# Patient Record
Sex: Male | Born: 1940
Health system: Southern US, Community
[De-identification: ages and names within clinical notes are randomized; demographics above are authoritative.]

## PROBLEM LIST (undated history)

## (undated) DIAGNOSIS — R112 Nausea with vomiting, unspecified: Secondary | ICD-10-CM

## (undated) DIAGNOSIS — J40 Bronchitis, not specified as acute or chronic: Secondary | ICD-10-CM

## (undated) DIAGNOSIS — Z950 Presence of cardiac pacemaker: Secondary | ICD-10-CM

## (undated) DIAGNOSIS — Z9889 Other specified postprocedural states: Secondary | ICD-10-CM

## (undated) DIAGNOSIS — I495 Sick sinus syndrome: Secondary | ICD-10-CM

## (undated) DIAGNOSIS — I2510 Atherosclerotic heart disease of native coronary artery without angina pectoris: Secondary | ICD-10-CM

## (undated) DIAGNOSIS — I480 Paroxysmal atrial fibrillation: Secondary | ICD-10-CM

## (undated) DIAGNOSIS — Z87440 Personal history of urinary (tract) infections: Secondary | ICD-10-CM

## (undated) DIAGNOSIS — K279 Peptic ulcer, site unspecified, unspecified as acute or chronic, without hemorrhage or perforation: Secondary | ICD-10-CM

## (undated) DIAGNOSIS — I2119 ST elevation (STEMI) myocardial infarction involving other coronary artery of inferior wall: Secondary | ICD-10-CM

## (undated) DIAGNOSIS — I429 Cardiomyopathy, unspecified: Secondary | ICD-10-CM

## (undated) DIAGNOSIS — U071 COVID-19: Secondary | ICD-10-CM

## (undated) DIAGNOSIS — I209 Angina pectoris, unspecified: Secondary | ICD-10-CM

## (undated) DIAGNOSIS — J1282 Pneumonia due to coronavirus disease 2019: Secondary | ICD-10-CM

## (undated) DIAGNOSIS — K3532 Acute appendicitis with perforation and localized peritonitis, without abscess: Secondary | ICD-10-CM

## (undated) DIAGNOSIS — I499 Cardiac arrhythmia, unspecified: Secondary | ICD-10-CM

## (undated) DIAGNOSIS — N451 Epididymitis: Secondary | ICD-10-CM

## (undated) HISTORY — DX: COVID-19: U07.1

## (undated) HISTORY — DX: Bronchitis, not specified as acute or chronic: J40

## (undated) HISTORY — DX: Cardiomyopathy, unspecified: I42.9

## (undated) HISTORY — DX: Peptic ulcer, site unspecified, unspecified as acute or chronic, without hemorrhage or perforation: K27.9

## (undated) HISTORY — PX: APPENDECTOMY: SHX54

## (undated) HISTORY — DX: Personal history of urinary (tract) infections: Z87.440

## (undated) HISTORY — DX: Sick sinus syndrome: I49.5

## (undated) HISTORY — DX: Atherosclerotic heart disease of native coronary artery without angina pectoris: I25.10

## (undated) HISTORY — DX: Pneumonia due to coronavirus disease 2019: J12.82

## (undated) HISTORY — PX: INSERT / REPLACE / REMOVE PACEMAKER: SUR710

## (undated) HISTORY — DX: ST elevation (STEMI) myocardial infarction involving other coronary artery of inferior wall: I21.19

## (undated) HISTORY — DX: Paroxysmal atrial fibrillation: I48.0

## (undated) HISTORY — DX: Epididymitis: N45.1

## (undated) HISTORY — DX: Acute appendicitis with perforation, localized peritonitis, and gangrene, without abscess: K35.32

---
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
## 2008-01-04 ENCOUNTER — Ambulatory Visit: Payer: Self-pay | Admitting: Cardiology

## 2008-01-05 ENCOUNTER — Ambulatory Visit: Payer: Self-pay | Admitting: Cardiology

## 2008-01-18 ENCOUNTER — Ambulatory Visit: Payer: Self-pay | Admitting: Cardiology

## 2008-02-20 ENCOUNTER — Ambulatory Visit: Payer: Self-pay | Admitting: Cardiology

## 2008-03-20 ENCOUNTER — Ambulatory Visit: Payer: Self-pay | Admitting: Cardiology

## 2008-03-21 ENCOUNTER — Ambulatory Visit: Payer: Self-pay | Admitting: Internal Medicine

## 2008-03-29 HISTORY — PX: PACEMAKER PLACEMENT: SHX43

## 2008-04-03 ENCOUNTER — Ambulatory Visit (HOSPITAL_COMMUNITY): Admission: RE | Admit: 2008-04-03 | Discharge: 2008-04-03 | Payer: Self-pay | Admitting: Internal Medicine

## 2008-04-03 ENCOUNTER — Ambulatory Visit: Payer: Self-pay | Admitting: Internal Medicine

## 2008-04-15 ENCOUNTER — Ambulatory Visit: Payer: Self-pay

## 2008-06-13 ENCOUNTER — Ambulatory Visit: Payer: Self-pay | Admitting: Cardiology

## 2008-08-22 ENCOUNTER — Ambulatory Visit: Payer: Self-pay | Admitting: Internal Medicine

## 2008-08-30 ENCOUNTER — Ambulatory Visit: Payer: Self-pay | Admitting: Internal Medicine

## 2008-08-30 LAB — CONVERTED CEMR LAB
ALT: 26 units/L (ref 0–53)
AST: 23 units/L (ref 0–37)
Alkaline Phosphatase: 59 units/L (ref 39–117)
Bilirubin, Direct: 0.2 mg/dL (ref 0.0–0.3)
HDL: 30.9 mg/dL — ABNORMAL LOW (ref >39.0)
Total Bilirubin: 1 mg/dL (ref 0.3–1.2)

## 2009-01-28 ENCOUNTER — Ambulatory Visit: Payer: Self-pay | Admitting: Internal Medicine

## 2009-03-10 ENCOUNTER — Encounter: Payer: Self-pay | Admitting: Internal Medicine

## 2009-03-10 ENCOUNTER — Ambulatory Visit: Payer: Self-pay | Admitting: Internal Medicine

## 2009-03-10 DIAGNOSIS — I480 Paroxysmal atrial fibrillation: Secondary | ICD-10-CM | POA: Insufficient documentation

## 2009-03-10 DIAGNOSIS — G4730 Sleep apnea, unspecified: Secondary | ICD-10-CM

## 2009-03-10 DIAGNOSIS — G4710 Hypersomnia, unspecified: Secondary | ICD-10-CM | POA: Insufficient documentation

## 2009-03-11 LAB — CONVERTED CEMR LAB
BUN: 17 mg/dL (ref 6–23)
Basophils Absolute: 0 10*3/uL (ref 0.0–0.1)
CO2: 29 meq/L (ref 19–32)
Calcium: 9.4 mg/dL (ref 8.4–10.5)
Eosinophils Absolute: 0.4 10*3/uL (ref 0.0–0.7)
Glucose, Bld: 90 mg/dL (ref 70–99)
Hemoglobin: 16.3 g/dL (ref 13.0–17.0)
Lymphocytes Relative: 20 % (ref 12.0–46.0)
Lymphs Abs: 1.6 10*3/uL (ref 0.7–4.0)
MCHC: 34.3 g/dL (ref 30.0–36.0)
Neutro Abs: 5.5 10*3/uL (ref 1.4–7.7)
Platelets: 179 10*3/uL (ref 150.0–400.0)
RDW: 13 % (ref 11.5–14.6)
Sodium: 142 meq/L (ref 135–145)
TSH: 1.22 microintl units/mL (ref 0.35–5.50)

## 2009-03-12 ENCOUNTER — Ambulatory Visit: Payer: Self-pay | Admitting: Cardiology

## 2009-03-21 ENCOUNTER — Encounter (INDEPENDENT_AMBULATORY_CARE_PROVIDER_SITE_OTHER): Payer: Self-pay | Admitting: Radiology

## 2009-04-07 ENCOUNTER — Telehealth: Payer: Self-pay | Admitting: Internal Medicine

## 2009-04-29 ENCOUNTER — Encounter (INDEPENDENT_AMBULATORY_CARE_PROVIDER_SITE_OTHER): Payer: Self-pay | Admitting: *Deleted

## 2009-04-29 ENCOUNTER — Inpatient Hospital Stay (HOSPITAL_COMMUNITY): Admission: EM | Admit: 2009-04-29 | Discharge: 2009-05-07 | Payer: Self-pay | Admitting: Internal Medicine

## 2009-04-29 ENCOUNTER — Ambulatory Visit: Payer: Self-pay | Admitting: *Deleted

## 2009-04-30 ENCOUNTER — Encounter: Payer: Self-pay | Admitting: Internal Medicine

## 2009-05-04 ENCOUNTER — Encounter: Payer: Self-pay | Admitting: Internal Medicine

## 2009-05-06 DIAGNOSIS — Z91038 Other insect allergy status: Secondary | ICD-10-CM | POA: Insufficient documentation

## 2009-05-19 ENCOUNTER — Encounter: Payer: Self-pay | Admitting: Internal Medicine

## 2009-05-20 ENCOUNTER — Ambulatory Visit: Payer: Self-pay | Admitting: Internal Medicine

## 2009-05-20 DIAGNOSIS — R002 Palpitations: Secondary | ICD-10-CM | POA: Insufficient documentation

## 2009-05-23 ENCOUNTER — Encounter: Payer: Self-pay | Admitting: Internal Medicine

## 2009-07-05 ENCOUNTER — Encounter: Payer: Self-pay | Admitting: Internal Medicine

## 2009-07-07 ENCOUNTER — Ambulatory Visit: Payer: Self-pay | Admitting: Internal Medicine

## 2009-07-07 ENCOUNTER — Encounter: Payer: Self-pay | Admitting: Internal Medicine

## 2009-07-09 LAB — CONVERTED CEMR LAB
Albumin: 4 g/dL (ref 3.5–5.2)
Bilirubin, Direct: 0.2 mg/dL (ref 0.0–0.3)
Total Protein: 7.3 g/dL (ref 6.0–8.3)

## 2009-10-14 ENCOUNTER — Ambulatory Visit: Payer: Self-pay | Admitting: Internal Medicine

## 2009-10-14 DIAGNOSIS — I495 Sick sinus syndrome: Secondary | ICD-10-CM | POA: Insufficient documentation

## 2009-10-15 LAB — CONVERTED CEMR LAB
TSH: 1.19 microintl units/mL (ref 0.35–5.50)
Total Bilirubin: 1.2 mg/dL (ref 0.3–1.2)

## 2010-04-02 ENCOUNTER — Ambulatory Visit: Payer: Self-pay

## 2010-04-02 ENCOUNTER — Encounter: Payer: Self-pay | Admitting: Internal Medicine

## 2010-06-05 ENCOUNTER — Telehealth: Payer: Self-pay | Admitting: Internal Medicine

## 2010-07-30 ENCOUNTER — Encounter: Payer: Self-pay | Admitting: Cardiology

## 2010-08-07 ENCOUNTER — Telehealth: Payer: Self-pay | Admitting: Internal Medicine

## 2010-08-19 ENCOUNTER — Ambulatory Visit (HOSPITAL_COMMUNITY): Admission: RE | Admit: 2010-08-19 | Discharge: 2010-08-19 | Payer: Self-pay | Admitting: Family Medicine

## 2010-10-16 ENCOUNTER — Encounter: Payer: Self-pay | Admitting: Internal Medicine

## 2010-10-16 ENCOUNTER — Ambulatory Visit: Payer: Self-pay | Admitting: Internal Medicine

## 2010-12-29 NOTE — Cardiovascular Report (Signed)
Summary: [facility]/ [facility] SUMMARY  [facility]/ [facility] SUMMARY   Imported By: Dorise Hiss 10/21/2010 12:22:29  _____________________________________________________________________  External Attachment:    Type:   Image     Comment:   External Document

## 2010-12-29 NOTE — Procedures (Signed)
Summary: 6 MO F/U   Current Medications (verified): 1)  Aspirin 81 Mg Tbec (Aspirin) .... Take 2 Tablets By Mouth Daily 2)  Fish Oil Concentrate 1000 Mg Caps (Omega-3 Fatty Acids) .Marland Kitchen.. 1 Once Daily 3)  Metoprolol Tartrate 25 Mg Tabs (Metoprolol Tartrate) .... Take One Tablet By Mouth Twice A Day 4)  Amiodarone Hcl 200 Mg Tabs (Amiodarone Hcl) .... Take Once Daily 5)  Cetirizine Hcl 10 Mg Tabs (Cetirizine Hcl) .... One By Mouth Daily  Allergies (verified): 1)  ! Codeine 2)  * Bee Sting   PPM Specifications Following MD:  [name], MD     PPM Vendor:  St Jude     PPM Model Number:  [id number]     PPM Serial Number:  [id number] PPM DOI:  04/03/2008     PPM Implanting MD:  [name], MD  Lead 1    Location: RA     DOI: 10/19/1999     Model #: [id number]     Serial #: [id number]     Status: active Lead 2    Location: RV     DOI: 10/19/1999     Model #: [id number]     Serial #: [id number]     Status: active  Magnet Response Rate:  BOL 98.6 ERI  86.3  Indications:  TACHY-BRADY SYNDROME   PPM Follow Up Remote Check?  No Battery Voltage:  2.79 V     Battery Est. Longevity:  4.25 years     Pacer Dependent:  No       PPM Device Measurements Atrium  Amplitude: 4.7 mV, Impedance: 416 ohms, Threshold: 0.75 V at 0.4 msec Right Ventricle  Amplitude: 10 mV, Impedance: 379 ohms, Threshold: 1.0 V at 0.4 msec  Episodes MS Episodes:  226     Percent Mode Switch:  4.8%     Coumadin:  No Atrial Pacing:  99%     Ventricular Pacing:  <1%  Parameters Mode:  DDDR     Lower Rate Limit:  75     Upper Rate Limit:  110 Paced AV Delay:  250     Sensed AV Delay:  250 Next Cardiology Appt Due:  09/29/2010 Tech Comments:  There were 226 mode switch for a total of 4.8%, the longest 6:41 hours, he is not on coumadin.  I turned is EGM storage on for mode switch so we can look at these episodes in more detail at his next visit.  He will also start to follow in the [place] office instead of here in [place].   Dr. Johney Frame will see him  back in 6 months. Altha Harm, LPN  Apr 03, 1307 10:21 AM

## 2010-12-29 NOTE — Progress Notes (Signed)
Summary: pt wants chest x-ray  Phone Note Call from Patient Call back at [phone]   Caller: Patient Reason for Call: Talk to Nurse, Talk to Doctor Summary of Call: pt has a Rx from his pcp office for a x-ray due to chest pain and wants to come here to have it done. Initial call taken by: Omer Jack,  August 07, 2010 9:31 AM  Follow-up for Phone Call        Told pt he could not have the XRAY here since the order was from his primary MD so to schedule his XRAY with [facility] in [place]. Said he was feeling dizzy and so I advised him to take his BP/HR and call us back if the results are abnomal. Made his f/u appt. with Dr. Johney Frame in [place] for Nov.18 @ 3:30pm.

## 2010-12-29 NOTE — Progress Notes (Signed)
Summary: med question/appt  Phone Note Call from Patient Call back at [phone]   Caller: Patient Reason for Call: Talk to Nurse Summary of Call: request to speak to nurse about refill on Metoprolol.... request to be seen, Dr Graciela Husbands does not have appt till 8/23, pt wants to be seen before then, his meds will run out  Initial call taken by: Migdalia Dk,  June 05, 2010 11:01 AM  Follow-up for Phone Call        has been feeling bad since 1st of June, has had some shooting pain down the left side around his breast, last a couple of sec, no pattern to when it comes, more fatigued than normal, pt states he has fired Dr Andee Lineman and only sees Dr Graciela Husbands now, Dr Graciela Husbands is ok to refill metoprolol, new rx sent in pt is agreeable to f/u w/pcp for other symptoms  Follow-up by: Meredith Staggers, RN,  June 05, 2010 11:37 AM    Prescriptions: METOPROLOL TARTRATE 25 MG TABS (METOPROLOL TARTRATE) Take one tablet by mouth twice a day  #60 x 12   Entered by:   Meredith Staggers, RN   Authorized by:   Nathen May, MD, [facility]   Signed by:   Meredith Staggers, RN on 06/05/2010   Method used:   Electronically to        [facility]* (retail)       [address]       [place], [place]  [address]       Ph: [phone]       Fax: [phone]   RxID:   [id number]

## 2010-12-29 NOTE — Assessment & Plan Note (Signed)
Summary: f68m/kl   Visit Type:  Follow-up Referring Provider:  DeGent Primary Provider:  Farrell Ours   History of Present Illness: The patient presents today for routine electrophysiology followup. He reports doing very well since last being seen in our clinic.   He is unware of episodes of afib.  The patient denies symptoms of palpitations, chest pain, shortness of breath, orthopnea, PND, lower extremity edema, dizziness, presyncope, syncope, or neurologic sequela. The patient is tolerating medications without difficulties and is otherwise without complaint today.   Preventive Screening-Counseling & Management  Alcohol-Tobacco     Smoking Status: quit     Year Quit: 11/30/1977  Current Medications (verified): 1)  Aspirin 81 Mg Tbec (Aspirin) .... Take 2 Tablets By Mouth Daily 2)  Fish Oil Concentrate 1000 Mg Caps (Omega-3 Fatty Acids) .Marland Kitchen.. 1 Once Daily 3)  Metoprolol Tartrate 25 Mg Tabs (Metoprolol Tartrate) .... Take One Tablet By Mouth Twice A Day 4)  Amiodarone Hcl 200 Mg Tabs (Amiodarone Hcl) .... Take Once Daily 5)  Multivitamins  Tabs (Multiple Vitamin) .... Take 1 Tablet By Mouth Once A Day  Allergies (verified): 1)  ! Codeine 2)  * Bee Sting  Comments:  Nurse/Medical Assistant: The patient's medication bottles and allergies were reviewed with the patient and were updated in the Medication and Allergy Lists.  Past History:  Past Medical History: Reviewed history from 05/06/2009 and no changes required.  PAST MEDICAL HISTORY:   1. 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 Jude, [place]. 5826  2. 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   3. Asthma.   4. Bronchitis history.   5. Peptic ulcer disease.   6. History of recurrent UTIs.   7. History of ruptured appendix.   8. History of acute epididymitis.   Past Surgical History: Reviewed history from 05/06/2009 and no changes required. Pacemaker implantation  Social  History: Reviewed history from 05/06/2009 and no changes required. Divorced  Tobacco Use - Former. --1979 Alcohol Use - no  Vital Signs:  Patient profile:   70 year old male Height:      71 inches Weight:      210 pounds BMI:     29.39 Pulse rate:   75 / minute BP sitting:   117 / 81  (left arm) Cuff size:   large  Vitals Entered By: Carlye Grippe (October 16, 2010 3:27 PM)  Nutrition Counseling: Patient's BMI is greater than 25 and therefore counseled on weight management options.  Physical Exam  General:  Well developed, well nourished, in no acute distress. Head:  normocephalic and atraumatic Eyes:  PERRLA/EOM intact; conjunctiva and lids normal. Mouth:  Teeth, gums and palate normal. Oral mucosa normal. Neck:  supple Lungs:  Clear bilaterally to auscultation and percussion. Heart:  RRR, no m/r/g Abdomen:  Bowel sounds positive; abdomen soft and non-tender without masses, organomegaly, or hernias noted. No hepatosplenomegaly. Msk:  Back normal, normal gait. Muscle strength and tone normal. Pulses:  pulses normal in all 4 extremities Extremities:  No clubbing or cyanosis. Neurologic:  Alert and oriented x 3.   PPM Specifications Following MD:  [name], MD     PPM Vendor:  St Jude     PPM Model Number:  [id number]     PPM Serial Number:  [id number] PPM DOI:  04/03/2008     PPM Implanting MD:  [name], MD  Lead 1    Location: RA  DOI: 10/19/1999     Model #: 1388TC     Serial #: [id number]     Status: active Lead 2    Location: RV     DOI: 10/19/1999     Model #: [id number]     Serial #: [id number]     Status: active  Magnet Response Rate:  BOL 98.6 ERI  86.3  Indications:  TACHY-BRADY SYNDROME   PPM Follow Up Battery Voltage:  2.79 V     Battery Est. Longevity:  3-4.25 YRS     Pacer Dependent:  No       PPM Device Measurements Atrium  Amplitude: 5.0 mV, Impedance: 381 ohms, Threshold: 0.75 V at 0.4 msec Right Ventricle  Amplitude: 10.3 mV, Impedance: 379 ohms,  Threshold: 1.125 V at 0.4 msec  Episodes MS Episodes:  676     Percent Mode Switch:  8.4%     Coumadin:  No Ventricular High Rate:  4     Atrial Pacing:  99%     Ventricular Pacing:  <1%  Parameters Mode:  DDDR     Lower Rate Limit:  75     Upper Rate Limit:  110 Paced AV Delay:  250     Sensed AV Delay:  250 Next Cardiology Appt Due:  03/30/2011 Tech Comments:  676 AMS EPISODES--LONGEST WAS 7 HRS 22 MINUTES.  NORMAL DEVICE FUNCTION. NO CHANGES MADE. ROV IN 6 MTHS W/DEVICE CLINIC. Vella Kohler  October 16, 2010 3:42 PM MD Comments:  agree  Impression & Recommendations:  Problem # 1:  ATRIAL FIBRILLATION (ICD-427.31) well controlled with amiodarone He is instructed to have TFTs and LFTs every 6 months by Dr Margo Common (he declines labs today stating he has had recent labs by Dr Margo Common). CXR 9/11 was stable  His CHADS2 score is 0.  He will continue ASA for stroke prevention.  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

## 2011-01-01 NOTE — Cardiovascular Report (Signed)
Summary: Office Visit   Office Visit   Imported By: Roderic Ovens 04/14/2010 12:01:49  _____________________________________________________________________  External Attachment:    Type:   Image     Comment:   External Document

## 2011-03-08 ENCOUNTER — Other Ambulatory Visit: Payer: Self-pay | Admitting: Internal Medicine

## 2011-03-08 LAB — URINALYSIS, ROUTINE W REFLEX MICROSCOPIC
Bilirubin Urine: NEGATIVE
Ketones, ur: 15 mg/dL — AB
Protein, ur: NEGATIVE mg/dL
Urobilinogen, UA: 1 mg/dL (ref 0.0–1.0)

## 2011-03-08 LAB — CBC
HCT: 38.7 % — ABNORMAL LOW (ref 39.0–52.0)
HCT: 40.2 % (ref 39.0–52.0)
HCT: 40.8 % (ref 39.0–52.0)
Hemoglobin: 13.3 g/dL (ref 13.0–17.0)
Hemoglobin: 13.4 g/dL (ref 13.0–17.0)
Hemoglobin: 13.9 g/dL (ref 13.0–17.0)
Hemoglobin: 14 g/dL (ref 13.0–17.0)
Hemoglobin: 14.1 g/dL (ref 13.0–17.0)
MCHC: 34.2 g/dL (ref 30.0–36.0)
MCHC: 34.3 g/dL (ref 30.0–36.0)
MCHC: 34.4 g/dL (ref 30.0–36.0)
MCHC: 34.6 g/dL (ref 30.0–36.0)
MCV: 87.8 fL (ref 78.0–100.0)
MCV: 88.4 fL (ref 78.0–100.0)
MCV: 88.9 fL (ref 78.0–100.0)
MCV: 90.5 fL (ref 78.0–100.0)
Platelets: 133 10*3/uL — ABNORMAL LOW (ref 150–400)
Platelets: 210 10*3/uL (ref 150–400)
Platelets: 266 10*3/uL (ref 150–400)
RBC: 4.36 MIL/uL (ref 4.22–5.81)
RBC: 4.38 MIL/uL (ref 4.22–5.81)
RBC: 4.4 MIL/uL (ref 4.22–5.81)
RBC: 4.45 MIL/uL (ref 4.22–5.81)
RBC: 4.59 MIL/uL (ref 4.22–5.81)
RBC: 4.61 MIL/uL (ref 4.22–5.81)
RBC: 4.64 MIL/uL (ref 4.22–5.81)
RDW: 14.1 % (ref 11.5–15.5)
RDW: 14.1 % (ref 11.5–15.5)
WBC: 10.6 10*3/uL — ABNORMAL HIGH (ref 4.0–10.5)
WBC: 16.4 10*3/uL — ABNORMAL HIGH (ref 4.0–10.5)
WBC: 5.6 10*3/uL (ref 4.0–10.5)
WBC: 6.4 10*3/uL (ref 4.0–10.5)
WBC: 7.2 10*3/uL (ref 4.0–10.5)

## 2011-03-08 LAB — URINE CULTURE
Colony Count: NO GROWTH
Culture: NO GROWTH

## 2011-03-08 LAB — LIPID PANEL: Triglycerides: 56 mg/dL (ref <150)

## 2011-03-08 LAB — CARDIAC PANEL(CRET KIN+CKTOT+MB+TROPI)
Total CK: 267 U/L — ABNORMAL HIGH (ref 7–232)
Troponin I: 0.05 ng/mL (ref 0.00–0.06)

## 2011-03-08 LAB — HEPARIN LEVEL (UNFRACTIONATED)
Heparin Unfractionated: 0.17 IU/mL — ABNORMAL LOW (ref 0.30–0.70)
Heparin Unfractionated: 0.23 IU/mL — ABNORMAL LOW (ref 0.30–0.70)
Heparin Unfractionated: 0.46 IU/mL (ref 0.30–0.70)
Heparin Unfractionated: 0.56 IU/mL (ref 0.30–0.70)
Heparin Unfractionated: 0.59 IU/mL (ref 0.30–0.70)
Heparin Unfractionated: 0.64 IU/mL (ref 0.30–0.70)
Heparin Unfractionated: 0.82 IU/mL — ABNORMAL HIGH (ref 0.30–0.70)

## 2011-03-08 LAB — PSA: PSA: 43.2 ng/mL — ABNORMAL HIGH (ref 0.10–4.00)

## 2011-03-08 LAB — BASIC METABOLIC PANEL
BUN: 9 mg/dL (ref 6–23)
Chloride: 109 mEq/L (ref 96–112)
Creatinine, Ser: 0.97 mg/dL (ref 0.4–1.5)
Creatinine, Ser: 1.29 mg/dL (ref 0.4–1.5)
GFR calc Af Amer: >60 mL/min (ref >60)
GFR calc Af Amer: >60 mL/min (ref >60)
GFR calc non Af Amer: >60 mL/min (ref >60)
Potassium: 3.8 mEq/L (ref 3.5–5.1)
Potassium: 3.8 mEq/L (ref 3.5–5.1)

## 2011-03-08 LAB — URINE MICROSCOPIC-ADD ON

## 2011-03-08 LAB — GLUCOSE, CAPILLARY: Glucose-Capillary: 113 mg/dL — ABNORMAL HIGH (ref 70–99)

## 2011-03-08 LAB — TSH: TSH: 2.118 u[IU]/mL (ref 0.350–4.500)

## 2011-04-09 ENCOUNTER — Ambulatory Visit (INDEPENDENT_AMBULATORY_CARE_PROVIDER_SITE_OTHER): Payer: Medicare PPO | Admitting: *Deleted

## 2011-04-09 DIAGNOSIS — I495 Sick sinus syndrome: Secondary | ICD-10-CM

## 2011-04-09 DIAGNOSIS — I4891 Unspecified atrial fibrillation: Secondary | ICD-10-CM

## 2011-04-09 DIAGNOSIS — R002 Palpitations: Secondary | ICD-10-CM

## 2011-04-13 NOTE — Assessment & Plan Note (Signed)
[facility]                          [facility] NOTE   [name], [name]                      MRN:          [id number]  DATE:01/04/2008                            DOB:          [birth year]    REFERRING PHYSICIAN:  NONE   Patient does not have a primary care physician in [place] yet.   REASON FOR OFFICE VISIT:  Establish with new cardiologist.   HISTORY OF PRESENT ILLNESS:  Patient is a 70 year old male with no prior  history of coronary artery disease, but with known sick sinus syndrome,  status post tachy/brady syndrome.  The patient is status post pacemaker  implantation with a St. Jude device.  This is an AV sequential  pacemaker.  The pacemaker was implanted in approximately the year 2000.  Unfortunately, the patient still complains of frequent episodes of  atrial fibrillation.  He states that he has been under quite a bit of  stress and this has triggered irregular heartbeats.  He has associated  shortness of breath and fatigue.  He has previous episodes of substernal  chest pain, which only lasts seconds.  He states that his episodes of  atrial fibrillation are frequent and are manifested by irregular  heartbeats and a panic sensation, sometimes lasting several hours.  The  patient was also told, in November of 2008, that he is heading towards  end of battery life of his pacemaker at approximately four months.   ALLERGIES:  No known drug allergies.   MEDICATIONS:  1. [place]. [place] aspirin.  2. Omega fish oil.  3. Warfarin.  4. Cartia-XT 80 mg p.o. daily.   FAMILY HISTORY:  Mother died at age 85, suicide by drowning.  Father  died in his seventies, secondary to cancer.   SOCIAL HISTORY:  The patient is divorced.  [place]  He does not smoke or drink.  He states that  he quit smoking on November 30, 1977, at 2 o'clock on Tuesday.   PAST MEDICAL HISTORY:  See above and below.  Also  ruptured appendix,  acute epididymitis.   REVIEW OF SYSTEMS:  As per HPI, no nausea or vomiting, no fever or  chills, no melena, hematochezia, no dysuria or frequency.  Positive for  palpitations, but no syncope, no neurological symptoms.   PHYSICAL EXAMINATION:  VITAL SIGNS:  Blood pressure is 126/86, heart  rate 79 beats per minute.  NECK EXAM:  Normal carotid upstroke, no carotid bruits.  LUNGS:  Clear breath sounds bilaterally.  HEART:  Regular rate and rhythm.  Normal S1, S2, no murmurs or gallops.  ABDOMEN:  Soft, nontender, no rebound or guarding, good bowel sounds.  EXTREMITY EXAM:  No cyanosis, clubbing or edema.  NEUROLOGIC:  Patient is alert and oriented and grossly nonfocal.   EKG:  Without magnet.  Atrial pacing with intrinsic ventricular rhythm  in a left bundle branch pattern.  AV sequential pacing at a magnet rate  of 91 beats per minute with the magnet.   PROBLEM LIST:  1. Sick sinus syndrome.  a.     Status post dual-chamber pacemaker implantation with St.       Jude device.      b.     Recurrent tachy palpitations, likely secondary to atrial       fibrillation.  2. History of substernal chest pain, status post prior cardiac      catheterization, negative for significant CAD.      a.     Cardiolite study, one year ago, negative for ischemia.   PLAN:  1. The patient is stable froma  cardiovascular perspective.  He has      chest pain which is very atypical and unlikely to represent      ischemia.  Given the fact that he had a stress test done a year      ago, I do not think any further ischemia workup is needed.  2. The patient will need a formal pacemaker interrogation, which will      be done early next week, and he will have a St. Jude interrogative      device.  3. Due to patient's recurrent palpitations, I have started him on      atenolol 50 mg p.o. daily, in addition to his Cardizem.  If he has      evidence of mode switches and evidence of frequent  atrial      fibrillation, consideration can be given to anti-arrhythmic agent.     Learta Codding, MD,FACC  Electronically Signed    GED/MedQ  DD: 01/04/2008  DT: 01/05/2008  Job #: [id number]

## 2011-04-13 NOTE — Letter (Signed)
March 21, 2008    Learta Codding, MD,FACC  [address]  [place], [place] [address]   RE:  DAILY, CRATE  MRN:  [id number]  /  DOB:  1941-08-30   Dear Michelle Piper:   Thank you very much for asking [place] to see [name] in consultation.  We finally got to see each other after 24 hours of my being delayed.   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  He  had a pacemaker implanted about 10 years ago by Dr. Verita Schneiders down at  [facility] and was living in [place] a couple years ago where the diagnosis  of atrial fibrillation was made.  At that time Coumadin was initiated  and [place] was initiated.  He had been on a beta-blocker up until that  time, which was ineffective in some way, either a rate or rhythm.  I do  not know.  With the initiation of the calcium blocker he began to have  florid, vivid dreams which were quite discomfiting.  Because of ongoing  problems with symptoms when you saw him, he resumed his atenolol and  this has been associated with a marked improvement in his overall sense  of well-being, including improved sleep, decreased dream disturbances,  as well as less atrial fibrillation.   His thromboembolic risk factors are broadly negative, with a CHADS score  of 0.   His last Myoview scan was a number of years ago.  He does not have any  recollection as to what is his ejection fraction.   He does not have orthopnea, peripheral edema or nocturnal dyspnea.   His review of systems is broadly negative.  Review of systems is notable  for:  1. Asthma and bronchitis.  2. Peptic ulcer disease.  3. An achy kidney which is ameliorated by significant fluid      hydration and is otherwise broadly negative across multiple organ      systems.   SOCIAL HISTORY:  Is as noted previously.  He does not use cigarettes,  alcohol or recreational drugs.   His medications include aspirin 81, Cartia 180, atenolol 50, and  warfarin.   ALLERGIES:  HE IS ALLERGIC TO CODEINE.   On examination he is an older middle-aged Caucasian male appearing his  stated age.  He is in no acute distress.  His blood pressure is 131/87 with a pulse of 69.  His weight was 200.  HEENT EXAM:  Demonstrated no icterus or xanthoma.  His neck veins were flat.  His carotids were brisk and full bilaterally  without bruits.  BACK:  Without kyphosis or scoliosis.  His lungs were clear.  Heart sounds were regular without murmurs or gallops.  ABDOMEN:  Soft with active bowel sounds without midline pulsation or  hepatomegaly.  Femoral pulses were 2+, distal pulses were intact.  There was no  clubbing, cyanosis or edema.  NEUROLOGICAL EXAM:  Grossly normal.  His skin was warm and dry.   Interrogation of his St. Jude Identity pulse generator demonstrates that  he is at [employer].  We know that he was not at [facility] in on February 20, 2008; so  at some time in the interval.   IMPRESSION:  1. Tachybrady syndrome with:      a.  Atrial fibrillation.      b.     Previously implanted St. Jude pulse generator now at       elective replacement.  2. Thromboembolic risk factors:  CHADS score of 0.  3. Sleep disturbances are related to [place].  4. Ongoing problems with atrial fibrillation.  5. ?Ejection fraction.   Michelle Piper, Mr. Tome has a previously implanted pacemaker now at end of  life.  It is time for generator replacement.  I have reviewed with the  patient the potential benefits as well as potential risks, including  primarily related to infection.  He understands these risks and is  willing to proceed.  I should note that he would anticipate driving  himself to the hospital and so it would be important that he not receive  sedation as that would preclude him from driving home.   As it relates to his medications and his atrial fibrillation,  your  addition of atenolol has been a huge success for him.  Given the ongoing  problems, however, I have suggested that we increase it further from 50  to 100 mg a day and we will see how he does.  It may be appropriate to  discontinue his Cartia given its effect on his sleep in the past.  We  discussed the potential role of antiarrhythmic drug therapy including  the risks for pro-arrhythmia.  We will defer at this point initiation of  such medications.   The last issue was whether he should be on Coumadin with his CHADS score  of 0.  I have reviewed with him current guidelines.  In the absence of  having data specifically to the contrary, it has been my recommendation  to him that he discontinue his Coumadin and that he increase his aspirin  to 162-325 mg a day.  He is in agreement with this.   We will plan to schedule with him tomorrow a time in the near future for  device generator replacement.   Thank you for this consultation.    Sincerely,      Duke Salvia, MD, [facility]  Electronically Signed    SCK/MedQ  DD: 03/21/2008  DT: 03/21/2008  Job #: [id number]

## 2011-04-13 NOTE — Discharge Summary (Signed)
NAMEWILBERTO, [name] NO.:  [id number]   MEDICAL RECORD NO.:  [id number]          PATIENT TYPE:  INP   LOCATION:  4739                         FACILITY:  [facility]   PHYSICIAN:  Duke Salvia, MD, FACCDATE OF BIRTH:  06-30-1941   DATE OF ADMISSION:  04/29/2009  DATE OF DISCHARGE:  05/07/2009                               DISCHARGE SUMMARY   This patient has allergies to CODEINE and BEE STINGS.   TIME FOR THIS DICTATION EXAMINATION AND EXPLANATION OF DISCHARGE TO THE  PATIENT:  Greater than 50 minutes.   FINAL DIAGNOSES:  His admission from April 29, 2009 to May 07, 2009.  The  first concerned is the cardiovascular system:  1. The patient presents in atrial fibrillation, rapid ventricular rate      with symptoms of dyspnea, fatigue, and palpitation.      a.     Flecainide started 300 mg on the morning of April 30, 2009.      b.     Direct current cardioversion at 4:30 p.m., April 30, 2009.      c.     Reverted to atrial fibrillation in the overnight from April 30, 2009 to May 01, 2009.      d.     Direct current cardioversion on May 02, 2009 with early       return to atrial fibrillation.      e.     Started IV amiodarone on May 03, 2009.      f.     Started oral amiodarone 4 mg every 8 hours on May 04, 2009       and by discharge, the patient had 3.6 g of load.  He did have a       pharmacologic conversion to sinus rhythm in the overnight on May 03, 2009 to May 04, 2009 and has maintained sinus rhythm.  The       patient will discharge on amiodarone.   The second parallel track is urologic track.  He had urinary tract  infection on admission, although culture on April 29, 2009 proved to be  negative.  He did have dysuria secondary to prostatitis.  His PSA on  May 02, 2009 was 43.20.  He did have antibiotic started on April 29, 2009  and these will continue for the full course until May 13, 2009.  He was  seen by Urology, started on Flomax, Avodart, Mobic for  inflammation and  he will follow up with Urology as an outpatient.  Overall, the aim of  this admission was to get the patient in the sinus rhythm and this has  been achieved and also to ease the dysuria that the patient is having  from an acute prostatitis.   SECONDARY DIAGNOSES:  1. 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  2. Asthma.  3. History of bronchitis.  4. Peptic ulcer disease.  5. History of recurrent urinary tract infection.  6.  Ruptured appendix in the past.  7. History of pain and epididymitis.   PROCEDURES THIS ADMISSION:  As dictated above.  The patient had direct  current cardioversion on the afternoon of April 30, 2009 and then repeat  direct current cardioversion on May 02, 2009.  Both of these were  ineffective.  The patient then converted spontaneously to sinus rhythm  after being started on amiodarone therapy.   BRIEF HISTORY:  [name] is a 70 year old male.  He has a history of  tachybrady syndrome.  He has a pacemaker.  He presents with atrial  fibrillation, rapid ventricular response.   The patient has been feeling weak and fatigued with palpitations.  He  has had this feeling several times in the past and he started having  symptoms of frequency, urgency, and dysuria.  He came to the Emergency  Department.  He was found to be in atrial fibrillation and rapid  ventricular rate.  He denies chest pain, presyncope, edema, orthopnea,  or PND.  The patient was transferred to [facility] for further  evaluation and care.  Once again, his cardiac track has been dictated  above.  He had cardioversions with flecainide on April 30, 2009.  This did  not work.  He had a repeat cardioversion after 72 hours of flecainide on  May 02, 2009.  This also did not keep him in sinus rhythm.  He then  underwent initiation of amiodarone therapy and converted spontaneously  to sinus rhythm and has maintained sinus  rhythm.  The patient is  discharging after 72 hours of monitoring on amiodarone and goes home  with about 3.6 g loaded.  He also has been seen this admission by  [facility], has been placed on Flomax, Avodart, Mobic, and  ciprofloxacin with some improvement in his dysuria.   His discharge medications are as follows:  1. Amiodarone 200 mg, a new medication, two tablets in the morning and      two tablets in the evening from May 07, 2009 until May 21, 2009,      then starting amiodarone 200 mg two tablets daily, Thursday, May 22, 2009.  2. Avodart 0.5 mg daily.  This is new medication.  3. Tamsulosin or Flomax 0.4 mg daily at bedtime.  4. Ciprofloxacin 500 mg one tablet three times daily for 6 days more.      He was asked to take it 2 hours before meals.  5. Mobic 15 mg daily for 30-day course.  6. Metoprolol tartrate 25 mg twice daily.  This is also new, it takes      a place of atenolol.  7. Enteric-coated aspirin 81 mg daily.  8. Fish oil capsule 1000 mg twice daily.  9. Zantac 150 mg daily.   He is to stop diltiazem, stop atenolol and stop doxycycline.  He has an  appointment with Dr. Graciela Husbands, Thursday, May 08, 2009.  This has been  cancelled.   FOLLOWUP:  1. [facility] at [facility], Dr. Patsi Sears,      Wednesday, May 28, 2009 at 11:15.  2. Pulmonary function study, Tuesday, June 03, 2009 at 11 o'clock,      [facility].  This is across the street from [employer].  3. Follow up with [facility], [address] to see      Dr. Graciela Husbands, Thursday, June 12, 2009 at 9:45.  Blood work will be  taken.  4. [facility], July 30, 2009, Wednesday.  He already has this      appointment scheduled.   LABORATORY STUDIES THIS ADMISSION:  TSH is 2.118, total cholesterol 150,  triglycerides 56, LDL cholesterol is 114, HDL cholesterol 25.  His  urinalysis culture shows no growth.  PSA May 02, 2009, 43.2.  This  admission complete  blood count; white cells 4.8, hemoglobin 14,  hematocrit 40.4, platelets of 163.  Sodium 139, potassium 3.8, chloride  109, carbonate 23, glucose 94, BUN is 19, creatinine 1.29.      [name], [place]      Duke Salvia, MD, [facility]  Electronically Signed    GM/MEDQ  D:  05/07/2009  T:  05/08/2009  Job:  [id number]   cc:   Lynelle Smoke I. Patsi Sears, M.D.

## 2011-04-13 NOTE — Consult Note (Signed)
NAMEKINNIE, KAUPP               ACCOUNT NO.:  [id number]   MEDICAL RECORD NO.:  [id number]          PATIENT TYPE:  INP   LOCATION:  2101                         FACILITY:  [facility]   PHYSICIAN:  Sigmund I. Patsi Sears, M.D.DATE OF BIRTH:  1941-09-10   DATE OF CONSULTATION:  04/30/2009  DATE OF DISCHARGE:                                 CONSULTATION   CHIEF COMPLAINT:  Urosepsis.   HISTORY OF PRESENT ILLNESS:  This is a 70 year old white male that  presented to the emergency room with chief complaint of palpitations.  He does have a history of tachy-brady syndrome status post pacemaker  that presents with atrial fibrillation with rapid ventricular response.  The patient states that he began feeling weak and fatigued on Saturday  with palpitations and that these would resolve at night.  He has had  these in the past and thinks he usually reverts back to sinus rhythm.  He began having symptoms of an UTI approximately 3 days ago with  urgency, frequency, and dysuria which was causing worsening of his  palpitations and worsening of his fatigue.  He does deny hematuria with  x3 days.   PAST MEDICAL HISTORY:  1. Pacemaker placed in 2009.  2. 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  3. Asthma.  4. Bronchitis.  5. Peptic ulcer disease.  6. History of recurrent UTIs.  7. History of ruptured appendix.  8. History of acute epididymitis.   ALLERGIES:  CODEINE and BEE STINGS.   MEDICATIONS AT ADMISSION:  1. Aspirin 81 mg daily.  2. Diltiazem ER 180 mg daily.  3. Atenolol 50 mg half a tablet p.o. daily.  4. Doxycycline 100 mg p.o. b.i.d.  5. Fish oil 1000 mg p.o. b.i.d.   SOCIAL HISTORY:  He is divorced.  He lives alone.  He denies alcohol and  he has a history of remote tobacco use, last smoked in 1979.   FAMILY HISTORY:  Mother died of suicide and father died in his 30s of  cancer.   REVIEW OF SYSTEMS:  Significant for low-grade fever,  dysuria, urgency,  frequency, nocturia, generalized fatigue, and poor appetite.  All other  systems were reviewed with the patient and were otherwise negative.   PHYSICAL EXAMINATION:  VITAL SIGNS:  He was admitted with a max  temperature of 99.6, pulse rate at this time is 118, and blood pressure  is 123/81.  GENERAL:  He is alert, oriented, thin white male in no acute distress.  HEENT:  Normocephalic and atraumatic.  Pupils equal and reactive to  light.  NECK:  Supple without lymphadenopathy or JVD.  HEART:  Irregularly irregular without murmurs, rubs, or gallops.  He is  in a tachy rhythm at this time.  Pulses are bilateral and equal.  LUNGS:  Clear to auscultation.  SKIN:  Normal to inspection.  ABDOMEN:  Soft and nontender with positive bowel sounds.  No CVA  tenderness.  EXTREMITIES:  No cyanosis, clubbing, or edema.  MUSCULOSKELETAL:  No deformities.  NEUROLOGIC:  He is alert and oriented x3, pleasant, and  cooperative.   LABORATORY DATA:  Urine culture this time shows no growth.  Admission  CBC shows WBC was 16.4, today WBC is down to 10.6, hemoglobin is 13.4,  hematocrit 39.3, and platelets 149.  BMET on admission was sodium 139,  potassium 3.8, chloride 109, CO2 is 23, BUN is 19, creatinine is 1.29,  and glucose 94.   ASSESSMENT AND PLAN:  1. Prostatitis.  We will continue Levaquin 500 mg one p.o. daily x10      days.  2. Benign prostatic hypertrophy with obstruction.  We will get a renal      ultrasound to evaluate for possible bilateral hydronephrosis.      Tamsulosin 0.4 mg one p.o. day and Avodart 0.5 mg one p.o. per day.   FOLLOWUP:  At discharge, the patient will need to followup with Urology.  The patient states he would like to follow up with an urologist closer  to home in either [place] or [place].  If he changes his mind, we will  be glad to follow this patient in our office, may see him myself or Dr.  Patsi Sears for followup.   Thank you for this consult.   Please contact us in the future if our  assistance is required further.      Jetta Lout, NP      Sigmund I. Patsi Sears, M.D.  Electronically Signed    DW/MEDQ  D:  04/30/2009  T:  05/01/2009  Job:  [id number]

## 2011-04-13 NOTE — H&P (Signed)
NAMETOVIA, KISNER NO.:  [id number]   MEDICAL RECORD NO.:  [id number]          PATIENT TYPE:  INP   LOCATION:  2101                         FACILITY:  [facility]   PHYSICIAN:  Unice Cobble, MD     DATE OF BIRTH:  1941/07/07   DATE OF ADMISSION:  04/29/2009  DATE OF DISCHARGE:                              HISTORY & PHYSICAL   CARDIOLOGIST:  Duke Salvia, MD, [facility]   CHIEF COMPLAINTS:  Palpitations.   HISTORY OF PRESENT ILLNESS:  This is a 70 year old white male with a  history of tachy-brady syndrome status post pacemaker who presents with  atrial fibrillation with rapid ventricular response.  The patient had  started feeling weak and fatigued on Saturday with palpitations, but  these would go away at night.  He tells me that he has had this several  times in the past, and he can tell at night that he thinks he is in  sinus rhythm.  He also then started having symptoms of UTI with  frequency, urgency and dysuria with worsening palpitations and worsening  fatigue.  This drove him to the emergency department.  He denies chest  pain, presyncope, edema, orthopnea, PND.  He was found to be in atrial  fibrillation with RVR at [facility] and transferred here for further  cardiac care.  During transport, the patient received two boluses of  amiodarone secondary to what was perceived as ventricular tachycardia.  In retrospect, this was likely paced rhythm.   PAST MEDICAL HISTORY:  1. 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 Jude, [place].  2. 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  3. Asthma.  4. Bronchitis history.  5. Peptic ulcer disease.  6. History of recurrent UTIs.  7. History of ruptured appendix.  8. History of acute epididymitis.   ALLERGIES:  CODEINE AND BEES.   MEDICATIONS:  1. Aspirin 81 mg daily.  2. Diltiazem ER 180 mg daily.  3. Atenolol 50 mg 1/2 tablet daily.  4. Doxycycline 100 mg b.i.d. (new  prescription).  5. Fish oil 1000 mg p.o. b.i.d.   SOCIAL HISTORY:  He is divorced.  He quit smoking in 1979.  No alcohol.   FAMILY HISTORY:  Mother died at the age of 13 by drowning herself by  suicide.  Father died in his 36s of cancer.   REVIEW OF SYSTEMS:  The patient has had fevers, chills and sweats.  His  appetite has been poor.  He also has had  bug bites from bed bugs on his  legs, back, chest, and scrotum.  Otherwise, complete review of systems  was done and found to be negative except as stated in the HPI and above.   PHYSICAL EXAMINATION:  VITAL SIGNS:  Temperature is 99.6 with a pulse of  118, respiratory rate is 13, blood pressure 123/81, O2 sats 100% on  Ventimask.  GENERAL:  He is in no acute distress.  Thin.  HEENT:  Shows PERRLA, EOMI, MMM, oropharynx without erythema or  exudates.  NECK:  Supple without lymphadenopathy, thyromegaly,  bruits or jugular  venous distention.  HEART:  Irregularly irregular without murmurs, rubs or gallops.  Pulses  2+ and equal bilaterally without bruits.  LUNGS:  Clear to auscultation bilaterally.  SKIN:  Multiple bug bites.  ABDOMEN:  Soft and nontender with normal bowel sounds.  No rebound or  guarding.  EXTREMITIES:  Show no cyanosis, clubbing or edema.  MUSCULOSKELETAL:  Shows no joint deformity or effusions.  No spine or  CVA tenderness.  NEUROLOGICAL:  He is alert and oriented x3 with cranial nerves II-XII  grossly intact.  Strength is 5/5 in all extremities and axial groups  with normal sensation throughout.   STUDIES:  Rate is 141 with atrial fibrillation with PVCs and PACs which  appear to be paced PVCs and PACs.  His labs show a white count of 19.3  with a hemoglobin of 15.6.  His creatinine is 1.4 with a potassium of  4.2.  D-dimers 2.37.  BNP is 488.  CK-MB is 1.2.  He has moderate blood,  leukocyte esterase and positive nitrates with a white blood cells too  numerous to count in his urine.   ASSESSMENT/PLAN:  This is  a 70 year old white male with a history of  tachy-brady syndrome who presents with atrial fibrillation and rapid  ventricular response in the setting of a urinary tract infection.  1. Urinary tract infection.  Check urine cultures.  This has already      been done at [facility]; I would like to repeat them here.  Levaquin      has been given at 9:00 p.m. at [facility], and I will continue this      trend as it is a 5-day course for urinary tract clearance.  2. Atrial fibrillation with rapid ventricular response.  This is      driven by his urinary tract infection.  His D-dimer is high, but      this is unlikely to be a pulmonary embolism as he is asymptomatic      from this standpoint and is more likely to be in acute phase      reactant response to his UTI.  TSH will be checked.  His rate will      attempt to be controlled with diltiazem drip, and I will      anticoagulate him for possible thrombus in the left atrial      appendage.  He may need long-term anticoagulation after this.  He      also may perhaps benefit from long term UTI prophylaxis with      antibiotics.  3. Increased brain natriuretic peptide.  This is likely strain in the      setting of atrial fibrillation with rapid ventricular rate,      although I cannot rule out new-onset heart failure.  An      echocardiogram will be checked to evaluate his left ventricular      function.  4. Pacemaker.  He does have appropriate pacer spikes when his atrial      fibrillation does not conduct through his AV node.  I believe it is      likely operating normally.      Unice Cobble, MD  Electronically Signed     ACJ/MEDQ  D:  04/29/2009  T:  04/29/2009  Job:  [id number]   cc:   Duke Salvia, MD, [facility]

## 2011-04-13 NOTE — Op Note (Signed)
NAMEKHALI, ALBANESE NO.:  [id number]   MEDICAL RECORD NO.:  [id number]          PATIENT TYPE:  INP   LOCATION:  2101                         FACILITY[facility]  [facility]   PHYSICIAN:  Duke Salvia, MD, FACCDATE OF BIRTH:  September 17, 1941   DATE OF PROCEDURE:  04/30/2009  DATE OF DISCHARGE:                               OPERATIVE REPORT   PREOPERATIVE DIAGNOSIS:  Atrial fibrillation/flutter.   POSTOPERATIVE DIAGNOSIS:  Sinus rhythm.   PROCEDURES:  Direct current cardioversion.   Following obtaining informed consent, the patient was given Versed and  fentanyl under my direction.  With deep sedation, a 200-joule shock was  delivered synchronously in AP direction terminating atrial flutter and  restoring sinus rhythm.  During  the first 2-3 minutes, frequent PACs  were seen and then the rhythm settled down to an atrial-paced rhythm.  The patient tolerated the procedure without apparent complication.      Duke Salvia, MD, [facility]  Electronically Signed     SCK/MEDQ  D:  04/30/2009  T:  05/01/2009  Job:  [id number]

## 2011-04-13 NOTE — Discharge Summary (Signed)
[name], [name] NO.:  [id number]   MEDICAL RECORD NO.:  [id number]           PATIENT TYPE:   LOCATION:                                 FACILITY:   PHYSICIAN:  Cameron Mirza, PA   DATE OF BIRTH:  12/06/1940   DATE OF ADMISSION:  04/03/2008  DATE OF DISCHARGE:  04/03/2008                               DISCHARGE SUMMARY   This patient has allergy to CODEINE.   FINAL DIAGNOSES:  1. Pacemaker implanted in the past for tachybrady syndrome.      a.     Paroxysmal atrial fibrillation      b.     Pacer of about 10 years ago, now at [facility].  2. Generator change, Apr 03, 2008, with implant of a St. Jude Zephyr XL      DR dual-chamber pacemaker, Dr. [name].  3. Atenolol increased from 50 mg daily to 100 mg daily.  4. The patient has sleep disturbance with Cartia.   Question:  Possible discontinuance of the calcium channel blocker?   PROCEDURE:  Apr 03, 2008, explant of existing St. Jude identity dual-  chamber device with implant of a St. Jude Zyphyr XL DR dual-chamber  pacemaker, Dr. [name].   BRIEF HISTORY:  [name] is a 70 year old male.  He had a pacemaker  implanted about 10 years ago for tachybrady syndrome in the setting of  atrial fibrillation.  He had Cartia and Coumadin started at that time.  The patient did have florid vivid dreams on calcium channel blocker.  He  resumes his atenolol and this has been associated with improvement in  his symptoms.  His overall sense of well being plus improved sleep and  decreased dream disturbances.  In addition, he had less atrial  fibrillation.  The patient's last stress test was a number of years ago.   Past medical history also includes asthma, bronchitis, and peptic ulcer  disease.  His device is at elective replacement indicator.  The benefits  and risks have been described to the patient.  He wishes to proceed.   HOSPITAL COURSE:  The patient presents electively on Apr 03, 2008.  He  underwent  generator change with replacement without any significant  problem, discharged on the same day.  He was asked to remove his bandage  on the morning of Apr 04, 2008, and leave the incision open to the air.  He is to keep the incision dry for the next 7 days and to sponge bathe  until Wednesday, Apr 10, 2008.  He goes home with a prescription for  atenolol 50 mg twice daily.  He is still on Cartia XT 180 mg daily.  He  is also on enteric-coated aspirin 162 mg daily.  He follows up incision  care at the [facility], [facility] [address]  in [place], Monday, Apr 15, 2008, at 10 o'clock.  I am trying to  contact Dr. Margarita Mail office to see if there is any followup there.  Once  again there was some question that the patient's Cartia could be  discontinued at sometime in the future leaving him on a beta-blocker;  however, he does have a history of asthma and bronchitis.   LABORATORY STUDIES PERTINENT TO THIS ADMISSION:  The pro time is 15.9,  INR 1.2, white cells 6.7, hemoglobin 16.2, hematocrit 47.3, and  platelets 206.  Sodium is 141, potassium 4.5, chloride 107, carbonate  27, the BUN is 14, and the creatinine is 1.      Cameron Mirza, PA     GM/MEDQ  D:  04/03/2008  T:  04/04/2008  Job:  [id number]   cc:   Learta Codding, MD,FACC  Duke Salvia, MD, [facility]

## 2011-04-13 NOTE — Op Note (Signed)
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   PROCEDURE:  Explantation of a previously implanted device, implantation  of a new device, fluoroscopic imaging of the pocket and pocket revision.   Following obtaining informed consent, the patient was brought to the  electrophysiology laboratory and placed on the fluoroscopic table in  supine position.  After routine prep and drape of the left upper chest,  lidocaine was infiltrated in the prepectoral subclavicular region along  the line of the previous incision and carried down to the layer of the  device pocket.  The pocket was quite deep.  Further the device had  migrated at least 2-3 cm caudal to the incision line and it had to be  dissected out.  As we dissected towards the pacemaker, we became aware  that the leads were anterior to the device itself, and after we removed  the pulse generator, followed the course of these leads which was  through the anterior aspect of the pocket traversed posterior to the  pocket behind the can.  With some effort, the leads were freed up.  The  previously  implanted ventricular lead marked with a yellow band, serial  #[id number] demonstrated an R-wave of 17.7, impedance of 442, threshold 1.3  V at 0.5 msec, current threshold 3.0 mA.   The previously implanted RA lead serial #[id number] had a P-wave of 9 with  impedance of 423, a threshold 0.7 V at 0.5 msec, current threshold 1.6  mA.  With these acceptable parameter recorded, we then had to dissect  the pocket superiorly.  Again, we found the leads in the course of the  dissection.  The pocket was then copiously irrigated with antibiotic-  containing saline solution, and the leads were attached to a St. Jude  Zephyr pulse generator model [id number], serial [id number].  Ventricular pacing  and AV pacing were identified.  To offset the caudal migration, a 2-0  silk suture was placed at the cephalad aspect of the new pocket and  secured the device and pulled it cephalad.  Surgicel was used at the  superior and medial aspects of the pocket just because of the modestly  extensive dissection, and hemostasis having been assured prior to its  use and the pocket having been irrigated with antibiotic-containing  saline  solution.  The wound  was then closed in 3 layers in normal fashion.  The  wound was washed, dried, and benzoin and Steri-Strip dressing was  applied.  Needle counts, sponge counts, and instrument counts were  correct at the end of the procedure.  The patient tolerated the  procedure without apparent complication.      Duke Salvia, MD, [facility]  Electronically Signed     SCK/MEDQ  D:  04/03/2008  T:  04/04/2008  Job:  [id number]   cc:   [facility]

## 2011-04-13 NOTE — Assessment & Plan Note (Signed)
[facility]                         ELECTROPHYSIOLOGY OFFICE NOTE   [name], [name]                      MRN:          [id number]  DATE:08/22/2008                            DOB:          07-08-1941    [name] was seen in followup for a pacemaker generator replacement  that was initially undertaken at [facility] that we  replaced in May.  His complaints were of exercise intolerance.  He  sleeps poorly.  He is not very active, but not withstanding.  His heart  rate excursion demonstrated that 96% plus of his beats were in the 70-80  heart rate, which is his lower rate limit.   His medications include dilt 180, atenolol 25 b.i.d., and aspirin.   On examination, his blood pressure was 105/74, his pulse was 69, his  weight was 206.  His lungs were clear.  His heart sounds were regular.  The extremities were without edema.   Interrogation of his St. Jude Zephyr pulse generator demonstrated stable  lead parameters.  The heart rate excursion was as noted.  I decreased  the threshold from auto +5 to auto -5.  I increased the AV delay from  200-250 to allow for intrinsic conduction because that was lost at  minimally increased heart rates, and I added a sleep mode to a resting  rate of 60.   We will plan to see him again in 6 months' time.  He is to call and let  us know how he is doing in about 2 or 3 weeks' time.     Duke Salvia, MD, [facility]  Electronically Signed    SCK/MedQ  DD: 08/22/2008  DT: 08/23/2008  Job #: [id number]

## 2011-06-28 ENCOUNTER — Other Ambulatory Visit: Payer: Self-pay | Admitting: Internal Medicine

## 2011-06-30 ENCOUNTER — Telehealth: Payer: Self-pay | Admitting: *Deleted

## 2011-06-30 MED ORDER — METOPROLOL TARTRATE 25 MG PO TABS: 25.0000 mg | ORAL_TABLET | Freq: Two times a day (BID) | ORAL | Status: DC

## 2011-06-30 NOTE — Telephone Encounter (Signed)
Pt needs [facility]rol[facility] 25mg  bid

## 2011-08-26 ENCOUNTER — Other Ambulatory Visit: Payer: Self-pay | Admitting: Cardiology

## 2011-09-27 ENCOUNTER — Other Ambulatory Visit: Payer: Self-pay | Admitting: Cardiology

## 2011-09-29 ENCOUNTER — Other Ambulatory Visit: Payer: Self-pay | Admitting: *Deleted

## 2011-09-29 MED ORDER — METOPROLOL TARTRATE 25 MG PO TABS: 25.0000 mg | ORAL_TABLET | Freq: Two times a day (BID) | ORAL | Status: DC

## 2011-10-26 ENCOUNTER — Other Ambulatory Visit: Payer: Self-pay | Admitting: Cardiology

## 2011-11-05 ENCOUNTER — Other Ambulatory Visit: Payer: Self-pay | Admitting: Internal Medicine

## 2011-11-05 NOTE — Telephone Encounter (Signed)
Pt needs ov with Dr. Johney Frame

## 2011-11-10 ENCOUNTER — Encounter: Payer: Self-pay | Admitting: *Deleted

## 2011-11-12 ENCOUNTER — Encounter: Payer: Self-pay | Admitting: Cardiology

## 2011-11-12 ENCOUNTER — Ambulatory Visit (INDEPENDENT_AMBULATORY_CARE_PROVIDER_SITE_OTHER): Payer: Medicare PPO | Admitting: Cardiology

## 2011-11-12 ENCOUNTER — Ambulatory Visit (INDEPENDENT_AMBULATORY_CARE_PROVIDER_SITE_OTHER): Payer: Medicare PPO | Admitting: Internal Medicine

## 2011-11-12 ENCOUNTER — Encounter: Payer: Self-pay | Admitting: Internal Medicine

## 2011-11-12 ENCOUNTER — Encounter: Payer: Medicare PPO | Admitting: Internal Medicine

## 2011-11-12 VITALS — BP 119/82 | HR 86 | Ht 71.0 in | Wt 216.0 lb

## 2011-11-12 DIAGNOSIS — R0789 Other chest pain: Secondary | ICD-10-CM

## 2011-11-12 DIAGNOSIS — Z9229 Personal history of other drug therapy: Secondary | ICD-10-CM

## 2011-11-12 DIAGNOSIS — R002 Palpitations: Secondary | ICD-10-CM

## 2011-11-12 DIAGNOSIS — I495 Sick sinus syndrome: Secondary | ICD-10-CM

## 2011-11-12 DIAGNOSIS — Z09 Encounter for follow-up examination after completed treatment for conditions other than malignant neoplasm: Secondary | ICD-10-CM

## 2011-11-12 DIAGNOSIS — I4891 Unspecified atrial fibrillation: Secondary | ICD-10-CM

## 2011-11-12 LAB — PACEMAKER DEVICE OBSERVATION
AL AMPLITUDE: 3.7 mv
AL IMPEDENCE PM: 402 Ohm
BAMS-0003: 75 {beats}/min
BATTERY VOLTAGE: 2.78 V
RV LEAD AMPLITUDE: 10.6 mv

## 2011-11-12 MED ORDER — AMIODARONE HCL 200 MG PO TABS: 200.0000 mg | ORAL_TABLET | Freq: Every day | ORAL | Status: DC

## 2011-11-12 MED ORDER — METOPROLOL TARTRATE 25 MG PO TABS: 25.0000 mg | ORAL_TABLET | Freq: Two times a day (BID) | ORAL | Status: DC

## 2011-11-12 NOTE — Progress Notes (Signed)
PCP:  TAPPER,Cameron B, MD  The patient presents today for routine electrophysiology followup.  Since last being seen in our clinic, the patient reports doing reasonably well.  He continues to have occasional episodes of afib.  She describes tachy palpitations with decreased exercise tolerance during afib.  This occurs 1-2 times per month.  Today, he denies symptoms of chest pain, shortness of breath, orthopnea, PND, lower extremity edema, dizziness, presyncope, syncope, or neurologic sequela.  The patient feels that he is tolerating medications without difficulties and is otherwise without complaint today.   Past Medical History  Diagnosis Date  . Asthma   . Tachy-brady syndrome     s/p PPM (SJM) implanted by Dr Graciela Husbands 10/19/99, most recent gen change 04/03/08  . Paroxysmal atrial fibrillation   . Bronchitis     h/o   . Peptic ulcer disease   . History of recurrent UTIs   . Ruptured appendicitis     h/o  . Acute epididymitis     h/o   Past Surgical History  Procedure Date  . Pacemaker placement 03/2008    [employer] Jude, [place]. 1610    Current Outpatient Prescriptions  Medication Sig Dispense Refill  . amiodarone (PACERONE) 200 MG tablet Take 1 tablet (200 mg total) by mouth daily.  30 tablet  11  . aspirin 81 MG tablet Take 81 mg by mouth 2 (two) times daily.        . metoprolol tartrate (LOPRESSOR) 25 MG tablet Take 1 tablet (25 mg total) by mouth 2 (two) times daily.  60 tablet  11  . Multiple Vitamin (MULTIVITAMIN) tablet Take 1 tablet by mouth daily.        Marland Kitchen DISCONTD: amiodarone (PACERONE) 200 MG tablet TAKE 1 TABLET ONCE DAILY.  30 tablet  2    Allergies  Allergen Reactions  . Bee     BEE STINGS  . Codeine     REACTION: muscle cramps    History   Social History  . Marital Status: Divorced    Spouse Name: [name]    Number of Children: [name]  . Years of Education: [name]   Occupational History  . Not on file.   Social History Main Topics  . Smoking status: Former Smoker --  1.5 packs/day for 20 years    Types: Cigarettes    Quit date: 11/30/1977  . Smokeless tobacco: Never Used  . Alcohol Use: No  . Drug Use: No  . Sexually Active: Not on file   Other Topics Concern  . Not on file   Social History Narrative  . No narrative on file    No family history on file.  ROS-  All systems are reviewed and are negative except as outlined in the HPI above   Physical Exam: Filed Vitals:   11/12/11 1602  BP: 123/85  Pulse: 75  Height: 5\' 11"  (1.803 m)  Weight: 216 lb (97.977 kg)    GEN- The patient is well appearing, alert and oriented x 3 today.   Head- normocephalic, atraumatic Eyes-  Sclera clear, conjunctiva pink Ears- hearing intact Oropharynx- clear Neck- supple, no JVP Lymph- no cervical lymphadenopathy Lungs- Clear to ausculation bilaterally, normal work of breathing Chest- pacemaker pocket is well healed Heart- Regular rate and rhythm, no murmurs, rubs or gallops, PMI not laterally displaced GI- soft, NT, ND, + BS Extremities- no clubbing, cyanosis, or edema MS- no significant deformity or atrophy Skin- no rash or lesion Psych- euthymic mood, full affect Neuro- strength and  sensation are intact  Pacemaker interrogation- reviewed in detail today,  See PACEART report  Assessment and Plan:

## 2011-11-12 NOTE — Assessment & Plan Note (Addendum)
He continues to have paroxysms of afib despite medical therapy with amiodarone. Therapeutic strategies for afib including medicine and ablation were discussed in detail with the patient today. Risk, benefits, and alternatives to EP study and radiofrequency ablation for afib were also discussed in detail today.  At this time he is clear in his decision to decline ablation. I think that he would be a good candidate for ablation and could hopefully come off of amiodarone long term. His CHADS2 score is 0.  We will continue ASA for now.  I have instructed him to have LFTs/TFTs checked twice per year by Dr Margo Common.

## 2011-11-12 NOTE — Patient Instructions (Signed)
Your physician recommends that you go to the [facility] for lab work for LFT's & TSH.   If the results of your test are normal or stable, you will receive a letter.  If they are abnormal, the nurse will contact you by phone. Your physician wants you to follow up in:  1 year.  You will receive a reminder letter in the mail one-two months in advance.  If you don't receive a letter, please call our office to schedule the follow up appointment

## 2011-11-12 NOTE — Assessment & Plan Note (Signed)
Normal pacemaker function See Pace Art report No changes today  

## 2011-11-13 DIAGNOSIS — R0789 Other chest pain: Secondary | ICD-10-CM | POA: Insufficient documentation

## 2011-11-13 NOTE — Assessment & Plan Note (Signed)
Patient reports no palpitations.

## 2011-11-13 NOTE — Assessment & Plan Note (Signed)
Status post pacemaker implantation. The patient will have a pacemaker interrogation later today by Dr. Johney Frame. No apparent malfunction.

## 2011-11-13 NOTE — Progress Notes (Signed)
Cameron Bottoms, MD, [facility] ABIM Board Certified in Adult Cardiovascular Medicine,Internal Medicine and Critical Care Medicine    CC: Followup patient with atrial fibrillation not seen since 2009 for general cardiology  HPI:  The patient is a 70 year old male with no prior history of coronary disease, status post pacemaker implantation for tachybradycardia syndrome with paroxysmal atrial fibrillation and sick sinus syndrome. He is currently in normal sinus rhythm with a chronic left bundle branch block. He reports no history of substernal chest pain. No angina. He does have occasional sharp shooting pain lasting for seconds on the left side of his chest. There is no exertional component. Sometimes he has feelings of being off balance. He does not smoke anymore. In 2010 he was started on amiodarone after 2 cardioversions to maintain normal sinus rhythm at [facility]. Since that time the patient does report some photosensitivity, he has significant facial redness in the office today. In the past he has been on Coumadin but given his low risk factor profile is currently only on aspirin. He has an appointment scheduled later today with Dr. Johney Frame in the pacemaker clinic for further evaluation. I reviewed with the patient his laboratory work. His LDL cholesterol is 145 and HDL is 41. Again of note is that the patient does not have a history of coronary artery disease. Is on a minimal amount of medications. However it has been no recent evaluation of his TSH. Liver function tests have been within normal limits. Otherwise from a cardiovascular perspective his been quite stable with no orthopnea PND palpitations or syncope     PMH: reviewed and listed in Problem List in Electronic Records (and see below) Past Medical History  Diagnosis Date  . Asthma   . Tachy-brady syndrome     s/p PPM (SJM) implanted by Dr Graciela Husbands 10/19/99, most recent gen change 04/03/08  . Paroxysmal atrial fibrillation   .  Bronchitis     h/o   . Peptic ulcer disease   . History of recurrent UTIs   . Ruptured appendicitis     h/o  . Acute epididymitis     h/o   Past Surgical History  Procedure Date  . Pacemaker placement 03/2008    [employer] Jude, [place]. 7846     Allergies/SH/FHX : available in Electronic Records for review and reviewed.  Medications: Current Outpatient Prescriptions  Medication Sig Dispense Refill  . amiodarone (PACERONE) 200 MG tablet Take 1 tablet (200 mg total) by mouth daily.  30 tablet  11  . aspirin 81 MG tablet Take 81 mg by mouth 2 (two) times daily.        . metoprolol tartrate (LOPRESSOR) 25 MG tablet Take 1 tablet (25 mg total) by mouth 2 (two) times daily.  60 tablet  11  . Multiple Vitamin (MULTIVITAMIN) tablet Take 1 tablet by mouth daily.          ROS: No nausea or vomiting. No fever or chills.No melena or hematochezia.No bleeding.No claudication.  Physical Exam: BP 119/82  Pulse 86  Ht 5\' 11"  (1.803 m)  Wt 216 lb (97.977 kg)  BMI 30.13 kg/m2 General: Well-nourished white male in no apparent distress. Facial redness is noted. Neck: Normal carotid upstroke and no carotid bruit. No thyromegaly nonnodular thyroid. JVD is 5 cm Lungs: Clear breath sounds bilaterally without any wheezing.  Cardiac:Regular rate and rhythm with normal S1 and paradoxically split second heart sound without pathological murmurs Vascular: No edema. Normal dorsalis pedis and posterior tibial pulses  Skin: Warm and dry but as noted above marked facial redness.  12lead ECG: Normal sinus rhythm with left bundle branch block Limited bedside ECHO:[name]   Assessment and Plan

## 2011-11-13 NOTE — Assessment & Plan Note (Signed)
Patient is maintaining normal sinus rhythm on amiodarone. However he has significant photosensitivity likely related to drug therapy. I have warned him about excessive exposure to the sun and apply sunscreen, status on as much as possible as well as wearing a large hat. The patient is also balding and I feel he is at significant increased risk for skin cancers and asked him monitor this closely with his physician and possibly with routine followup to dermatology. We will also obtain a TSH and I told the patient this will need to be monitored twice a year.

## 2011-11-13 NOTE — Assessment & Plan Note (Signed)
No further ischemia workup is needed. I did counsel the patient about primary prevention at this point I do not think he needs to be on a statin necessarily but he does need to increase his activity, monitor his diet and follow therapeutic lifestyle changes.

## 2011-11-19 ENCOUNTER — Encounter: Payer: Self-pay | Admitting: Internal Medicine

## 2012-04-11 ENCOUNTER — Telehealth: Payer: Self-pay | Admitting: *Deleted

## 2012-04-11 NOTE — Telephone Encounter (Signed)
Message left on voicemail - feels like back in atrial fib.  Returned call - states he feels like he is back in atrial fib.  Fatigue, no energy, headaches - just like he has felt in the past.  Has OV on 5/24, but does not feel that he can wait that long.    Nurse visit scheduled for tomorrow morning at 8:30 for EKG & vitals.  Advised to bring all medications.  He verbalized understanding.

## 2012-04-12 ENCOUNTER — Ambulatory Visit (INDEPENDENT_AMBULATORY_CARE_PROVIDER_SITE_OTHER): Payer: Medicare PPO | Admitting: *Deleted

## 2012-04-12 ENCOUNTER — Encounter: Payer: Self-pay | Admitting: *Deleted

## 2012-04-12 VITALS — BP 105/72 | HR 76 | Ht 71.0 in | Wt 215.0 lb

## 2012-04-12 DIAGNOSIS — I4891 Unspecified atrial fibrillation: Secondary | ICD-10-CM

## 2012-04-12 NOTE — Progress Notes (Signed)
Make sure patient increase his fluid intake and drinks like G2. Decrease amiodarone from 200 mg daily to 100 mg daily. Decrease metoprolol to 12.5 mg by mouth twice a day Have patient come back on Monday to recheck vitals and reassess his clinical response to change in medications. EKG was reviewed there was normal atrial pacing with intrinsic ventricular conduction with left bundle branch block. EKG with magnet demonstrated normal AV sequential pacing at a magnet rate of 100 beats per minute period no abnormalities were seen on the electrocardiogram. I reviewed the orthostatic blood pressures and there was a significant drop in blood pressure between standing and lying down. Alvin Critchley Gent 4:07 PM 04/12/2012

## 2012-04-12 NOTE — Progress Notes (Signed)
Notified patient of below.  Was driving currently, so he will call in the morning to discuss below.

## 2012-04-12 NOTE — Progress Notes (Signed)
Patient presents to office for ekg and vitals per telephone message from yesterday. Patient has taken all medications as scheduled without side effects. Patient c/o dizziness on last Thursday and Saturday. Patient also c/o fatigue and some sob. Patient denies having chest pain.

## 2012-04-13 MED ORDER — AMIODARONE HCL 200 MG PO TABS: 100.0000 mg | ORAL_TABLET | Freq: Every day | ORAL | Status: DC

## 2012-04-13 MED ORDER — METOPROLOL TARTRATE 25 MG PO TABS: 12.5000 mg | ORAL_TABLET | Freq: Two times a day (BID) | ORAL | Status: DC

## 2012-04-13 NOTE — Progress Notes (Signed)
Patient notified and verbalized understanding.   He will have friend of family check blood pressure on Monday & call office with update.  Can not afford nurse visit co-pay on Monday & then OV co-pay on Friday again.  Already has OV scheduled for 5/24 with GD.  Also, cautioned patient in regards to driving if he is still experiencing dizziness.

## 2012-04-21 ENCOUNTER — Ambulatory Visit (INDEPENDENT_AMBULATORY_CARE_PROVIDER_SITE_OTHER): Payer: Medicare PPO | Admitting: Cardiology

## 2012-04-21 ENCOUNTER — Encounter: Payer: Self-pay | Admitting: Cardiology

## 2012-04-21 ENCOUNTER — Other Ambulatory Visit: Payer: Self-pay | Admitting: Family Medicine

## 2012-04-21 VITALS — BP 109/74 | HR 75 | Ht 71.0 in | Wt 218.8 lb

## 2012-04-21 DIAGNOSIS — G903 Multi-system degeneration of the autonomic nervous system: Secondary | ICD-10-CM

## 2012-04-21 DIAGNOSIS — G238 Other specified degenerative diseases of basal ganglia: Secondary | ICD-10-CM

## 2012-04-21 DIAGNOSIS — I447 Left bundle-branch block, unspecified: Secondary | ICD-10-CM

## 2012-04-21 DIAGNOSIS — L568 Other specified acute skin changes due to ultraviolet radiation: Secondary | ICD-10-CM

## 2012-04-21 DIAGNOSIS — Z950 Presence of cardiac pacemaker: Secondary | ICD-10-CM

## 2012-04-21 DIAGNOSIS — T50904A Poisoning by unspecified drugs, medicaments and biological substances, undetermined, initial encounter: Secondary | ICD-10-CM

## 2012-04-21 DIAGNOSIS — I951 Orthostatic hypotension: Secondary | ICD-10-CM

## 2012-04-21 DIAGNOSIS — I480 Paroxysmal atrial fibrillation: Secondary | ICD-10-CM

## 2012-04-21 DIAGNOSIS — R0602 Shortness of breath: Secondary | ICD-10-CM

## 2012-04-21 DIAGNOSIS — I4891 Unspecified atrial fibrillation: Secondary | ICD-10-CM

## 2012-04-21 DIAGNOSIS — T50905A Adverse effect of unspecified drugs, medicaments and biological substances, initial encounter: Secondary | ICD-10-CM

## 2012-04-21 DIAGNOSIS — R0789 Other chest pain: Secondary | ICD-10-CM

## 2012-04-21 MED ORDER — MIDODRINE HCL 5 MG PO TABS: 5.0000 mg | ORAL_TABLET | Freq: Two times a day (BID) | ORAL | Status: DC

## 2012-04-21 NOTE — Patient Instructions (Addendum)
Your physician recommends that you schedule a follow-up appointment in: 6 months. You will receive a reminder letter in the mail in about 4 months reminding you to call and schedule your appointment. If you don't receive this letter, please contact our office.   Your physician has recommended you make the following change in your medication:  STOP AMIODARONE START MIDODRINE 5 MG TWICE DAILY/ WAIT AT LEAST A WEEK OR SO BEFORE STARTING THIS MEDICATION  Your physician recommends that you return for lab work in: TODAY AT [facility]

## 2012-05-02 ENCOUNTER — Encounter: Payer: Self-pay | Admitting: *Deleted

## 2012-05-11 ENCOUNTER — Telehealth: Payer: Self-pay | Admitting: Cardiology

## 2012-05-11 NOTE — Telephone Encounter (Signed)
Patient walked into clinic asking to be seen.  His complaints are weakness;dizziness and headaches that he believes the medicine Midorine is causing.   He asked that you call him today.

## 2012-05-12 NOTE — Telephone Encounter (Signed)
Spoke with patient and said he got dizzy and lightheaded on yesterday. Patient states that today he feels better and symptoms are resolved. Patient would like an appointment to come in to see a provider in the next couple of weeks.

## 2012-05-20 DIAGNOSIS — L568 Other specified acute skin changes due to ultraviolet radiation: Secondary | ICD-10-CM | POA: Insufficient documentation

## 2012-05-20 DIAGNOSIS — Z950 Presence of cardiac pacemaker: Secondary | ICD-10-CM | POA: Insufficient documentation

## 2012-05-20 DIAGNOSIS — G903 Multi-system degeneration of the autonomic nervous system: Secondary | ICD-10-CM | POA: Insufficient documentation

## 2012-05-20 DIAGNOSIS — I951 Orthostatic hypotension: Secondary | ICD-10-CM | POA: Insufficient documentation

## 2012-05-20 DIAGNOSIS — I447 Left bundle-branch block, unspecified: Secondary | ICD-10-CM | POA: Insufficient documentation

## 2012-05-20 DIAGNOSIS — T50905A Adverse effect of unspecified drugs, medicaments and biological substances, initial encounter: Secondary | ICD-10-CM | POA: Insufficient documentation

## 2012-05-20 NOTE — Assessment & Plan Note (Signed)
We'll discontinue amiodarone.  As outlined in my previous note the patient has significant photosensitivity and concern about future skin cancers.  RCFA should be considered in the future if recurrent atrial fibrillation.  The patient has an AV sequential pacemaker.  It also be considered for AV nodal ablation.

## 2012-05-20 NOTE — Assessment & Plan Note (Signed)
The patient has significant drop in blood pressure.  We will add midodrine to hismedical regimen at 5 mg by mouth twice a day.  Patient is to monitor his orthostatic blood pressures.  We will also check a TSH and BNP level.

## 2012-05-20 NOTE — Assessment & Plan Note (Signed)
Patient remained in AV sequential paced rhythm.  Details as outlined above.  Amiodarone will be discontinued.  Monitor for future episodes of atrial fibrillation.

## 2012-05-20 NOTE — Assessment & Plan Note (Signed)
No recurrent chest pain.  No prior history of coronary artery disease.

## 2012-05-20 NOTE — Assessment & Plan Note (Signed)
Chronic, stable 

## 2012-05-20 NOTE — Progress Notes (Signed)
Cameron Bottoms, MD, [facility] ABIM Board Certified in Adult Cardiovascular Medicine,Internal Medicine and Critical Care Medicine    CC: orthostatic hypotension  HPI:  The patient reports several episodes of decreased blood pressure.  Several days ago while at [facility] while standing a significant drop in blood pressure associated with dizziness and near syncope.  The episodes appear to occur on prolonged standing.  Patient had a recent pacemaker interrogation.  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  He denies any chest pain or shortness of breath orthopnea or PND.  He has no palpitations or true syncope.  No evidence of recurrent atrial fibrillation. Patient continues to have significant photosensitivity secondary to amiodarone.   PMH: reviewed and listed in Problem List in Electronic Records (and see below) Past Medical History  Diagnosis Date  . Asthma   . Tachy-brady syndrome     s/p PPM (SJM) implanted by Dr Graciela Husbands 10/19/99, most recent gen change 04/03/08  . Paroxysmal atrial fibrillation   . Bronchitis     h/o   . Peptic ulcer disease   . History of recurrent UTIs   . Ruptured appendicitis     h/o  . Acute epididymitis     h/o   Past Surgical History  Procedure Date  . Pacemaker placement 03/2008    [employer]. Jude, [place]. 1610    Allergies/SH/FHX : available in Electronic Records for review  Allergies  Allergen Reactions  . Codeine     REACTION: muscle cramps  . Nutritional Supplements     BEE STINGS   History   Social History  . Marital Status: Divorced    Spouse Name: [name]    Number of Children: [name]  . Years of Education: [name]   Occupational History  . Not on file.   Social History Main Topics  . Smoking status: Former Smoker -- 1.5 packs/day for 20 years    Types: Cigarettes    Quit date: 11/30/1977  . Smokeless tobacco: Never Used  . Alcohol Use: No  . Drug Use: No  . Sexually Active: Not on file   Other Topics Concern  .  Not on file   Social History Narrative  . No narrative on file   No family history on file.  Medications: Current Outpatient Prescriptions  Medication Sig Dispense Refill  . aspirin 81 MG tablet Take 81 mg by mouth 2 (two) times daily.        . metoprolol tartrate (LOPRESSOR) 25 MG tablet Take 0.5 tablets (12.5 mg total) by mouth 2 (two) times daily.      . midodrine (PROAMATINE) 5 MG tablet Take 1 tablet (5 mg total) by mouth 2 (two) times daily.  60 tablet  6  . Multiple Vitamin (MULTIVITAMIN) tablet Take 1 tablet by mouth daily.          ROS: No nausea or vomiting. No fever or chills.No melena or hematochezia.No bleeding.No claudication  Physical Exam: BP 109/74  Pulse 75  Ht 5\' 11"  (1.803 m)  Wt 218 lb 12.8 oz (99.247 kg)  BMI 30.52 kg/m2 General:well-nourished white male in no distress.  Facial and scalp redness is noted. Neck:normal carotid upstrokes and no carotid bruits.  No thyromegaly no nodular thyroid.  JVP is 5-6 cm Lungs:clear breath sounds bilaterally without any wheezing Cardiac:regular rate and rhythm with normal S1 and S2.second heart sound is paradoxically split.  No pathological murmurs Vascular:no edema.  Normal distal pulses Skin:warm and dry Physcologic:normal affect  12lead RUE:AVWUJW pacing  with left bundle branch block Limited bedside ECHO:[name] No images are attached to the encounter.   Assessment and Plan  Dysautonomia orthostatic hypotension syndrome The patient has significant drop in blood pressure.  We will add midodrine to hismedical regimen at 5 mg by mouth twice a day.  Patient is to monitor his orthostatic blood pressures.  We will also check a TSH and BNP level.  Drug-induced photosensitivity We'll discontinue amiodarone.  As outlined in my previous note the patient has significant photosensitivity and concern about future skin cancers.  RCFA should be considered in the future if recurrent atrial fibrillation.  The patient has an AV  sequential pacemaker.  It also be considered for AV nodal ablation.  Chest pain, non-cardiac No recurrent chest pain.  No prior history of coronary artery disease.  Left bundle branch block Chronic, stable  Paroxysmal atrial fibrillation Patient remained in AV sequential paced rhythm.  Details as outlined above.  Amiodarone will be discontinued.  Monitor for future episodes of atrial fibrillation.   Patient Active Problem List  Diagnosis  . Paroxysmal atrial fibrillation  . SINOATRIAL NODE DYSFUNCTION  . HYPERSOMNIA WITH SLEEP APNEA UNSPECIFIED  . PALPITATIONS, RECURRENT  . BEE STING ALLERGY  . Chest pain, non-cardiac  . Pacemaker  . Dysautonomia orthostatic hypotension syndrome  . Drug-induced photosensitivity  . Left bundle branch block

## 2012-06-16 ENCOUNTER — Telehealth: Payer: Self-pay | Admitting: Internal Medicine

## 2012-06-16 NOTE — Telephone Encounter (Signed)
06-16-12 lmm @ 1051am for pt to call [facility] to set up pacer ck/mt

## 2012-06-20 ENCOUNTER — Telehealth: Payer: Self-pay | Admitting: *Deleted

## 2012-06-20 NOTE — Telephone Encounter (Signed)
Message left on voice mail - problem with blood pressure, not getting any better.  Returned call - left message.

## 2012-06-21 NOTE — Telephone Encounter (Signed)
Patient notified of below.  Scheduled nurse visit for tomorrow morning at 8:45.

## 2012-06-21 NOTE — Telephone Encounter (Signed)
Patient called with concerns over his blood pressure.  Lightheadness & dizziness occasionally.  States has just not felt well since being started on Midodrine.  Stays tired all the time.   Advised GD out of office currently, will send to PA for review.

## 2012-06-21 NOTE — Telephone Encounter (Signed)
Pt started on Midodrine, by GD, back in May, for Rx of hypotension. Recommend RN visit here for vitals, including orthostatic BPs. Further recs to follow.

## 2012-06-21 NOTE — Telephone Encounter (Signed)
Left message to return call 

## 2012-06-22 ENCOUNTER — Ambulatory Visit (INDEPENDENT_AMBULATORY_CARE_PROVIDER_SITE_OTHER): Payer: Medicare PPO | Admitting: *Deleted

## 2012-06-22 VITALS — BP 110/79 | HR 74

## 2012-06-22 DIAGNOSIS — I959 Hypotension, unspecified: Secondary | ICD-10-CM

## 2012-06-22 NOTE — Progress Notes (Signed)
Orthostatic BPs reviewed, and are abnormal with sig drop in SBP for supine to standing. Therefore, recommend continuing Midodrine, and repeating ortho BPs at next OV.

## 2012-06-22 NOTE — Progress Notes (Signed)
Patient here for nurse visit today - orthostatics done.  Still c/o not feeling right since starting Midodrine.  Patient did have significant drop in BP from lying to sitting with visible lightheadedness.

## 2012-06-23 MED ORDER — MIDODRINE HCL 10 MG PO TABS: 10.0000 mg | ORAL_TABLET | Freq: Two times a day (BID) | ORAL | Status: DC

## 2012-06-23 NOTE — Progress Notes (Signed)
Patient notified of below & rx sent to [facility].  OV scheduled for 8/29 with Dr. Andee Lineman.

## 2012-06-23 NOTE — Progress Notes (Signed)
Noted below.  After further review yesterday, patient is not due for recall till December.    Discussed with Dr. Kirke Corin - MD recommend increasing Midodrine to 10mg  twice a day & follow up in 1 month.  Attempted to notify patient - left message.

## 2012-07-18 ENCOUNTER — Telehealth: Payer: Self-pay | Admitting: *Deleted

## 2012-07-18 NOTE — Telephone Encounter (Signed)
Patient requesting to stop Midodrine & go back on the Amiodarone.  States been having headaches like a nagging toothache every since being on this medication.  Doesn't think it is helping much anyway.  Patient already has OV scheduled for 8/29.

## 2012-07-22 NOTE — Telephone Encounter (Signed)
Patient can stop Midodrine but will need to discuss further therapy in office during appt.

## 2012-07-25 NOTE — Telephone Encounter (Signed)
Patient notified of below.   

## 2012-07-27 ENCOUNTER — Other Ambulatory Visit: Payer: Self-pay

## 2012-07-27 ENCOUNTER — Encounter: Payer: Self-pay | Admitting: Cardiology

## 2012-07-27 ENCOUNTER — Other Ambulatory Visit (INDEPENDENT_AMBULATORY_CARE_PROVIDER_SITE_OTHER): Payer: Medicare PPO

## 2012-07-27 ENCOUNTER — Ambulatory Visit (INDEPENDENT_AMBULATORY_CARE_PROVIDER_SITE_OTHER): Payer: Medicare PPO | Admitting: Cardiology

## 2012-07-27 VITALS — BP 97/61 | HR 69 | Ht 71.0 in | Wt 211.0 lb

## 2012-07-27 DIAGNOSIS — R0789 Other chest pain: Secondary | ICD-10-CM

## 2012-07-27 DIAGNOSIS — I480 Paroxysmal atrial fibrillation: Secondary | ICD-10-CM

## 2012-07-27 DIAGNOSIS — R002 Palpitations: Secondary | ICD-10-CM

## 2012-07-27 DIAGNOSIS — I4891 Unspecified atrial fibrillation: Secondary | ICD-10-CM

## 2012-07-27 DIAGNOSIS — I447 Left bundle-branch block, unspecified: Secondary | ICD-10-CM

## 2012-07-27 DIAGNOSIS — G903 Multi-system degeneration of the autonomic nervous system: Secondary | ICD-10-CM

## 2012-07-27 DIAGNOSIS — R0602 Shortness of breath: Secondary | ICD-10-CM

## 2012-07-27 DIAGNOSIS — G238 Other specified degenerative diseases of basal ganglia: Secondary | ICD-10-CM

## 2012-07-27 DIAGNOSIS — I951 Orthostatic hypotension: Secondary | ICD-10-CM

## 2012-07-27 DIAGNOSIS — Z950 Presence of cardiac pacemaker: Secondary | ICD-10-CM

## 2012-07-27 NOTE — Patient Instructions (Addendum)
Continue all current medications. May take another 25mg  Metoprolol at noon Echo this afternoon Nurse visit in the morning for EKG & vitals Nothing to eat or drink after 12 midnight tonight (may need cardioversion)

## 2012-07-27 NOTE — Assessment & Plan Note (Signed)
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 He has a left bundle branch morphology with this. He has symptoms of shortness of breath and some mild dizziness associated with it. He also has mild chest pain which he describes as sharp but does not appear to worsen on exertion. Left bundle branch block is old. Patient was currently not on Coumadin anymore because his chads score is quite low and is 0-1. Is not clear when his rhythm abnormality started and therefore I cannot do an urgent cardioversion. I suspect this is been going on several weeks because he didn't been feeling poorly with increased shortness of breath particularly when he does any exertion and when he goes to [employer]. I gave the patient in the office a dose of 400 mg of amiodarone [he has normal LV function we presume] and a dose of metoprolol 25 mg. We monitored him for 20 minutes and his heart rate was down to 119 beats per minute with stable vital signs. The plan is for the patient to come back this afternoon and had an echocardiogram done to make sure he does not have any LV dysfunction. If so I will admit the patient and we will start him on anticoagulation and schedule him for inpatient cardioversion. His ejection fraction is otherwise normal we'll continue with by mouth amiodarone as an outpatient and start the patient on warfarin and bring him back for an elective cardioversion. Of note is that the patient has a permanent pacemaker placed and therefore we can be rather aggressive with controlling his heart rate as an outpatient

## 2012-07-27 NOTE — Assessment & Plan Note (Signed)
Followed by the EP service.

## 2012-07-27 NOTE — Assessment & Plan Note (Signed)
-   Chronic unchanged. 

## 2012-07-27 NOTE — Progress Notes (Signed)
Peyton Bottoms, MD, [facility] ABIM Board Certified in Adult Cardiovascular Medicine,Internal Medicine and Critical Care Medicine    CC:    Shortness of breath on exertion and rapid heart rate                                                                              HPI:        The patient presented to the shortness of breath on exertion particularly when he goes to [employer]. Also bending over and rapidly coming up causes him to be dizzy. He has been given by one of my partners Midrin in the past but this has not particularly helped and the patient felt very poorly on this. He does report some atypical chest pain over the last 2-3 days he describes as a sharp pain mainly at rest and on exertion. The patient does have a prior history of tachycardia palpitations and has been on amiodarone and has been cardioverted at least twice in the past. Amiodarone was discontinued because of photosensitivity. His chads 2 score is only 1 at most in the past we discontinued warfarin. The patient is not sure how long his been in this rhythm abnormality because he is relatively asymptomatic short of the feelings of shortness of breath and at times feeling hot upon exertion. Clinically it appears that it has at least been going on for several weeks.  PMH: reviewed and listed in Problem List in Electronic Records (and see below) Past Medical History  Diagnosis Date  . Asthma   . Tachy-brady syndrome     s/p PPM (SJM) implanted by Dr Graciela Husbands 10/19/99, most recent gen change 04/03/08  . Paroxysmal atrial fibrillation   . Bronchitis     h/o   . Peptic ulcer disease   . History of recurrent UTIs   . Ruptured appendicitis     h/o  . Acute epididymitis     h/o   Past Surgical History  Procedure Date  . Pacemaker placement 03/2008    [employer], [place]. 1610    Allergies/SH/FHX : available in Electronic Records for review  Allergies  Allergen Reactions  . Codeine     REACTION: muscle cramps  . Nutritional  Supplements     BEE STINGS   History   Social History  . Marital Status: Divorced    Spouse Name: [name]    Number of Children: [name]  . Years of Education: [name]   Occupational History  . Not on file.   Social History Main Topics  . Smoking status: Former Smoker -- 1.5 packs/day for 20 years    Types: Cigarettes    Quit date: 11/30/1977  . Smokeless tobacco: Never Used  . Alcohol Use: No  . Drug Use: No  . Sexually Active: Not on file   Other Topics Concern  . Not on file   Social History Narrative  . No narrative on file   No family history on file.  Medications: Current Outpatient Prescriptions  Medication Sig Dispense Refill  . aspirin 81 MG tablet Take 81 mg by mouth 2 (two) times daily.        Marland Kitchen ibuprofen (ADVIL,MOTRIN) 200 MG tablet Take 200 mg  by mouth as needed.      . metoprolol tartrate (LOPRESSOR) 25 MG tablet Take 0.5 tablets (12.5 mg total) by mouth 2 (two) times daily.      . Multiple Vitamin (MULTIVITAMIN) tablet Take 1 tablet by mouth daily.          ROS: No nausea or vomiting. No fever or chills.No melena or hematochezia.No bleeding.No claudication  Physical Exam: BP 97/61  Pulse 69  Ht 5\' 11"  (1.803 m)  Wt 211 lb (95.709 kg)  BMI 29.43 kg/m2  SpO2 96% General: Well-nourished white male in no distress. Neck: Normal carotid upstroke no carotid bruit. No thyromegaly nonnodular thyroid. JVP 6-7 cm Lungs: Clear breath sounds bilaterally without wheezing Cardiac: Irregular rate and rhythm with normal S1-S2 no pathological murmurs with a heart rate of 132 beats per minute  Vascular: No edema. Normal distal pulses Skin: Warm and dry Physcologic: Normal affect  12lead ECG: Atrial flutter/atrial fibrillation heart rate 132 beats per minute Limited bedside ECHO:[name] No images are attached to the encounter.   I reviewed and summarized the old records. I reviewed ECG and prior blood work.  Assessment and Plan  Paroxysmal atrial fibrillation Atrial  flutter which is atypical. He has a left bundle branch morphology with this. He has symptoms of shortness of breath and some mild dizziness associated with it. He also has mild chest pain which he describes as sharp but does not appear to worsen on exertion. Left bundle branch block is old. Patient was currently not on Coumadin anymore because his chads score is quite low and is 0-1. Is not clear when his rhythm abnormality started and therefore I cannot do an urgent cardioversion. I suspect this is been going on several weeks because he didn't been feeling poorly with increased shortness of breath particularly when he does any exertion and when he goes to [employer]. I gave the patient in the office a dose of 400 mg of amiodarone [he has normal LV function we presume] and a dose of metoprolol 25 mg. We monitored him for 20 minutes and his heart rate was down to 119 beats per minute with stable vital signs. The plan is for the patient to come back this afternoon and had an echocardiogram done to make sure he does not have any LV dysfunction. If so I will admit the patient and we will start him on anticoagulation and schedule him for inpatient cardioversion. His ejection fraction is otherwise normal we'll continue with by mouth amiodarone as an outpatient and start the patient on warfarin and bring him back for an elective cardioversion. Of note is that the patient has a permanent pacemaker placed and therefore we can be rather aggressive with controlling his heart rate as an outpatient  PALPITATIONS, RECURRENT Likely secondary to recurrent atrial arrhythmias as outlined  Left bundle branch block Chronic unchanged.  Dysautonomia orthostatic hypotension syndrome Patient tried Midrin but he felt very poor with this and this was discontinued. He maintains a blood pressure of around 100 mm systolic but does not report any presyncope or syncope.  Chest pain, non-cardiac Patient had several catheterizations in  the past with his last catheterization performed 5 years ago with no evidence of significant coronary artery disease. We'll decide after management of his atrial arrhythmia if he needs further ischemia testing. He does not seem to report any angina currently  Pacemaker Followed by the EP service.    Patient Active Problem List  Diagnosis  . Paroxysmal atrial fibrillation  .  SINOATRIAL NODE DYSFUNCTION  . HYPERSOMNIA WITH SLEEP APNEA UNSPECIFIED  . PALPITATIONS, RECURRENT  . BEE STING ALLERGY  . Chest pain, non-cardiac  . Pacemaker  . Dysautonomia orthostatic hypotension syndrome  . Drug-induced photosensitivity  . Left bundle branch block

## 2012-07-27 NOTE — Assessment & Plan Note (Signed)
Patient tried Midrin but he felt very poor with this and this was discontinued. He maintains a blood pressure of around 100 mm systolic but does not report any presyncope or syncope.

## 2012-07-27 NOTE — Assessment & Plan Note (Signed)
Likely secondary to recurrent atrial arrhythmias as outlined

## 2012-07-27 NOTE — Assessment & Plan Note (Signed)
Patient had several catheterizations in the past with his last catheterization performed 5 years ago with no evidence of significant coronary artery disease. We'll decide after management of his atrial arrhythmia if he needs further ischemia testing. He does not seem to report any angina currently

## 2012-07-28 ENCOUNTER — Ambulatory Visit (INDEPENDENT_AMBULATORY_CARE_PROVIDER_SITE_OTHER): Payer: Medicare PPO | Admitting: *Deleted

## 2012-07-28 ENCOUNTER — Other Ambulatory Visit: Payer: Self-pay | Admitting: *Deleted

## 2012-07-28 VITALS — BP 114/76 | HR 66

## 2012-07-28 DIAGNOSIS — I4891 Unspecified atrial fibrillation: Secondary | ICD-10-CM

## 2012-07-28 MED ORDER — WARFARIN SODIUM 2.5 MG PO TABS: 2.5000 mg | ORAL_TABLET | Freq: Every day | ORAL | Status: DC

## 2012-07-28 MED ORDER — METOPROLOL SUCCINATE ER 25 MG PO TB24: 25.0000 mg | ORAL_TABLET | Freq: Two times a day (BID) | ORAL | Status: DC

## 2012-07-28 NOTE — Patient Instructions (Signed)
   Amiodarone 200mg  twice a day   Coumadin 2.5mg  every evening  Metoprolol Succ. (Toprol XL) 25mg  twice a day    Establish in our coumadin clinic with Misty Stanley - first visit on Tuesday   Pending - TEE/DCCV in approximately 2 weeks

## 2012-08-01 ENCOUNTER — Encounter: Payer: Self-pay | Admitting: *Deleted

## 2012-08-01 ENCOUNTER — Ambulatory Visit (INDEPENDENT_AMBULATORY_CARE_PROVIDER_SITE_OTHER): Payer: Medicare PPO | Admitting: *Deleted

## 2012-08-01 DIAGNOSIS — I480 Paroxysmal atrial fibrillation: Secondary | ICD-10-CM

## 2012-08-01 DIAGNOSIS — I4891 Unspecified atrial fibrillation: Secondary | ICD-10-CM

## 2012-08-01 DIAGNOSIS — Z7901 Long term (current) use of anticoagulants: Secondary | ICD-10-CM

## 2012-08-04 ENCOUNTER — Ambulatory Visit (INDEPENDENT_AMBULATORY_CARE_PROVIDER_SITE_OTHER): Payer: Medicare PPO | Admitting: *Deleted

## 2012-08-04 ENCOUNTER — Telehealth: Payer: Self-pay | Admitting: *Deleted

## 2012-08-04 DIAGNOSIS — I4891 Unspecified atrial fibrillation: Secondary | ICD-10-CM

## 2012-08-04 DIAGNOSIS — Z7901 Long term (current) use of anticoagulants: Secondary | ICD-10-CM

## 2012-08-04 DIAGNOSIS — I480 Paroxysmal atrial fibrillation: Secondary | ICD-10-CM

## 2012-08-04 MED ORDER — AMIODARONE HCL 200 MG PO TABS: 200.0000 mg | ORAL_TABLET | Freq: Two times a day (BID) | ORAL | Status: DC

## 2012-08-04 NOTE — Telephone Encounter (Signed)
Spoke with pharmacy and clarified amidorone.

## 2012-08-07 ENCOUNTER — Telehealth: Payer: Self-pay | Admitting: *Deleted

## 2012-08-07 NOTE — Telephone Encounter (Signed)
error 

## 2012-08-08 ENCOUNTER — Ambulatory Visit (INDEPENDENT_AMBULATORY_CARE_PROVIDER_SITE_OTHER): Payer: Medicare PPO | Admitting: *Deleted

## 2012-08-08 DIAGNOSIS — I480 Paroxysmal atrial fibrillation: Secondary | ICD-10-CM

## 2012-08-08 DIAGNOSIS — I4891 Unspecified atrial fibrillation: Secondary | ICD-10-CM

## 2012-08-08 DIAGNOSIS — Z7901 Long term (current) use of anticoagulants: Secondary | ICD-10-CM

## 2012-08-10 ENCOUNTER — Telehealth: Payer: Self-pay

## 2012-08-10 ENCOUNTER — Encounter: Payer: Self-pay | Admitting: *Deleted

## 2012-08-10 ENCOUNTER — Ambulatory Visit (INDEPENDENT_AMBULATORY_CARE_PROVIDER_SITE_OTHER): Payer: Medicare PPO | Admitting: Physician Assistant

## 2012-08-10 ENCOUNTER — Encounter: Payer: Self-pay | Admitting: Physician Assistant

## 2012-08-10 VITALS — BP 127/80 | HR 95 | Ht 70.0 in | Wt 211.0 lb

## 2012-08-10 DIAGNOSIS — Z950 Presence of cardiac pacemaker: Secondary | ICD-10-CM

## 2012-08-10 DIAGNOSIS — I447 Left bundle-branch block, unspecified: Secondary | ICD-10-CM

## 2012-08-10 DIAGNOSIS — I429 Cardiomyopathy, unspecified: Secondary | ICD-10-CM

## 2012-08-10 DIAGNOSIS — I428 Other cardiomyopathies: Secondary | ICD-10-CM

## 2012-08-10 DIAGNOSIS — Z01810 Encounter for preprocedural cardiovascular examination: Secondary | ICD-10-CM

## 2012-08-10 DIAGNOSIS — I420 Dilated cardiomyopathy: Secondary | ICD-10-CM | POA: Insufficient documentation

## 2012-08-10 DIAGNOSIS — I4891 Unspecified atrial fibrillation: Secondary | ICD-10-CM

## 2012-08-10 DIAGNOSIS — I480 Paroxysmal atrial fibrillation: Secondary | ICD-10-CM

## 2012-08-10 NOTE — Assessment & Plan Note (Signed)
Following review with Dr. Antoine Poche, recommendation is as follows: Patient will be scheduled for an elective cardiac catheterization in the JV lab early next week, to rule out significant occult CAD. Given recent therapeutic INR, Coumadin is to be placed on hold, and we will repeat labs next Monday. If his INR is less than 1.5, then we will plan to proceed with catheterization the following day. We also will discontinue amiodarone, so as to avoid the possibility of spontaneous cardioversion to NSR, while off anticoagulation. Following the procedure, patient is to resume Coumadin and followup here in our [facility] Coumadin clinic later next week. We'll also arrange for him to return to Dr. Antoine Poche next week, as well, to discuss subsequent therapeutic options for his symptomatic atrial fibrillation. Of note, patient is to keep his scheduled device clinic followup here in our clinic tomorrow, with Dr. Johney Frame.

## 2012-08-10 NOTE — Assessment & Plan Note (Signed)
Patient to follow back here in our device clinic tomorrow, with Dr. Johney Frame, as previously scheduled.

## 2012-08-10 NOTE — Progress Notes (Addendum)
Primary Cardiologist: Guy DeGent, MD   HPI: Schedule followup for review of most recent echocardiogram result, per Dr. DeGent's recommendation.    - 2-D echo, 07/27/12, reviewed by Dr. DeGent: EF 30-35%, multiple WMAs, grade 2 diastolic dysfunction, mild AR  Patient continues to experience mild exertional dyspnea since most recent OV, August 29, with Dr. DeGent. He denies any interim exertional CP, and reportedly had 2 prior cardiac catheterizations, well over 10 years ago, in [place], [place] , which were negative for significant coronary disease. He generally feels "rotten", and has felt this way for the last few weeks. He denies any orthopnea, PND, LE edema, or tachycardia palpitations.  When last seen here in clinic, by Dr. DeGent, he was started on Coumadin and amiodarone, in anticipation of proceeding with DCCV. His INR was 3.1 yesterday. However, given the results of his subsequent echocardiogram, reviewed by Dr. DeGent, patient was scheduled today for review of these results, and for further recommendations.  Patient had prior echocardiograms in 2010, one here at [facility] in April indicating an EF of 45-50% with WMAs, and a repeat study at [facility] 2 months later suggesting an EF of 50-55%, with no WMAs.  EKG today indicates underlying atrial fibrillation at approximately 95 bpm with intermittent ventricular pacing  Allergies  Allergen Reactions  . Codeine     REACTION: muscle cramps  . Nutritional Supplements     BEE STINGS    Current Outpatient Prescriptions  Medication Sig Dispense Refill  . acetaminophen (TYLENOL) 325 MG tablet Take 650 mg by mouth as needed.      . aspirin 81 MG tablet Take 81 mg by mouth 2 (two) times daily.        . metoprolol succinate (TOPROL-XL) 25 MG 24 hr tablet Take 1 tablet (25 mg total) by mouth 2 (two) times daily.  60 tablet  6  . Multiple Vitamin (MULTIVITAMIN) tablet Take 1 tablet by mouth daily.          Past Medical  History  Diagnosis Date  . Asthma   . Tachy-brady syndrome     s/p PPM (SJM) implanted by Dr Klein 10/19/99, most recent gen change 04/03/08  . Paroxysmal atrial fibrillation   . Bronchitis     h/o   . Peptic ulcer disease   . History of recurrent UTIs   . Ruptured appendicitis     h/o  . Acute epididymitis     h/o    Past Surgical History  Procedure Date  . Pacemaker placement 03/2008    [employer], [place]. 5826    History   Social History  . Marital Status: Divorced    Spouse Name: [name]    Number of Children: [name]  . Years of Education: [name]   Occupational History  . Not on file.   Social History Main Topics  . Smoking status: Former Smoker -- 1.5 packs/day for 20 years    Types: Cigarettes    Quit date: 11/30/1977  . Smokeless tobacco: Never Used  . Alcohol Use: No  . Drug Use: No  . Sexually Active: Not on file   Other Topics Concern  . Not on file   Social History Narrative  . No narrative on file    No family history on file.  ROS: no nausea, vomiting; no fever, chills; no melena, hematochezia; no claudication  PHYSICAL EXAM: BP 127/80  Pulse 95  Ht 5' 10" (1.778 m)  Wt 211 lb (95.709 kg)  BMI 30.28   kg/m2  SpO2 96% GENERAL: 71 year-old male; NAD HEENT: NCAT, PERRLA, EOMI; sclera clear; no xanthelasma NECK: palpable bilateral carotid pulses, no bruits; no JVD; no TM LUNGS: CTA bilaterally CARDIAC: Irregularly irregular (S1, S2); no significant murmurs; no rubs or gallops ABDOMEN: soft, non-tender; intact BS EXTREMETIES: Trace bilateral peripheral edema SKIN: warm/dry; no obvious rash/lesions MUSCULOSKELETAL: no joint deformity NEURO: no focal deficit; NL affect   EKG: reviewed and available in Electronic Records   ASSESSMENT & PLAN:  Cardiomyopathy Following review with Dr. Hochrein, recommendation is as follows: Patient will be scheduled for an elective cardiac catheterization in the JV lab early next week, to rule out significant occult  CAD. Given recent therapeutic INR, Coumadin is to be placed on hold, and we will repeat labs next Monday. If his INR is less than 1.5, then we will plan to proceed with catheterization the following day. We also will discontinue amiodarone, so as to avoid the possibility of spontaneous cardioversion to NSR, while off anticoagulation. Following the procedure, patient is to resume Coumadin and followup here in our [facility]madin clinic later next week. We'll also arrange for him to return to Dr. Hochrein next week, as well, to discuss subsequent therapeutic options for his symptomatic atrial fibrillation. Of note, patient is to keep his scheduled device clinic followup here in our clinic tomorrow, with Dr. Allred.  Paroxysmal atrial fibrillation Plan as outlined above  Pacemaker-[employer] Patient to follow back here in our device clinic tomorrow, with Dr. Allred, as previously scheduled.  Left bundle branch block Chronic    [name], [name]  History and all data above reviewed.  Patient examined.  I agree with the findings as above.  Dyspnea as described.  Given the new cardiomyopathy and EKG changes cath is indicated.  Discussed with patient .  The patient understands that risks included but are not limited to stroke (1 in 1000), death (1 in 1000), kidney failure [usually temporary] (1 in 500), bleeding (1 in 200), allergic reaction [possibly serious] (1 in 200).  The patient understands and agrees to proceed. I think this should be done before cardioversion.  He can come off the warfarin for this and restart this or another agent after cath. The patient exam reveals COR:Irregular  ,  Lungs: Decreased breath sounds  ,  Abd: Positive bowel sounds, no rebound no guarding, Ext Moderate edema  .  All available labs, radiology testing, previous records reviewed. Agree with documented assessment and plan.   [name]  5:12 PM  08/10/2012 

## 2012-08-10 NOTE — Assessment & Plan Note (Signed)
Chronic. 

## 2012-08-10 NOTE — Patient Instructions (Addendum)
   Stop Amiodarone  Stop Coumadin - beginning this evening  Left JV heart cath - see info sheet given Continue all other current medications.  Follow up will be given at time of discharge

## 2012-08-10 NOTE — Assessment & Plan Note (Signed)
Plan as outlined above.

## 2012-08-10 NOTE — Telephone Encounter (Signed)
IN CLINICAL REVIEW WITH [facility]

## 2012-08-10 NOTE — Telephone Encounter (Signed)
Left JV heart cath - Tuesday, 9/17 - 7:30 - Sanjuana Kava

## 2012-08-11 ENCOUNTER — Ambulatory Visit (INDEPENDENT_AMBULATORY_CARE_PROVIDER_SITE_OTHER): Payer: Medicare PPO | Admitting: *Deleted

## 2012-08-11 DIAGNOSIS — I4891 Unspecified atrial fibrillation: Secondary | ICD-10-CM

## 2012-08-11 DIAGNOSIS — I495 Sick sinus syndrome: Secondary | ICD-10-CM

## 2012-08-11 DIAGNOSIS — I428 Other cardiomyopathies: Secondary | ICD-10-CM

## 2012-08-11 DIAGNOSIS — I429 Cardiomyopathy, unspecified: Secondary | ICD-10-CM

## 2012-08-11 DIAGNOSIS — I480 Paroxysmal atrial fibrillation: Secondary | ICD-10-CM

## 2012-08-11 LAB — PACEMAKER DEVICE OBSERVATION
AL AMPLITUDE: 5 mv
AL IMPEDENCE PM: 390 Ohm
BATTERY VOLTAGE: 2.79 V
RV LEAD IMPEDENCE PM: 397 Ohm
VENTRICULAR PACING PM: 1

## 2012-08-11 NOTE — Progress Notes (Signed)
Pacer check in clinic  

## 2012-08-15 ENCOUNTER — Encounter (HOSPITAL_BASED_OUTPATIENT_CLINIC_OR_DEPARTMENT_OTHER): Payer: Self-pay | Admitting: *Deleted

## 2012-08-15 ENCOUNTER — Inpatient Hospital Stay (HOSPITAL_BASED_OUTPATIENT_CLINIC_OR_DEPARTMENT_OTHER)
Admission: RE | Admit: 2012-08-15 | Discharge: 2012-08-15 | Disposition: A | Discharge status: Home / Self Care | Payer: Medicare PPO | Source: Ambulatory Visit | Attending: Cardiovascular Disease | Admitting: Cardiovascular Disease

## 2012-08-15 ENCOUNTER — Encounter (HOSPITAL_BASED_OUTPATIENT_CLINIC_OR_DEPARTMENT_OTHER)
Admission: RE | Disposition: A | Discharge status: Home / Self Care | Payer: Self-pay | Source: Ambulatory Visit | Attending: Cardiovascular Disease

## 2012-08-15 DIAGNOSIS — R0989 Other specified symptoms and signs involving the circulatory and respiratory systems: Secondary | ICD-10-CM | POA: Insufficient documentation

## 2012-08-15 DIAGNOSIS — R0602 Shortness of breath: Secondary | ICD-10-CM

## 2012-08-15 DIAGNOSIS — I4891 Unspecified atrial fibrillation: Secondary | ICD-10-CM | POA: Insufficient documentation

## 2012-08-15 DIAGNOSIS — R5381 Other malaise: Secondary | ICD-10-CM | POA: Insufficient documentation

## 2012-08-15 DIAGNOSIS — Z950 Presence of cardiac pacemaker: Secondary | ICD-10-CM | POA: Insufficient documentation

## 2012-08-15 DIAGNOSIS — I2510 Atherosclerotic heart disease of native coronary artery without angina pectoris: Secondary | ICD-10-CM | POA: Insufficient documentation

## 2012-08-15 DIAGNOSIS — I429 Cardiomyopathy, unspecified: Secondary | ICD-10-CM

## 2012-08-15 DIAGNOSIS — R0609 Other forms of dyspnea: Secondary | ICD-10-CM | POA: Insufficient documentation

## 2012-08-15 DIAGNOSIS — Z7901 Long term (current) use of anticoagulants: Secondary | ICD-10-CM | POA: Insufficient documentation

## 2012-08-15 DIAGNOSIS — I428 Other cardiomyopathies: Secondary | ICD-10-CM | POA: Insufficient documentation

## 2012-08-15 SURGERY — JV LEFT HEART CATHETERIZATION WITH CORONARY ANGIOGRAM
Anesthesia: LOCAL

## 2012-08-15 MED ORDER — ACETAMINOPHEN 325 MG PO TABS: 650.0000 mg | ORAL_TABLET | ORAL | Status: DC | PRN

## 2012-08-15 MED ORDER — ASPIRIN 81 MG PO CHEW
324.0000 mg | CHEWABLE_TABLET | ORAL | Status: AC
  Administered 2012-08-15: 324 mg via ORAL

## 2012-08-15 MED ORDER — SODIUM CHLORIDE 0.9 % IV SOLN: 250.0000 mL | INTRAVENOUS | Status: DC | PRN

## 2012-08-15 MED ORDER — SODIUM CHLORIDE 0.9 % IJ SOLN: 3.0000 mL | INTRAMUSCULAR | Status: DC | PRN

## 2012-08-15 MED ORDER — SODIUM CHLORIDE 0.9 % IV SOLN: INTRAVENOUS | Status: AC

## 2012-08-15 MED ORDER — SODIUM CHLORIDE 0.9 % IJ SOLN: 3.0000 mL | Freq: Two times a day (BID) | INTRAMUSCULAR | Status: DC

## 2012-08-15 MED ORDER — SODIUM CHLORIDE 0.9 % IV SOLN: INTRAVENOUS | Status: DC

## 2012-08-15 NOTE — CV Procedure (Signed)
   Cardiac Catheterization Operative Report  RAYCE BRAHMBHATT [id number] 9/17/20138:33 AM TAPPER,DAVID B, MD  Procedure Performed:  1. Left Heart Catheterization 2. Selective Coronary Angiography 3. Left ventricular pressures  Operator: Verne Carrow, MD  Indication: Pt with atrial fibrillation and new diagnosis of LV systolic dysfunction by echo with regional wall motion abnormalities. Pt has c/o weakness, fatigue and mild exertional dyspnea. Cardiac cath arranged by our [facility] office to exclude obstructive CAD.                                  Procedure Details: The risks, benefits, complications, treatment options, and expected outcomes were discussed with the patient. The patient and/or family concurred with the proposed plan, giving informed consent. The patient was brought to the outpatient cath lab after IV hydration was begun and oral premedication was given. The patient was further sedated with Versed and Fentanyl. The right groin was prepped and draped in the usual manner. Using the modified Seldinger access technique, a 4 French sheath was placed in the right femoral artery. Standard diagnostic catheters were used to perform selective coronary angiography. A pigtail catheter was used to measure LV pressures.   There were no immediate complications. The patient was taken to the recovery area in stable condition.   Hemodynamic Findings: Central aortic pressure: 121/67 Left ventricular pressure: 125/10/12  Angiographic Findings:  Left main: No obstructive disease.   Left Anterior Descending Artery: Large caliber vessel that courses to the apex. There are mild luminal irregularities in the mid segment. There are several very small diagonal branches.   Circumflex Artery: Moderate sized vessel that terminates into an obtuse marginal branch. There is a 20% stenosis in the mid segment of the marginal branch.   Right Coronary Artery: Large, dominant vessel with no obstructive  disease noted.   Left Ventricular Angiogram: Deferred.   Impression: 1. Mild non-obstructive coronary artery disease.  2. Non-ischemic cardiomyopathy  Recommendations: No further ischemic workup necessary. Will resume coumadin at previous dose. Continue medical management.        Complications:  None. The patient tolerated the procedure well.

## 2012-08-15 NOTE — OR Nursing (Signed)
Discharge instructions reviewed and signed, pt stated understanding, ambulated in hall without difficulty, site level 0, transported to friend's truck via wheelchair

## 2012-08-15 NOTE — OR Nursing (Signed)
Meal served 

## 2012-08-15 NOTE — OR Nursing (Signed)
Dr Clifton James at bedside to discuss results and treatment plan with pt and friend

## 2012-08-15 NOTE — Interval H&P Note (Signed)
History and Physical Interval Note:  08/15/2012 7:40 AM  [name]  has presented today for surgery, with the diagnosis of CAD  The various methods of treatment have been discussed with the patient and family. After consideration of risks, benefits and other options for treatment, the patient has consented to  Procedure(s) (LRB) with comments: JV LEFT HEART CATHETERIZATION WITH CORONARY ANGIOGRAM ([name]) as a surgical intervention .  The patient's history has been reviewed, patient examined, no change in status, stable for surgery.  I have reviewed the patient's chart and labs.  Questions were answered to the patient's satisfaction.     MCALHANY,CHRISTOPHER

## 2012-08-15 NOTE — H&P (View-Only) (Signed)
Primary Cardiologist: Lewayne Bunting, MD   HPI: Schedule followup for review of most recent echocardiogram result, per Dr. Margarita Mail recommendation.    - 2-D echo, 07/27/12, reviewed by Dr. Andee Lineman: EF 30-35%, multiple WMAs, grade 2 diastolic dysfunction, mild AR  Patient continues to experience mild exertional dyspnea since most recent OV, August 29, with Dr. Andee Lineman. He denies any interim exertional CP, and reportedly had 2 prior cardiac catheterizations, well over 10 years ago, in [place], [place] , which were negative for significant coronary disease. He generally feels "rotten", and has felt this way for the last few weeks. He denies any orthopnea, PND, LE edema, or tachycardia palpitations.  When last seen here in clinic, by Dr. Andee Lineman, he was started on Coumadin and amiodarone, in anticipation of proceeding with DCCV. His INR was 3.1 yesterday. However, given the results of his subsequent echocardiogram, reviewed by Dr. Andee Lineman, patient was scheduled today for review of these results, and for further recommendations.  Patient had prior echocardiograms in 2010, one here at [facility] in April indicating an EF of 45-50% with WMAs, and a repeat study at [facility] 2 months later suggesting an EF of 50-55%, with no WMAs.  EKG today indicates underlying atrial fibrillation at approximately 95 bpm with intermittent ventricular pacing  Allergies  Allergen Reactions  . Codeine     REACTION: muscle cramps  . Nutritional Supplements     BEE STINGS    Current Outpatient Prescriptions  Medication Sig Dispense Refill  . acetaminophen (TYLENOL) 325 MG tablet Take 650 mg by mouth as needed.      Marland Kitchen aspirin 81 MG tablet Take 81 mg by mouth 2 (two) times daily.        . metoprolol succinate (TOPROL-XL) 25 MG 24 hr tablet Take 1 tablet (25 mg total) by mouth 2 (two) times daily.  60 tablet  6  . Multiple Vitamin (MULTIVITAMIN) tablet Take 1 tablet by mouth daily.          Past Medical  History  Diagnosis Date  . Asthma   . Tachy-brady syndrome     s/p PPM (SJM) implanted by Dr Graciela Husbands 10/19/99, most recent gen change 04/03/08  . Paroxysmal atrial fibrillation   . Bronchitis     h/o   . Peptic ulcer disease   . History of recurrent UTIs   . Ruptured appendicitis     h/o  . Acute epididymitis     h/o    Past Surgical History  Procedure Date  . Pacemaker placement 03/2008    [employer] Jude, [place]. 5826    History   Social History  . Marital Status: Divorced    Spouse Name: [name]    Number of Children: [name]  . Years of Education: [name]   Occupational History  . Not on file.   Social History Main Topics  . Smoking status: Former Smoker -- 1.5 packs/day for 20 years    Types: Cigarettes    Quit date: 11/30/1977  . Smokeless tobacco: Never Used  . Alcohol Use: No  . Drug Use: No  . Sexually Active: Not on file   Other Topics Concern  . Not on file   Social History Narrative  . No narrative on file    No family history on file.  ROS: no nausea, vomiting; no fever, chills; no melena, hematochezia; no claudication  PHYSICAL EXAM: BP 127/80  Pulse 95  Ht 5\' 10"  (1.778 m)  Wt 211 lb (95.709 kg)  BMI 30.28  kg/m2  SpO2 96% GENERAL: 71 year-old male; NAD HEENT: NCAT, PERRLA, EOMI; sclera clear; no xanthelasma NECK: palpable bilateral carotid pulses, no bruits; no JVD; no TM LUNGS: CTA bilaterally CARDIAC: Irregularly irregular (S1, S2); no significant murmurs; no rubs or gallops ABDOMEN: soft, non-tender; intact BS EXTREMETIES: Trace bilateral peripheral edema SKIN: warm/dry; no obvious rash/lesions MUSCULOSKELETAL: no joint deformity NEURO: no focal deficit; NL affect   EKG: reviewed and available in Electronic Records   ASSESSMENT & PLAN:  Cardiomyopathy Following review with Dr. Antoine Poche, recommendation is as follows: Patient will be scheduled for an elective cardiac catheterization in the [facility] lab early next week, to rule out significant occult  CAD. Given recent therapeutic INR, Coumadin is to be placed on hold, and we will repeat labs next Monday. If his INR is less than 1.5, then we will plan to proceed with catheterization the following day. We also will discontinue amiodarone, so as to avoid the possibility of spontaneous cardioversion to NSR, while off anticoagulation. Following the procedure, patient is to resume Coumadin and followup here in our [facility] Coumadin clinic later next week. We'll also arrange for him to return to Dr. Antoine Poche next week, as well, to discuss subsequent therapeutic options for his symptomatic atrial fibrillation. Of note, patient is to keep his scheduled device clinic followup here in our clinic tomorrow, with Dr. Johney Frame.  Paroxysmal atrial fibrillation Plan as outlined above  Pacemaker-St.Jude Patient to follow back here in our device clinic tomorrow, with Dr. Johney Frame, as previously scheduled.  Left bundle branch block Chronic    Gene Kambria Grima, PAC  History and all data above reviewed.  Patient examined.  I agree with the findings as above.  Dyspnea as described.  Given the new cardiomyopathy and EKG changes cath is indicated.  Discussed with patient .  The patient understands that risks included but are not limited to stroke (1 in 1000), death (1 in 1000), kidney failure [usually temporary] (1 in 500), bleeding (1 in 200), allergic reaction [possibly serious] (1 in 200).  The patient understands and agrees to proceed. I think this should be done before cardioversion.  He can come off the warfarin for this and restart this or another agent after cath. The patient exam reveals NWG:[id number]  ,  Lungs: Decreased breath sounds  ,  Abd: Positive bowel sounds, no rebound no guarding, Ext Moderate edema  .  All available labs, radiology testing, previous records reviewed. Agree with documented assessment and plan.   Fayrene Fearing Hochrein  5:12 PM  08/10/2012

## 2012-08-15 NOTE — OR Nursing (Signed)
Tegaderm dressing applied, site level 0, bedrest begins at 0845 

## 2012-08-22 NOTE — Telephone Encounter (Signed)
COMPLETE SEE MEDIA MGR

## 2012-08-23 ENCOUNTER — Ambulatory Visit (INDEPENDENT_AMBULATORY_CARE_PROVIDER_SITE_OTHER): Payer: Medicare PPO | Admitting: Physician Assistant

## 2012-08-23 ENCOUNTER — Ambulatory Visit (INDEPENDENT_AMBULATORY_CARE_PROVIDER_SITE_OTHER): Payer: Medicare PPO | Admitting: *Deleted

## 2012-08-23 ENCOUNTER — Encounter: Payer: Self-pay | Admitting: Physician Assistant

## 2012-08-23 VITALS — BP 99/65 | HR 59 | Ht 70.0 in | Wt 211.4 lb

## 2012-08-23 DIAGNOSIS — Z7901 Long term (current) use of anticoagulants: Secondary | ICD-10-CM

## 2012-08-23 DIAGNOSIS — I4891 Unspecified atrial fibrillation: Secondary | ICD-10-CM

## 2012-08-23 DIAGNOSIS — I480 Paroxysmal atrial fibrillation: Secondary | ICD-10-CM

## 2012-08-23 DIAGNOSIS — Z950 Presence of cardiac pacemaker: Secondary | ICD-10-CM

## 2012-08-23 LAB — POCT INR: INR: 2

## 2012-08-23 MED ORDER — AMIODARONE HCL 200 MG PO TABS: 200.0000 mg | ORAL_TABLET | Freq: Every day | ORAL | Status: DC

## 2012-08-23 NOTE — Patient Instructions (Addendum)
   Resume Amiodarone 200mg  twice a day    Stop Aspirin Continue all other current medications.  INR today - 2.0 Will plan for Cardioversion in 4 weeks if INR greater than 2.0  Follow up in  1 month

## 2012-08-23 NOTE — Progress Notes (Signed)
Primary Cardiologist: Rollene Rotunda, MD    HPI: Patient returns in followup of recent elective cardiac catheterization, for evaluation of cardiomyopathy (EF 30-35%), with WMAs. Catheterization yielded mild, nonobstructive CAD. Patient was instructed to resume Coumadin.  Clinically, he reports no significant change from his baseline level of exercise tolerance. He has occasional, fleeting sharp chest pains. He still reports orthopnea and significant DOE, even walking on level ground. He denies any frank tachycardia palpitations.  He denies any complications of R groin incision site.  Allergies  Allergen Reactions  . Codeine     REACTION: muscle cramps  . Nutritional Supplements     BEE STINGS    Current Outpatient Prescriptions  Medication Sig Dispense Refill  . acetaminophen (TYLENOL) 325 MG tablet Take 650 mg by mouth as needed.      Marland Kitchen aspirin 81 MG tablet Take 81 mg by mouth 2 (two) times daily.        . metoprolol succinate (TOPROL-XL) 25 MG 24 hr tablet Take 1 tablet (25 mg total) by mouth 2 (two) times daily.  60 tablet  6  . Multiple Vitamin (MULTIVITAMIN) tablet Take 1 tablet by mouth daily.        Marland Kitchen warfarin (COUMADIN) 2.5 MG tablet Take 2.5 mg by mouth daily.      Marland Kitchen amiodarone (PACERONE) 200 MG tablet Take 1 tablet (200 mg total) by mouth daily.        Past Medical History  Diagnosis Date  . Asthma   . Tachy-brady syndrome     s/p PPM (SJM) implanted by Dr Graciela Husbands 10/19/99, most recent gen change 04/03/08  . Paroxysmal atrial fibrillation   . Bronchitis     h/o   . Peptic ulcer disease   . History of recurrent UTIs   . Ruptured appendicitis     h/o  . Acute epididymitis     h/o    Past Surgical History  Procedure Date  . Pacemaker placement 03/2008    St. Jude, [place]. 5826    History   Social History  . Marital Status: Divorced    Spouse Name: [name]    Number of Children: [name]  . Years of Education: [name]   Occupational History  . Not on file.   Social  History Main Topics  . Smoking status: Former Smoker -- 1.5 packs/day for 20 years    Types: Cigarettes    Quit date: 11/30/1977  . Smokeless tobacco: Never Used  . Alcohol Use: No  . Drug Use: No  . Sexually Active: Not on file   Other Topics Concern  . Not on file   Social History Narrative  . No narrative on file    No family history on file.  ROS: no nausea, vomiting; no fever, chills; no melena, hematochezia; no claudication  PHYSICAL EXAM: BP 99/65  Pulse 59  Ht 5\' 10"  (1.778 m)  Wt 211 lb 6.4 oz (95.89 kg)  BMI 30.33 kg/m2  SpO2 98% GENERAL: 71 year-old male; NAD  HEENT: NCAT, PERRLA, EOMI; sclera clear; no xanthelasma  NECK: palpable bilateral carotid pulses, no bruits; no JVD; no TM  LUNGS: CTA bilaterally  CARDIAC: Irregularly irregular (S1, S2); no significant murmurs; no rubs or gallops  ABDOMEN: soft, non-tender; intact BS  EXTREMETIES: Trace bilateral peripheral edema  SKIN: warm/dry; no obvious rash/lesions  MUSCULOSKELETAL: no joint deformity  NEURO: no focal deficit; NL affect    EKG:    ASSESSMENT & PLAN:  Cardiomyopathy Nonischemic, as assessed by recent cardiac  catheterization yielding mild nonobstructive disease. Patient does not present with evidence of volume overload or pulmonary edema. Therefore, do not recommend adding a diuretic to current medication regimen. Suspect now that this is most likely tachycardia mediated, secondary to uncontrolled atrial fibrillation. Therefore, plan is to restore NSR with DCCV in 4 weeks, followed by reassessment of LVF by echocardiography. If LVF still less than 35%, then we will refer him to our EP team for consideration of prophylactic ICD implantation.  Paroxysmal atrial fibrillation As outlined above, plan is to proceed with DCCV in 4 weeks, for restoration of NSR. We'll also resume amiodarone at 200 mg twice a day, to increase probability of chemical cardioversion. Of note, if unsuccessful may need to  consider RF ablation.  Encounter for long-term (current) use of anticoagulants Followed here in our clinic. Will check initial post catheterization followup INR level today, and establish weekly checks in preparation for DCCV in 4 weeks.  Pacemaker-St[employer]Jude Resume followup here in our [place] clinic, as scheduled    Gene Dariah Mcsorley, PAC

## 2012-08-23 NOTE — Assessment & Plan Note (Signed)
Followed here in our clinic. Will check initial post catheterization followup INR level today, and establish weekly checks in preparation for DCCV in 4 weeks.

## 2012-08-23 NOTE — Assessment & Plan Note (Addendum)
Resume followup here in our [place] clinic, as scheduled

## 2012-08-23 NOTE — Assessment & Plan Note (Signed)
Nonischemic, as assessed by recent cardiac catheterization yielding mild nonobstructive disease. Patient does not present with evidence of volume overload or pulmonary edema. Therefore, do not recommend adding a diuretic to current medication regimen. Suspect now that this is most likely tachycardia mediated, secondary to uncontrolled atrial fibrillation. Therefore, plan is to restore NSR with DCCV in 4 weeks, followed by reassessment of LVF by echocardiography. If LVF still less than 35%, then we will refer him to our EP team for consideration of prophylactic ICD implantation.

## 2012-08-23 NOTE — Assessment & Plan Note (Signed)
As outlined above, plan is to proceed with DCCV in 4 weeks, for restoration of NSR. We'll also resume amiodarone at 200 mg twice a day, to increase probability of chemical cardioversion. Of note, if unsuccessful may need to consider RF ablation.

## 2012-08-24 ENCOUNTER — Other Ambulatory Visit: Payer: Self-pay | Admitting: *Deleted

## 2012-08-24 MED ORDER — AMIODARONE HCL 200 MG PO TABS: 200.0000 mg | ORAL_TABLET | Freq: Two times a day (BID) | ORAL | Status: DC

## 2012-09-01 ENCOUNTER — Ambulatory Visit (INDEPENDENT_AMBULATORY_CARE_PROVIDER_SITE_OTHER): Payer: Medicare PPO | Admitting: *Deleted

## 2012-09-01 DIAGNOSIS — I4891 Unspecified atrial fibrillation: Secondary | ICD-10-CM

## 2012-09-01 DIAGNOSIS — I480 Paroxysmal atrial fibrillation: Secondary | ICD-10-CM

## 2012-09-01 DIAGNOSIS — Z7901 Long term (current) use of anticoagulants: Secondary | ICD-10-CM

## 2012-09-05 ENCOUNTER — Ambulatory Visit (INDEPENDENT_AMBULATORY_CARE_PROVIDER_SITE_OTHER): Payer: Medicare PPO | Admitting: *Deleted

## 2012-09-05 ENCOUNTER — Encounter: Payer: Self-pay | Admitting: *Deleted

## 2012-09-05 DIAGNOSIS — I4891 Unspecified atrial fibrillation: Secondary | ICD-10-CM

## 2012-09-05 DIAGNOSIS — Z7901 Long term (current) use of anticoagulants: Secondary | ICD-10-CM

## 2012-09-05 DIAGNOSIS — I480 Paroxysmal atrial fibrillation: Secondary | ICD-10-CM

## 2012-09-05 LAB — POCT INR: INR: 2.1

## 2012-09-05 NOTE — Progress Notes (Signed)
This encounter was created in error - please disregard.

## 2012-09-12 ENCOUNTER — Ambulatory Visit (INDEPENDENT_AMBULATORY_CARE_PROVIDER_SITE_OTHER): Payer: Medicare PPO | Admitting: *Deleted

## 2012-09-12 ENCOUNTER — Encounter: Payer: Self-pay | Admitting: Internal Medicine

## 2012-09-12 DIAGNOSIS — I4891 Unspecified atrial fibrillation: Secondary | ICD-10-CM

## 2012-09-12 DIAGNOSIS — I480 Paroxysmal atrial fibrillation: Secondary | ICD-10-CM

## 2012-09-12 DIAGNOSIS — Z7901 Long term (current) use of anticoagulants: Secondary | ICD-10-CM

## 2012-09-16 ENCOUNTER — Other Ambulatory Visit: Payer: Self-pay | Admitting: Cardiology

## 2012-09-18 ENCOUNTER — Telehealth: Payer: Self-pay | Admitting: Physician Assistant

## 2012-09-18 ENCOUNTER — Encounter: Payer: Self-pay | Admitting: *Deleted

## 2012-09-18 ENCOUNTER — Other Ambulatory Visit: Payer: Self-pay | Admitting: *Deleted

## 2012-09-18 ENCOUNTER — Telehealth: Payer: Self-pay

## 2012-09-18 DIAGNOSIS — Z79899 Other long term (current) drug therapy: Secondary | ICD-10-CM

## 2012-09-18 DIAGNOSIS — I4891 Unspecified atrial fibrillation: Secondary | ICD-10-CM

## 2012-09-18 NOTE — Telephone Encounter (Signed)
Pt has [employer].  No precert for the cardioversion.  Will they be doing a TEE w/cardioversion?  If so, TEE will need precert.  Please advise.

## 2012-09-18 NOTE — Telephone Encounter (Signed)
Mr. Hoggatt is at [facility] thinking he is suppose to have DCCV today.

## 2012-09-18 NOTE — Telephone Encounter (Signed)
DCCV scheduled for Thursday; October 24 at 11:00 with Dr. Kirke Corin at [facility].

## 2012-09-18 NOTE — Telephone Encounter (Signed)
DCCV scheduled for 10/24 at 11:00 with Dr. Kirke Corin - [facility].

## 2012-09-18 NOTE — Telephone Encounter (Signed)
Discussed below with patient.  Just thought he was supposed to go, could not remember.    Advised patient that I am currently working on this.  Trying to schedule for Thursday, 10/24 with Dr. Kirke Corin if can work out with MD & Maryruth Bun schedule.    Patient has coumadin check tomorrow with Misty Stanley.  Will discuss further at that time.

## 2012-09-19 ENCOUNTER — Ambulatory Visit (INDEPENDENT_AMBULATORY_CARE_PROVIDER_SITE_OTHER): Payer: Medicare PPO | Admitting: *Deleted

## 2012-09-19 DIAGNOSIS — I4891 Unspecified atrial fibrillation: Secondary | ICD-10-CM

## 2012-09-19 DIAGNOSIS — I480 Paroxysmal atrial fibrillation: Secondary | ICD-10-CM

## 2012-09-19 DIAGNOSIS — Z7901 Long term (current) use of anticoagulants: Secondary | ICD-10-CM

## 2012-09-19 LAB — POCT INR: INR: 3.3

## 2012-09-20 ENCOUNTER — Telehealth: Payer: Self-pay | Admitting: Physician Assistant

## 2012-09-20 NOTE — Telephone Encounter (Signed)
See below notes in reference to percert.

## 2012-09-20 NOTE — Telephone Encounter (Signed)
Message copied by Megan Salon on Wed Sep 20, 2012 10:37 AM ------      Message from: Lesle Chris      Created: Mon Sep 18, 2012  4:41 PM       Should just be plain DCCV, he has been therapeutic x 4 weeks & Misty Stanley is checking him again tomorrow morning.        Thanks.            ----- Message -----         From: Megan Salon         Sent: 09/18/2012   4:33 PM           To: Bradley Ferris, LPN            Pt has [employer].  No precert for the cardioversion.  Will they be doing a TEE w/cardioversion?  If so, TEE will need precert.  Please advise.      Inocencio Homes,  Do you know the answer to this question?      Thanks.

## 2012-09-21 DIAGNOSIS — I4891 Unspecified atrial fibrillation: Secondary | ICD-10-CM

## 2012-09-25 ENCOUNTER — Encounter: Payer: Self-pay | Admitting: Physician Assistant

## 2012-09-25 ENCOUNTER — Ambulatory Visit (INDEPENDENT_AMBULATORY_CARE_PROVIDER_SITE_OTHER): Payer: Medicare PPO | Admitting: Physician Assistant

## 2012-09-25 VITALS — BP 109/73 | HR 75 | Ht 70.0 in | Wt 212.0 lb

## 2012-09-25 DIAGNOSIS — I4891 Unspecified atrial fibrillation: Secondary | ICD-10-CM

## 2012-09-25 DIAGNOSIS — I480 Paroxysmal atrial fibrillation: Secondary | ICD-10-CM

## 2012-09-25 DIAGNOSIS — Z7901 Long term (current) use of anticoagulants: Secondary | ICD-10-CM

## 2012-09-25 DIAGNOSIS — Z9229 Personal history of other drug therapy: Secondary | ICD-10-CM

## 2012-09-25 DIAGNOSIS — I428 Other cardiomyopathies: Secondary | ICD-10-CM

## 2012-09-25 DIAGNOSIS — I429 Cardiomyopathy, unspecified: Secondary | ICD-10-CM

## 2012-09-25 NOTE — Progress Notes (Signed)
Primary Cardiologist: Rollene Rotunda, MD   HPI: Patient returns in followup of recent elective DCCV. Procedure performed by Dr. Kirke Corin on October 24th, who was unable to maintain restoration of NSR, despite 4 attempts with 200 J. He noted that the patient would be in NSR for at most 10 seconds, before reverting back to atrial fibrillation.  Patient presents today in NSR, as confirmed by EKG. Interestingly, he was unaware that he had reverted back to NSR. He does not note any significant symptomatic difference, although he states that people recently suggested that he "looked better". He has not, however, felt any tachycardia palpitations, which he states has been quite dramatic in the past.  12-lead EKG today indicates NSR 76 bpm with chronic LBBB  Allergies  Allergen Reactions  . Codeine     REACTION: muscle cramps  . Nutritional Supplements     BEE STINGS    Current Outpatient Prescriptions  Medication Sig Dispense Refill  . acetaminophen (TYLENOL) 325 MG tablet Take 650 mg by mouth as needed.      Marland Kitchen amiodarone (PACERONE) 200 MG tablet TAKE (1) TABLET TWICE DAILY.  60 tablet  3  . metoprolol succinate (TOPROL-XL) 25 MG 24 hr tablet Take 1 tablet (25 mg total) by mouth 2 (two) times daily.  60 tablet  6  . Multiple Vitamin (MULTIVITAMIN) tablet Take 1 tablet by mouth daily.        Marland Kitchen warfarin (COUMADIN) 2.5 MG tablet Take 2.5 mg by mouth daily.        Past Medical History  Diagnosis Date  . Asthma   . Tachy-brady syndrome     s/p PPM (SJM) implanted by Dr Graciela Husbands 10/19/99, most recent gen change 04/03/08  . Paroxysmal atrial fibrillation   . Bronchitis     h/o   . Peptic ulcer disease   . History of recurrent UTIs   . Ruptured appendicitis     h/o  . Acute epididymitis     h/o    Past Surgical History  Procedure Date  . Pacemaker placement 03/2008    St. Jude, [place]. 5826    History   Social History  . Marital Status: Divorced    Spouse Name: [name]    Number of  Children: [name]  . Years of Education: [name]   Occupational History  . Not on file.   Social History Main Topics  . Smoking status: Former Smoker -- 1.5 packs/day for 20 years    Types: Cigarettes    Quit date: 11/30/1977  . Smokeless tobacco: Never Used  . Alcohol Use: No  . Drug Use: No  . Sexually Active: Not on file   Other Topics Concern  . Not on file   Social History Narrative  . No narrative on file    No family history on file.  ROS: no nausea, vomiting; no fever, chills; no melena, hematochezia; no claudication  PHYSICAL EXAM: BP 109/73  Pulse 75  Ht 5\' 10"  (1.778 m)  Wt 212 lb (96.163 kg)  BMI 30.42 kg/m2  SpO2 96% GENERAL: 71 year-old male; NAD  HEENT: NCAT, PERRLA, EOMI; sclera clear; no xanthelasma  NECK: palpable bilateral carotid pulses, no bruits; no JVD; no TM  LUNGS: CTA bilaterally  CARDIAC: Irregularly irregular (S1, S2); no significant murmurs; no rubs or gallops  ABDOMEN: soft, non-tender; intact BS  EXTREMETIES: Trace bilateral peripheral edema  SKIN: warm/dry; no obvious rash/lesions  MUSCULOSKELETAL: no joint deformity  NEURO: no focal deficit; NL affect  EKG: reviewed and available in Electronic Records   ASSESSMENT & PLAN:  Paroxysmal atrial fibrillation Patient has spontaneously converted back to NSR, following recent failed DCCV attempt. He notes only marginal clinical improvement, and was unaware of that he had gone back in to NSR. It'll remain to be seen if he can maintain NSR. Therefore, I recommend that he continue on current amiodarone dose, as well as continue with Coumadin anticoagulation, pending further recommendations by Dr. Antoine Poche. Will arrange for early return followup with him, at which time he will need a repeat EKG to see if he is maintaining NSR. Of note, we'll order baseline PFTs.patient has had prior baseline blood work, including LFTs and TSH level.   Cardiomyopathy Patient remains euvolemic by history and exam.  We'll continue monitoring him off diuretic. Plan is still to reassess LVF, once NSR has been restored, to see if he has restoration of LV function, previously assessed at 30-35%. If this remains less than 35%, then he'll need to be referred to the EP team for consideration of prophylactic ICD implantation.  Long term (current) use of anticoagulants Will have patient followup with Vashti Hey of our Coumadin clinic at the end of this week, for continued monitoring and management of his INR levels. Will adjust downward slightly to 2.5 mg daily, except none on Tues/Thurs of this week, in light of recent INR data points of 2.9 and, most recently, 3.3.    [name], PAC

## 2012-09-25 NOTE — Patient Instructions (Addendum)
   PFT (pulmonary function test)  Take Coumadin 2.5mg  this evening, hold Tuesday evening, 2.5mg  Wednesday evening, hold Thursday evening, & check INR with Misty Stanley on Friday  Continue all other current medications.  Follow up in  1 month

## 2012-09-25 NOTE — Assessment & Plan Note (Signed)
Patient remains euvolemic by history and exam. We'll continue monitoring him off diuretic. Plan is still to reassess LVF, once NSR has been restored, to see if he has restoration of LV function, previously assessed at 30-35%. If this remains less than 35%, then he'll need to be referred to the EP team for consideration of prophylactic ICD implantation.

## 2012-09-25 NOTE — Assessment & Plan Note (Signed)
Patient has spontaneously converted back to NSR, following recent failed DCCV attempt. He notes only marginal clinical improvement, and was unaware of that he had gone back in to NSR. It'll remain to be seen if he can maintain NSR. Therefore, I recommend that he continue on current amiodarone dose, as well as continue with Coumadin anticoagulation, pending further recommendations by Dr. Antoine Poche. Will arrange for early return followup with him, at which time he will need a repeat EKG to see if he is maintaining NSR. Of note, we'll order baseline PFTs.patient has had prior baseline blood work, including LFTs and TSH level.

## 2012-09-25 NOTE — Assessment & Plan Note (Signed)
Will have patient followup with Vashti Hey of our Coumadin clinic at the end of this week h, for continued monitoring and management of his INR levels. Will adjust downward slightly to 2.5/2.0 mg alternating daily, in light of recent INR data points of 2.9 and, most recently, 3.3.

## 2012-09-29 ENCOUNTER — Ambulatory Visit (INDEPENDENT_AMBULATORY_CARE_PROVIDER_SITE_OTHER): Payer: Medicare PPO | Admitting: *Deleted

## 2012-09-29 DIAGNOSIS — I480 Paroxysmal atrial fibrillation: Secondary | ICD-10-CM

## 2012-09-29 DIAGNOSIS — I4891 Unspecified atrial fibrillation: Secondary | ICD-10-CM

## 2012-09-29 DIAGNOSIS — Z7901 Long term (current) use of anticoagulants: Secondary | ICD-10-CM

## 2012-09-29 LAB — POCT INR: INR: 1.7

## 2012-10-03 ENCOUNTER — Ambulatory Visit (INDEPENDENT_AMBULATORY_CARE_PROVIDER_SITE_OTHER): Payer: Medicare PPO | Admitting: *Deleted

## 2012-10-03 DIAGNOSIS — I480 Paroxysmal atrial fibrillation: Secondary | ICD-10-CM

## 2012-10-03 DIAGNOSIS — I4891 Unspecified atrial fibrillation: Secondary | ICD-10-CM

## 2012-10-03 DIAGNOSIS — Z7901 Long term (current) use of anticoagulants: Secondary | ICD-10-CM

## 2012-10-03 LAB — POCT INR: INR: 2.1

## 2012-10-05 ENCOUNTER — Ambulatory Visit (HOSPITAL_COMMUNITY)
Admission: RE | Admit: 2012-10-05 | Discharge: 2012-10-05 | Disposition: A | Discharge status: Home / Self Care | Payer: Medicare PPO | Source: Ambulatory Visit | Attending: Physician Assistant | Admitting: Physician Assistant

## 2012-10-05 DIAGNOSIS — Z7901 Long term (current) use of anticoagulants: Secondary | ICD-10-CM | POA: Insufficient documentation

## 2012-10-05 DIAGNOSIS — J45909 Unspecified asthma, uncomplicated: Secondary | ICD-10-CM | POA: Insufficient documentation

## 2012-10-05 DIAGNOSIS — Z87891 Personal history of nicotine dependence: Secondary | ICD-10-CM | POA: Insufficient documentation

## 2012-10-05 DIAGNOSIS — Z9229 Personal history of other drug therapy: Secondary | ICD-10-CM | POA: Insufficient documentation

## 2012-10-05 DIAGNOSIS — I4891 Unspecified atrial fibrillation: Secondary | ICD-10-CM | POA: Insufficient documentation

## 2012-10-05 DIAGNOSIS — Z950 Presence of cardiac pacemaker: Secondary | ICD-10-CM | POA: Insufficient documentation

## 2012-10-05 DIAGNOSIS — I480 Paroxysmal atrial fibrillation: Secondary | ICD-10-CM

## 2012-10-05 DIAGNOSIS — I428 Other cardiomyopathies: Secondary | ICD-10-CM | POA: Insufficient documentation

## 2012-10-05 NOTE — Procedures (Signed)
NAMEWHITE, ENGELBERG               ACCOUNT NO.:  [id number]  MEDICAL RECORD NO.:  [id number]  LOCATION:  RESP                          FACILITY:  [facility]  PHYSICIAN:  Yazlyn Wentzel L. Juanetta Gosling, M.D.DATE OF BIRTH:  Nov 28, 1941  DATE OF PROCEDURE: DATE OF DISCHARGE:                           PULMONARY FUNCTION TEST   Reason for pulmonary function testing is paroxysmal atrial fibrillation. 1. Spirometry is normal. 2. Lung volumes are normal. 3. DLCO shows minimal reduction of DLCO which does correct when     ventilation is taken into account. 4. Airway resistance is somewhat high which may indicate the presence     of airflow obstruction, even though it does not show on spirometry. 5. Noting the patient's history, this study is consistent with     reactive airway disease.     Herma Uballe L. Juanetta Gosling, M.D.     [facility]/MEDQ  D:  10/05/2012  T:  10/05/2012  Job:  [id number]  cc:   Rozell Searing, PA-C

## 2012-10-10 ENCOUNTER — Ambulatory Visit (INDEPENDENT_AMBULATORY_CARE_PROVIDER_SITE_OTHER): Payer: Medicare PPO | Admitting: *Deleted

## 2012-10-10 DIAGNOSIS — I480 Paroxysmal atrial fibrillation: Secondary | ICD-10-CM

## 2012-10-10 DIAGNOSIS — Z7901 Long term (current) use of anticoagulants: Secondary | ICD-10-CM

## 2012-10-10 DIAGNOSIS — I4891 Unspecified atrial fibrillation: Secondary | ICD-10-CM

## 2012-10-10 LAB — PULMONARY FUNCTION TEST

## 2012-10-10 LAB — POCT INR: INR: 2.5

## 2012-10-17 ENCOUNTER — Ambulatory Visit (INDEPENDENT_AMBULATORY_CARE_PROVIDER_SITE_OTHER): Payer: Medicare PPO | Admitting: *Deleted

## 2012-10-17 DIAGNOSIS — I480 Paroxysmal atrial fibrillation: Secondary | ICD-10-CM

## 2012-10-17 DIAGNOSIS — I4891 Unspecified atrial fibrillation: Secondary | ICD-10-CM

## 2012-10-17 DIAGNOSIS — Z7901 Long term (current) use of anticoagulants: Secondary | ICD-10-CM

## 2012-10-20 ENCOUNTER — Other Ambulatory Visit: Payer: Self-pay | Admitting: Cardiology

## 2012-10-20 MED ORDER — WARFARIN SODIUM 2.5 MG PO TABS: 2.5000 mg | ORAL_TABLET | Freq: Every day | ORAL | Status: DC

## 2012-11-14 ENCOUNTER — Encounter: Payer: Medicare PPO | Admitting: Internal Medicine

## 2012-11-14 ENCOUNTER — Ambulatory Visit (INDEPENDENT_AMBULATORY_CARE_PROVIDER_SITE_OTHER): Payer: Medicare PPO | Admitting: *Deleted

## 2012-11-14 DIAGNOSIS — I480 Paroxysmal atrial fibrillation: Secondary | ICD-10-CM

## 2012-11-14 DIAGNOSIS — I4891 Unspecified atrial fibrillation: Secondary | ICD-10-CM

## 2012-11-14 DIAGNOSIS — Z7901 Long term (current) use of anticoagulants: Secondary | ICD-10-CM

## 2012-12-08 ENCOUNTER — Ambulatory Visit (INDEPENDENT_AMBULATORY_CARE_PROVIDER_SITE_OTHER): Payer: Medicare PPO | Admitting: *Deleted

## 2012-12-08 DIAGNOSIS — I4891 Unspecified atrial fibrillation: Secondary | ICD-10-CM

## 2012-12-08 DIAGNOSIS — I480 Paroxysmal atrial fibrillation: Secondary | ICD-10-CM

## 2012-12-08 DIAGNOSIS — Z7901 Long term (current) use of anticoagulants: Secondary | ICD-10-CM

## 2012-12-08 LAB — POCT INR: INR: 2.2

## 2012-12-14 ENCOUNTER — Other Ambulatory Visit: Payer: Self-pay | Admitting: Physician Assistant

## 2012-12-28 ENCOUNTER — Encounter: Payer: Medicare PPO | Admitting: Internal Medicine

## 2013-01-05 ENCOUNTER — Ambulatory Visit (INDEPENDENT_AMBULATORY_CARE_PROVIDER_SITE_OTHER): Payer: Medicare PPO | Admitting: *Deleted

## 2013-01-05 DIAGNOSIS — I4891 Unspecified atrial fibrillation: Secondary | ICD-10-CM

## 2013-01-05 DIAGNOSIS — Z7901 Long term (current) use of anticoagulants: Secondary | ICD-10-CM

## 2013-01-05 DIAGNOSIS — I480 Paroxysmal atrial fibrillation: Secondary | ICD-10-CM

## 2013-01-05 LAB — POCT INR: INR: 2.1

## 2013-02-13 ENCOUNTER — Ambulatory Visit (INDEPENDENT_AMBULATORY_CARE_PROVIDER_SITE_OTHER): Payer: Medicare PPO | Admitting: *Deleted

## 2013-02-13 ENCOUNTER — Other Ambulatory Visit: Payer: Self-pay | Admitting: Physician Assistant

## 2013-02-13 DIAGNOSIS — Z7901 Long term (current) use of anticoagulants: Secondary | ICD-10-CM

## 2013-02-13 DIAGNOSIS — I480 Paroxysmal atrial fibrillation: Secondary | ICD-10-CM

## 2013-02-13 DIAGNOSIS — I4891 Unspecified atrial fibrillation: Secondary | ICD-10-CM

## 2013-02-13 LAB — POCT INR: INR: 2.3

## 2013-02-27 ENCOUNTER — Encounter: Payer: Self-pay | Admitting: Internal Medicine

## 2013-02-27 ENCOUNTER — Ambulatory Visit (INDEPENDENT_AMBULATORY_CARE_PROVIDER_SITE_OTHER): Payer: Medicare PPO | Admitting: Internal Medicine

## 2013-02-27 VITALS — BP 122/64 | HR 75 | Ht 70.0 in | Wt 214.0 lb

## 2013-02-27 DIAGNOSIS — I428 Other cardiomyopathies: Secondary | ICD-10-CM

## 2013-02-27 DIAGNOSIS — I480 Paroxysmal atrial fibrillation: Secondary | ICD-10-CM

## 2013-02-27 DIAGNOSIS — I429 Cardiomyopathy, unspecified: Secondary | ICD-10-CM

## 2013-02-27 DIAGNOSIS — I4891 Unspecified atrial fibrillation: Secondary | ICD-10-CM

## 2013-02-27 DIAGNOSIS — R059 Cough, unspecified: Secondary | ICD-10-CM | POA: Insufficient documentation

## 2013-02-27 DIAGNOSIS — I495 Sick sinus syndrome: Secondary | ICD-10-CM

## 2013-02-27 DIAGNOSIS — R05 Cough: Secondary | ICD-10-CM

## 2013-02-27 DIAGNOSIS — Z950 Presence of cardiac pacemaker: Secondary | ICD-10-CM

## 2013-02-27 LAB — PACEMAKER DEVICE OBSERVATION
AL AMPLITUDE: 4.8 mv
BAMS-0001: 150 {beats}/min
DEVICE MODEL PM: 2109692
RV LEAD AMPLITUDE: 8.4 mv
RV LEAD IMPEDENCE PM: 379 Ohm
RV LEAD THRESHOLD: 1 V
VENTRICULAR PACING PM: 1.2

## 2013-02-27 NOTE — Assessment & Plan Note (Signed)
The patient's device was interrogated.  The information was reviewed. No changes were made in the programming.    

## 2013-02-27 NOTE — Assessment & Plan Note (Signed)
Ejection fraction had been noted to be 35% last summer. Reevaluation is recommended but not yet consummated.this would be important as he would need appropriate therapy for left ventricular dysfunction and consideration for device revision

## 2013-02-27 NOTE — Assessment & Plan Note (Signed)
Is 98% atrially paced with reasonable heart rate excursion

## 2013-02-27 NOTE — Progress Notes (Signed)
superficial Patient Care Team: Oley Balm. Margo Common, MD as PCP - General (Unknown Physician Specialty)   HPI  [name] is a 72 y.o. male Seen in followup for pacemaker implanted for tachybradycardia syndrome. He also has paroxysmal atrial fibrillation  on amiodarone.  He was cardioverted in October. Notably his heart rate monitor from his device demonstrated not persistent rather paroxysms of atrial fibrillation and when he was seen in the office a couple of weeks later he was in sinus rhythm. His histograms continue to demonstrate paroxysms of atrial fibrillation.  he's been unaware of any symptomatic atrial fibrillation since his cardioversion. He does complain of a dry hacking cough. He has noted some shortness of breath and some fatigue. He denies peripheral edema.  Echocardiogram summer 13 demonstrated an ejection fraction of 30-35%. ECG at that time demonstrated left bundle branch block. Reassessment of his left ventricular function had been planned but not consummated  Past Medical History  Diagnosis Date  . Asthma   . Tachy-brady syndrome     s/p PPM (SJM) implanted by Dr Graciela Husbands 10/19/99, most recent gen change 04/03/08  . Paroxysmal atrial fibrillation   . Bronchitis     h/o   . Peptic ulcer disease   . History of recurrent UTIs   . Ruptured appendicitis     h/o  . Acute epididymitis     h/o    Past Surgical History  Procedure Laterality Date  . Pacemaker placement  03/2008    [employer] Jude, [place]. 1610    Current Outpatient Prescriptions  Medication Sig Dispense Refill  . acetaminophen (TYLENOL) 325 MG tablet Take 650 mg by mouth as needed.      Marland Kitchen amiodarone (PACERONE) 200 MG tablet TAKE (1) TABLET TWICE DAILY.  60 tablet  1  . metoprolol succinate (TOPROL-XL) 25 MG 24 hr tablet Take 1 tablet (25 mg total) by mouth 2 (two) times daily.  60 tablet  6  . warfarin (COUMADIN) 2.5 MG tablet TAKE (1) TABLET BY MOUTH ONCE DAILY.  30 tablet  3   No current facility-administered  medications for this visit.    Allergies  Allergen Reactions  . Codeine     REACTION: muscle cramps  . Corn-Containing Products   . Nutritional Supplements     BEE STINGS    Review of Systems negative except from HPI and PMH  Physical Exam BP 122/64  Pulse 75  Ht 5\' 10"  (1.778 m)  Wt 214 lb (97.07 kg)  BMI 30.71 kg/m2  SpO2 95% Well developed and well nourished in no acute distress HENT normal E scleral and icterus clear Neck Supple JVP flat; carotids brisk and full Clear to ausculation  Regular rate and rhythm, no murmurs gallops or rub Soft with active bowel sounds No clubbing cyanosis none Edema Alert and oriented, grossly normal motor and sensory function Skin Warm and Dry    Assessment and  Plan

## 2013-02-27 NOTE — Assessment & Plan Note (Signed)
He has paroxysmal atrial fibrillation. Histogram data from his device suggested this is an ongoing issue despite the lack of symptoms. This BX the question as to whether amiodarone remains required. One issue, the absence of symptoms, it is is there false positive identification of atrial fibrillation .REprogramming of his device to allow [place] to detect recurrent episodes of going forward.  It was my recommendation that he decrease his amiodarone from 400--200 mg a day. He mentioned that he was reluctant to do that his Dr GD and saved his life by uptitrating his amiodarone.   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

## 2013-02-27 NOTE — Patient Instructions (Signed)
Your physician wants you to follow-up in: 6 months with Kristin/Paula for a device check & 1 year with Dr. Klein. You will receive a reminder letter in the mail two months in advance. If you don't receive a letter, please call our office to schedule the follow-up appointment.  Your physician recommends that you continue on your current medications as directed. Please refer to the Current Medication list given to you today.  

## 2013-03-02 ENCOUNTER — Other Ambulatory Visit: Payer: Self-pay | Admitting: Cardiology

## 2013-03-27 ENCOUNTER — Telehealth: Payer: Self-pay | Admitting: Cardiology

## 2013-03-27 ENCOUNTER — Ambulatory Visit (INDEPENDENT_AMBULATORY_CARE_PROVIDER_SITE_OTHER): Payer: Medicare PPO | Admitting: *Deleted

## 2013-03-27 DIAGNOSIS — I480 Paroxysmal atrial fibrillation: Secondary | ICD-10-CM

## 2013-03-27 DIAGNOSIS — I4891 Unspecified atrial fibrillation: Secondary | ICD-10-CM

## 2013-03-27 DIAGNOSIS — Z7901 Long term (current) use of anticoagulants: Secondary | ICD-10-CM

## 2013-03-27 NOTE — Telephone Encounter (Signed)
This patient is asking to become your patient.  He was scheduled to see Dr Andee Lineman on May 23rd.  May I schedule this patient with you?

## 2013-03-27 NOTE — Telephone Encounter (Signed)
Yes= please get records

## 2013-03-29 ENCOUNTER — Encounter: Payer: Self-pay | Admitting: Cardiology

## 2013-03-29 ENCOUNTER — Ambulatory Visit (INDEPENDENT_AMBULATORY_CARE_PROVIDER_SITE_OTHER): Payer: Medicare PPO | Admitting: Cardiology

## 2013-03-29 VITALS — BP 109/76 | HR 75 | Ht 70.0 in | Wt 210.0 lb

## 2013-03-29 DIAGNOSIS — I428 Other cardiomyopathies: Secondary | ICD-10-CM

## 2013-03-29 DIAGNOSIS — I495 Sick sinus syndrome: Secondary | ICD-10-CM

## 2013-03-29 DIAGNOSIS — I480 Paroxysmal atrial fibrillation: Secondary | ICD-10-CM

## 2013-03-29 DIAGNOSIS — I429 Cardiomyopathy, unspecified: Secondary | ICD-10-CM

## 2013-03-29 DIAGNOSIS — Z950 Presence of cardiac pacemaker: Secondary | ICD-10-CM

## 2013-03-29 DIAGNOSIS — I4891 Unspecified atrial fibrillation: Secondary | ICD-10-CM

## 2013-03-29 DIAGNOSIS — I447 Left bundle-branch block, unspecified: Secondary | ICD-10-CM

## 2013-03-29 MED ORDER — AMIODARONE HCL 200 MG PO TABS: 200.0000 mg | ORAL_TABLET | Freq: Every day | ORAL | Status: DC

## 2013-03-29 NOTE — Progress Notes (Signed)
   Clinical Summary [name] is a 72 y.o.male most recently seen in the office by Dr. Graciela Husbands in early April. He is a former patient of Dr. Andee Lineman, this is my first meeting with him in the office. Records were reviewed.  He reports only occasional sense of weakness that tends to correlate with his atrial fibrillation, perhaps once every 3 or 4 months. He has known PAF based on device interrogation. Medications are reviewed below. I reviewed Dr. Odessa Fleming recent recommendations.  Echocardiogram from September 2013 revealed LVEF 30-35% with normal chamber size, grade 2 diastolic dysfunction, mild biatrial enlargement.  ECG today shows an atrial paced rhythm with left bundle branch block.  Allergies  Allergen Reactions  . Codeine     REACTION: muscle cramps  . Corn-Containing Products   . Nutritional Supplements     BEE STINGS    Current Outpatient Prescriptions  Medication Sig Dispense Refill  . acetaminophen (TYLENOL) 325 MG tablet Take 650 mg by mouth as needed.      Marland Kitchen amiodarone (PACERONE) 200 MG tablet Take 1 tablet (200 mg total) by mouth daily.  30 tablet  6  . metoprolol succinate (TOPROL-XL) 25 MG 24 hr tablet TAKE (1) TABLET TWICE DAILY.  60 tablet  6  . warfarin (COUMADIN) 2.5 MG tablet TAKE (1) TABLET BY MOUTH ONCE DAILY.  30 tablet  3   No current facility-administered medications for this visit.    Past Medical History  Diagnosis Date  . Asthma   . Tachy-brady syndrome     PPM (SJM) implanted by Dr Graciela Husbands 10/19/99, most recent gen change 04/03/08  . Paroxysmal atrial fibrillation   . Bronchitis     h/o   . Peptic ulcer disease   . History of recurrent UTIs   . History of ruptured appendix   . History of epididymitis   . 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 Pacemaker placement  03/2008    [employer] Jude, [place]. 47    Social History [name] reports that he quit smoking about 35 years ago. His  smoking use included Cigarettes. He has a 30 pack-year smoking history. He has never used smokeless tobacco. [name] reports that he does not drink alcohol.  Review of Systems No bleeding episodes with Coumadin. No syncope. Stable appetite. No orthopnea or PND. No edema. Otherwise negative.  Physical Examination Filed Vitals:   03/29/13 1108  BP: 109/76  Pulse: 75   Filed Weights   03/29/13 1108  Weight: 210 lb (95.255 kg)   Overweight male in no acute distress. HEENT: Conjunctiva and lids normal, oropharynx clear. Neck: Supple, no elevated JVP or carotid bruits, no thyromegaly. Lungs: Diminished but clear to auscultation, nonlabored breathing at rest. Cardiac: Regular rate and rhythm, no S3, soft apical systolic murmur. Abdomen: Soft, nontender, protuberant, bowel sounds present. Extremities: No pitting edema, distal pulses 2+. Skin: Warm and dry. Musculoskeletal: No kyphosis. Neuropsychiatric: Alert and oriented x3, affect grossly appropriate.   Problem List and Plan   Paroxysmal atrial fibrillation Atrial pacing today. I would agree with Dr. Graciela Husbands that amiodarone should be cut back to 200 mg daily. Patient has considered this since his visit with Dr. Graciela Husbands and is also in agreement. Otherwise continue cardiac regimen.  Pacemaker-St.Jude Maintain followup with Dr. Graciela Husbands.  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 Plan will be followup echocardiogram for reassessment of LVEF.    Jonelle Sidle, M.D., F.A.C.C.

## 2013-03-29 NOTE — Patient Instructions (Addendum)
Your physician recommends that you schedule a follow-up appointment in: 6 months. You will receive a reminder letter in the mail in about 4 months reminding you to call and schedule your appointment. If you don't receive this letter, please contact our office. Your physician has recommended you make the following change in your medication: Decrease your amiodarone 200 mg to daily. Your pharmacy has already been notified. All your other medications will remain the same. Your physician has requested that you have an echocardiogram. Echocardiography is a painless test that uses sound waves to create images of your heart. It provides your doctor with information about the size and shape of your heart and how well your heart's chambers and valves are working. This procedure takes approximately one hour. There are no restrictions for this procedure.

## 2013-03-29 NOTE — Assessment & Plan Note (Signed)
Maintain followup with Dr. Graciela Husbands.

## 2013-03-29 NOTE — Assessment & Plan Note (Signed)
Atrial pacing today. I would agree with Dr. Graciela Husbands that amiodarone should be cut back to 200 mg daily. Patient has considered this since his visit with Dr. Graciela Husbands and is also in agreement. Otherwise continue cardiac regimen.

## 2013-03-29 NOTE — Assessment & Plan Note (Signed)
Possibly tachycardia mediated. Plan will be followup echocardiogram for reassessment of LVEF.

## 2013-04-10 ENCOUNTER — Ambulatory Visit (INDEPENDENT_AMBULATORY_CARE_PROVIDER_SITE_OTHER): Payer: Medicare PPO | Admitting: *Deleted

## 2013-04-10 DIAGNOSIS — I4891 Unspecified atrial fibrillation: Secondary | ICD-10-CM

## 2013-04-10 DIAGNOSIS — I480 Paroxysmal atrial fibrillation: Secondary | ICD-10-CM

## 2013-04-10 DIAGNOSIS — Z7901 Long term (current) use of anticoagulants: Secondary | ICD-10-CM

## 2013-04-10 LAB — POCT INR: INR: 2.4

## 2013-04-11 ENCOUNTER — Other Ambulatory Visit (INDEPENDENT_AMBULATORY_CARE_PROVIDER_SITE_OTHER): Payer: Medicare PPO

## 2013-04-11 ENCOUNTER — Other Ambulatory Visit: Payer: Self-pay

## 2013-04-11 DIAGNOSIS — I447 Left bundle-branch block, unspecified: Secondary | ICD-10-CM

## 2013-04-11 DIAGNOSIS — I429 Cardiomyopathy, unspecified: Secondary | ICD-10-CM

## 2013-04-11 DIAGNOSIS — I495 Sick sinus syndrome: Secondary | ICD-10-CM

## 2013-04-11 DIAGNOSIS — I4891 Unspecified atrial fibrillation: Secondary | ICD-10-CM

## 2013-04-16 ENCOUNTER — Telehealth: Payer: Self-pay | Admitting: *Deleted

## 2013-04-16 NOTE — Telephone Encounter (Signed)
Message copied by Eustace Moore on Mon Apr 16, 2013 10:37 AM ------      Message from: MCDOWELL, Illene Bolus      Created: Wed Apr 11, 2013 10:12 PM       Noted. LVEF now 35-40% range, borderline for any consideration of device revision. Not on ACE-I or ARB, but SBP relatively low at baseline. Recently stable clinically. No change for now. ------

## 2013-04-17 ENCOUNTER — Encounter: Payer: Self-pay | Admitting: *Deleted

## 2013-04-18 ENCOUNTER — Telehealth: Payer: Self-pay | Admitting: *Deleted

## 2013-04-18 NOTE — Telephone Encounter (Signed)
Patient informed. 

## 2013-04-18 NOTE — Telephone Encounter (Signed)
Message copied by Eustace Moore on Wed Apr 18, 2013  4:42 PM ------      Message from: MCDOWELL, Illene Bolus      Created: Wed Apr 11, 2013 10:12 PM       Noted. LVEF now 35-40% range, borderline for any consideration of device revision. Not on ACE-I or ARB, but SBP relatively low at baseline. Recently stable clinically. No change for now. ------

## 2013-05-01 ENCOUNTER — Telehealth: Payer: Self-pay | Admitting: *Deleted

## 2013-05-01 NOTE — Telephone Encounter (Signed)
Patient called to see why message was left on his phone from nurse. Nurse explained to patient that those messages were left on his phone prior to our actual conversation.

## 2013-05-08 ENCOUNTER — Ambulatory Visit (INDEPENDENT_AMBULATORY_CARE_PROVIDER_SITE_OTHER): Payer: Medicare PPO | Admitting: *Deleted

## 2013-05-08 DIAGNOSIS — I4891 Unspecified atrial fibrillation: Secondary | ICD-10-CM

## 2013-05-08 DIAGNOSIS — Z7901 Long term (current) use of anticoagulants: Secondary | ICD-10-CM

## 2013-05-08 DIAGNOSIS — I480 Paroxysmal atrial fibrillation: Secondary | ICD-10-CM

## 2013-05-08 LAB — POCT INR: INR: 1.9

## 2013-05-14 ENCOUNTER — Other Ambulatory Visit: Payer: Self-pay | Admitting: Physician Assistant

## 2013-05-29 ENCOUNTER — Ambulatory Visit (INDEPENDENT_AMBULATORY_CARE_PROVIDER_SITE_OTHER): Payer: Medicare PPO | Admitting: *Deleted

## 2013-05-29 DIAGNOSIS — I480 Paroxysmal atrial fibrillation: Secondary | ICD-10-CM

## 2013-05-29 DIAGNOSIS — Z7901 Long term (current) use of anticoagulants: Secondary | ICD-10-CM

## 2013-05-29 DIAGNOSIS — I4891 Unspecified atrial fibrillation: Secondary | ICD-10-CM

## 2013-06-19 ENCOUNTER — Ambulatory Visit (INDEPENDENT_AMBULATORY_CARE_PROVIDER_SITE_OTHER): Payer: Medicare PPO | Admitting: *Deleted

## 2013-06-19 DIAGNOSIS — I480 Paroxysmal atrial fibrillation: Secondary | ICD-10-CM

## 2013-06-19 DIAGNOSIS — I4891 Unspecified atrial fibrillation: Secondary | ICD-10-CM

## 2013-06-19 DIAGNOSIS — Z7901 Long term (current) use of anticoagulants: Secondary | ICD-10-CM

## 2013-07-10 ENCOUNTER — Ambulatory Visit (INDEPENDENT_AMBULATORY_CARE_PROVIDER_SITE_OTHER): Payer: Medicare PPO | Admitting: *Deleted

## 2013-07-10 DIAGNOSIS — I480 Paroxysmal atrial fibrillation: Secondary | ICD-10-CM

## 2013-07-10 DIAGNOSIS — I4891 Unspecified atrial fibrillation: Secondary | ICD-10-CM

## 2013-07-10 DIAGNOSIS — Z7901 Long term (current) use of anticoagulants: Secondary | ICD-10-CM

## 2013-08-07 ENCOUNTER — Ambulatory Visit (INDEPENDENT_AMBULATORY_CARE_PROVIDER_SITE_OTHER): Payer: Medicare PPO | Admitting: *Deleted

## 2013-08-07 DIAGNOSIS — Z7901 Long term (current) use of anticoagulants: Secondary | ICD-10-CM

## 2013-08-07 DIAGNOSIS — I480 Paroxysmal atrial fibrillation: Secondary | ICD-10-CM

## 2013-08-07 DIAGNOSIS — I4891 Unspecified atrial fibrillation: Secondary | ICD-10-CM

## 2013-08-07 LAB — POCT INR: INR: 2.2

## 2013-08-27 ENCOUNTER — Other Ambulatory Visit: Payer: Self-pay | Admitting: *Deleted

## 2013-08-27 MED ORDER — WARFARIN SODIUM 2.5 MG PO TABS: 2.5000 mg | ORAL_TABLET | ORAL | Status: DC

## 2013-09-03 ENCOUNTER — Encounter: Payer: Self-pay | Admitting: Cardiology

## 2013-09-03 ENCOUNTER — Other Ambulatory Visit: Payer: Self-pay | Admitting: Internal Medicine

## 2013-09-03 ENCOUNTER — Ambulatory Visit (INDEPENDENT_AMBULATORY_CARE_PROVIDER_SITE_OTHER): Payer: Medicare PPO | Admitting: Cardiology

## 2013-09-03 VITALS — BP 118/75 | HR 75 | Ht 70.0 in | Wt 210.4 lb

## 2013-09-03 DIAGNOSIS — I428 Other cardiomyopathies: Secondary | ICD-10-CM

## 2013-09-03 DIAGNOSIS — I429 Cardiomyopathy, unspecified: Secondary | ICD-10-CM

## 2013-09-03 DIAGNOSIS — Z950 Presence of cardiac pacemaker: Secondary | ICD-10-CM

## 2013-09-03 DIAGNOSIS — I4891 Unspecified atrial fibrillation: Secondary | ICD-10-CM

## 2013-09-03 DIAGNOSIS — I480 Paroxysmal atrial fibrillation: Secondary | ICD-10-CM

## 2013-09-03 DIAGNOSIS — R0602 Shortness of breath: Secondary | ICD-10-CM

## 2013-09-03 DIAGNOSIS — R059 Cough, unspecified: Secondary | ICD-10-CM

## 2013-09-03 DIAGNOSIS — Z5181 Encounter for therapeutic drug level monitoring: Secondary | ICD-10-CM

## 2013-09-03 DIAGNOSIS — R05 Cough: Secondary | ICD-10-CM

## 2013-09-03 DIAGNOSIS — Z79899 Other long term (current) drug therapy: Secondary | ICD-10-CM

## 2013-09-03 NOTE — Assessment & Plan Note (Signed)
He continues on Coumadin and amiodarone.

## 2013-09-03 NOTE — Patient Instructions (Addendum)
Your physician recommends that you schedule a follow-up appointment in: 1 month. Your physician recommends that you continue on your current medications as directed. Please refer to the Current Medication list given to you today. A chest x-ray takes a picture of the organs and structures inside the chest, including the heart, lungs, and blood vessels. This test can show several things, including, whether the heart is enlarges; whether fluid is building up in the lungs; and whether pacemaker / defibrillator leads are still in place. Please have this done at [facility]. Your physician recommends that you have lab work TSH and liver function test. Please have this done at [facility]                      [address] (across from [employer]) M-F 7:00 am to 6:00 pm  Sat 8:00 am to 12:00 Your physician has recommended that you have a pulmonary function test. Pulmonary Function Tests are a group of tests that measure how well air moves in and out of your lungs. You will be contacted about this appointment. Please contact Dr. Jackolyn Confer office this week for an appointment.

## 2013-09-03 NOTE — Assessment & Plan Note (Signed)
Most recent LVEF improved the range of 35-40%.

## 2013-09-03 NOTE — Progress Notes (Signed)
Clinical Summary [name] is a 72 y.o.male last seen in May. He states over the last few weeks he has been experiencing cough and productive sputum, no fevers or chills, also rhinorrhea and sinus drainage. Weight is stable compared to the last visit, and he reports no leg edema, no definite orthopnea.  Echocardiogram from September 2013 revealed LVEF 30-35% with normal chamber size, grade 2 diastolic dysfunction, mild biatrial enlargement. He had a followup study in May of this year that demonstrated LVEF 35-40% with diffuse hypokinesis, grade 1 diastolic dysfunction, mild left atrial enlargement, PASP 31 mm mercury.  No recent chest x-ray, PFTs, or lab work to address amiodarone use.  He has a history of asthmatic bronchitis, does feel that he wheezes at times. Not on any specific medical therapy. He has not seen Dr. Margo Common recently.   Allergies  Allergen Reactions  . Bee Venom     BEE STINGS  . Codeine     REACTION: muscle cramps  . Corn-Containing Products     Corn bread     Current Outpatient Prescriptions  Medication Sig Dispense Refill  . acetaminophen (TYLENOL) 325 MG tablet Take 650 mg by mouth as needed.      Marland Kitchen amiodarone (PACERONE) 200 MG tablet Take 1 tablet (200 mg total) by mouth daily.  30 tablet  6  . metoprolol succinate (TOPROL-XL) 25 MG 24 hr tablet TAKE (1) TABLET TWICE DAILY.  60 tablet  6  . warfarin (COUMADIN) 2.5 MG tablet Take 1 tablet (2.5 mg total) by mouth as directed.  30 tablet  3   No current facility-administered medications for this visit.    Past Medical History  Diagnosis Date  . Asthma   . Tachy-brady syndrome     PPM (SJM) implanted by Dr Graciela Husbands 10/19/99, most recent gen change 04/03/08  . Paroxysmal atrial fibrillation   . Bronchitis   . Peptic ulcer disease   . History of recurrent UTIs   . History of ruptured appendix   . History of epididymitis   . 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 Pacemaker placement  03/2008    St. Jude, [place]. 9    Social History [name] reports that he quit smoking about 35 years ago. His smoking use included Cigarettes. He has a 30 pack-year smoking history. He has never used smokeless tobacco. [name] reports that he does not drink alcohol.  Review of Systems Chronic problems with his memory. Negative except as outlined.  Physical Examination Filed Vitals:   09/03/13 1135  BP: 118/75  Pulse: 75   Filed Weights   09/03/13 1135  Weight: 210 lb 6.4 oz (95.437 kg)    Overweight male in no acute distress.  HEENT: Conjunctiva and lids normal, oropharynx clear.  Neck: Supple, no elevated JVP or carotid bruits, no thyromegaly.  Lungs: Diminished but clear to auscultation, nonlabored breathing at rest.  Cardiac: Regular rate and rhythm, no S3, soft apical systolic murmur.  Abdomen: Soft, nontender, protuberant, bowel sounds present.  Extremities: No pitting edema, distal pulses 2+.  Skin: Warm and dry.  Musculoskeletal: No kyphosis.  Neuropsychiatric: Alert and oriented x3, affect grossly appropriate.   Problem List and Plan   Cough Patient reports worsening over the last few weeks with productive sputum and other upper respiratory symptoms. He does have a confounding history of asthmatic bronchitis, but in light of his use of amiodarone,  this is certainly to be considered as a potential contributor. Dose was already cut back at the last visit. We will obtain a chest x-ray, PFTs, TSH and LFTs. I also encouraged him to see Dr. Margo Common in the meanwhile to see if a course of antibiotics and steroids might be indicated. We will see him back for review.  Cardiomyopathy Most recent LVEF improved the range of 35-40%.  Paroxysmal atrial fibrillation He continues on Coumadin and amiodarone.  Pacemaker-St.Jude Tachycardia-bradycardia syndrome, followed by Dr. Graciela Husbands.    Jonelle Sidle, M.D.,  F.A.C.C.

## 2013-09-03 NOTE — Assessment & Plan Note (Signed)
Patient reports worsening over the last few weeks with productive sputum and other upper respiratory symptoms. He does have a confounding history of asthmatic bronchitis, but in light of his use of amiodarone, this is certainly to be considered as a potential contributor. Dose was already cut back at the last visit. We will obtain a chest x-ray, PFTs, TSH and LFTs. I also encouraged him to see Dr. Margo Common in the meanwhile to see if a course of antibiotics and steroids might be indicated. We will see him back for review.

## 2013-09-03 NOTE — Assessment & Plan Note (Signed)
Tachycardia-bradycardia syndrome, followed by Dr. Graciela Husbands.

## 2013-09-06 ENCOUNTER — Ambulatory Visit (HOSPITAL_COMMUNITY)
Admission: RE | Admit: 2013-09-06 | Discharge: 2013-09-06 | Disposition: A | Discharge status: Home / Self Care | Payer: Medicare PPO | Source: Ambulatory Visit | Attending: Cardiology | Admitting: Cardiology

## 2013-09-06 DIAGNOSIS — R059 Cough, unspecified: Secondary | ICD-10-CM | POA: Insufficient documentation

## 2013-09-06 DIAGNOSIS — Z87891 Personal history of nicotine dependence: Secondary | ICD-10-CM | POA: Insufficient documentation

## 2013-09-06 DIAGNOSIS — Z950 Presence of cardiac pacemaker: Secondary | ICD-10-CM | POA: Insufficient documentation

## 2013-09-06 DIAGNOSIS — R0602 Shortness of breath: Secondary | ICD-10-CM | POA: Insufficient documentation

## 2013-09-06 DIAGNOSIS — R05 Cough: Secondary | ICD-10-CM | POA: Insufficient documentation

## 2013-09-10 ENCOUNTER — Telehealth: Payer: Self-pay | Admitting: Cardiology

## 2013-09-10 NOTE — Telephone Encounter (Signed)
Call and left message informing pt of results.

## 2013-09-10 NOTE — Telephone Encounter (Signed)
Message copied by Burnice Logan on Mon Sep 10, 2013  4:31 PM ------      Message from: MCDOWELL, Illene Bolus      Created: Fri Sep 07, 2013  8:25 AM       No infiltrates or effusions. Changes consistent with COPD. He does have PFTs pending. Copy to primary care provider as well. ------

## 2013-09-11 ENCOUNTER — Ambulatory Visit (HOSPITAL_COMMUNITY)
Admission: RE | Admit: 2013-09-11 | Discharge: 2013-09-11 | Disposition: A | Discharge status: Home / Self Care | Payer: Medicare PPO | Source: Ambulatory Visit | Attending: Cardiology | Admitting: Cardiology

## 2013-09-11 DIAGNOSIS — R062 Wheezing: Secondary | ICD-10-CM | POA: Insufficient documentation

## 2013-09-11 DIAGNOSIS — R0602 Shortness of breath: Secondary | ICD-10-CM | POA: Insufficient documentation

## 2013-09-11 DIAGNOSIS — R0609 Other forms of dyspnea: Secondary | ICD-10-CM | POA: Insufficient documentation

## 2013-09-11 DIAGNOSIS — R059 Cough, unspecified: Secondary | ICD-10-CM | POA: Insufficient documentation

## 2013-09-11 DIAGNOSIS — R05 Cough: Secondary | ICD-10-CM | POA: Insufficient documentation

## 2013-09-11 DIAGNOSIS — R0989 Other specified symptoms and signs involving the circulatory and respiratory systems: Secondary | ICD-10-CM | POA: Insufficient documentation

## 2013-09-11 LAB — BLOOD GAS, ARTERIAL
Acid-base deficit: 1 mmol/L (ref 0.0–2.0)
Bicarbonate: 22.3 mEq/L (ref 20.0–24.0)
O2 Saturation: 95 %
TCO2: 18.8 mmol/L (ref 0–100)
pCO2 arterial: 31.6 mmHg — ABNORMAL LOW (ref 35.0–45.0)

## 2013-09-11 MED ORDER — ALBUTEROL SULFATE (5 MG/ML) 0.5% IN NEBU
2.5000 mg | INHALATION_SOLUTION | Freq: Once | RESPIRATORY_TRACT | Status: AC
  Administered 2013-09-11: 2.5 mg via RESPIRATORY_TRACT

## 2013-09-12 LAB — HEPATIC FUNCTION PANEL
AST: 59 U/L — ABNORMAL HIGH (ref 0–37)
Albumin: 3.8 g/dL (ref 3.5–5.2)
Alkaline Phosphatase: 57 U/L (ref 39–117)
Bilirubin, Direct: 0.1 mg/dL (ref 0.0–0.3)
Indirect Bilirubin: 0.6 mg/dL (ref 0.0–0.9)
Total Bilirubin: 0.7 mg/dL (ref 0.3–1.2)
Total Protein: 6.5 g/dL (ref 6.0–8.3)

## 2013-09-12 NOTE — Procedures (Signed)
[name], [name]               ACCOUNT NO.:  [id number]  MEDICAL RECORD NO.:  [id number]  LOCATION:  RESP                          FACILITY:  [facility]  PHYSICIAN:  Tamber Burtch L. Juanetta Gosling, M.D.DATE OF BIRTH:  February 22, 1941  DATE OF PROCEDURE: DATE OF DISCHARGE:  09/11/2013                           PULMONARY FUNCTION TEST   Reason for pulmonary function testing is shortness of breath.  1. Spirometry shows minimal ventilatory defect without evidence of     airflow obstruction. 2. Lung volumes are normal. 3. DLCO is moderately reduced, but improved when ventilation is taken     into account. 4. Airway resistance is normal. 5. There is no significant bronchodilator improvement. 6. No definite cause of shortness of breath is seen on this pulmonary     function test.     Ramon Dredge L. Juanetta Gosling, M.D.     [facility]/MEDQ  D:  09/11/2013  T:  09/12/2013  Job:  [id number]  cc:   Wyvonnia Lora, MD Fax: [phone]

## 2013-09-14 ENCOUNTER — Telehealth: Payer: Self-pay | Admitting: *Deleted

## 2013-09-14 MED ORDER — AMIODARONE HCL 200 MG PO TABS: 100.0000 mg | ORAL_TABLET | Freq: Every day | ORAL | Status: DC

## 2013-09-14 NOTE — Telephone Encounter (Signed)
Message copied by Eustace Moore on Fri Sep 14, 2013  2:59 PM ------      Message from: MCDOWELL, Illene Bolus      Created: Fri Sep 14, 2013  8:21 AM       Isabelle Course, I was able to locate the PFT report which is copied below, also the recent blood gas on room air. He did have mild hypoxia by his ABG, PFTs showed some decrease in DLCO which needs to be followed on amiodarone, although the overall report was not overly concerning for definitive explanation for his shortness of breath. We did just decrease his amiodarone to 100 mg daily.,so let's continue to follow for now. If he has worsening symptoms, he should let us know.            NAMETAO, SATZ               ACCOUNT NO.:  [id number]             MEDICAL RECORD NO.:  [id number]             LOCATION:  RESP                          FACILITY:  [facility]             PHYSICIAN:  Edward L. Juanetta Gosling, M.D.DATE OF BIRTH:  Aug 18, 1941             DATE OF PROCEDURE:      DATE OF DISCHARGE:  09/11/2013                                      PULMONARY FUNCTION TEST             Reason for pulmonary function testing is shortness of breath.             1. Spirometry shows minimal ventilatory defect without evidence of          airflow obstruction.      2. Lung volumes are normal.      3. DLCO is moderately reduced, but improved when ventilation is taken          into account.      4. Airway resistance is normal.      5. There is no significant bronchodilator improvement.      6. No definite cause of shortness of breath is seen on this pulmonary          function test.             Ramon Dredge L. Juanetta Gosling, M.D.                   ------

## 2013-09-14 NOTE — Telephone Encounter (Signed)
Patient informed and verbalized understanding of plan. 

## 2013-09-14 NOTE — Telephone Encounter (Signed)
Message copied by Eustace Moore on Fri Sep 14, 2013  2:56 PM ------      Message from: MCDOWELL, Illene Bolus      Created: Wed Sep 12, 2013  9:17 AM       Reviewed. TSH is normal. He does have some mild LFT abnormalities with AST 59 and ALT 72. See if he would be willing to cut amiodarone back to 100 mg daily for now. Need to followup on PFTs. ------

## 2013-09-18 ENCOUNTER — Ambulatory Visit (INDEPENDENT_AMBULATORY_CARE_PROVIDER_SITE_OTHER): Payer: Medicare PPO | Admitting: *Deleted

## 2013-09-18 DIAGNOSIS — I480 Paroxysmal atrial fibrillation: Secondary | ICD-10-CM

## 2013-09-18 DIAGNOSIS — I4891 Unspecified atrial fibrillation: Secondary | ICD-10-CM

## 2013-09-18 DIAGNOSIS — Z7901 Long term (current) use of anticoagulants: Secondary | ICD-10-CM

## 2013-09-18 LAB — PULMONARY FUNCTION TEST

## 2013-10-04 ENCOUNTER — Ambulatory Visit (INDEPENDENT_AMBULATORY_CARE_PROVIDER_SITE_OTHER): Payer: Medicare PPO | Admitting: Cardiology

## 2013-10-04 ENCOUNTER — Encounter: Payer: Self-pay | Admitting: Cardiology

## 2013-10-04 VITALS — BP 108/70 | HR 72 | Ht 70.0 in | Wt 209.0 lb

## 2013-10-04 DIAGNOSIS — I480 Paroxysmal atrial fibrillation: Secondary | ICD-10-CM

## 2013-10-04 DIAGNOSIS — Z950 Presence of cardiac pacemaker: Secondary | ICD-10-CM

## 2013-10-04 DIAGNOSIS — I429 Cardiomyopathy, unspecified: Secondary | ICD-10-CM

## 2013-10-04 DIAGNOSIS — I428 Other cardiomyopathies: Secondary | ICD-10-CM

## 2013-10-04 DIAGNOSIS — I4891 Unspecified atrial fibrillation: Secondary | ICD-10-CM

## 2013-10-04 NOTE — Assessment & Plan Note (Signed)
LVEF most recently in the range of 35-40%, grade 1 diastolic dysfunction.

## 2013-10-04 NOTE — Patient Instructions (Signed)
Continue all current medications. Follow up in  4 months  

## 2013-10-04 NOTE — Assessment & Plan Note (Signed)
Followed by Dr. Graciela Husbands, last seen in April of this year.

## 2013-10-04 NOTE — Assessment & Plan Note (Signed)
For now we will continue amiodarone recently reduced to 100 mg daily, Toprol-XL, and Coumadin. Office followup arranged.

## 2013-10-04 NOTE — Progress Notes (Signed)
Clinical Summary [name] is a 72 y.o.male seen back in early October. Followup testing was arranged at that time, patient reporting cough and other upper respiratory symptoms although had history of asthmatic bronchitis as well as use of amiodarone.  Chest x-ray demonstrated changes consistent with COPD, no acute abnormalities however. ABG showed mild hypoxia on room air, PFTs showed some decrease in DLCO which needs to be followed on amiodarone, although the overall report was not overly worrisome. Amiodarone was decreased 100 mg daily empirically.  He states that he feels somewhat better, has not been coughing. Sometimes feels intermittently flushed in his face, no palpitations however. It has already been documented on device interrogation that he continues to manifest PAF.  Echocardiogram from September 2013 revealed LVEF 30-35% with normal chamber size, grade 2 diastolic dysfunction, mild biatrial enlargement. He had a followup study in May of this year that demonstrated LVEF 35-40% with diffuse hypokinesis, grade 1 diastolic dysfunction, mild left atrial enlargement, PASP 31 mm mercury.  Today we reviewed his medical regimen, and will continue low-dose amiodarone for now, although I did broach with him the possibility that we might ultimately stop this medication altogether and use a strategy of heart rate control and anticoagulation. He seems to recall trying a different medication in the past (reportedly with Dr. Johney Frame - could not actually see this documented however) that made him feel "terrible." Details are not clear. I am not aware that he has ever been on any other antiarrhythmics.   Allergies  Allergen Reactions  . Bee Venom     BEE STINGS  . Codeine     REACTION: muscle cramps  . Corn-Containing Products     Corn bread     Current Outpatient Prescriptions  Medication Sig Dispense Refill  . acetaminophen (TYLENOL) 325 MG tablet Take 650 mg by mouth as needed.      Marland Kitchen  amiodarone (PACERONE) 200 MG tablet Take 0.5 tablets (100 mg total) by mouth daily.  15 tablet  6  . metoprolol succinate (TOPROL-XL) 25 MG 24 hr tablet TAKE (1) TABLET TWICE DAILY.  60 tablet  6  . warfarin (COUMADIN) 2.5 MG tablet Take 1 tablet (2.5 mg total) by mouth as directed.  30 tablet  3   No current facility-administered medications for this visit.    Past Medical History  Diagnosis Date  . Asthma   . Tachy-brady syndrome     PPM (SJM) implanted by Dr Graciela Husbands 10/19/99, most recent gen change 04/03/08  . Paroxysmal atrial fibrillation   . Bronchitis   . Peptic ulcer disease   . History of recurrent UTIs   . History of ruptured appendix   . History of epididymitis   . 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 Pacemaker placement  03/2008    St. Jude, [place]. 61    Social History Mr. Bifulco reports that he quit smoking about 35 years ago. His smoking use included Cigarettes. He has a 30 pack-year smoking history. He has never used smokeless tobacco. [name] reports that he does not drink alcohol.  Review of Systems Negative except as outlined.   Physical Examination Filed Vitals:   10/04/13 1053  BP: 108/70  Pulse: 72   Filed Weights   10/04/13 1053  Weight: 209 lb (94.802 kg)    Overweight male in no acute distress.  HEENT: Conjunctiva and lids normal, oropharynx clear.  Neck: Supple,  no elevated JVP or carotid bruits, no thyromegaly.  Lungs: Diminished but clear to auscultation, nonlabored breathing at rest.  Cardiac: Regular rate and rhythm, no S3, soft apical systolic murmur.  Abdomen: Soft, nontender, protuberant, bowel sounds present.  Extremities: No pitting edema, distal pulses 2+.  Skin: Warm and dry.  Musculoskeletal: No kyphosis.  Neuropsychiatric: Alert and oriented x3, affect grossly appropriate.   Problem List and Plan   Paroxysmal atrial fibrillation For now we will  continue amiodarone recently reduced to 100 mg daily, Toprol-XL, and Coumadin. Office followup arranged.  Pacemaker-St.Jude Followed by Dr. Graciela Husbands, last seen in April of this year.  Cardiomyopathy LVEF most recently in the range of 35-40%, grade 1 diastolic dysfunction.    Jonelle Sidle, M.D., F.A.C.C.

## 2013-10-26 IMAGING — CR DG CHEST 2V
2 series · 2 of 2 positions shown · non-contrast
Comparison: 08/14/2012

CLINICAL DATA: Cough, shortness of breath, asthma, cardiomyopathy,
former smoker

EXAM:
CHEST  2 VIEW

[view not recorded (1 of 2)]
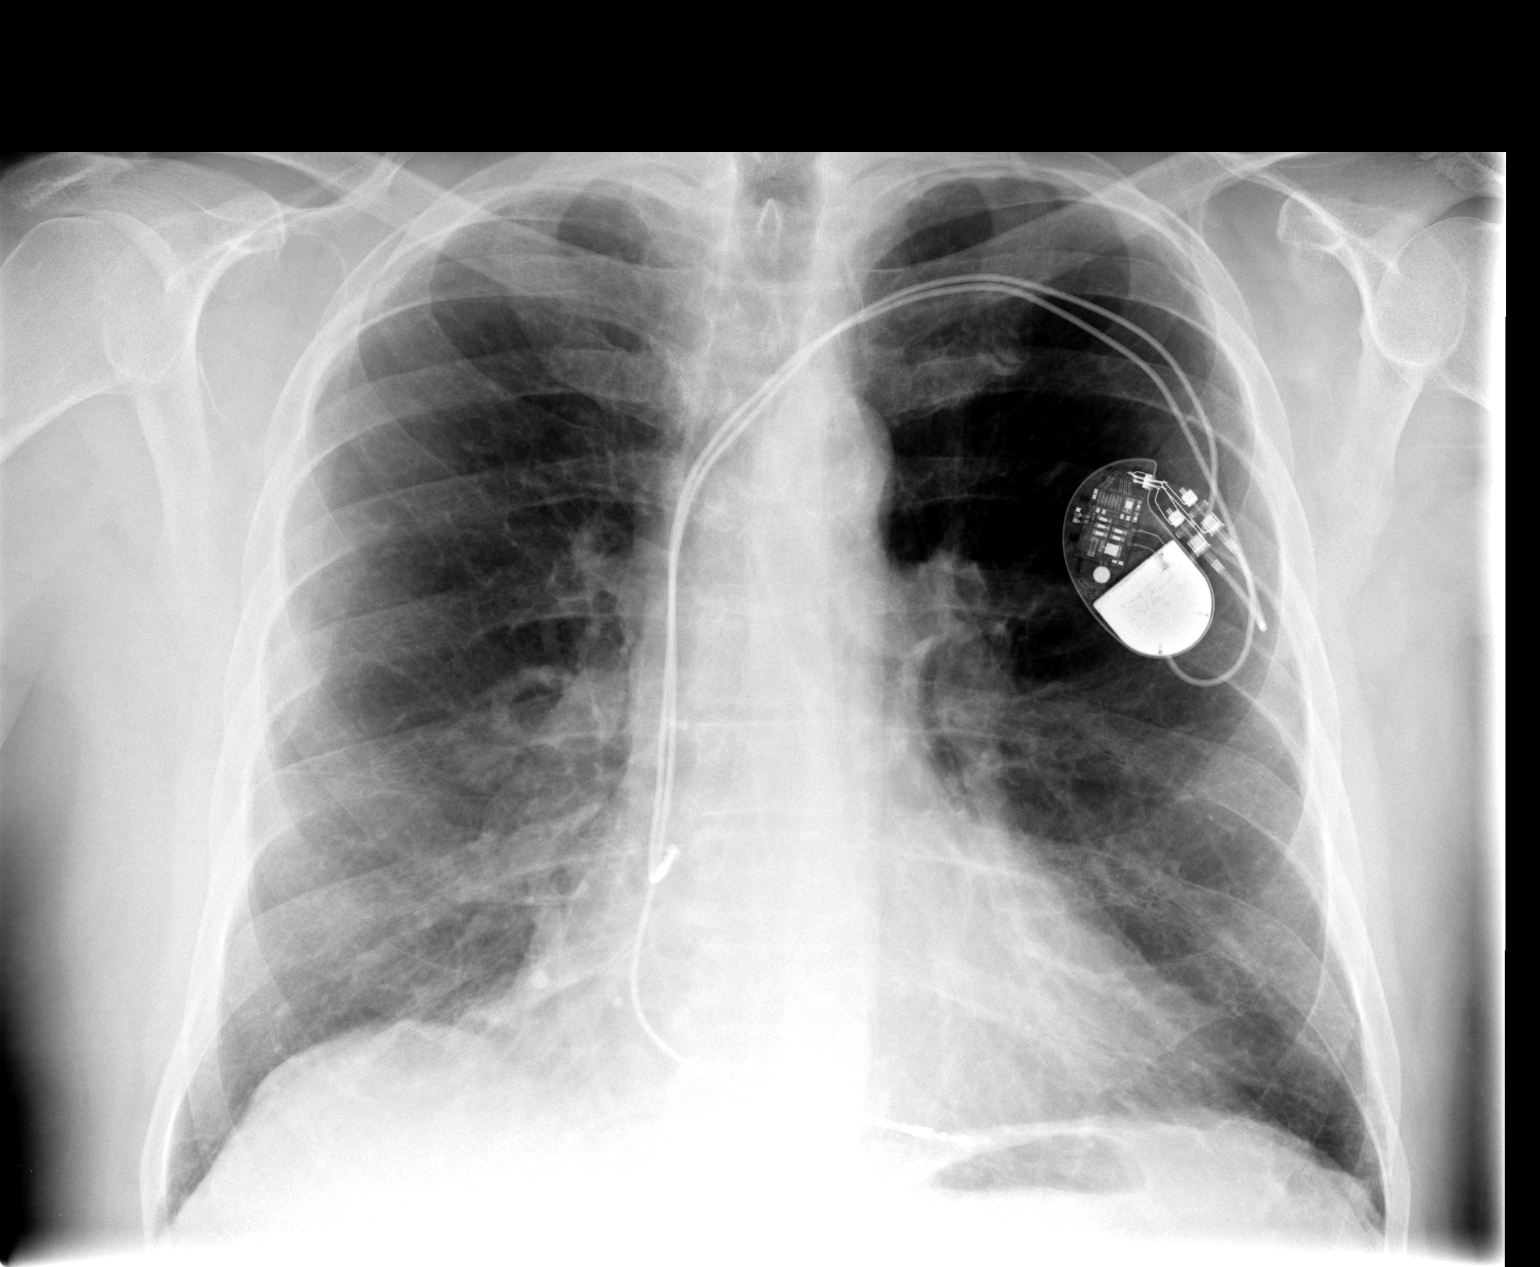

[view not recorded (2 of 2)]
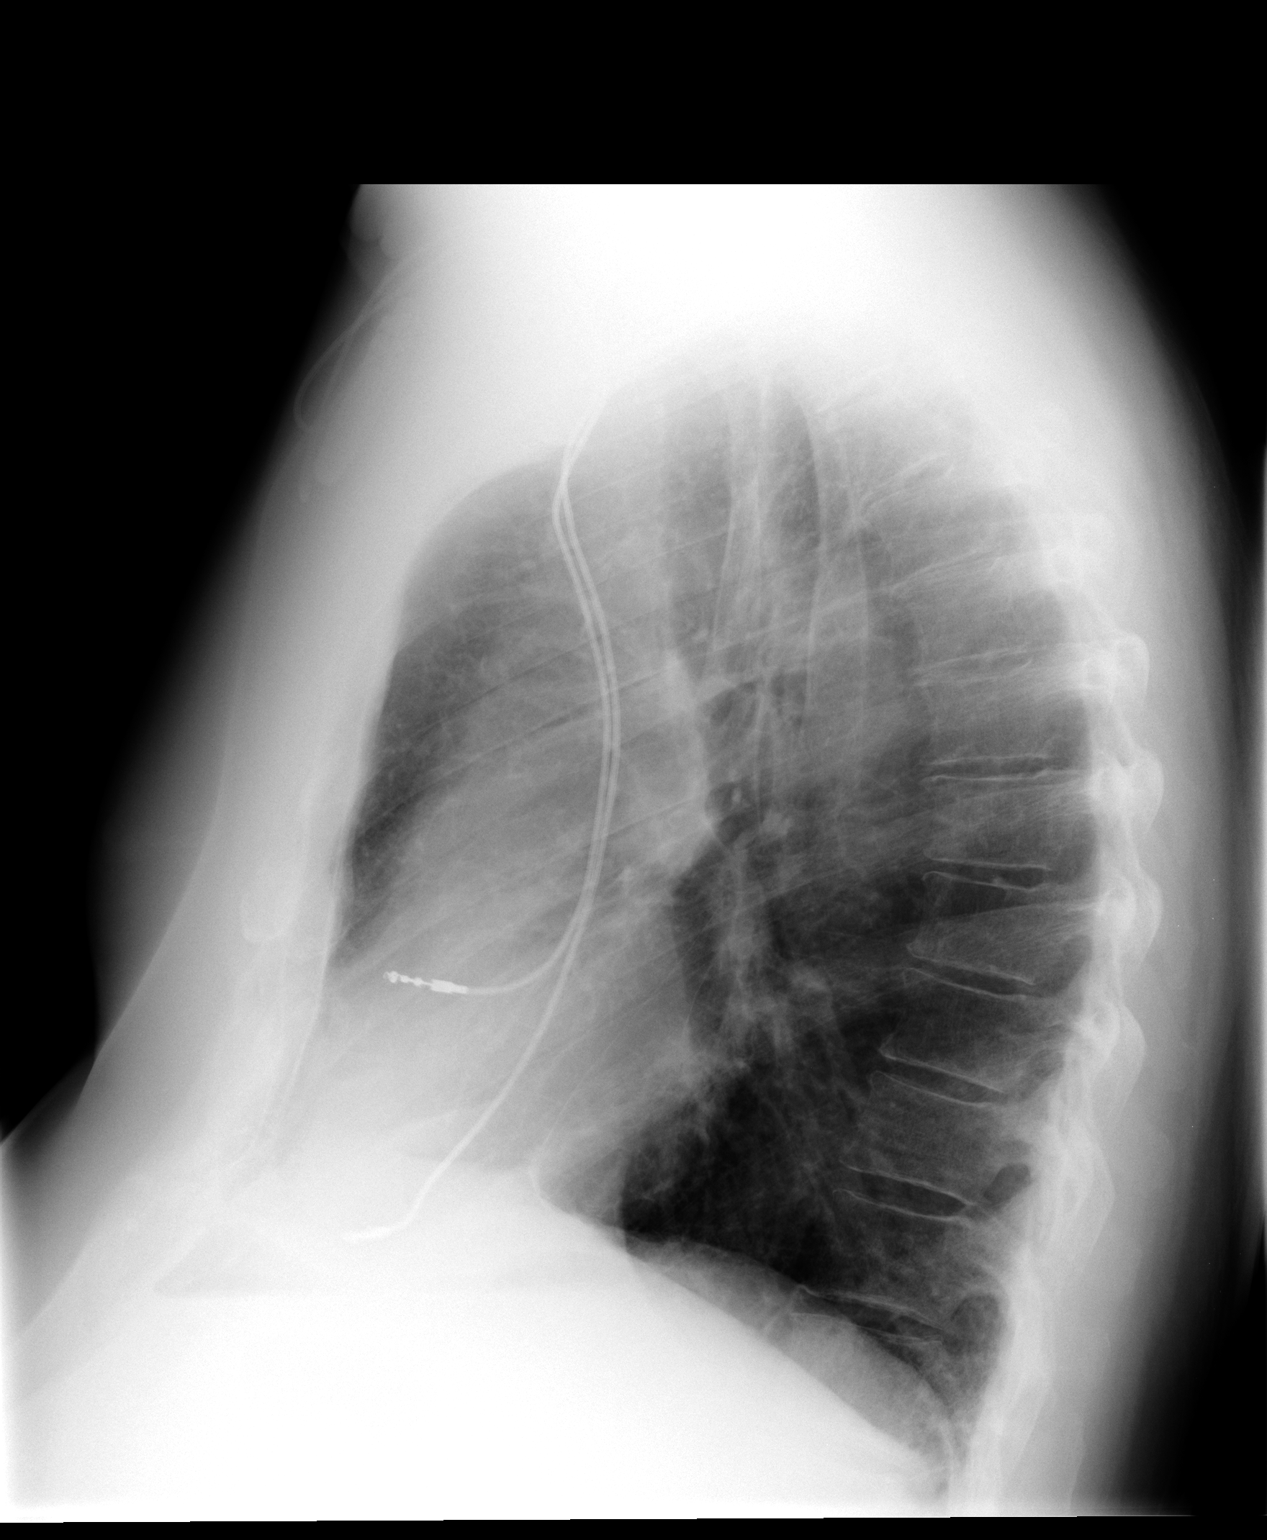

[2 of 2 positions shown; findings below may reference images not displayed]

FINDINGS: Left subclavian sequential transvenous pacemaker leads project at
right atrium and right ventricle.

Upper-normal size of cardiac silhouette.

Mediastinal contours and pulmonary vascularity normal.

Atherosclerotic calcification aorta.

Emphysematous changes without infiltrate, pleural effusion or
pneumothorax.

No acute osseous findings.
IMPRESSION: COPD changes.

Pacemaker.

No acute abnormalities.

## 2013-10-30 ENCOUNTER — Encounter: Payer: Self-pay | Admitting: Cardiology

## 2013-10-30 ENCOUNTER — Ambulatory Visit (INDEPENDENT_AMBULATORY_CARE_PROVIDER_SITE_OTHER): Payer: Medicare PPO | Admitting: *Deleted

## 2013-10-30 DIAGNOSIS — I4891 Unspecified atrial fibrillation: Secondary | ICD-10-CM

## 2013-10-30 DIAGNOSIS — Z7901 Long term (current) use of anticoagulants: Secondary | ICD-10-CM

## 2013-10-30 DIAGNOSIS — I480 Paroxysmal atrial fibrillation: Secondary | ICD-10-CM

## 2013-11-12 ENCOUNTER — Other Ambulatory Visit: Payer: Self-pay | Admitting: Cardiology

## 2013-12-11 ENCOUNTER — Ambulatory Visit (INDEPENDENT_AMBULATORY_CARE_PROVIDER_SITE_OTHER): Payer: Private Health Insurance - Indemnity | Admitting: *Deleted

## 2013-12-11 DIAGNOSIS — I4891 Unspecified atrial fibrillation: Secondary | ICD-10-CM

## 2013-12-11 DIAGNOSIS — I480 Paroxysmal atrial fibrillation: Secondary | ICD-10-CM

## 2013-12-11 DIAGNOSIS — Z7901 Long term (current) use of anticoagulants: Secondary | ICD-10-CM

## 2013-12-11 LAB — POCT INR: INR: 2.4

## 2014-01-29 ENCOUNTER — Ambulatory Visit (INDEPENDENT_AMBULATORY_CARE_PROVIDER_SITE_OTHER): Payer: Private Health Insurance - Indemnity | Admitting: *Deleted

## 2014-01-29 DIAGNOSIS — I4891 Unspecified atrial fibrillation: Secondary | ICD-10-CM

## 2014-01-29 DIAGNOSIS — I480 Paroxysmal atrial fibrillation: Secondary | ICD-10-CM

## 2014-01-29 DIAGNOSIS — Z7901 Long term (current) use of anticoagulants: Secondary | ICD-10-CM

## 2014-01-29 DIAGNOSIS — Z5181 Encounter for therapeutic drug level monitoring: Secondary | ICD-10-CM | POA: Insufficient documentation

## 2014-01-29 LAB — POCT INR: INR: 2.1

## 2014-02-07 ENCOUNTER — Ambulatory Visit: Payer: Medicare PPO | Admitting: Cardiology

## 2014-03-12 ENCOUNTER — Ambulatory Visit (INDEPENDENT_AMBULATORY_CARE_PROVIDER_SITE_OTHER): Payer: Private Health Insurance - Indemnity | Admitting: *Deleted

## 2014-03-12 DIAGNOSIS — Z5181 Encounter for therapeutic drug level monitoring: Secondary | ICD-10-CM

## 2014-03-12 DIAGNOSIS — I4891 Unspecified atrial fibrillation: Secondary | ICD-10-CM

## 2014-03-12 DIAGNOSIS — Z7901 Long term (current) use of anticoagulants: Secondary | ICD-10-CM

## 2014-03-12 DIAGNOSIS — I480 Paroxysmal atrial fibrillation: Secondary | ICD-10-CM

## 2014-03-12 LAB — POCT INR: INR: 2

## 2014-03-14 ENCOUNTER — Encounter: Payer: Self-pay | Admitting: *Deleted

## 2014-03-25 ENCOUNTER — Other Ambulatory Visit: Payer: Self-pay | Admitting: Cardiology

## 2014-03-25 ENCOUNTER — Other Ambulatory Visit: Payer: Self-pay | Admitting: *Deleted

## 2014-03-25 MED ORDER — WARFARIN SODIUM 2.5 MG PO TABS: 2.5000 mg | ORAL_TABLET | ORAL | Status: DC

## 2014-03-27 ENCOUNTER — Ambulatory Visit (INDEPENDENT_AMBULATORY_CARE_PROVIDER_SITE_OTHER): Payer: Private Health Insurance - Indemnity | Admitting: *Deleted

## 2014-03-27 DIAGNOSIS — I4891 Unspecified atrial fibrillation: Secondary | ICD-10-CM

## 2014-03-27 DIAGNOSIS — I480 Paroxysmal atrial fibrillation: Secondary | ICD-10-CM

## 2014-03-27 DIAGNOSIS — I495 Sick sinus syndrome: Secondary | ICD-10-CM

## 2014-03-27 DIAGNOSIS — I429 Cardiomyopathy, unspecified: Secondary | ICD-10-CM

## 2014-03-27 DIAGNOSIS — I447 Left bundle-branch block, unspecified: Secondary | ICD-10-CM

## 2014-03-27 DIAGNOSIS — I428 Other cardiomyopathies: Secondary | ICD-10-CM

## 2014-03-28 LAB — MDC_IDC_ENUM_SESS_TYPE_INCLINIC
Brady Statistic RA Percent Paced: 97 %
Brady Statistic RV Percent Paced: 1 % — CL
Implantable Pulse Generator Model: 5826
Implantable Pulse Generator Serial Number: 2109692
Lead Channel Impedance Value: 356 Ohm
Lead Channel Impedance Value: 437 Ohm
Lead Channel Pacing Threshold Amplitude: 0.625 V
Lead Channel Pacing Threshold Amplitude: 1.25 V
Lead Channel Pacing Threshold Pulse Width: 0.4 ms
Lead Channel Pacing Threshold Pulse Width: 0.4 ms
Lead Channel Sensing Intrinsic Amplitude: 8.3 mV
Lead Channel Setting Pacing Amplitude: 2.5 V
MDC IDC MSMT LEADCHNL RA SENSING INTR AMPL: 4.5 mV
MDC IDC SET LEADCHNL RV PACING PULSEWIDTH: 0.4 ms
MDC IDC SET LEADCHNL RV SENSING SENSITIVITY: 2 mV

## 2014-03-28 NOTE — Progress Notes (Signed)
Pacemaker check in clinic. Normal device function. Thresholds, sensing, impedances consistent with previous measurements. Device programmed to maximize longevity. 305 mode switches (1.9%)---max dur. 7 hours, Max A 591 + Warfarin. No high ventricular rates noted. Device programmed at appropriate safety margins. Histogram distribution appropriate for patient activity level. Device programmed to optimize intrinsic conduction. Estimated longevity 1.25-2.25 years. Patient will follow up with GT/R in 6 months---per pt request.

## 2014-04-15 ENCOUNTER — Encounter: Payer: Self-pay | Admitting: Internal Medicine

## 2014-04-16 ENCOUNTER — Ambulatory Visit: Payer: Private Health Insurance - Indemnity | Admitting: Cardiology

## 2014-04-18 ENCOUNTER — Ambulatory Visit (INDEPENDENT_AMBULATORY_CARE_PROVIDER_SITE_OTHER): Payer: Private Health Insurance - Indemnity | Admitting: Cardiology

## 2014-04-18 ENCOUNTER — Encounter: Payer: Self-pay | Admitting: Cardiology

## 2014-04-18 VITALS — BP 112/79 | HR 76 | Ht 70.0 in

## 2014-04-18 DIAGNOSIS — Z79899 Other long term (current) drug therapy: Secondary | ICD-10-CM

## 2014-04-18 DIAGNOSIS — I429 Cardiomyopathy, unspecified: Secondary | ICD-10-CM

## 2014-04-18 DIAGNOSIS — I495 Sick sinus syndrome: Secondary | ICD-10-CM

## 2014-04-18 DIAGNOSIS — Z5181 Encounter for therapeutic drug level monitoring: Secondary | ICD-10-CM

## 2014-04-18 DIAGNOSIS — I4891 Unspecified atrial fibrillation: Secondary | ICD-10-CM

## 2014-04-18 DIAGNOSIS — I428 Other cardiomyopathies: Secondary | ICD-10-CM

## 2014-04-18 DIAGNOSIS — I480 Paroxysmal atrial fibrillation: Secondary | ICD-10-CM

## 2014-04-18 NOTE — Assessment & Plan Note (Signed)
Check TSH and LFTs, remains on low-dose amiodarone.

## 2014-04-18 NOTE — Patient Instructions (Signed)
Your physician recommends that you schedule a follow-up appointment in: 6 months. You will receive a reminder letter in the mail in about 4 months reminding you to call and schedule your appointment. If you don't receive this letter, please contact our office. Your physician recommends that you continue on your current medications as directed. Please refer to the Current Medication list given to you today.  Your physician recommends that you have lab work to check your liver function and thyroid levels.

## 2014-04-18 NOTE — Assessment & Plan Note (Signed)
St. Jude pacemaker in place, keep followup in the device clinic. 

## 2014-04-18 NOTE — Progress Notes (Signed)
    Clinical Summary Mr. Cutrer is a 73 y.o.male last seen in November 2014. Last device interrogation was in April of this year.  States that he has actually had less shortness of breath and cough over the last few months, no palpitations. He reports compliance with his medications, and no bleeding problems on Coumadin.  Echocardiogram in May 2014 demonstrated LVEF 35-40% with diffuse hypokinesis, grade 1 diastolic dysfunction, mild left atrial enlargement, PASP 31 mm mercury.  He is due for followup LFTs and TSH.   Allergies  Allergen Reactions  . Bee Venom     BEE STINGS  . Codeine     REACTION: muscle cramps  . Corn-Containing Products     Corn bread     Current Outpatient Prescriptions  Medication Sig Dispense Refill  . acetaminophen (TYLENOL) 325 MG tablet Take 650 mg by mouth as needed.      Marland Kitchen amiodarone (PACERONE) 200 MG tablet Take 0.5 tablets (100 mg total) by mouth daily.  15 tablet  6  . Homeopathic Products (ALLERGY MEDICINE PO) Take by mouth as needed.      . metoprolol succinate (TOPROL-XL) 25 MG 24 hr tablet TAKE (1) TABLET TWICE DAILY.  60 tablet  6  . warfarin (COUMADIN) 2.5 MG tablet Take 1 tablet daily except 1 1/2 tablets on Tuesdays and Fridays  45 tablet  3  . warfarin (COUMADIN) 2.5 MG tablet Take 1 tablet (2.5 mg total) by mouth as directed.  30 tablet  3   No current facility-administered medications for this visit.    Past Medical History  Diagnosis Date  . Asthma   . Tachy-brady syndrome     PPM (SJM) implanted by Dr Graciela Husbands 10/19/99, most recent gen change 04/03/08  . Paroxysmal atrial fibrillation   . Bronchitis   . Peptic ulcer disease   . History of recurrent UTIs   . History of ruptured appendix   . History of epididymitis   . 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 Pacemaker placement  03/2008    St. Jude, [place]. 82    Social History Mr. Fruit reports that he quit  smoking about 36 years ago. His smoking use included Cigarettes. He has a 30 pack-year smoking history. He has never used smokeless tobacco. Mr. Benzon reports that he does not drink alcohol.  Review of Systems Having some trouble sleeping. No orthopnea or PND. No chest pain.  Physical Examination Filed Vitals:   04/18/14 1115  BP: 112/79  Pulse: 76   There were no vitals filed for this visit.  Overweight male in no acute distress.  HEENT: Conjunctiva and lids normal, oropharynx clear.  Neck: Supple, no elevated JVP or carotid bruits, no thyromegaly.  Lungs: Diminished but clear to auscultation, nonlabored breathing at rest.  Cardiac: Regular rate and rhythm, no S3, soft apical systolic murmur.  Abdomen: Soft, nontender, protuberant, bowel sounds present.  Extremities: No pitting edema, distal pulses 2+.    Problem List and Plan   Paroxysmal atrial fibrillation Continues on low-dose amiodarone, Toprol-XL and Coumadin.  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  Encounter for therapeutic drug monitoring Check TSH and LFTs, remains on low-dose amiodarone.    Jonelle Sidle, M.D., F.A.C.C.

## 2014-04-18 NOTE — Assessment & Plan Note (Signed)
Continues on low-dose amiodarone, Toprol-XL and Coumadin.

## 2014-04-23 ENCOUNTER — Ambulatory Visit (INDEPENDENT_AMBULATORY_CARE_PROVIDER_SITE_OTHER): Payer: Private Health Insurance - Indemnity | Admitting: *Deleted

## 2014-04-23 DIAGNOSIS — I4891 Unspecified atrial fibrillation: Secondary | ICD-10-CM

## 2014-04-23 DIAGNOSIS — I480 Paroxysmal atrial fibrillation: Secondary | ICD-10-CM

## 2014-04-23 DIAGNOSIS — Z5181 Encounter for therapeutic drug level monitoring: Secondary | ICD-10-CM

## 2014-04-23 DIAGNOSIS — Z7901 Long term (current) use of anticoagulants: Secondary | ICD-10-CM

## 2014-04-23 LAB — POCT INR: INR: 2.2

## 2014-05-01 ENCOUNTER — Other Ambulatory Visit: Payer: Self-pay | Admitting: Cardiology

## 2014-05-08 ENCOUNTER — Telehealth: Payer: Self-pay | Admitting: *Deleted

## 2014-05-08 NOTE — Telephone Encounter (Signed)
Message copied by Eustace Moore on Wed May 08, 2014 12:04 PM ------      Message from: Jonelle Sidle      Created: Tue May 07, 2014 11:13 AM       Reviewed. Thyroid and liver function studies are normal on amiodarone. ------

## 2014-05-08 NOTE — Telephone Encounter (Signed)
Patient informed. 

## 2014-06-04 ENCOUNTER — Ambulatory Visit (INDEPENDENT_AMBULATORY_CARE_PROVIDER_SITE_OTHER): Payer: Private Health Insurance - Indemnity | Admitting: *Deleted

## 2014-06-04 DIAGNOSIS — I480 Paroxysmal atrial fibrillation: Secondary | ICD-10-CM

## 2014-06-04 DIAGNOSIS — Z5181 Encounter for therapeutic drug level monitoring: Secondary | ICD-10-CM

## 2014-06-04 DIAGNOSIS — Z7901 Long term (current) use of anticoagulants: Secondary | ICD-10-CM

## 2014-06-04 DIAGNOSIS — I4891 Unspecified atrial fibrillation: Secondary | ICD-10-CM

## 2014-06-04 LAB — POCT INR: INR: 2.1

## 2014-06-05 ENCOUNTER — Other Ambulatory Visit: Payer: Self-pay | Admitting: Cardiology

## 2014-07-08 ENCOUNTER — Other Ambulatory Visit: Payer: Self-pay | Admitting: *Deleted

## 2014-07-08 MED ORDER — WARFARIN SODIUM 2.5 MG PO TABS: 2.5000 mg | ORAL_TABLET | ORAL | Status: DC

## 2014-07-16 ENCOUNTER — Ambulatory Visit (INDEPENDENT_AMBULATORY_CARE_PROVIDER_SITE_OTHER): Payer: Private Health Insurance - Indemnity | Admitting: *Deleted

## 2014-07-16 ENCOUNTER — Encounter: Payer: Self-pay | Admitting: *Deleted

## 2014-07-16 VITALS — BP 107/69 | HR 75 | Wt 211.0 lb

## 2014-07-16 DIAGNOSIS — Z5181 Encounter for therapeutic drug level monitoring: Secondary | ICD-10-CM

## 2014-07-16 DIAGNOSIS — Z7901 Long term (current) use of anticoagulants: Secondary | ICD-10-CM

## 2014-07-16 DIAGNOSIS — I480 Paroxysmal atrial fibrillation: Secondary | ICD-10-CM

## 2014-07-16 DIAGNOSIS — I4891 Unspecified atrial fibrillation: Secondary | ICD-10-CM

## 2014-07-16 LAB — POCT INR: INR: 2

## 2014-07-16 NOTE — Progress Notes (Unsigned)
While patient seen today for INR visit. Patient request to have blood pressure taken. Patient said a few days ago, he got up and felt lightheaded. Patient said it happened a couple times and was concerned that his blood pressure was too low. No c/o of chest pain, sob. Patient c/o pain on his left side. Patient advised to see his PCP for his left side pain. Patient didn't want to make an earlier appointment. Patient is scheduled to see [facility] on 10/01/14. Patient advised to call our office if his symptoms didn't improve to make an earlier appointment or if his symptoms got worse, to go to the ED for an evaluation. 

## 2014-07-16 NOTE — Progress Notes (Signed)
While patient seen today for INR visit. Patient request to have blood pressure taken. Patient said a few days ago, he got up and felt lightheaded. Patient said it happened a couple times and was concerned that his blood pressure was too low. No c/o of chest pain, sob. Patient c/o pain on his left side. Patient advised to see his PCP for his left side pain. Patient didn't want to make an earlier appointment. Patient is scheduled to see Diona Browner on 10/01/14. Patient advised to call our office if his symptoms didn't improve to make an earlier appointment or if his symptoms got worse, to go to the ED for an evaluation.

## 2014-08-27 ENCOUNTER — Ambulatory Visit (INDEPENDENT_AMBULATORY_CARE_PROVIDER_SITE_OTHER): Payer: Private Health Insurance - Indemnity | Admitting: *Deleted

## 2014-08-27 DIAGNOSIS — Z7901 Long term (current) use of anticoagulants: Secondary | ICD-10-CM

## 2014-08-27 DIAGNOSIS — I4891 Unspecified atrial fibrillation: Secondary | ICD-10-CM

## 2014-08-27 DIAGNOSIS — Z5181 Encounter for therapeutic drug level monitoring: Secondary | ICD-10-CM

## 2014-08-27 DIAGNOSIS — I480 Paroxysmal atrial fibrillation: Secondary | ICD-10-CM

## 2014-08-27 LAB — POCT INR: INR: 2.2

## 2014-09-25 ENCOUNTER — Encounter: Payer: Private Health Insurance - Indemnity | Admitting: Internal Medicine

## 2014-10-01 ENCOUNTER — Ambulatory Visit (INDEPENDENT_AMBULATORY_CARE_PROVIDER_SITE_OTHER): Payer: Private Health Insurance - Indemnity | Admitting: *Deleted

## 2014-10-01 ENCOUNTER — Ambulatory Visit (INDEPENDENT_AMBULATORY_CARE_PROVIDER_SITE_OTHER): Payer: Private Health Insurance - Indemnity | Admitting: Cardiology

## 2014-10-01 ENCOUNTER — Encounter: Payer: Self-pay | Admitting: Cardiology

## 2014-10-01 VITALS — BP 114/74 | HR 74 | Ht 70.0 in | Wt 203.0 lb

## 2014-10-01 DIAGNOSIS — Z7901 Long term (current) use of anticoagulants: Secondary | ICD-10-CM

## 2014-10-01 DIAGNOSIS — I429 Cardiomyopathy, unspecified: Secondary | ICD-10-CM

## 2014-10-01 DIAGNOSIS — I480 Paroxysmal atrial fibrillation: Secondary | ICD-10-CM

## 2014-10-01 DIAGNOSIS — Z5181 Encounter for therapeutic drug level monitoring: Secondary | ICD-10-CM

## 2014-10-01 LAB — POCT INR: INR: 2.1

## 2014-10-01 NOTE — Assessment & Plan Note (Signed)
No active heart failure symptoms with chronic dyspnea on exertion. Weight is down in the setting of diarrheal illness. Continue current medical regimen. We will consider follow-up echocardiogram around the time of his next visit.

## 2014-10-01 NOTE — Patient Instructions (Signed)

## 2014-10-01 NOTE — Progress Notes (Signed)
   Reason for visit: Atrial fibrillation, cardiomyopathy  Clinical Summary [name] is a 73 y.o.male last seen in May. He reports no palpitations and states that he has been tolerating amiodarone and Coumadin. No bleeding episodes, although he does bruise easily. Recent INR 2.2. Lab work from June showed normal LFTs and TSH.  He has had recent problems with a diarrheal illness, lost almost 10 pounds. He is working with Dr. Margo Thomas on resolving this. Also having trouble with seasonal allergies.  Echocardiogram in May 2014 demonstrated LVEF 35-40% with diffuse hypokinesis, grade 1 diastolic dysfunction, mild left atrial enlargement, PASP 31 mm mercury.he reports chronic NYHA class 2-3 dyspnea, no exertional chest pain.  Allergies  Allergen Reactions  . Bee Venom     BEE STINGS  . Codeine     REACTION: muscle cramps  . Corn-Containing Products     Corn bread     Current Outpatient Prescriptions  Medication Sig Dispense Refill  . acetaminophen (TYLENOL) 325 MG tablet Take 650 mg by mouth as needed.    Marland Kitchen amiodarone (PACERONE) 200 MG tablet TAKE (1/2) TABLET BY MOUTH DAILY. 30 tablet 3  . Homeopathic Products (ALLERGY MEDICINE PO) Take by mouth as needed.    . metoprolol succinate (TOPROL-XL) 25 MG 24 hr tablet TAKE (1) TABLET TWICE DAILY. 60 tablet 6  . warfarin (COUMADIN) 2.5 MG tablet Take 1 tablet daily except 1 1/2 tablets on Tuesdays and Fridays 45 tablet 3   No current facility-administered medications for this visit.    Past Medical History  Diagnosis Date  . Asthma   . Tachy-brady syndrome     PPM (SJM) implanted by Dr [name] 10/19/99, most recent gen change 04/03/08  . Paroxysmal atrial fibrillation   . Bronchitis   . Peptic ulcer disease   . History of recurrent UTIs   . History of ruptured appendix   . History of epididymitis   . 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 His  smoking use included Cigarettes. He has a 30 pack-year smoking history. He has never used smokeless tobacco. [name] reports that he does not drink alcohol.  Review of Systems Complete review of systems negative except as otherwise outlined in the clinical summary.  Physical Examination Filed Vitals:   10/01/14 1000  BP: 114/74  Pulse: 74   Filed Weights   10/01/14 1000  Weight: 203 lb (92.08 kg)    Patient appears comfortable at rest. HEENT: Conjunctiva and lids normal, oropharynx clear.  Neck: Supple, no elevated JVP or carotid bruits, no thyromegaly.  Lungs: Diminished but clear to auscultation, nonlabored breathing at rest.  Cardiac: Regular rate and rhythm, no S3, soft apical systolic murmur.  Abdomen: Soft, nontender, protuberant, bowel sounds present.  Extremities: No pitting edema, distal pulses 2+.    Problem List and Plan   Paroxysmal atrial fibrillation He is in sinus rhythm on examination today, plan to continue on amiodarone, Toprol-XL, and Coumadin. LFTs and TSH were normal as of this summer. Follow-up arranged in 6 months.  Cardiomyopathy No active heart failure symptoms with chronic dyspnea on exertion. Weight is down in the setting of diarrheal illness. Continue current medical regimen. We will consider follow-up echocardiogram around the time of his next visit.    Jonelle Sidle, M.D., F.A.C.C.

## 2014-10-01 NOTE — Assessment & Plan Note (Signed)
He is in sinus rhythm on examination today, plan to continue on amiodarone, Toprol-XL, and Coumadin. LFTs and TSH were normal as of this summer. Follow-up arranged in 6 months.

## 2014-10-21 ENCOUNTER — Other Ambulatory Visit: Payer: Self-pay | Admitting: Cardiology

## 2014-11-01 ENCOUNTER — Other Ambulatory Visit: Payer: Self-pay | Admitting: Cardiology

## 2014-11-08 ENCOUNTER — Telehealth: Payer: Self-pay | Admitting: Cardiology

## 2014-11-08 NOTE — Telephone Encounter (Signed)
Called and instructed patient to have request faxed to our office. Patient requested our office contact [facility] office. Request made.

## 2014-11-08 NOTE — Telephone Encounter (Signed)
[name] is scheduled for a colonoscopy on 11/13/2014 by Dr. Teena Dunk. He was told to come off coumdin on 12-10 .  Please advise to patient when he is suppose to go Back on the coumdin. He has appointment on Tuesday, 11-12-14. Do we need to cancel this appointment?

## 2014-11-19 ENCOUNTER — Ambulatory Visit (INDEPENDENT_AMBULATORY_CARE_PROVIDER_SITE_OTHER): Payer: Private Health Insurance - Indemnity | Admitting: *Deleted

## 2014-11-19 DIAGNOSIS — Z5181 Encounter for therapeutic drug level monitoring: Secondary | ICD-10-CM

## 2014-11-19 DIAGNOSIS — Z7901 Long term (current) use of anticoagulants: Secondary | ICD-10-CM

## 2014-11-19 DIAGNOSIS — I480 Paroxysmal atrial fibrillation: Secondary | ICD-10-CM

## 2014-11-19 LAB — POCT INR: INR: 1.1

## 2014-11-28 ENCOUNTER — Other Ambulatory Visit: Payer: Self-pay | Admitting: Cardiology

## 2014-12-02 ENCOUNTER — Encounter: Payer: Self-pay | Admitting: Internal Medicine

## 2014-12-02 ENCOUNTER — Ambulatory Visit (INDEPENDENT_AMBULATORY_CARE_PROVIDER_SITE_OTHER): Payer: Private Health Insurance - Indemnity | Admitting: Internal Medicine

## 2014-12-02 VITALS — BP 136/82 | HR 77 | Ht 70.0 in | Wt 199.0 lb

## 2014-12-02 DIAGNOSIS — Z950 Presence of cardiac pacemaker: Secondary | ICD-10-CM | POA: Diagnosis not present

## 2014-12-02 DIAGNOSIS — F329 Major depressive disorder, single episode, unspecified: Secondary | ICD-10-CM | POA: Diagnosis not present

## 2014-12-02 DIAGNOSIS — Z7901 Long term (current) use of anticoagulants: Secondary | ICD-10-CM

## 2014-12-02 DIAGNOSIS — I480 Paroxysmal atrial fibrillation: Secondary | ICD-10-CM

## 2014-12-02 DIAGNOSIS — F32A Depression, unspecified: Secondary | ICD-10-CM | POA: Insufficient documentation

## 2014-12-02 LAB — MDC_IDC_ENUM_SESS_TYPE_INCLINIC
Battery Impedance: 2300 Ohm
Battery Voltage: 2.78 V
Brady Statistic RA Percent Paced: 96 %
Date Time Interrogation Session: 20160104113529
Lead Channel Pacing Threshold Amplitude: 0.75 V
Lead Channel Pacing Threshold Amplitude: 1.25 V
Lead Channel Pacing Threshold Pulse Width: 0.4 ms
Lead Channel Pacing Threshold Pulse Width: 0.4 ms
Lead Channel Sensing Intrinsic Amplitude: 5 mV
MDC IDC MSMT LEADCHNL RA IMPEDANCE VALUE: 444 Ohm
MDC IDC MSMT LEADCHNL RV IMPEDANCE VALUE: 337 Ohm
MDC IDC MSMT LEADCHNL RV PACING THRESHOLD AMPLITUDE: 1.25 V
MDC IDC MSMT LEADCHNL RV PACING THRESHOLD PULSEWIDTH: 0.4 ms
MDC IDC MSMT LEADCHNL RV SENSING INTR AMPL: 9.5 mV
MDC IDC PG SERIAL: 2109692
MDC IDC SET LEADCHNL RV PACING AMPLITUDE: 2.5 V
MDC IDC SET LEADCHNL RV PACING PULSEWIDTH: 0.4 ms
MDC IDC SET LEADCHNL RV SENSING SENSITIVITY: 2 mV
MDC IDC STAT BRADY RV PERCENT PACED: 1 %

## 2014-12-02 NOTE — Assessment & Plan Note (Signed)
He is maintaining SR 98% of the time. No change in his low dose amiodarone.

## 2014-12-02 NOTE — Progress Notes (Signed)
HPI [name] returns today for followup. He is a pleasant 74 yo man with a h/o sinus node dysfunction, s/p PPM over 15 years ago. He had been followed by Dr. Graciela Husbands in [place] but wanted to be seen closer to home. He returns today for followup. He has been bothered by malaise and depression, especially after the loss of a good friend from a MVA. He denies chest pain. He is sedentary. "Exercise makes me tired".  Allergies  Allergen Reactions  . Bee Venom     BEE STINGS  . Codeine     REACTION: muscle cramps  . Corn-Containing Products     Corn bread      Current Outpatient Prescriptions  Medication Sig Dispense Refill  . acetaminophen (TYLENOL) 325 MG tablet Take 650 mg by mouth as needed.    Marland Kitchen amiodarone (PACERONE) 200 MG tablet TAKE (1/2) TABLET BY MOUTH DAILY. 45 tablet 3  . Homeopathic Products (ALLERGY MEDICINE PO) Take by mouth as needed.    . metoprolol succinate (TOPROL-XL) 25 MG 24 hr tablet TAKE (1) TABLET TWICE DAILY. 60 tablet 6  . warfarin (COUMADIN) 2.5 MG tablet Take 1 tablet daily except 1 1/2 tablets on Tuesdays and Fridays 45 tablet 3  . warfarin (COUMADIN) 2.5 MG tablet TAKE 1 TABLET BY MOUTH AS DIRECTED. 30 tablet 3   No current facility-administered medications for this visit.     Past Medical History  Diagnosis Date  . Asthma   . Tachy-brady syndrome     PPM (SJM) implanted by Dr Graciela Husbands 10/19/99, most recent gen change 04/03/08  . Paroxysmal atrial fibrillation   . Bronchitis   . Peptic ulcer disease   . History of recurrent UTIs   . History of ruptured appendix   . History of epididymitis   . 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   Past Surgical History  Procedure Laterality Date  . Pacemaker placement  03/2008    St. Jude, [place]. [id number]     No family history on file.   History   Social History  . Marital Status: Divorced    Spouse Name: [name]      Number of Children: [name]  . Years of Education: [name]   Occupational History  . Not on file.   Social History Main Topics  . Smoking status: Former Smoker -- 1.50 packs/day for 20 years    Types: Cigarettes    Quit date: 11/30/1977  . Smokeless tobacco: Never Used  . Alcohol Use: No  . Drug Use: No  . Sexual Activity: Not on file   Other Topics Concern  . Not on file   Social History Narrative     BP 136/82 mmHg  Pulse 77  Ht 5\' 10"  (1.778 m)  Wt 199 lb (90.266 kg)  BMI 28.55 kg/m2  SpO2 98%  Physical Exam:  Well appearing 74 yo man, NAD HEENT: Unremarkable Neck:  6 cm JVD, no thyromegally Lymphatics:  No adenopathy Back:  No CVA tenderness Lungs:  Clear with no wheezes HEART:  Regular rate rhythm, no murmurs, no rubs, no clicks Abd:  soft, positive bowel sounds, no organomegally, no rebound, no guarding Ext:  2 plus pulses, no edema, no cyanosis, no clubbing Skin:  No rashes no nodules Neuro:  CN II through XII intact, motor grossly intact  EKG - nsr with atrial pacing  DEVICE  Normal device function.  See PaceArt for details.   Assess/Plan:

## 2014-12-02 NOTE — Assessment & Plan Note (Signed)
No suicidal ideations. I have encouraged the patient to increase his physical activity and followup with his primary MD if symptoms do not improve.

## 2014-12-02 NOTE — Assessment & Plan Note (Signed)
He will continue warfarin with no change in dose.

## 2014-12-02 NOTE — Assessment & Plan Note (Signed)
His St. Jude DDD PM is working normally. Will recheck in several months. 

## 2014-12-02 NOTE — Patient Instructions (Signed)
Your physician wants you to follow-up in: 1 year with Dr. Ladona Ridgel and 6 months with Alisia Ferrari will receive a reminder letter in the mail two months in advance. If you don't receive a letter, please call our office to schedule the follow-up appointment.  Your physician recommends that you continue on your current medications as directed. Please refer to the Current Medication list given to you today.  Thank you for choosing [facility]!!

## 2014-12-03 ENCOUNTER — Encounter: Payer: Self-pay | Admitting: Internal Medicine

## 2014-12-03 ENCOUNTER — Ambulatory Visit (INDEPENDENT_AMBULATORY_CARE_PROVIDER_SITE_OTHER): Payer: Private Health Insurance - Indemnity | Admitting: *Deleted

## 2014-12-03 DIAGNOSIS — I480 Paroxysmal atrial fibrillation: Secondary | ICD-10-CM

## 2014-12-03 DIAGNOSIS — Z5181 Encounter for therapeutic drug level monitoring: Secondary | ICD-10-CM

## 2014-12-03 DIAGNOSIS — Z7901 Long term (current) use of anticoagulants: Secondary | ICD-10-CM

## 2014-12-03 LAB — POCT INR: INR: 1.8

## 2014-12-03 LAB — PACEMAKER DEVICE OBSERVATION

## 2014-12-24 ENCOUNTER — Ambulatory Visit (INDEPENDENT_AMBULATORY_CARE_PROVIDER_SITE_OTHER): Payer: Private Health Insurance - Indemnity | Admitting: *Deleted

## 2014-12-24 DIAGNOSIS — I480 Paroxysmal atrial fibrillation: Secondary | ICD-10-CM

## 2014-12-24 DIAGNOSIS — Z7901 Long term (current) use of anticoagulants: Secondary | ICD-10-CM

## 2014-12-24 DIAGNOSIS — Z5181 Encounter for therapeutic drug level monitoring: Secondary | ICD-10-CM

## 2014-12-24 LAB — POCT INR: INR: 2

## 2015-01-14 ENCOUNTER — Ambulatory Visit (INDEPENDENT_AMBULATORY_CARE_PROVIDER_SITE_OTHER): Payer: Private Health Insurance - Indemnity | Admitting: *Deleted

## 2015-01-14 DIAGNOSIS — I480 Paroxysmal atrial fibrillation: Secondary | ICD-10-CM

## 2015-01-14 DIAGNOSIS — Z7901 Long term (current) use of anticoagulants: Secondary | ICD-10-CM

## 2015-01-14 DIAGNOSIS — Z5181 Encounter for therapeutic drug level monitoring: Secondary | ICD-10-CM

## 2015-01-14 LAB — POCT INR: INR: 2.2

## 2015-02-03 ENCOUNTER — Other Ambulatory Visit: Payer: Self-pay | Admitting: Cardiology

## 2015-02-11 ENCOUNTER — Ambulatory Visit (INDEPENDENT_AMBULATORY_CARE_PROVIDER_SITE_OTHER): Payer: Medicare HMO | Admitting: *Deleted

## 2015-02-11 DIAGNOSIS — Z7901 Long term (current) use of anticoagulants: Secondary | ICD-10-CM

## 2015-02-11 DIAGNOSIS — Z5181 Encounter for therapeutic drug level monitoring: Secondary | ICD-10-CM

## 2015-02-11 DIAGNOSIS — I480 Paroxysmal atrial fibrillation: Secondary | ICD-10-CM

## 2015-02-11 LAB — POCT INR: INR: 1.5

## 2015-02-25 ENCOUNTER — Ambulatory Visit (INDEPENDENT_AMBULATORY_CARE_PROVIDER_SITE_OTHER): Payer: Medicare HMO | Admitting: *Deleted

## 2015-02-25 DIAGNOSIS — I480 Paroxysmal atrial fibrillation: Secondary | ICD-10-CM

## 2015-02-25 DIAGNOSIS — Z7901 Long term (current) use of anticoagulants: Secondary | ICD-10-CM

## 2015-02-25 DIAGNOSIS — Z5181 Encounter for therapeutic drug level monitoring: Secondary | ICD-10-CM | POA: Diagnosis not present

## 2015-02-25 LAB — POCT INR: INR: 1.9

## 2015-03-18 ENCOUNTER — Ambulatory Visit (INDEPENDENT_AMBULATORY_CARE_PROVIDER_SITE_OTHER): Payer: Medicare HMO | Admitting: *Deleted

## 2015-03-18 DIAGNOSIS — Z5181 Encounter for therapeutic drug level monitoring: Secondary | ICD-10-CM | POA: Diagnosis not present

## 2015-03-18 DIAGNOSIS — Z7901 Long term (current) use of anticoagulants: Secondary | ICD-10-CM

## 2015-03-18 DIAGNOSIS — I480 Paroxysmal atrial fibrillation: Secondary | ICD-10-CM | POA: Diagnosis not present

## 2015-03-18 LAB — POCT INR: INR: 1.9

## 2015-04-08 ENCOUNTER — Ambulatory Visit (INDEPENDENT_AMBULATORY_CARE_PROVIDER_SITE_OTHER): Payer: Medicare HMO | Admitting: *Deleted

## 2015-04-08 DIAGNOSIS — I480 Paroxysmal atrial fibrillation: Secondary | ICD-10-CM | POA: Diagnosis not present

## 2015-04-08 DIAGNOSIS — Z7901 Long term (current) use of anticoagulants: Secondary | ICD-10-CM

## 2015-04-08 DIAGNOSIS — Z5181 Encounter for therapeutic drug level monitoring: Secondary | ICD-10-CM | POA: Diagnosis not present

## 2015-04-08 LAB — POCT INR: INR: 2.2

## 2015-05-06 ENCOUNTER — Ambulatory Visit (INDEPENDENT_AMBULATORY_CARE_PROVIDER_SITE_OTHER): Payer: Medicare HMO | Admitting: *Deleted

## 2015-05-06 DIAGNOSIS — Z7901 Long term (current) use of anticoagulants: Secondary | ICD-10-CM

## 2015-05-06 DIAGNOSIS — I480 Paroxysmal atrial fibrillation: Secondary | ICD-10-CM

## 2015-05-06 DIAGNOSIS — Z5181 Encounter for therapeutic drug level monitoring: Secondary | ICD-10-CM

## 2015-05-06 LAB — POCT INR: INR: 2.6

## 2015-05-07 ENCOUNTER — Other Ambulatory Visit: Payer: Self-pay | Admitting: *Deleted

## 2015-05-07 MED ORDER — WARFARIN SODIUM 2.5 MG PO TABS: ORAL_TABLET | ORAL | Status: DC

## 2015-05-28 ENCOUNTER — Other Ambulatory Visit: Payer: Self-pay | Admitting: Cardiology

## 2015-06-03 ENCOUNTER — Ambulatory Visit (INDEPENDENT_AMBULATORY_CARE_PROVIDER_SITE_OTHER): Payer: Medicare HMO | Admitting: *Deleted

## 2015-06-03 DIAGNOSIS — I480 Paroxysmal atrial fibrillation: Secondary | ICD-10-CM

## 2015-06-03 DIAGNOSIS — Z5181 Encounter for therapeutic drug level monitoring: Secondary | ICD-10-CM | POA: Diagnosis not present

## 2015-06-03 DIAGNOSIS — Z7901 Long term (current) use of anticoagulants: Secondary | ICD-10-CM

## 2015-06-03 LAB — POCT INR: INR: 3

## 2015-06-20 ENCOUNTER — Ambulatory Visit (INDEPENDENT_AMBULATORY_CARE_PROVIDER_SITE_OTHER): Payer: Medicare HMO | Admitting: *Deleted

## 2015-06-20 DIAGNOSIS — I429 Cardiomyopathy, unspecified: Secondary | ICD-10-CM | POA: Diagnosis not present

## 2015-06-20 LAB — CUP PACEART INCLINIC DEVICE CHECK
Battery Voltage: 2.8 V
Brady Statistic RA Percent Paced: 98 %
Brady Statistic RV Percent Paced: 1 %
Date Time Interrogation Session: 20160722110120
Lead Channel Impedance Value: 317 Ohm
Lead Channel Pacing Threshold Amplitude: 0.75 V
Lead Channel Pacing Threshold Amplitude: 1.25 V
Lead Channel Pacing Threshold Pulse Width: 0.4 ms
Lead Channel Pacing Threshold Pulse Width: 0.4 ms
Lead Channel Sensing Intrinsic Amplitude: 5 mV
Lead Channel Sensing Intrinsic Amplitude: 8.1 mV
Lead Channel Setting Pacing Amplitude: 2.5 V
MDC IDC MSMT BATTERY IMPEDANCE: 3200 Ohm
MDC IDC MSMT LEADCHNL RA IMPEDANCE VALUE: 435 Ohm
MDC IDC PG SERIAL: 2109692
MDC IDC SET LEADCHNL RV PACING PULSEWIDTH: 0.4 ms
MDC IDC SET LEADCHNL RV SENSING SENSITIVITY: 2 mV

## 2015-06-20 NOTE — Progress Notes (Signed)
Normal pacer check in clinic Estimated battery longevity 0.50 to 1.25 years--monthly checks (next billable October) AF 4.5% + Warfarin. Longest episode was 18 hours 3 minutes. No changes made. ROV 07-18-15 @ 1100 for device check--battery check only

## 2015-07-07 ENCOUNTER — Other Ambulatory Visit: Payer: Self-pay | Admitting: *Deleted

## 2015-07-07 MED ORDER — WARFARIN SODIUM 2.5 MG PO TABS: ORAL_TABLET | ORAL | Status: DC

## 2015-07-08 ENCOUNTER — Encounter: Payer: Self-pay | Admitting: Internal Medicine

## 2015-07-15 ENCOUNTER — Ambulatory Visit (INDEPENDENT_AMBULATORY_CARE_PROVIDER_SITE_OTHER): Payer: Medicare HMO | Admitting: *Deleted

## 2015-07-15 DIAGNOSIS — I480 Paroxysmal atrial fibrillation: Secondary | ICD-10-CM | POA: Diagnosis not present

## 2015-07-15 DIAGNOSIS — Z7901 Long term (current) use of anticoagulants: Secondary | ICD-10-CM

## 2015-07-15 DIAGNOSIS — Z5181 Encounter for therapeutic drug level monitoring: Secondary | ICD-10-CM

## 2015-07-15 LAB — POCT INR: INR: 3

## 2015-07-18 ENCOUNTER — Ambulatory Visit (INDEPENDENT_AMBULATORY_CARE_PROVIDER_SITE_OTHER): Payer: Medicare HMO | Admitting: *Deleted

## 2015-07-18 DIAGNOSIS — Z950 Presence of cardiac pacemaker: Secondary | ICD-10-CM

## 2015-07-18 LAB — CUP PACEART INCLINIC DEVICE CHECK
Battery Impedance: 3500 Ohm
Battery Voltage: 2.78 V
Brady Statistic RA Percent Paced: 99 %
Lead Channel Impedance Value: 426 Ohm
Lead Channel Setting Pacing Amplitude: 2.5 V
Lead Channel Setting Pacing Pulse Width: 0.4 ms
Lead Channel Setting Sensing Sensitivity: 2 mV
MDC IDC MSMT LEADCHNL RV IMPEDANCE VALUE: 314 Ohm
MDC IDC SESS DTM: 20160819112823
MDC IDC STAT BRADY RV PERCENT PACED: 1 % — AB
Pulse Gen Model: 5826
Pulse Gen Serial Number: 2109692

## 2015-07-18 NOTE — Progress Notes (Signed)
Pacer battery check only in clinic. Battery longevity: 0.5-1 year remaining. ROV with device clinic 08/22/15, and with GT in Jan.

## 2015-07-29 ENCOUNTER — Encounter: Payer: Self-pay | Admitting: *Deleted

## 2015-07-30 ENCOUNTER — Encounter: Payer: Self-pay | Admitting: *Deleted

## 2015-07-30 ENCOUNTER — Ambulatory Visit (INDEPENDENT_AMBULATORY_CARE_PROVIDER_SITE_OTHER): Payer: Medicare HMO | Admitting: Cardiology

## 2015-07-30 ENCOUNTER — Encounter: Payer: Self-pay | Admitting: Cardiology

## 2015-07-30 VITALS — BP 118/88 | HR 76 | Ht 72.0 in | Wt 194.0 lb

## 2015-07-30 DIAGNOSIS — I480 Paroxysmal atrial fibrillation: Secondary | ICD-10-CM

## 2015-07-30 DIAGNOSIS — Z79899 Other long term (current) drug therapy: Secondary | ICD-10-CM

## 2015-07-30 DIAGNOSIS — Z5181 Encounter for therapeutic drug level monitoring: Secondary | ICD-10-CM | POA: Diagnosis not present

## 2015-07-30 DIAGNOSIS — R0602 Shortness of breath: Secondary | ICD-10-CM | POA: Diagnosis not present

## 2015-07-30 DIAGNOSIS — I429 Cardiomyopathy, unspecified: Secondary | ICD-10-CM

## 2015-07-30 DIAGNOSIS — R0989 Other specified symptoms and signs involving the circulatory and respiratory systems: Secondary | ICD-10-CM

## 2015-07-30 DIAGNOSIS — Z950 Presence of cardiac pacemaker: Secondary | ICD-10-CM

## 2015-07-30 NOTE — Progress Notes (Signed)
Cardiology Office Note  Date: 07/30/2015   ID: TREAVER JACQUOT, DOB [birth year], MRN [id number]  PCP: Louie Boston, MD  Primary Cardiologist: [name], MD   Chief Complaint  Patient presents with  . PAF  . Cardiomyopathy    History of Present Illness: [name] is a 74 y.o. male last seen in November 2015. Interval follow-up with Dr. Ladona Ridgel noted. Device interrogation shows relatively low atrial fibrillation burden, estimated battery life is 0.5-1 year, and he continues to follow closely in the device clinic.  He comes in today for a routine visit. Reports no palpitations or chest pain. Stable NYHA class II dyspnea. He has noted some chest congestion, more so in the last several months. He was concerned about his amiodarone which is already at low dose, 100 mg daily.  He continues to follow with Dr. Margo Common, reports lab work earlier in the year.  Maintains Coumadin follow-up in the anticoagulation clinic. Recent INR was 3.0. He denies any bleeding problems.   Past Medical History  Diagnosis Date  . Asthma   . Tachy-brady syndrome     PPM (SJM) implanted by Dr Graciela Husbands 10/19/99, gen change 04/03/08  . Paroxysmal atrial fibrillation   . Bronchitis   . Peptic ulcer disease   . History of recurrent UTIs   . History of ruptured appendix   . History of epididymitis   . 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 Pacemaker placement  03/2008    St. Jude, [place]. 2458    Current Outpatient Prescriptions  Medication Sig Dispense Refill  . acetaminophen (TYLENOL) 325 MG tablet Take 650 mg by mouth as needed.    Marland Kitchen amiodarone (PACERONE) 200 MG tablet TAKE (1/2) TABLET BY MOUTH DAILY. 45 tablet 3  . Homeopathic Products (ALLERGY MEDICINE PO) Take by mouth as needed.    . metoprolol succinate (TOPROL-XL) 25 MG 24 hr tablet TAKE (1) TABLET TWICE DAILY. 60 tablet 6  . sertraline (ZOLOFT) 50 MG tablet Take 50 mg  by mouth daily.    Marland Kitchen warfarin (COUMADIN) 2.5 MG tablet Take 2.5 mg by mouth daily. 1 1/2 tab daily     No current facility-administered medications for this visit.    Allergies:  Bee venom; Codeine; and Corn-containing products   Social History: The patient  reports that he quit smoking about 37 years ago. His smoking use included Cigarettes. He has a 30 pack-year smoking history. He has never used smokeless tobacco. He reports that he does not drink alcohol or use illicit drugs.   ROS:  Please see the history of present illness. Otherwise, complete review of systems is positive for arthritic pains.  All other systems are reviewed and negative.   Physical Exam: VS:  BP 118/88 mmHg  Pulse 76  Ht 6' (1.829 m)  Wt 194 lb (87.998 kg)  BMI 26.31 kg/m2  SpO2 97%, BMI Body mass index is 26.31 kg/(m^2).  Wt Readings from Last 3 Encounters:  07/30/15 194 lb (87.998 kg)  12/02/14 199 lb (90.266 kg)  10/01/14 203 lb (92.08 kg)     Patient appears comfortable at rest. HEENT: Conjunctiva and lids normal, oropharynx clear.  Neck: Supple, no elevated JVP or carotid bruits, no thyromegaly.  Lungs: Diminished but clear to auscultation, nonlabored breathing at rest.  Cardiac: Regular rate and rhythm, no S3, soft apical systolic murmur.  Abdomen: Soft, nontender, protuberant, bowel sounds present.  Extremities: No  pitting edema, distal pulses 2+.  Skin: Warm and dry. Musculoskeletal: No kyphosis. Neuropsychiatric: Alert and oriented 3, affect appropriate.   ECG: Tracing from 12/02/2014 showed an atrial paced rhythm with left bundle-branch block.   Other Studies Reviewed Today:  Echocardiogram in May 2014 demonstrated LVEF 35-40% with diffuse hypokinesis, grade 1 diastolic dysfunction, mild left atrial enlargement, PASP 31 mm mercury.he reports chronic NYHA class 2-3 dyspnea, no exertional chest pain.  Assessment and Plan:  1. Paroxysmal atrial fibrillation. Symptomatically well  controlled with low burden by device follow-up. Requesting lab work from Dr. Margo Common to verify that he had LFTs and TSH since last year. We will also obtain a chest x-ray and PFTs. He is already on low-dose amiodarone. Next step would be discontinuing it if he is showing any signs of toxicity. Continues on Coumadin.  2. 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 He will need a follow-up echocardiogram before he has his generator changed to ensure stability in LVEF.  3. 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 Ladona Ridgel.  Current medicines were reviewed with the patient today.   Orders Placed This Encounter  Procedures  . DG Chest 2 View  . Pulmonary Function Test    Disposition: FU with me in 6 months.   Signed, Jonelle Sidle, MD, [facility] 07/30/2015 11:07 AM    [facility] at [facility] [address] [place], [place], [place] [address] Phone: [phone]; Fax: [phone]

## 2015-07-30 NOTE — Patient Instructions (Signed)
Your physician recommends that you continue on your current medications as directed. Please refer to the Current Medication list given to you today. A chest x-ray takes a picture of the organs and structures inside the chest, including the heart, lungs, and blood vessels. This test can show several things, including, whether the heart is enlarges; whether fluid is building up in the lungs; and whether pacemaker / defibrillator leads are still in place. Your physician has recommended that you have a pulmonary function test. Pulmonary Function Tests are a group of tests that measure how well air moves in and out of your lungs. Your physician recommends that you schedule a follow-up appointment in: 6 months. You will receive a reminder letter in the mail in about 4 months reminding you to call and schedule your appointment. If you don't receive this letter, please contact our office.

## 2015-08-01 ENCOUNTER — Encounter: Payer: Self-pay | Admitting: Internal Medicine

## 2015-08-06 ENCOUNTER — Other Ambulatory Visit: Payer: Self-pay | Admitting: *Deleted

## 2015-08-06 MED ORDER — WARFARIN SODIUM 2.5 MG PO TABS: 3.7500 mg | ORAL_TABLET | Freq: Every day | ORAL | Status: DC

## 2015-08-08 ENCOUNTER — Encounter (HOSPITAL_COMMUNITY): Payer: Medicare HMO

## 2015-08-22 ENCOUNTER — Ambulatory Visit (INDEPENDENT_AMBULATORY_CARE_PROVIDER_SITE_OTHER): Payer: Medicare HMO | Admitting: *Deleted

## 2015-08-22 DIAGNOSIS — Z950 Presence of cardiac pacemaker: Secondary | ICD-10-CM

## 2015-08-22 DIAGNOSIS — I495 Sick sinus syndrome: Secondary | ICD-10-CM

## 2015-08-22 DIAGNOSIS — I480 Paroxysmal atrial fibrillation: Secondary | ICD-10-CM

## 2015-08-22 LAB — CUP PACEART INCLINIC DEVICE CHECK
Battery Voltage: 2.78 V
Lead Channel Impedance Value: 434 Ohm
Lead Channel Setting Pacing Amplitude: 2.5 V
Lead Channel Setting Sensing Sensitivity: 2 mV
MDC IDC MSMT BATTERY IMPEDANCE: 4400 Ohm
MDC IDC MSMT LEADCHNL RV IMPEDANCE VALUE: 314 Ohm
MDC IDC PG SERIAL: 2109692
MDC IDC SESS DTM: 20160923113108
MDC IDC SET LEADCHNL RV PACING PULSEWIDTH: 0.4 ms
Pulse Gen Model: 5826

## 2015-08-22 NOTE — Progress Notes (Signed)
Pacer check in clinic for battery longevity only. 0.5-1 yr remaining. ROV with device clinic in [facility] 09/26/15. ROV with [facility] in January.

## 2015-08-26 ENCOUNTER — Ambulatory Visit (INDEPENDENT_AMBULATORY_CARE_PROVIDER_SITE_OTHER): Payer: Medicare HMO | Admitting: *Deleted

## 2015-08-26 DIAGNOSIS — Z7901 Long term (current) use of anticoagulants: Secondary | ICD-10-CM

## 2015-08-26 DIAGNOSIS — I480 Paroxysmal atrial fibrillation: Secondary | ICD-10-CM | POA: Diagnosis not present

## 2015-08-26 DIAGNOSIS — Z5181 Encounter for therapeutic drug level monitoring: Secondary | ICD-10-CM

## 2015-08-26 LAB — POCT INR: INR: 2.9

## 2015-08-29 ENCOUNTER — Other Ambulatory Visit: Payer: Self-pay | Admitting: Cardiology

## 2015-09-03 DIAGNOSIS — J309 Allergic rhinitis, unspecified: Secondary | ICD-10-CM | POA: Diagnosis not present

## 2015-09-03 DIAGNOSIS — J40 Bronchitis, not specified as acute or chronic: Secondary | ICD-10-CM | POA: Diagnosis not present

## 2015-09-16 DIAGNOSIS — R5383 Other fatigue: Secondary | ICD-10-CM | POA: Diagnosis not present

## 2015-09-16 DIAGNOSIS — R5381 Other malaise: Secondary | ICD-10-CM | POA: Diagnosis not present

## 2015-09-16 DIAGNOSIS — I959 Hypotension, unspecified: Secondary | ICD-10-CM | POA: Diagnosis not present

## 2015-09-19 DIAGNOSIS — R350 Frequency of micturition: Secondary | ICD-10-CM | POA: Diagnosis not present

## 2015-10-07 ENCOUNTER — Ambulatory Visit (INDEPENDENT_AMBULATORY_CARE_PROVIDER_SITE_OTHER): Payer: Medicare HMO | Admitting: *Deleted

## 2015-10-07 DIAGNOSIS — I480 Paroxysmal atrial fibrillation: Secondary | ICD-10-CM

## 2015-10-07 DIAGNOSIS — Z7901 Long term (current) use of anticoagulants: Secondary | ICD-10-CM | POA: Diagnosis not present

## 2015-10-07 DIAGNOSIS — Z5181 Encounter for therapeutic drug level monitoring: Secondary | ICD-10-CM

## 2015-10-07 LAB — POCT INR: INR: 2.9

## 2015-11-10 ENCOUNTER — Encounter: Payer: Self-pay | Admitting: Internal Medicine

## 2015-11-18 ENCOUNTER — Ambulatory Visit (INDEPENDENT_AMBULATORY_CARE_PROVIDER_SITE_OTHER): Payer: Medicare HMO | Admitting: *Deleted

## 2015-11-18 DIAGNOSIS — I480 Paroxysmal atrial fibrillation: Secondary | ICD-10-CM | POA: Diagnosis not present

## 2015-11-18 DIAGNOSIS — Z7901 Long term (current) use of anticoagulants: Secondary | ICD-10-CM | POA: Diagnosis not present

## 2015-11-18 DIAGNOSIS — Z5181 Encounter for therapeutic drug level monitoring: Secondary | ICD-10-CM

## 2015-11-18 LAB — POCT INR: INR: 2.9

## 2015-12-06 ENCOUNTER — Other Ambulatory Visit: Payer: Self-pay | Admitting: Cardiology

## 2015-12-17 DIAGNOSIS — Z23 Encounter for immunization: Secondary | ICD-10-CM | POA: Diagnosis not present

## 2015-12-17 DIAGNOSIS — Z0000 Encounter for general adult medical examination without abnormal findings: Secondary | ICD-10-CM | POA: Diagnosis not present

## 2015-12-17 DIAGNOSIS — R69 Illness, unspecified: Secondary | ICD-10-CM | POA: Diagnosis not present

## 2015-12-17 DIAGNOSIS — Z950 Presence of cardiac pacemaker: Secondary | ICD-10-CM | POA: Diagnosis not present

## 2015-12-17 DIAGNOSIS — I480 Paroxysmal atrial fibrillation: Secondary | ICD-10-CM | POA: Diagnosis not present

## 2015-12-18 ENCOUNTER — Ambulatory Visit (INDEPENDENT_AMBULATORY_CARE_PROVIDER_SITE_OTHER): Payer: Medicare HMO | Admitting: *Deleted

## 2015-12-18 ENCOUNTER — Encounter: Payer: Self-pay | Admitting: Internal Medicine

## 2015-12-18 DIAGNOSIS — I480 Paroxysmal atrial fibrillation: Secondary | ICD-10-CM | POA: Diagnosis not present

## 2015-12-18 DIAGNOSIS — I495 Sick sinus syndrome: Secondary | ICD-10-CM

## 2015-12-18 LAB — CUP PACEART INCLINIC DEVICE CHECK
Battery Voltage: 2.78 V
Brady Statistic RV Percent Paced: 1 % — CL
Date Time Interrogation Session: 20170119170157
Implantable Lead Implant Date: 20001120
Implantable Lead Location: 753859
Lead Channel Impedance Value: 433 Ohm
Lead Channel Pacing Threshold Amplitude: 1.25 V
Lead Channel Pacing Threshold Pulse Width: 0.4 ms
Lead Channel Setting Pacing Amplitude: 2.5 V
Lead Channel Setting Pacing Pulse Width: 0.4 ms
Lead Channel Setting Sensing Sensitivity: 2 mV
MDC IDC LEAD IMPLANT DT: 20001120
MDC IDC LEAD LOCATION: 753860
MDC IDC MSMT BATTERY IMPEDANCE: 4600 Ohm
MDC IDC MSMT LEADCHNL RA PACING THRESHOLD AMPLITUDE: 0.75 V
MDC IDC MSMT LEADCHNL RA PACING THRESHOLD PULSEWIDTH: 0.4 ms
MDC IDC MSMT LEADCHNL RA SENSING INTR AMPL: 5 mV
MDC IDC MSMT LEADCHNL RV IMPEDANCE VALUE: 315 Ohm
MDC IDC MSMT LEADCHNL RV SENSING INTR AMPL: 7.1 mV
MDC IDC PG SERIAL: 2109692
MDC IDC STAT BRADY RA PERCENT PACED: 97 %
Pulse Gen Model: 5826

## 2015-12-19 NOTE — Progress Notes (Signed)
Pacemaker check in clinic. Normal device function. Thresholds, sensing, and impedances consistent with previous measurements. Device programmed to maximize longevity. (122) mode switches (4.5%)---max dur. 13 hrs , Max A 591 + warfarin. No high ventricular rates noted. Device programmed at appropriate safety margins. Histogram distribution appropriate for patient activity level. Device programmed to optimize intrinsic conduction. Atrial EGM storage d/c'd to increase longevity. Estimated longevity is now 0.5-1 year remaining. Patient will follow up with [facility]/R in 3 months.

## 2015-12-25 ENCOUNTER — Other Ambulatory Visit: Payer: Self-pay | Admitting: Internal Medicine

## 2015-12-30 ENCOUNTER — Ambulatory Visit (INDEPENDENT_AMBULATORY_CARE_PROVIDER_SITE_OTHER): Payer: Medicare HMO | Admitting: *Deleted

## 2015-12-30 DIAGNOSIS — Z7901 Long term (current) use of anticoagulants: Secondary | ICD-10-CM

## 2015-12-30 DIAGNOSIS — Z5181 Encounter for therapeutic drug level monitoring: Secondary | ICD-10-CM

## 2015-12-30 DIAGNOSIS — I480 Paroxysmal atrial fibrillation: Secondary | ICD-10-CM

## 2015-12-30 LAB — POCT INR: INR: 2.4

## 2016-01-22 ENCOUNTER — Ambulatory Visit (INDEPENDENT_AMBULATORY_CARE_PROVIDER_SITE_OTHER): Payer: Medicare HMO | Admitting: Cardiology

## 2016-01-22 ENCOUNTER — Encounter: Payer: Self-pay | Admitting: *Deleted

## 2016-01-22 ENCOUNTER — Encounter: Payer: Self-pay | Admitting: Cardiology

## 2016-01-22 VITALS — BP 124/78 | HR 75 | Ht 72.0 in | Wt 207.8 lb

## 2016-01-22 DIAGNOSIS — I495 Sick sinus syndrome: Secondary | ICD-10-CM

## 2016-01-22 DIAGNOSIS — R5383 Other fatigue: Secondary | ICD-10-CM | POA: Diagnosis not present

## 2016-01-22 DIAGNOSIS — I480 Paroxysmal atrial fibrillation: Secondary | ICD-10-CM | POA: Diagnosis not present

## 2016-01-22 DIAGNOSIS — I429 Cardiomyopathy, unspecified: Secondary | ICD-10-CM

## 2016-01-22 NOTE — Patient Instructions (Signed)
Your physician recommends that you continue on your current medications as directed. Please refer to the Current Medication list given to you today. Your physician recommends that you schedule a follow-up appointment in: 6 months. You will receive a reminder letter in the mail in about 4 months reminding you to call and schedule your appointment. If you don't receive this letter, please contact our office. 

## 2016-01-22 NOTE — Progress Notes (Signed)
Cardiology Office Note  Date: 01/22/2016   ID: [name], DOB [birth year], MRN [id number]  PCP: Deloria Lair, MD  Primary Cardiologist: Rozann Lesches, MD   Chief Complaint  Patient presents with  . PAF  . Cardiomyopathy    History of Present Illness: [name] is a 75 y.o. male last seen in August 2016. He presents for a routine follow-up visit. He denies any significant palpitations or chest pain since last encounter. States that he is fatigued and sleeps a lot in the afternoons. He continues to follow with Dr. Scotty Court and reports having had a physical with lab work within the last year. He is not reporting any progressive shortness of breath or chest congestion.  He continues on Coumadin with follow-up in the anticoagulation clinic. No unusual bleeding problems reported. Last INR was 2.4.  Device follow-up continues with Dr. Lovena Le with 4.5% mode switching documented in January.  He continues on low-dose amiodarone. I ordered chest x-ray and PFTs at the last visit, although he did not present for this testing. We discussed this today, he states that he prefers to hold off on testing at this time. I did review his ECG which shows sinus rhythm with left bundle branch block.  Past Medical History  Diagnosis Date  . Asthma   . Tachy-brady syndrome ([place])     PPM (SJM) implanted by Dr Caryl Comes 10/19/99, gen change 04/03/08  . Paroxysmal atrial fibrillation (HCC)   . Bronchitis   . Peptic ulcer disease   . History of recurrent UTIs   . History of ruptured appendix   . History of epididymitis   . 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 Pacemaker placement  03/2008    St. Jude, [place]. 5826    Current Outpatient Prescriptions  Medication Sig Dispense Refill  . acetaminophen (TYLENOL) 325 MG tablet Take 650 mg by mouth as needed.    Marland Kitchen amiodarone (PACERONE) 200 MG tablet TAKE (1/2) TABLET BY MOUTH  DAILY. 15 tablet 6  . Homeopathic Products (ALLERGY MEDICINE PO) Take by mouth as needed.    . metoprolol succinate (TOPROL-XL) 25 MG 24 hr tablet Take 1 tablet (25 mg total) by mouth 2 (two) times daily. 60 tablet 0  . sertraline (ZOLOFT) 50 MG tablet Take 50 mg by mouth daily.    Marland Kitchen warfarin (COUMADIN) 2.5 MG tablet TAKE 1 & 1/2 TABLETS DAILY. 45 tablet 4   No current facility-administered medications for this visit.   Allergies:  Bee venom; Codeine; and Corn-containing products   Social History: The patient  reports that he quit smoking about 38 years ago. His smoking use included Cigarettes. He has a 30 pack-year smoking history. He has never used smokeless tobacco. He reports that he does not drink alcohol or use illicit drugs.   ROS:  Please see the history of present illness. Otherwise, complete review of systems is positive for fatigue, somnolence in the afternoons.  Also chronic lower back pain. All other systems are reviewed and negative.   Physical Exam: VS:  BP 124/78 mmHg  Pulse 75  Ht 6' (1.829 m)  Wt 207 lb 12.8 oz (94.257 kg)  BMI 28.18 kg/m2  SpO2 97%, BMI Body mass index is 28.18 kg/(m^2).  Wt Readings from Last 3 Encounters:  01/22/16 207 lb 12.8 oz (94.257 kg)  07/30/15 194 lb (87.998 kg)  12/02/14 199 lb (90.266 kg)  Patient appears comfortable at rest. HEENT: Conjunctiva and lids normal, oropharynx clear.  Neck: Supple, no elevated JVP or carotid bruits, no thyromegaly.  Lungs: Diminished but clear to auscultation, nonlabored breathing at rest.  Cardiac: Regular rate and rhythm, no S3, soft apical systolic murmur.  Abdomen: Soft, nontender, protuberant, bowel sounds present.  Extremities: No pitting edema, distal pulses 2+.  Skin: Warm and dry. Musculoskeletal: No kyphosis. Neuropsychiatric: Alert and oriented 3, affect appropriate.  ECG: I personally reviewed the prior tracing from 12/02/2014 which showed an atrial paced rhythm with left bundle  branch block.  Recent Labwork:  October 2015: Hemoglobin 16, platelets 187, BUN 19, creatinine 0.9, AST 24, ALT 22, TSH 1.5, ESR 5, potassium 3.8  Other Studies Reviewed Today:  Echocardiogram 04/11/2013: Study Conclusions  - Left ventricle: The cavity size was normal. Wall thickness was normal. There was septal-lateral dyssynchrony. Systolic function was moderately reduced. The estimated ejection fraction was in the range of 35% to 40%. Diffuse hypokinesis. Doppler parameters are consistent with abnormal left ventricular relaxation (grade 1 diastolic dysfunction). - Aortic valve: There was no stenosis. - Mitral valve: Calcified annulus. Mildly thickened leaflets . Trivial regurgitation. - Left atrium: The atrium was mildly dilated. - Right ventricle: The cavity size was normal. Pacer wire or catheter noted in right ventricle. Systolic function was normal. - Tricuspid valve: Peak RV-RA gradient:61m Hg (S). - Pulmonary arteries: PA peak pressure: 340mHg (S). - Systemic veins: IVC was not visualized. Impressions:  - Normal LV size with septal-lateral dyssynchrony. Global hypokinesis seemed worse inferoposteriorly, EF 35-40%. Normal RV size and systolic function.  Assessment and Plan:  1. Paroxysmal atrial fibrillation. Symptomatically he has been stable on current medical regimen including low-dose amiodarone and Toprol-XL. Requesting records and Dr. TaScotty Courtegarding last lab work. Previous LFTs and TSH were normal range. He declines follow-up chest x-ray and PFTs. ECG is stable.  2. History of possible tachycardia-mediated cardiomyopathy with LVEF approximately 40%.  3. 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 TaLovena Le 4. Fatigue and somnolence in the afternoons. Consider possible workup for OSA , keep follow with Dr. TaScotty Court Current medicines were reviewed with the patient today.   Orders Placed This  Encounter  Procedures  . EKG 12-Lead    Disposition: FU with me in 6 months.   Signed, SaSatira SarkMD, FAViewmont Surgery Center/23/2017 11:49 AM    [place] [id number]: [phone]: [phone]

## 2016-01-28 DIAGNOSIS — R21 Rash and other nonspecific skin eruption: Secondary | ICD-10-CM | POA: Diagnosis not present

## 2016-02-10 ENCOUNTER — Ambulatory Visit (INDEPENDENT_AMBULATORY_CARE_PROVIDER_SITE_OTHER): Payer: Medicare HMO | Admitting: *Deleted

## 2016-02-10 DIAGNOSIS — Z5181 Encounter for therapeutic drug level monitoring: Secondary | ICD-10-CM

## 2016-02-10 DIAGNOSIS — I480 Paroxysmal atrial fibrillation: Secondary | ICD-10-CM

## 2016-02-10 DIAGNOSIS — Z7901 Long term (current) use of anticoagulants: Secondary | ICD-10-CM | POA: Diagnosis not present

## 2016-02-10 LAB — POCT INR: INR: 2.6

## 2016-03-11 ENCOUNTER — Other Ambulatory Visit: Payer: Self-pay | Admitting: *Deleted

## 2016-03-11 MED ORDER — METOPROLOL SUCCINATE ER 25 MG PO TB24: 25.0000 mg | ORAL_TABLET | Freq: Two times a day (BID) | ORAL | Status: DC

## 2016-03-15 ENCOUNTER — Other Ambulatory Visit: Payer: Self-pay | Admitting: *Deleted

## 2016-03-15 MED ORDER — METOPROLOL SUCCINATE ER 25 MG PO TB24: 25.0000 mg | ORAL_TABLET | Freq: Two times a day (BID) | ORAL | Status: DC

## 2016-03-16 ENCOUNTER — Telehealth: Payer: Self-pay | Admitting: Cardiology

## 2016-03-16 MED ORDER — AMIODARONE HCL 200 MG PO TABS: ORAL_TABLET | ORAL | Status: DC

## 2016-03-16 NOTE — Telephone Encounter (Signed)
amiodarone (PACERONE) 200 MG tablet Please send refills to [facility]

## 2016-03-17 DIAGNOSIS — R399 Unspecified symptoms and signs involving the genitourinary system: Secondary | ICD-10-CM | POA: Diagnosis not present

## 2016-03-17 DIAGNOSIS — R69 Illness, unspecified: Secondary | ICD-10-CM | POA: Diagnosis not present

## 2016-03-17 DIAGNOSIS — I428 Other cardiomyopathies: Secondary | ICD-10-CM | POA: Diagnosis not present

## 2016-03-17 DIAGNOSIS — H6122 Impacted cerumen, left ear: Secondary | ICD-10-CM | POA: Diagnosis not present

## 2016-03-17 DIAGNOSIS — H9191 Unspecified hearing loss, right ear: Secondary | ICD-10-CM | POA: Diagnosis not present

## 2016-03-23 ENCOUNTER — Ambulatory Visit (INDEPENDENT_AMBULATORY_CARE_PROVIDER_SITE_OTHER): Payer: Medicare HMO | Admitting: *Deleted

## 2016-03-23 DIAGNOSIS — Z5181 Encounter for therapeutic drug level monitoring: Secondary | ICD-10-CM | POA: Diagnosis not present

## 2016-03-23 DIAGNOSIS — I480 Paroxysmal atrial fibrillation: Secondary | ICD-10-CM | POA: Diagnosis not present

## 2016-03-23 DIAGNOSIS — Z7901 Long term (current) use of anticoagulants: Secondary | ICD-10-CM

## 2016-03-23 LAB — POCT INR: INR: 2.5

## 2016-04-01 ENCOUNTER — Ambulatory Visit (INDEPENDENT_AMBULATORY_CARE_PROVIDER_SITE_OTHER): Payer: Medicare HMO | Admitting: Internal Medicine

## 2016-04-01 ENCOUNTER — Encounter: Payer: Self-pay | Admitting: Internal Medicine

## 2016-04-01 VITALS — BP 109/76 | HR 75 | Ht 70.0 in | Wt 212.0 lb

## 2016-04-01 DIAGNOSIS — Z950 Presence of cardiac pacemaker: Secondary | ICD-10-CM

## 2016-04-01 DIAGNOSIS — I480 Paroxysmal atrial fibrillation: Secondary | ICD-10-CM

## 2016-04-01 LAB — CUP PACEART INCLINIC DEVICE CHECK
Brady Statistic RV Percent Paced: 1 % — CL
Date Time Interrogation Session: 20170504134159
Implantable Lead Implant Date: 20001120
Implantable Lead Location: 753860
Lead Channel Impedance Value: 310 Ohm
Lead Channel Impedance Value: 422 Ohm
Lead Channel Pacing Threshold Amplitude: 0.75 V
Lead Channel Pacing Threshold Amplitude: 1.5 V
Lead Channel Pacing Threshold Pulse Width: 0.4 ms
Lead Channel Pacing Threshold Pulse Width: 0.4 ms
Lead Channel Sensing Intrinsic Amplitude: 5 mV
Lead Channel Setting Sensing Sensitivity: 2 mV
MDC IDC LEAD IMPLANT DT: 20001120
MDC IDC LEAD LOCATION: 753859
MDC IDC MSMT BATTERY IMPEDANCE: 6600 Ohm
MDC IDC MSMT BATTERY VOLTAGE: 2.75 V
MDC IDC MSMT LEADCHNL RV SENSING INTR AMPL: 8.1 mV
MDC IDC SET LEADCHNL RV PACING AMPLITUDE: 2.5 V
MDC IDC SET LEADCHNL RV PACING PULSEWIDTH: 0.4 ms
MDC IDC STAT BRADY RA PERCENT PACED: 95 %
Pulse Gen Model: 5826
Pulse Gen Serial Number: 2109692

## 2016-04-01 NOTE — Patient Instructions (Signed)
Your physician wants you to follow-up in: 1 Year with Dr. Ladona Ridgel. You will receive a reminder letter in the mail two months in advance. If you don't receive a letter, please call our office to schedule the follow-up appointment.  Your physician recommends that you continue on your current medications as directed. Please refer to the Current Medication list given to you today.  Your physician recommends that you schedule a follow-up appointment in 1 month in the Device Clinic  If you need a refill on your cardiac medications before your next appointment, please call your pharmacy.  Thank you for choosing [facility]!

## 2016-04-01 NOTE — Progress Notes (Signed)
HPI Mr. Careaga returns today for followup. He is a pleasant 75 yo man with a h/o sinus node dysfunction, s/p PPM over 15 years ago.  He returns today for followup.  He denies chest pain. He is sedentary. "Exercise makes me tired". His depression is stable. He takes care of cats and notes to have 20 at his house. No other complaints. Allergies  Allergen Reactions  . Bee Venom     BEE STINGS  . Codeine     REACTION: muscle cramps  . Corn-Containing Products     Corn bread      Current Outpatient Prescriptions  Medication Sig Dispense Refill  . acetaminophen (TYLENOL) 325 MG tablet Take 650 mg by mouth as needed.    Marland Kitchen amiodarone (PACERONE) 200 MG tablet TAKE (1/2) TABLET BY MOUTH DAILY. 15 tablet 6  . Homeopathic Products (ALLERGY MEDICINE PO) Take by mouth as needed.    . metoprolol succinate (TOPROL-XL) 25 MG 24 hr tablet Take 1 tablet (25 mg total) by mouth 2 (two) times daily. 60 tablet 11  . sertraline (ZOLOFT) 50 MG tablet Take 50 mg by mouth daily.    Marland Kitchen warfarin (COUMADIN) 2.5 MG tablet TAKE 1 & 1/2 TABLETS DAILY. 45 tablet 4   No current facility-administered medications for this visit.     Past Medical History  Diagnosis Date  . Asthma   . Tachy-brady syndrome (HCC)     PPM (SJM) implanted by Dr Graciela Husbands 10/19/99, gen change 04/03/08  . Paroxysmal atrial fibrillation (HCC)   . Bronchitis   . Peptic ulcer disease   . History of recurrent UTIs   . History of ruptured appendix   . History of epididymitis   . 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   Past Surgical History  Procedure Laterality Date  . Pacemaker placement  03/2008    St. Jude, [place]. 37     Family History  Problem Relation Age of Onset  . Cancer Father   . Suicidality Mother      Social History   Social History  . Marital Status: Divorced    Spouse Name: [name]  . Number of Children: [name]  .  Years of Education: [name]   Occupational History  . Not on file.   Social History Main Topics  . Smoking status: Former Smoker -- 1.50 packs/day for 20 years    Types: Cigarettes    Start date: 08/20/1959    Quit date: 11/30/1977  . Smokeless tobacco: Never Used  . Alcohol Use: No  . Drug Use: No  . Sexual Activity: Not on file   Other Topics Concern  . Not on file   Social History Narrative     BP 109/76 mmHg  Pulse 75  Ht  (1.778 m)  Wt 212 lb (96.163 kg)  BMI 30.42 kg/m2  Physical Exam:  Well appearing 75 yo man, NAD HEENT: Unremarkable Neck:  6 cm JVD, no thyromegally Lymphatics:  No adenopathy Back:  No CVA tenderness Lungs:  Clear with no wheezes HEART:  Regular rate rhythm, no murmurs, no rubs, no clicks Abd:  soft, positive bowel sounds, no organomegally, no rebound, no guarding Ext:  2 plus pulses, no edema, no cyanosis, no clubbing Skin:  No rashes no nodules Neuro:  CN II through XII intact, motor grossly intact   DEVICE  Normal device  function.  See PaceArt for details.   Assess/Plan: 1. Sinus node dysfunction - he is s/p PPM and doing well. 2. HTN - his blood pressure is well controlled. 3. PAF - he will continue his anti-coagulation and low dose amiodarone. 4. PPM - his St. Jude device is approaching ERI. We discussed PM gen change.  [name].D.

## 2016-04-29 ENCOUNTER — Ambulatory Visit (INDEPENDENT_AMBULATORY_CARE_PROVIDER_SITE_OTHER): Payer: Medicare HMO | Admitting: Otolaryngology

## 2016-04-29 DIAGNOSIS — H9121 Sudden idiopathic hearing loss, right ear: Secondary | ICD-10-CM

## 2016-04-29 DIAGNOSIS — H9041 Sensorineural hearing loss, unilateral, right ear, with unrestricted hearing on the contralateral side: Secondary | ICD-10-CM

## 2016-04-29 DIAGNOSIS — H6122 Impacted cerumen, left ear: Secondary | ICD-10-CM | POA: Diagnosis not present

## 2016-05-03 ENCOUNTER — Other Ambulatory Visit: Payer: Self-pay | Admitting: *Deleted

## 2016-05-03 MED ORDER — WARFARIN SODIUM 2.5 MG PO TABS: ORAL_TABLET | ORAL | Status: DC

## 2016-05-04 ENCOUNTER — Ambulatory Visit (INDEPENDENT_AMBULATORY_CARE_PROVIDER_SITE_OTHER): Payer: Medicare HMO | Admitting: *Deleted

## 2016-05-04 DIAGNOSIS — Z5181 Encounter for therapeutic drug level monitoring: Secondary | ICD-10-CM | POA: Diagnosis not present

## 2016-05-04 DIAGNOSIS — I480 Paroxysmal atrial fibrillation: Secondary | ICD-10-CM

## 2016-05-04 DIAGNOSIS — Z7901 Long term (current) use of anticoagulants: Secondary | ICD-10-CM | POA: Diagnosis not present

## 2016-05-04 LAB — POCT INR: INR: 3.2

## 2016-05-06 ENCOUNTER — Ambulatory Visit (INDEPENDENT_AMBULATORY_CARE_PROVIDER_SITE_OTHER): Payer: Medicare HMO | Admitting: Otolaryngology

## 2016-05-07 ENCOUNTER — Encounter: Payer: Self-pay | Admitting: Internal Medicine

## 2016-05-07 ENCOUNTER — Ambulatory Visit (INDEPENDENT_AMBULATORY_CARE_PROVIDER_SITE_OTHER): Payer: Medicare HMO | Admitting: *Deleted

## 2016-05-07 DIAGNOSIS — I495 Sick sinus syndrome: Secondary | ICD-10-CM

## 2016-05-07 DIAGNOSIS — I480 Paroxysmal atrial fibrillation: Secondary | ICD-10-CM

## 2016-05-07 LAB — CUP PACEART INCLINIC DEVICE CHECK
Battery Impedance: 14800 Ohm
Battery Voltage: 2.65 V
Implantable Lead Implant Date: 20001120
Implantable Lead Implant Date: 20001120
Implantable Lead Location: 753860
Lead Channel Impedance Value: 316 Ohm
Lead Channel Impedance Value: 433 Ohm
Lead Channel Setting Pacing Amplitude: 2.5 V
MDC IDC LEAD LOCATION: 753859
MDC IDC SESS DTM: 20170609110450
MDC IDC SET LEADCHNL RV PACING PULSEWIDTH: 0.4 ms
MDC IDC SET LEADCHNL RV SENSING SENSITIVITY: 2 mV
Pulse Gen Serial Number: 2109692

## 2016-05-07 NOTE — Progress Notes (Signed)
Device check in clinic for battery longevity only (N/C). 0-0.5 years remaining. 8.8% AF burden + warfarin. Patient to f/u with the device clinic/R in 06-2016 for battery check (billable). (Patient requests q63month appts vs qmonth)

## 2016-05-25 ENCOUNTER — Ambulatory Visit (INDEPENDENT_AMBULATORY_CARE_PROVIDER_SITE_OTHER): Payer: Medicare HMO | Admitting: *Deleted

## 2016-05-25 DIAGNOSIS — I480 Paroxysmal atrial fibrillation: Secondary | ICD-10-CM

## 2016-05-25 DIAGNOSIS — Z5181 Encounter for therapeutic drug level monitoring: Secondary | ICD-10-CM | POA: Diagnosis not present

## 2016-05-25 DIAGNOSIS — Z7901 Long term (current) use of anticoagulants: Secondary | ICD-10-CM | POA: Diagnosis not present

## 2016-05-25 LAB — POCT INR: INR: 3

## 2016-06-08 ENCOUNTER — Telehealth: Payer: Self-pay | Admitting: *Deleted

## 2016-06-08 NOTE — Telephone Encounter (Signed)
Pt has walked into office c/o SOB off and on - says gained 10lbs in the last few weeks and some swelling in abdomen. Says some L sided pain going on for last few week. Does say he has not ate well. BP today was 100/72 HR 70 O2 95%. Pt requesting appt with [name] to discuss symptoms, offered extender appt but pt declined. Pt scheduled for 7/12 @ 920am will forward to provider

## 2016-06-09 ENCOUNTER — Ambulatory Visit (INDEPENDENT_AMBULATORY_CARE_PROVIDER_SITE_OTHER): Payer: Medicare HMO | Admitting: Cardiology

## 2016-06-09 ENCOUNTER — Encounter: Payer: Self-pay | Admitting: Cardiology

## 2016-06-09 VITALS — BP 144/78 | HR 76 | Ht 70.5 in | Wt 212.0 lb

## 2016-06-09 DIAGNOSIS — Z950 Presence of cardiac pacemaker: Secondary | ICD-10-CM

## 2016-06-09 DIAGNOSIS — R9431 Abnormal electrocardiogram [ECG] [EKG]: Secondary | ICD-10-CM

## 2016-06-09 DIAGNOSIS — I429 Cardiomyopathy, unspecified: Secondary | ICD-10-CM

## 2016-06-09 DIAGNOSIS — R072 Precordial pain: Secondary | ICD-10-CM

## 2016-06-09 DIAGNOSIS — I495 Sick sinus syndrome: Secondary | ICD-10-CM

## 2016-06-09 DIAGNOSIS — I480 Paroxysmal atrial fibrillation: Secondary | ICD-10-CM | POA: Diagnosis not present

## 2016-06-09 DIAGNOSIS — R0602 Shortness of breath: Secondary | ICD-10-CM

## 2016-06-09 NOTE — Progress Notes (Signed)
Cardiology Office Note  Date: 06/09/2016   ID: [name], DOB 04/12/1941, MRN [id number]  PCP: Cameron Boston, MD  Primary Cardiologist: Cameron Dell, MD   Chief Complaint  Patient presents with  . Shortness of Breath  . Episode of chest pain    History of Present Illness: [name] is a 75 y.o. male last seen in February. He called to arrange a follow-up visit secondary to progressive fatigue and dyspnea on exertion over the last several weeks. He also experienced an episode of chest tightness last Thursday that caused him to stop what he was doing. Symptoms lasted for about 15-20 minutes and went away on their own. 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 No known history of ischemic heart disease however.  Follow-up with Dr. Ladona Thomas noted in May, I reviewed the note. Most recent device check showed 8.8% atrial fibrillation burden.  He continues on Coumadin with follow-up in the anticoagulation clinic. He does not report any bleeding problems. He also continues on amiodarone, normal TSH and LFTs in October 2016. He has declined chest x-ray and PFTs as outlined in the prior note.  Today we discussed arranging follow-up cardiac structural and ischemic testing in light of his symptoms.  Past Medical History  Diagnosis Date  . Asthma   . Tachy-brady syndrome (HCC)     PPM (SJM) implanted by Dr [name] 10/19/99, gen change 04/03/08  . Paroxysmal atrial fibrillation (HCC)   . Bronchitis   . Peptic ulcer disease   . History of recurrent UTIs   . History of ruptured appendix   . History of epididymitis   . Cardiomyopathy (HCC)     Possibly tachycardia mediated    Past Surgical History  Procedure Laterality Date  . Pacemaker placement  03/2008    [employer]. Jude, [place]. 6295    Current Outpatient Prescriptions  Medication Sig Dispense Refill  . acetaminophen (TYLENOL) 325 MG tablet Take 650 mg by mouth as  needed.    Marland Kitchen amiodarone (PACERONE) 200 MG tablet TAKE (1/2) TABLET BY MOUTH DAILY. 15 tablet 6  . Homeopathic Products (ALLERGY MEDICINE PO) Take by mouth as needed.    . metoprolol succinate (TOPROL-XL) 25 MG 24 hr tablet Take 1 tablet (25 mg total) by mouth 2 (two) times daily. 60 tablet 11  . sertraline (ZOLOFT) 50 MG tablet Take 25 mg by mouth daily.     Marland Kitchen warfarin (COUMADIN) 2.5 MG tablet TAKE 1 & 1/2 TABLETS DAILY. 45 tablet 4   No current facility-administered medications for this visit.   Allergies:  Bee venom; Codeine; and Corn-containing products   Social History: The patient  reports that he quit smoking about 38 years ago. His smoking use included Cigarettes. He started smoking about 56 years ago. He has a 30 pack-year smoking history. He has never used smokeless tobacco. He reports that he does not drink alcohol or use illicit drugs.   ROS:  Please see the history of present illness. Otherwise, complete review of systems is positive for transient right-sided hearing loss, saw ENT.  All other systems are reviewed and negative.   Physical Exam: VS:  BP 144/78 mmHg  Pulse 76  Ht 5' 10.5" (1.791 m)  Wt 212 lb (96.163 kg)  BMI 29.98 kg/m2  SpO2 96%, BMI Body mass index is 29.98 kg/(m^2).  Wt Readings from Last 3 Encounters:  06/09/16 212 lb (96.163 kg)  04/01/16 212 lb (96.163 kg)  01/22/16 207  lb 12.8 oz (94.257 kg)    Overweight male, appears comfortable at rest. HEENT: Conjunctiva and lids normal, oropharynx clear.  Neck: Supple, no elevated JVP or carotid bruits, no thyromegaly.  Lungs: Diminished but clear to auscultation, nonlabored breathing at rest.  Cardiac: Indistinct PMI, Regular rate and rhythm, no S3, soft apical systolic murmur.  Abdomen: Soft, nontender, protuberant, bowel sounds present.  Extremities: No pitting edema, distal pulses 2+.  Skin: Warm and dry. Musculoskeletal: No kyphosis. Neuropsychiatric: Alert and oriented 3, affect  appropriate.  ECG: I personally reviewed the tracing from 04/01/2016 which showed sinus rhythm with left bundle branch block.  Recent Labwork:  October 2016: Hemoglobin 16.1, platelets 202, BUN 24, creatinine 0.9, AST 21, ALT 21, potassium 4.4, TSH 1.5  Other Studies Reviewed Today:  Echocardiogram 04/11/2013: Study Conclusions  - Left ventricle: The cavity size was normal. Wall thickness was normal. There was septal-lateral dyssynchrony. Systolic function was moderately reduced. The estimated ejection fraction was in the range of 35% to 40%. Diffuse hypokinesis. Doppler parameters are consistent with abnormal left ventricular relaxation (grade 1 diastolic dysfunction). - Aortic valve: There was no stenosis. - Mitral valve: Calcified annulus. Mildly thickened leaflets . Trivial regurgitation. - Left atrium: The atrium was mildly dilated. - Right ventricle: The cavity size was normal. Pacer wire or catheter noted in right ventricle. Systolic function was normal. - Tricuspid valve: Peak RV-RA gradient:68mm Hg (S). - Pulmonary arteries: PA peak pressure: 67mm Hg (S). - Systemic veins: IVC was not visualized. Impressions:  - Normal LV size with septal-lateral dyssynchrony. Global hypokinesis seemed worse inferoposteriorly, EF 35-40%. Normal RV size and systolic function.  Assessment and Plan:  1. Progressive dyspnea on exertion and an episode of chest tightness described last week as outlined above. Concerning for angina although he does not have any documented history of obstructive CAD. History includes tachycardia-mediated cardiomyopathy with moderately reduced LVEF based on assessment in 2014. We will plan a follow-up echocardiogram as well as a Lexiscan Myoview and then bring him back to the office to discuss the results.  2. Paroxysmal atrial fibrillation, continues on amiodarone and Coumadin with fairly low recurrence documented on device  interrogation.  3. Tachycardia-bradycardia syndrome status post pacemaker placement, follows with Dr. Ladona Thomas.  4. Elevated blood pressure, no standing history of hypertension. We will follow-up on his cardiac testing and then discuss potential medication adjustments.  Current medicines were reviewed with the patient today.   Orders Placed This Encounter  Procedures  . NM Myocar Multi W/Spect W/Wall Motion / EF  . ECHOCARDIOGRAM COMPLETE    Disposition: Follow-up with me in the next 3 or 4 weeks in [place].  Signed, Jonelle Sidle, MD, [facility] 06/09/2016 9:53 AM    [facility] at [facility] [address] [place], [place] [address] Phone: [phone]; Fax: [phone]

## 2016-06-09 NOTE — Patient Instructions (Signed)
Medication Instructions:  Your physician recommends that you continue on your current medications as directed. Please refer to the Current Medication list given to you today.   Labwork: NONE  Testing/Procedures: Your physician has requested that you have an echocardiogram. Echocardiography is a painless test that uses sound waves to create images of your heart. It provides your doctor with information about the size and shape of your heart and how well your heart's chambers and valves are working. This procedure takes approximately one hour. There are no restrictions for this procedure.  Your physician has requested that you have a lexiscan myoview. For further information please visit [email or web address] Please follow instruction sheet, as given.    Follow-Up: Your physician recommends that you schedule a follow-up appointment in: 3 WEEKS    Any Other Special Instructions Will Be Listed Below (If Applicable).     If you need a refill on your cardiac medications before your next appointment, please call your pharmacy.

## 2016-06-16 ENCOUNTER — Encounter (HOSPITAL_COMMUNITY)
Admission: RE | Admit: 2016-06-16 | Discharge: 2016-06-16 | Disposition: A | Discharge status: Home / Self Care | Payer: Medicare HMO | Source: Ambulatory Visit | Attending: Cardiology | Admitting: Cardiology

## 2016-06-16 ENCOUNTER — Inpatient Hospital Stay (HOSPITAL_COMMUNITY): Admission: RE | Admit: 2016-06-16 | Payer: Medicare HMO | Source: Ambulatory Visit

## 2016-06-16 ENCOUNTER — Encounter (HOSPITAL_COMMUNITY): Payer: Self-pay

## 2016-06-16 ENCOUNTER — Ambulatory Visit (HOSPITAL_COMMUNITY)
Admission: RE | Admit: 2016-06-16 | Discharge: 2016-06-16 | Disposition: A | Discharge status: Home / Self Care | Payer: Medicare HMO | Source: Ambulatory Visit | Attending: Cardiology | Admitting: Cardiology

## 2016-06-16 DIAGNOSIS — I510 Cardiac septal defect, acquired: Secondary | ICD-10-CM | POA: Diagnosis not present

## 2016-06-16 DIAGNOSIS — I340 Nonrheumatic mitral (valve) insufficiency: Secondary | ICD-10-CM | POA: Diagnosis not present

## 2016-06-16 DIAGNOSIS — I429 Cardiomyopathy, unspecified: Secondary | ICD-10-CM

## 2016-06-16 DIAGNOSIS — R9431 Abnormal electrocardiogram [ECG] [EKG]: Secondary | ICD-10-CM | POA: Insufficient documentation

## 2016-06-16 DIAGNOSIS — I358 Other nonrheumatic aortic valve disorders: Secondary | ICD-10-CM | POA: Insufficient documentation

## 2016-06-16 DIAGNOSIS — R072 Precordial pain: Secondary | ICD-10-CM

## 2016-06-16 DIAGNOSIS — I447 Left bundle-branch block, unspecified: Secondary | ICD-10-CM | POA: Insufficient documentation

## 2016-06-16 DIAGNOSIS — I517 Cardiomegaly: Secondary | ICD-10-CM | POA: Insufficient documentation

## 2016-06-16 DIAGNOSIS — I071 Rheumatic tricuspid insufficiency: Secondary | ICD-10-CM | POA: Insufficient documentation

## 2016-06-16 DIAGNOSIS — I5189 Other ill-defined heart diseases: Secondary | ICD-10-CM | POA: Diagnosis not present

## 2016-06-16 DIAGNOSIS — R079 Chest pain, unspecified: Secondary | ICD-10-CM | POA: Diagnosis present

## 2016-06-16 LAB — NM MYOCAR MULTI W/SPECT W/WALL MOTION / EF
CHL CUP NUCLEAR SDS: 6
CHL CUP NUCLEAR SRS: 3
CHL CUP NUCLEAR SSS: 9
LHR: 0.05
LV dias vol: 93 mL (ref 62–150)
LVSYSVOL: 44 mL
Peak HR: 77 {beats}/min
Rest HR: 75 {beats}/min
TID: 0.95

## 2016-06-16 LAB — ECHOCARDIOGRAM COMPLETE
AVLVOTPG: 5 mmHg
CHL CUP MV DEC (S): 209
CHL CUP TV REG PEAK VELOCITY: 221 cm/s
E/e' ratio: 10.7
EWDT: 209 ms
FS: 18 % — AB (ref 28–44)
IV/PV OW: 1.27
LA diam end sys: 39 mm
LA vol index: 20.7 mL/m2
LA vol: 45.8 mL
LADIAMINDEX: 1.76 cm/m2
LASIZE: 39 mm
LAVOLA4C: 46.6 mL
LDCA: 3.14 cm2
LV E/e' medial: 10.7
LV E/e'average: 10.7
LV PW d: 12 mm — AB (ref 0.6–1.1)
LV TDI E'LATERAL: 5.98
LV TDI E'MEDIAL: 5.33
LV e' LATERAL: 5.98 cm/s
LVDIAVOL: 99 mL (ref 62–150)
LVDIAVOLIN: 45 mL/m2
LVOT SV: 68 mL
LVOT VTI: 21.7 cm
LVOT peak vel: 115 cm/s
LVOTD: 20 mm
LVSYSVOL: 57 mL (ref 21–61)
LVSYSVOLIN: 26 mL/m2
MV pk E vel: 64 m/s
MVPKAVEL: 75 m/s
RV LATERAL S' VELOCITY: 15.7 cm/s
RV TAPSE: 28.5 mm
RV sys press: 23 mmHg
Simpson's disk: 43
Stroke v: 42 ml
TRMAXVEL: 221 cm/s

## 2016-06-16 MED ORDER — TECHNETIUM TC 99M TETROFOSMIN IV KIT
10.0000 | PACK | Freq: Once | INTRAVENOUS | Status: AC | PRN
  Administered 2016-06-16: 10.4 via INTRAVENOUS

## 2016-06-16 MED ORDER — TECHNETIUM TC 99M TETROFOSMIN IV KIT
30.0000 | PACK | Freq: Once | INTRAVENOUS | Status: AC | PRN
  Administered 2016-06-16: 31 via INTRAVENOUS

## 2016-06-16 MED ORDER — REGADENOSON 0.4 MG/5ML IV SOLN
INTRAVENOUS | Status: AC
  Administered 2016-06-16: 0.4 mg
  Filled 2016-06-16: qty 5

## 2016-06-16 NOTE — Progress Notes (Signed)
*  PRELIMINARY RESULTS* Echocardiogram 2D Echocardiogram has been performed.  Stacey Drain 06/16/2016, 12:56 PM

## 2016-06-17 ENCOUNTER — Telehealth: Payer: Self-pay | Admitting: *Deleted

## 2016-06-17 NOTE — Telephone Encounter (Signed)
-----   Message from Jonelle Sidle, MD sent at 06/16/2016  4:23 PM EDT ----- Results reviewed. LVEF somewhat better than previous assessment, in the range of 40-45% by this study and higher by the stress test results. Still could be having some shortness of breath in the setting of cardiomyopathy, and we should discuss potential medication adjustments at office follow-up. A copy of this test should be forwarded to [facility] B, MD.

## 2016-06-17 NOTE — Telephone Encounter (Signed)
-----   Message from Jonelle Sidle, MD sent at 06/16/2016  4:24 PM EDT ----- Results reviewed. This study suggests possibly a mild degree of ischemia as outlined, although some of this defect may be artifactual. Importantly, there are no large obvious areas of ischemia and LVEF was 53%. We should talk about these results and his symptoms in more detail at office follow-up and decide whether we are going to go with medication adjustments or pursue other invasive cardiac testing. A copy of this test should be forwarded to [facility] B, MD.

## 2016-06-17 NOTE — Telephone Encounter (Signed)
Patient informed and verbalized understanding of plan. 

## 2016-06-22 ENCOUNTER — Ambulatory Visit (INDEPENDENT_AMBULATORY_CARE_PROVIDER_SITE_OTHER): Payer: Medicare HMO | Admitting: *Deleted

## 2016-06-22 DIAGNOSIS — Z5181 Encounter for therapeutic drug level monitoring: Secondary | ICD-10-CM | POA: Diagnosis not present

## 2016-06-22 DIAGNOSIS — I480 Paroxysmal atrial fibrillation: Secondary | ICD-10-CM

## 2016-06-22 DIAGNOSIS — I4891 Unspecified atrial fibrillation: Secondary | ICD-10-CM | POA: Diagnosis not present

## 2016-06-22 LAB — POCT INR
INR: 2.5
INR: 2.5

## 2016-06-30 NOTE — Progress Notes (Signed)
Cardiology Office Note  Date: 07/12/2016   ID: [name], DOB 1941-07-20, MRN [id number]  PCP: Louie Boston, MD  Primary Cardiologist: [name], MD   Chief Complaint  Patient presents with  . Follow-up testing  . Cardiomyopathy    History of Present Illness: [name] is a 75 y.o. male seen recently in July. He presents to follow up on recent cardiac testing including echocardiogram and Myoview as outlined below. Continues to complain of being washed out with fatigue and dyspnea and exertion. No exertional chest pain or palpitations. We went over the results of his testing which actually show relative stability in terms of LVEF with no major ischemic territories.  He continues on Coumadin with follow-up in the anticoagulation clinic. He does not report any bleeding problems. He also continues on amiodarone, normal TSH and LFTs in October 2016. He has declined chest x-ray and PFTs as outlined in the prior notes.  Today we discussed options of considering medication adjustments versus pursuing additional invasive testing in light of his symptoms. He tells me that he would prefer conservative approach overall at this time.  Past Medical History:  Diagnosis Date  . Asthma   . Bronchitis   . 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 History of epididymitis   . History of recurrent UTIs   . History of ruptured appendix   . Paroxysmal atrial fibrillation (HCC)   . Peptic ulcer disease   . Tachy-brady syndrome (HCC)    PPM (SJM) implanted by Dr Graciela Husbands 10/19/99, gen change 04/03/08    Past Surgical History:  Procedure Laterality Date  . PACEMAKER PLACEMENT  03/2008   St. Jude, [place]. 8768    Current Outpatient Prescriptions  Medication Sig Dispense Refill  . acetaminophen (TYLENOL) 325 MG tablet Take 650 mg by mouth as needed.    Marland Kitchen amiodarone (PACERONE) 200 MG tablet TAKE (1/2) TABLET BY MOUTH DAILY. 15 tablet 6  . Homeopathic Products  (ALLERGY MEDICINE PO) Take by mouth as needed.    . metoprolol succinate (TOPROL-XL) 25 MG 24 hr tablet Take 1 tablet (25 mg total) by mouth 2 (two) times daily. 60 tablet 11  . sertraline (ZOLOFT) 50 MG tablet Take 25 mg by mouth daily.     Marland Kitchen warfarin (COUMADIN) 2.5 MG tablet TAKE 1 & 1/2 TABLETS DAILY. 45 tablet 4  . spironolactone (ALDACTONE) 25 MG tablet Take 0.5 tablets (12.5 mg total) by mouth daily. 45 tablet 1   No current facility-administered medications for this visit.    Allergies:  Bee venom; Codeine; and Corn-containing products   Social History: The patient  reports that he quit smoking about 38 years ago. His smoking use included Cigarettes. He started smoking about 56 years ago. He has a 30.00 pack-year smoking history. He has never used smokeless tobacco. He reports that he does not drink alcohol or use drugs.   ROS:  Please see the history of present illness. Otherwise, complete review of systems is positive for decreased hearing, memory problems.  All other systems are reviewed and negative.   Physical Exam: VS:  BP 110/72   Pulse 76   Ht 5\' 10"  (1.778 m)   Wt 214 lb (97.1 kg)   SpO2 96%   BMI 30.71 kg/m , BMI Body mass index is 30.71 kg/m.  Wt Readings from Last 3 Encounters:  07/12/16 214 lb (97.1 kg)  06/09/16 212 lb (96.2 kg)  04/01/16 212 lb (96.2 kg)  Overweight male, appears comfortable at rest. HEENT: Conjunctiva and lids normal, oropharynx clear.  Neck: Supple, no elevated JVP or carotid bruits, no thyromegaly.  Lungs: Diminished but clear to auscultation, nonlabored breathing at rest.  Cardiac: Indistinct PMI, Regular rate and rhythm, no S3, soft apical systolic murmur.  Abdomen: Soft, nontender, protuberant, bowel sounds present.  Extremities: No pitting edema, distal pulses 2+.  Skin: Warm and dry. Musculoskeletal: No kyphosis. Neuropsychiatric: Alert and oriented 3, affect appropriate.  ECG: I personally reviewed the tracing from  04/01/2016 which showed sinus rhythm with left bundle branch block.  Recent Labwork:  October 2016: Hemoglobin 16.1, platelets 202, BUN 24, creatinine 0.9, AST 21, ALT 21, potassium 4.4, TSH 1.5  Other Studies Reviewed Today:  Echocardiogram 06/16/2016: Study Conclusions  - Left ventricle: The cavity size was normal. Wall thickness was   increased increased in a pattern of mild to moderate LVH.   Systolic function was mildly to moderately reduced. The estimated   ejection fraction was in the range of 40% to 45%. Diffuse   hypokinesis. Doppler parameters are consistent with abnormal left   ventricular relaxation (grade 1 diastolic dysfunction). - Ventricular septum: Septal motion showed abnormal function and   dyssynergy. - Aortic valve: Trileaflet; mildly calcified leaflets. - Mitral valve: Calcified annulus. There was trivial regurgitation. - Right ventricle: Pacer wire or catheter noted in right ventricle. - Right atrium: The atrium was mildly dilated. Central venous   pressure (est): 3 mm Hg. - Atrial septum: No defect or patent foramen ovale was identified. - Tricuspid valve: There was trivial regurgitation. - Pulmonary arteries: PA peak pressure: 23 mm Hg (S). - Pericardium, extracardiac: There was no pericardial effusion.  Impressions:  - Mild to moderate LVH with LVEF 40-45%. There is diffuse   hypokinesis with septal dyssynergy. Grade 1 diastolic dysfunction   with intermediate LV filling pressure. MAC with trivial mitral   regurgitation. Device wire noted within the right heart. Mildly   dilated right atrium. Trivial tricuspid regurgitation with normal   estimated PASP 23 mmHg.  Lexiscan Myoview 06/16/2016:  ECG was uninterpretable due to left bundle branch block. Left bundle branch block present throughout Lexiscan infusion.  Small, mild intensity, mid to apical inferoseptal defect is reversible and suggestive of a mild degree of ischemia, although RV pacing or IVCD  may also contribute to a defect in this region. No other large ischemic territories are identified.  This is a low risk study.  Nuclear stress EF: 53%.  Assessment and Plan:  1. Fatigue and dyspnea on exertion, possibly multifactorial. Recent cardiac testing reveals stable LVEF at approximately 45%, no large ischemic territories with possible region of mild inferoseptal ischemia although defect could be present in the setting of RV pacing with IVCD. After discussion he would prefer conservative approach and we will initiate Aldactone 12.5 mg daily in addition to Toprol-XL. Might be able to add low dose ARB but his blood pressure is low normal. Follow-up BMET in 2 weeks.  2. Sick sinus syndrome, St. 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  3. Paroxysmal atrial fibrillation, generally well controlled over time on low-dose amiodarone. He also continues on Coumadin. No reported bleeding problems.  4. History of tachycardia-mediated cardiomyopathy, LVEF 45-50% based on recent echocardiogram and Myoview.  Current medicines were reviewed with the patient today.   Orders Placed This Encounter  Procedures  . Basic metabolic panel    Disposition: Follow with me in 3 months.  Signed, Jonelle Sidle, MD, [facility] 07/12/2016 11:05 AM  [facility] [place], [place], [place] [address] Phone: [phone]; Fax: [phone]

## 2016-07-12 ENCOUNTER — Encounter: Payer: Self-pay | Admitting: Cardiology

## 2016-07-12 ENCOUNTER — Ambulatory Visit (INDEPENDENT_AMBULATORY_CARE_PROVIDER_SITE_OTHER): Payer: Medicare HMO | Admitting: Cardiology

## 2016-07-12 VITALS — BP 110/72 | HR 76 | Ht 70.0 in | Wt 214.0 lb

## 2016-07-12 DIAGNOSIS — I495 Sick sinus syndrome: Secondary | ICD-10-CM | POA: Diagnosis not present

## 2016-07-12 DIAGNOSIS — I480 Paroxysmal atrial fibrillation: Secondary | ICD-10-CM

## 2016-07-12 DIAGNOSIS — I429 Cardiomyopathy, unspecified: Secondary | ICD-10-CM | POA: Diagnosis not present

## 2016-07-12 MED ORDER — SPIRONOLACTONE 25 MG PO TABS: 12.5000 mg | ORAL_TABLET | Freq: Every day | ORAL | 1 refills | Status: DC

## 2016-07-12 NOTE — Patient Instructions (Signed)
Medication Instructions:   Your physician has recommended you make the following change in your medication:   Start spironolactone 12.5 mg daily.  Continue all other medications the same.  Labwork:  Your physician recommends that you return for lab work in: 2 weeks around 07/26/16 to check your BMET. Your lab work order has been given to you today during your office visit. Please take this with you to the lab.  Testing/Procedures: NONE  Follow-Up:  Your physician recommends that you schedule a follow-up appointment in: 3 months.  Any Other Special Instructions Will Be Listed Below (If Applicable).  If you need a refill on your cardiac medications before your next appointment, please call your pharmacy.

## 2016-07-13 DIAGNOSIS — I480 Paroxysmal atrial fibrillation: Secondary | ICD-10-CM | POA: Diagnosis not present

## 2016-07-13 DIAGNOSIS — I429 Cardiomyopathy, unspecified: Secondary | ICD-10-CM | POA: Diagnosis not present

## 2016-07-28 ENCOUNTER — Ambulatory Visit (INDEPENDENT_AMBULATORY_CARE_PROVIDER_SITE_OTHER): Payer: Medicare HMO | Admitting: *Deleted

## 2016-07-28 ENCOUNTER — Encounter: Payer: Self-pay | Admitting: Internal Medicine

## 2016-07-28 DIAGNOSIS — Z950 Presence of cardiac pacemaker: Secondary | ICD-10-CM | POA: Diagnosis not present

## 2016-07-28 DIAGNOSIS — I480 Paroxysmal atrial fibrillation: Secondary | ICD-10-CM | POA: Diagnosis not present

## 2016-07-28 LAB — CUP PACEART INCLINIC DEVICE CHECK
Battery Impedance: 19600 Ohm
Battery Voltage: 2.64 V
Brady Statistic RA Percent Paced: 85 %
Brady Statistic RV Percent Paced: 1 % — CL
Date Time Interrogation Session: 20170830163411
Implantable Lead Implant Date: 20001120
Implantable Lead Implant Date: 20001120
Implantable Lead Location: 753859
Implantable Lead Location: 753860
Lead Channel Impedance Value: 289 Ohm
Lead Channel Impedance Value: 450 Ohm
Lead Channel Pacing Threshold Amplitude: 0.75 V
Lead Channel Pacing Threshold Amplitude: 1.25 V
Lead Channel Pacing Threshold Pulse Width: 0.4 ms
Lead Channel Pacing Threshold Pulse Width: 0.4 ms
Lead Channel Sensing Intrinsic Amplitude: 4.1 mV
Lead Channel Sensing Intrinsic Amplitude: 8.7 mV
Lead Channel Setting Pacing Amplitude: 2 V
Lead Channel Setting Pacing Amplitude: 2.5 V
Lead Channel Setting Pacing Pulse Width: 0.4 ms
Lead Channel Setting Sensing Sensitivity: 2 mV
Pulse Gen Model: 5826
Pulse Gen Serial Number: 2109692

## 2016-07-28 NOTE — Progress Notes (Signed)
Pacemaker check in clinic. PPM at [facility] as of 07/13/16. Normal device function. Thresholds, sensing, impedances consistent with previous measurements. Device programmed to maximize longevity. 9.6% AT/AF- on warfarin for PAF. No high ventricular rates noted. Device programmed at appropriate safety margins. Histogram distribution appropriate for patient activity level. Device programmed to optimize intrinsic conduction. Follow up with GT to discuss generator replacement- TBD.

## 2016-08-03 ENCOUNTER — Ambulatory Visit (INDEPENDENT_AMBULATORY_CARE_PROVIDER_SITE_OTHER): Payer: Medicare HMO | Admitting: *Deleted

## 2016-08-03 DIAGNOSIS — Z5181 Encounter for therapeutic drug level monitoring: Secondary | ICD-10-CM | POA: Diagnosis not present

## 2016-08-03 DIAGNOSIS — I4891 Unspecified atrial fibrillation: Secondary | ICD-10-CM

## 2016-08-03 DIAGNOSIS — I480 Paroxysmal atrial fibrillation: Secondary | ICD-10-CM | POA: Diagnosis not present

## 2016-08-03 LAB — POCT INR: INR: 2.7

## 2016-08-05 IMAGING — NM NM MYOCAR MULTI W/SPECT W/WALL MOTION & EF
2 series · 12 of 12 positions shown · non-contrast
Comparison: none

[Series 1: rest · 8.28mm/px · 6 of 64 frames shown]
[frame 6/64]
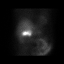
[frame 16/64]
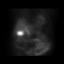
[frame 27/64]
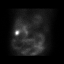
[frame 38/64]
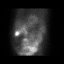
[frame 48/64]
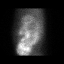
[frame 59/64]
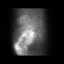

[Series 2: stress gated · 8.28mm/px · 6 of 64 frames shown]
[frame 6/64]
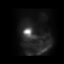
[frame 16/64]
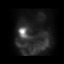
[frame 27/64]
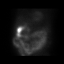
[frame 38/64]
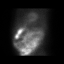
[frame 48/64]
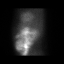
[frame 59/64]
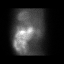

[12 of 12 positions shown; findings below may reference images not displayed]

Canned report from images found in remote index.

Refer to host system for actual result text.

## 2016-08-20 ENCOUNTER — Encounter: Payer: Medicare HMO | Admitting: Internal Medicine

## 2016-09-14 ENCOUNTER — Ambulatory Visit (INDEPENDENT_AMBULATORY_CARE_PROVIDER_SITE_OTHER): Payer: Medicare HMO | Admitting: *Deleted

## 2016-09-14 DIAGNOSIS — I480 Paroxysmal atrial fibrillation: Secondary | ICD-10-CM | POA: Diagnosis not present

## 2016-09-14 DIAGNOSIS — I4891 Unspecified atrial fibrillation: Secondary | ICD-10-CM

## 2016-09-14 DIAGNOSIS — Z5181 Encounter for therapeutic drug level monitoring: Secondary | ICD-10-CM

## 2016-09-14 LAB — POCT INR: INR: 3

## 2016-09-22 DIAGNOSIS — Z23 Encounter for immunization: Secondary | ICD-10-CM | POA: Diagnosis not present

## 2016-09-29 ENCOUNTER — Other Ambulatory Visit: Payer: Self-pay | Admitting: Cardiology

## 2016-10-26 ENCOUNTER — Ambulatory Visit (INDEPENDENT_AMBULATORY_CARE_PROVIDER_SITE_OTHER): Payer: Medicare HMO | Admitting: *Deleted

## 2016-10-26 DIAGNOSIS — Z5181 Encounter for therapeutic drug level monitoring: Secondary | ICD-10-CM

## 2016-10-26 DIAGNOSIS — I480 Paroxysmal atrial fibrillation: Secondary | ICD-10-CM

## 2016-10-26 DIAGNOSIS — I4891 Unspecified atrial fibrillation: Secondary | ICD-10-CM | POA: Diagnosis not present

## 2016-10-26 LAB — POCT INR: INR: 2.5

## 2016-11-07 NOTE — Progress Notes (Signed)
Cardiology Office Note  Date: 11/08/2016   ID: [name], DOB 1941-01-27, MRN [id number]  PCP: Cameron Boston, MD  Primary Cardiologist: Cameron Dell, MD   Chief Complaint  Patient presents with  . Cardiomyopathy    History of Present Illness: [name] is a 75 y.o. male last seen in August. He presents for a routine follow-up visit. Reports stable dyspnea on exertion, no palpitations or chest pain. He has tolerated medication adjustments including the addition of Aldactone.  He continues on Coumadin with follow-up in the anticoagulation clinic. Ports no spontaneous bleeding problems.  He follows with Dr. Ladona Thomas in the device clinic, St. Jude device in place. Last device check showed approximately 10% AF. He is due for a follow-up visit later this month.  We went over his medications which are outlined below.  Past Medical History:  Diagnosis Date  . Asthma   . Bronchitis   . 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 History of epididymitis   . History of recurrent UTIs   . History of ruptured appendix   . Paroxysmal atrial fibrillation (HCC)   . Peptic ulcer disease   . Tachy-brady syndrome (HCC)    PPM (SJM) implanted by Dr [name] 10/19/99, gen change 04/03/08    Past Surgical History:  Procedure Laterality Date  . PACEMAKER PLACEMENT  03/2008   St. Jude, [place]. 3094    Current Outpatient Prescriptions  Medication Sig Dispense Refill  . acetaminophen (TYLENOL) 325 MG tablet Take 650 mg by mouth as needed.    Marland Kitchen amiodarone (PACERONE) 200 MG tablet TAKE (1/2) TABLET BY MOUTH DAILY. 15 tablet 6  . Homeopathic Products (ALLERGY MEDICINE PO) Take by mouth as needed.    . metoprolol succinate (TOPROL-XL) 25 MG 24 hr tablet Take 1 tablet (25 mg total) by mouth 2 (two) times daily. 60 tablet 11  . sertraline (ZOLOFT) 50 MG tablet Take 25 mg by mouth daily.     Marland Kitchen warfarin (COUMADIN) 2.5 MG tablet TAKE ONE & ONE-HALF TABLETS BY MOUTH ONCE  DAILY 45 tablet 4  . spironolactone (ALDACTONE) 25 MG tablet Take 0.5 tablets (12.5 mg total) by mouth daily. 45 tablet 1   No current facility-administered medications for this visit.    Allergies:  Bee venom; Codeine; and Corn-containing products   Social History: The patient  reports that he quit smoking about 38 years ago. His smoking use included Cigarettes. He started smoking about 57 years ago. He has a 30.00 pack-year smoking history. He has never used smokeless tobacco. He reports that he does not drink alcohol or use drugs.   ROS:  Please see the history of present illness. Otherwise, complete review of systems is positive for urinary frequency.  All other systems are reviewed and negative.   Physical Exam: VS:  BP 104/70   Pulse 67   Ht 5\' 10"  (1.778 m)   Wt 219 lb (99.3 kg)   SpO2 94%   BMI 31.42 kg/m , BMI Body mass index is 31.42 kg/m.  Wt Readings from Last 3 Encounters:  11/08/16 219 lb (99.3 kg)  07/12/16 214 lb (97.1 kg)  06/09/16 212 lb (96.2 kg)    Overweight male, appears comfortable at rest. HEENT: Conjunctiva and lids normal, oropharynx clear.  Neck: Supple, no elevated JVP or carotid bruits, no thyromegaly.  Lungs: Diminished but clear to auscultation, nonlabored breathing at rest.  Cardiac: Indistinct PMI, Regular rate and rhythm, no S3, soft apical systolic murmur.  Abdomen: Soft, nontender, protuberant, bowel sounds present.  Extremities: No pitting edema, distal pulses 2+.  Skin: Warm and dry. Musculoskeletal: No kyphosis. Neuropsychiatric: Alert and oriented 3, affect appropriate.  ECG: I personally reviewed the tracing from 04/01/2016 which showed sinus rhythm with left bundle branch block.  Recent Labwork:  October 2016: Hemoglobin 16.1, platelets 202, BUN 24, creatinine 0.9, AST 21, ALT 21, potassium 4.4, TSH 1.5  Other Studies Reviewed Today:  Echocardiogram 06/16/2016: Study Conclusions  - Left ventricle: The cavity size was  normal. Wall thickness was increased increased in a pattern of mild to moderate LVH. Systolic function was mildly to moderately reduced. The estimated ejection fraction was in the range of 40% to 45%. Diffuse hypokinesis. Doppler parameters are consistent with abnormal left ventricular relaxation (grade 1 diastolic dysfunction). - Ventricular septum: Septal motion showed abnormal function and dyssynergy. - Aortic valve: Trileaflet; mildly calcified leaflets. - Mitral valve: Calcified annulus. There was trivial regurgitation. - Right ventricle: Pacer wire or catheter noted in right ventricle. - Right atrium: The atrium was mildly dilated. Central venous pressure (est): 3 mm Hg. - Atrial septum: No defect or patent foramen ovale was identified. - Tricuspid valve: There was trivial regurgitation. - Pulmonary arteries: PA peak pressure: 23 mm Hg (S). - Pericardium, extracardiac: There was no pericardial effusion.  Impressions:  - Mild to moderate LVH with LVEF 40-45%. There is diffuse hypokinesis with septal dyssynergy. Grade 1 diastolic dysfunction with intermediate LV filling pressure. MAC with trivial mitral regurgitation. Device wire noted within the right heart. Mildly dilated right atrium. Trivial tricuspid regurgitation with normal estimated PASP 23 mmHg.  Lexiscan Myoview 06/16/2016:  ECG was uninterpretable due to left bundle branch block. Left bundle branch block present throughout Lexiscan infusion.  Small, mild intensity, mid to apical inferoseptal defect is reversible and suggestive of a mild degree of ischemia, although RV pacing or IVCD may also contribute to a defect in this region. No other large ischemic territories are identified.  This is a low risk study.  Nuclear stress EF: 53%.  Assessment and Plan:  1. Chronic fatigue and dyspnea on exertion, multifactorial and overall stable. We are managing him for known cardiomyopathy with LVEF  45-50% range by most recent noninvasive imaging. Plan to continue Toprol-XL and Aldactone. Holding off ARB with low-normal blood pressure.  2. Paroxysmal atrial fibrillation, 10% by most recent device interrogation. He continues on low-dose amiodarone and Coumadin. Last last TSH and LFTs normal. He is due for follow-up lab work with Dr. Margo Commonapper in January.  3. Sick sinus syndrome, St. Jude device in place. Follow-up with Dr. Ladona Ridgelaylor pending this month.  4. Tachycardia-mediated cardiomyopathy. LVEF approximately 45%.   Current medicines were reviewed with the patient today.  Disposition: Follow-up in 8 months, sooner if needed.  Signed, Jonelle SidleSamuel G. McDowell, MD, [facility] 11/08/2016 11:38 AM    [facility] [address], [place], [place] [address] Phone: [phone]; Fax: [phone]

## 2016-11-08 ENCOUNTER — Encounter: Payer: Self-pay | Admitting: Cardiology

## 2016-11-08 ENCOUNTER — Ambulatory Visit (INDEPENDENT_AMBULATORY_CARE_PROVIDER_SITE_OTHER): Payer: Medicare HMO | Admitting: Cardiology

## 2016-11-08 VITALS — BP 104/70 | HR 67 | Ht 70.0 in | Wt 219.0 lb

## 2016-11-08 DIAGNOSIS — I480 Paroxysmal atrial fibrillation: Secondary | ICD-10-CM | POA: Diagnosis not present

## 2016-11-08 DIAGNOSIS — Z950 Presence of cardiac pacemaker: Secondary | ICD-10-CM | POA: Diagnosis not present

## 2016-11-08 DIAGNOSIS — I495 Sick sinus syndrome: Secondary | ICD-10-CM | POA: Diagnosis not present

## 2016-11-08 DIAGNOSIS — I429 Cardiomyopathy, unspecified: Secondary | ICD-10-CM

## 2016-11-08 NOTE — Patient Instructions (Signed)
Your physician wants you to follow-up in: 8 MONTHS WITH DR. MCDOWELL You will receive a reminder letter in the mail two months in advance. If you don't receive a letter, please call our office to schedule the follow-up appointment.  Your physician recommends that you continue on your current medications as directed. Please refer to the Current Medication list given to you today.  Thank you for choosing [facility]!!    

## 2016-11-12 ENCOUNTER — Encounter: Payer: Self-pay | Admitting: Internal Medicine

## 2016-11-12 ENCOUNTER — Ambulatory Visit (INDEPENDENT_AMBULATORY_CARE_PROVIDER_SITE_OTHER): Payer: Medicare HMO | Admitting: Internal Medicine

## 2016-11-12 ENCOUNTER — Encounter: Payer: Self-pay | Admitting: *Deleted

## 2016-11-12 VITALS — BP 136/82 | HR 67 | Ht 70.0 in | Wt 215.0 lb

## 2016-11-12 DIAGNOSIS — Z950 Presence of cardiac pacemaker: Secondary | ICD-10-CM | POA: Diagnosis not present

## 2016-11-12 DIAGNOSIS — I495 Sick sinus syndrome: Secondary | ICD-10-CM | POA: Diagnosis not present

## 2016-11-12 NOTE — Patient Instructions (Signed)
Your physician has recommended that you have a pacemaker inserted. A pacemaker is a small device that is placed under the skin of your chest or abdomen to help control abnormal heart rhythms. This device uses electrical pulses to prompt the heart to beat at a normal rate. Pacemakers are used to treat heart rhythms that are too slow. Wire (leads) are attached to the pacemaker that goes into the chambers of you heart. This is done in the hospital and usually requires and overnight stay. Please see the instruction sheet given to you today for more information.  Your physician recommends that you continue on your current medications as directed. Please refer to the Current Medication list given to you today.  If you need a refill on your cardiac medications before your next appointment, please call your pharmacy.  Thank you for choosing [facility]!     

## 2016-11-12 NOTE — Progress Notes (Signed)
    HPI [name] returns today for followup. He is a pleasant 75 yo man with a h/o sinus node dysfunction, s/p PPM over 15 years ago.  He returns today for followup.  He denies chest pain. He is sedentary. He cares for many cats including over 20 that live with him. He has reached ERI on his PPM. He feels weak. Allergies  Allergen Reactions  . Bee Venom     BEE STINGS  . Codeine     REACTION: muscle cramps  . Corn-Containing Products     Corn bread      Current Outpatient Prescriptions  Medication Sig Dispense Refill  . acetaminophen (TYLENOL) 325 MG tablet Take 650 mg by mouth as needed.    . amiodarone (PACERONE) 200 MG tablet TAKE (1/2) TABLET BY MOUTH DAILY. 15 tablet 6  . Homeopathic Products (ALLERGY MEDICINE PO) Take by mouth as needed.    . metoprolol succinate (TOPROL-XL) 25 MG 24 hr tablet Take 1 tablet (25 mg total) by mouth 2 (two) times daily. 60 tablet 11  . sertraline (ZOLOFT) 50 MG tablet Take 25 mg by mouth daily.     . warfarin (COUMADIN) 2.5 MG tablet TAKE ONE & ONE-HALF TABLETS BY MOUTH ONCE DAILY 45 tablet 4   No current facility-administered medications for this visit.      Past Medical History:  Diagnosis Date  . Asthma   . Bronchitis   . 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 History of epididymitis   . History of recurrent UTIs   . History of ruptured appendix   . Paroxysmal atrial fibrillation (HCC)   . Peptic ulcer disease   . Tachy-brady syndrome (HCC)    PPM (SJM) implanted by Dr Klein 10/19/99, gen change 04/03/08    ROS:   All systems reviewed and negative except as noted in the HPI.   Past Surgical History:  Procedure Laterality Date  . PACEMAKER PLACEMENT  03/2008   St. Jude, [place]. 5826     Family History  Problem Relation Age of Onset  . Suicidality Mother   . Cancer Father      Social History   Social History  . Marital status: Divorced    Spouse name: [name]  . Number of children: [name]  .  Years of education: [name]   Occupational History  . Not on file.   Social History Main Topics  . Smoking status: Former Smoker    Packs/day: 1.50    Years: 20.00    Types: Cigarettes    Start date: 08/20/1959    Quit date: 11/30/1977  . Smokeless tobacco: Never Used  . Alcohol use No  . Drug use: No  . Sexual activity: Not on file   Other Topics Concern  . Not on file   Social History Narrative  . No narrative on file     BP 136/82   Pulse 67   Ht 5' 10" (1.778 m)   Wt 215 lb (97.5 kg)   SpO2 98%   BMI 30.85 kg/m   Physical Exam:  Well appearing 75 yo man, NAD HEENT: Unremarkable Neck:  6 cm JVD, no thyromegally Lymphatics:  No adenopathy Back:  No CVA tenderness Lungs:  Clear with no wheezes HEART:  Regular rate rhythm, no murmurs, no rubs, no clicks Abd:  soft, positive bowel sounds, no organomegally, no rebound, no guarding Ext:  2 plus pulses, no edema, no cyanosis, no clubbing Skin:  No rashes   no nodules Neuro:  CN II through XII intact, motor grossly intact   DEVICE  Normal device function.  See PaceArt for details.   Assess/Plan: 1. Sinus node dysfunction - he is s/p PPM and will undergo gen change. 2. HTN - his blood pressure is well controlled. 3. PAF - he will continue his anti-coagulation and low dose amiodarone. 4. PPM - his St. Jude device is at [facility]. We discussed PM gen change. Will schedule in the next few days.  Leonia Reeves.D.

## 2016-11-17 ENCOUNTER — Encounter (HOSPITAL_COMMUNITY)
Admission: RE | Disposition: A | Discharge status: Home / Self Care | Payer: Self-pay | Source: Ambulatory Visit | Attending: Internal Medicine

## 2016-11-17 ENCOUNTER — Ambulatory Visit (HOSPITAL_COMMUNITY)
Admission: RE | Admit: 2016-11-17 | Discharge: 2016-11-17 | Disposition: A | Discharge status: Home / Self Care | Payer: Medicare HMO | Source: Ambulatory Visit | Attending: Internal Medicine | Admitting: Internal Medicine

## 2016-11-17 DIAGNOSIS — Z87891 Personal history of nicotine dependence: Secondary | ICD-10-CM | POA: Insufficient documentation

## 2016-11-17 DIAGNOSIS — Z8711 Personal history of peptic ulcer disease: Secondary | ICD-10-CM | POA: Diagnosis not present

## 2016-11-17 DIAGNOSIS — I10 Essential (primary) hypertension: Secondary | ICD-10-CM | POA: Diagnosis not present

## 2016-11-17 DIAGNOSIS — I495 Sick sinus syndrome: Secondary | ICD-10-CM | POA: Diagnosis present

## 2016-11-17 DIAGNOSIS — Z7901 Long term (current) use of anticoagulants: Secondary | ICD-10-CM | POA: Insufficient documentation

## 2016-11-17 DIAGNOSIS — Z87440 Personal history of urinary (tract) infections: Secondary | ICD-10-CM | POA: Insufficient documentation

## 2016-11-17 DIAGNOSIS — Z950 Presence of cardiac pacemaker: Secondary | ICD-10-CM | POA: Diagnosis present

## 2016-11-17 DIAGNOSIS — I429 Cardiomyopathy, unspecified: Secondary | ICD-10-CM | POA: Diagnosis not present

## 2016-11-17 DIAGNOSIS — J45909 Unspecified asthma, uncomplicated: Secondary | ICD-10-CM | POA: Diagnosis not present

## 2016-11-17 DIAGNOSIS — I480 Paroxysmal atrial fibrillation: Secondary | ICD-10-CM | POA: Diagnosis not present

## 2016-11-17 DIAGNOSIS — Z45010 Encounter for checking and testing of cardiac pacemaker pulse generator [battery]: Secondary | ICD-10-CM | POA: Insufficient documentation

## 2016-11-17 HISTORY — PX: EP IMPLANTABLE DEVICE: SHX172B

## 2016-11-17 LAB — BASIC METABOLIC PANEL
Anion gap: 6 (ref 5–15)
BUN: 18 mg/dL (ref 6–20)
CALCIUM: 9.4 mg/dL (ref 8.9–10.3)
CO2: 25 mmol/L (ref 22–32)
CREATININE: 1.09 mg/dL (ref 0.61–1.24)
Chloride: 110 mmol/L (ref 101–111)
GFR calc Af Amer: >60 mL/min (ref >60)
GFR calc non Af Amer: >60 mL/min (ref >60)
GLUCOSE: 98 mg/dL (ref 65–99)
Potassium: 3.9 mmol/L (ref 3.5–5.1)
Sodium: 141 mmol/L (ref 135–145)

## 2016-11-17 LAB — PROTIME-INR
INR: 2.15
Prothrombin Time: 24.3 seconds — ABNORMAL HIGH (ref 11.4–15.2)

## 2016-11-17 LAB — CBC
HEMATOCRIT: 45.2 % (ref 39.0–52.0)
Hemoglobin: 15.3 g/dL (ref 13.0–17.0)
MCH: 30.4 pg (ref 26.0–34.0)
MCHC: 33.8 g/dL (ref 30.0–36.0)
MCV: 89.9 fL (ref 78.0–100.0)
PLATELETS: 169 10*3/uL (ref 150–400)
RBC: 5.03 MIL/uL (ref 4.22–5.81)
RDW: 15 % (ref 11.5–15.5)
WBC: 7.4 10*3/uL (ref 4.0–10.5)

## 2016-11-17 LAB — SURGICAL PCR SCREEN
MRSA, PCR: NEGATIVE
Staphylococcus aureus: NEGATIVE

## 2016-11-17 SURGERY — PPM GENERATOR CHANGEOUT
Anesthesia: LOCAL

## 2016-11-17 MED ORDER — ONDANSETRON HCL 4 MG/2ML IJ SOLN: 4.0000 mg | Freq: Four times a day (QID) | INTRAMUSCULAR | Status: DC | PRN

## 2016-11-17 MED ORDER — MUPIROCIN 2 % EX OINT
TOPICAL_OINTMENT | CUTANEOUS | Status: AC
  Filled 2016-11-17: qty 22

## 2016-11-17 MED ORDER — CHLORHEXIDINE GLUCONATE 4 % EX LIQD
60.0000 mL | Freq: Once | CUTANEOUS | Status: DC
  Filled 2016-11-17: qty 60

## 2016-11-17 MED ORDER — MUPIROCIN 2 % EX OINT
TOPICAL_OINTMENT | Freq: Once | CUTANEOUS | Status: AC
  Administered 2016-11-17: 1 via NASAL
  Filled 2016-11-17: qty 22

## 2016-11-17 MED ORDER — CEFAZOLIN SODIUM-DEXTROSE 2-4 GM/100ML-% IV SOLN
INTRAVENOUS | Status: AC
  Filled 2016-11-17: qty 100

## 2016-11-17 MED ORDER — SODIUM CHLORIDE 0.9 % IV SOLN
INTRAVENOUS | Status: DC
  Administered 2016-11-17: 10:00:00 via INTRAVENOUS

## 2016-11-17 MED ORDER — LIDOCAINE HCL (PF) 1 % IJ SOLN
INTRAMUSCULAR | Status: DC | PRN
  Administered 2016-11-17: 45 mL

## 2016-11-17 MED ORDER — LIDOCAINE HCL (PF) 1 % IJ SOLN
INTRAMUSCULAR | Status: AC
  Filled 2016-11-17: qty 60

## 2016-11-17 MED ORDER — VANCOMYCIN HCL IN DEXTROSE 1-5 GM/200ML-% IV SOLN: 1000.0000 mg | INTRAVENOUS | Status: DC

## 2016-11-17 MED ORDER — GENTAMICIN SULFATE 40 MG/ML IJ SOLN
80.0000 mg | INTRAMUSCULAR | Status: AC
  Administered 2016-11-17: 80 mg

## 2016-11-17 MED ORDER — SODIUM CHLORIDE 0.9 % IR SOLN
Status: AC
  Filled 2016-11-17: qty 2

## 2016-11-17 MED ORDER — ACETAMINOPHEN 325 MG PO TABS
325.0000 mg | ORAL_TABLET | ORAL | Status: DC | PRN
  Filled 2016-11-17: qty 2

## 2016-11-17 MED ORDER — VANCOMYCIN HCL 1000 MG IV SOLR
INTRAVENOUS | Status: DC
  Administered 2016-11-17: 11:00:00 via INTRAVENOUS
  Filled 2016-11-17 (×19): qty 250

## 2016-11-17 SURGICAL SUPPLY — 4 items
CABLE SURGICAL S-101-97-12 (CABLE) ×2 IMPLANT
PACEMAKER ASSURITY DR-RF (Pacemaker) ×2 IMPLANT
PAD DEFIB LIFELINK (PAD) ×2 IMPLANT
TRAY PACEMAKER INSERTION (PACKS) ×2 IMPLANT

## 2016-11-17 NOTE — H&P (View-Only) (Signed)
HPI [name] returns today for followup. He is a pleasant 75 yo man with a h/o sinus node dysfunction, s/p PPM over 15 years ago.  He returns today for followup.  He denies chest pain. He is sedentary. He cares for many cats including over 20 that live with him. He has reached ERI on his PPM. He feels weak. Allergies  Allergen Reactions  . Bee Venom     BEE STINGS  . Codeine     REACTION: muscle cramps  . Corn-Containing Products     Corn bread      Current Outpatient Prescriptions  Medication Sig Dispense Refill  . acetaminophen (TYLENOL) 325 MG tablet Take 650 mg by mouth as needed.    Marland Kitchen. amiodarone (PACERONE) 200 MG tablet TAKE (1/2) TABLET BY MOUTH DAILY. 15 tablet 6  . Homeopathic Products (ALLERGY MEDICINE PO) Take by mouth as needed.    . metoprolol succinate (TOPROL-XL) 25 MG 24 hr tablet Take 1 tablet (25 mg total) by mouth 2 (two) times daily. 60 tablet 11  . sertraline (ZOLOFT) 50 MG tablet Take 25 mg by mouth daily.     Marland Kitchen. warfarin (COUMADIN) 2.5 MG tablet TAKE ONE & ONE-HALF TABLETS BY MOUTH ONCE DAILY 45 tablet 4   No current facility-administered medications for this visit.      Past Medical History:  Diagnosis Date  . Asthma   . Bronchitis   . 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 History of epididymitis   . History of recurrent UTIs   . History of ruptured appendix   . Paroxysmal atrial fibrillation (HCC)   . Peptic ulcer disease   . Tachy-brady syndrome (HCC)    PPM (SJM) implanted by Dr Graciela HusbandsKlein 10/19/99, gen change 04/03/08    ROS:   All systems reviewed and negative except as noted in the HPI.   Past Surgical History:  Procedure Laterality Date  . PACEMAKER PLACEMENT  03/2008   St. Jude, [place]. [id number]     Family History  Problem Relation Age of Onset  . Suicidality Mother   . Cancer Father      Social History   Social History  . Marital status: Divorced    Spouse name: [name]  . Number of children: [name]  .  Years of education: [name]   Occupational History  . Not on file.   Social History Main Topics  . Smoking status: Former Smoker    Packs/day: 1.50    Years: 20.00    Types: Cigarettes    Start date: 08/20/1959    Quit date: 11/30/1977  . Smokeless tobacco: Never Used  . Alcohol use No  . Drug use: No  . Sexual activity: Not on file   Other Topics Concern  . Not on file   Social History Narrative  . No narrative on file     BP 136/82   Pulse 67   Ht 5\' 10"  (1.778 m)   Wt 215 lb (97.5 kg)   SpO2 98%   BMI 30.85 kg/m   Physical Exam:  Well appearing 75 yo man, NAD HEENT: Unremarkable Neck:  6 cm JVD, no thyromegally Lymphatics:  No adenopathy Back:  No CVA tenderness Lungs:  Clear with no wheezes HEART:  Regular rate rhythm, no murmurs, no rubs, no clicks Abd:  soft, positive bowel sounds, no organomegally, no rebound, no guarding Ext:  2 plus pulses, no edema, no cyanosis, no clubbing Skin:  No rashes  no nodules Neuro:  CN II through XII intact, motor grossly intact   DEVICE  Normal device function.  See PaceArt for details.   Assess/Plan: 1. Sinus node dysfunction - he is s/p PPM and will undergo gen change. 2. HTN - his blood pressure is well controlled. 3. PAF - he will continue his anti-coagulation and low dose amiodarone. 4. PPM - his St. Jude device is at [facility]. We discussed PM gen change. Will schedule in the next few days.  Leonia Reeves.D.

## 2016-11-17 NOTE — Interval H&P Note (Signed)
History and Physical Interval Note:  11/17/2016 8:58 AM  [name]  has presented today for surgery, with the diagnosis of ERI  The various methods of treatment have been discussed with the patient and family. After consideration of risks, benefits and other options for treatment, the patient has consented to  Procedure(s): PPM Generator Changeout ([name]) as a surgical intervention .  The patient's history has been reviewed, patient examined, no change in status, stable for surgery.  I have reviewed the patient's chart and labs.  Questions were answered to the patient's satisfaction.     Lewayne Bunting

## 2016-11-17 NOTE — Discharge Instructions (Signed)
Pacemaker Battery Change, Care After °Refer to this sheet in the next few weeks. These instructions provide you with information on caring for yourself after your procedure. Your health care provider may also give you more specific instructions. Your treatment has been planned according to current medical practices, but problems sometimes occur. Call your health care provider if you have any problems or questions after your procedure. °WHAT TO EXPECT AFTER THE PROCEDURE °After your procedure, it is typical to have the following sensations: °· Soreness at the pacemaker site. °HOME CARE INSTRUCTIONS  °· Keep the incision clean and dry. °· Unless advised otherwise, you may shower beginning 48 hours after your procedure. °· For the first week after the replacement, avoid stretching motions that pull at the incision site, and avoid heavy exercise with the arm that is on the same side as the incision. °· Take medicines only as directed by your health care provider. °· Keep all follow-up visits as directed by your health care provider. °SEEK MEDICAL CARE IF:  °· You have pain at the incision site that is not relieved by over-the-counter or prescription medicine. °· There is drainage or pus from the incision site. °· There is swelling larger than a lime at the incision site. °· You develop red streaking that extends above or below the incision site. °· You feel brief, intermittent palpitations, light-headedness, or any symptoms that you feel might be related to your heart. °SEEK IMMEDIATE MEDICAL CARE IF:  °· You experience chest pain that is different than the pain at the pacemaker site. °· You experience shortness of breath. °· You have palpitations or irregular heartbeat. °· You have light-headedness that does not go away quickly. °· You faint. °· You have pain that gets worse and is not relieved by medicine. °This information is not intended to replace advice given to you by your health care provider. Make sure you  discuss any questions you have with your health care provider. °Document Released: 09/05/2013 Document Revised: 12/06/2014 Document Reviewed: 09/05/2013 °Elsevier Interactive Patient Education © 2017 Elsevier Inc. ° °

## 2016-11-18 ENCOUNTER — Encounter (HOSPITAL_COMMUNITY): Payer: Self-pay | Admitting: Internal Medicine

## 2016-11-26 ENCOUNTER — Ambulatory Visit: Payer: Medicare HMO

## 2016-11-30 ENCOUNTER — Encounter: Payer: Self-pay | Admitting: Internal Medicine

## 2016-12-07 ENCOUNTER — Ambulatory Visit (INDEPENDENT_AMBULATORY_CARE_PROVIDER_SITE_OTHER): Payer: Medicare HMO | Admitting: *Deleted

## 2016-12-07 DIAGNOSIS — I4891 Unspecified atrial fibrillation: Secondary | ICD-10-CM | POA: Diagnosis not present

## 2016-12-07 DIAGNOSIS — Z5181 Encounter for therapeutic drug level monitoring: Secondary | ICD-10-CM

## 2016-12-07 DIAGNOSIS — I480 Paroxysmal atrial fibrillation: Secondary | ICD-10-CM | POA: Diagnosis not present

## 2016-12-07 LAB — POCT INR: INR: 3.2

## 2016-12-15 ENCOUNTER — Ambulatory Visit: Payer: Medicare HMO

## 2016-12-21 DIAGNOSIS — Z950 Presence of cardiac pacemaker: Secondary | ICD-10-CM | POA: Diagnosis not present

## 2016-12-21 DIAGNOSIS — Z0000 Encounter for general adult medical examination without abnormal findings: Secondary | ICD-10-CM | POA: Diagnosis not present

## 2016-12-21 DIAGNOSIS — I480 Paroxysmal atrial fibrillation: Secondary | ICD-10-CM | POA: Diagnosis not present

## 2016-12-21 DIAGNOSIS — Z5181 Encounter for therapeutic drug level monitoring: Secondary | ICD-10-CM | POA: Diagnosis not present

## 2016-12-21 DIAGNOSIS — Z79899 Other long term (current) drug therapy: Secondary | ICD-10-CM | POA: Diagnosis not present

## 2016-12-21 DIAGNOSIS — Z6831 Body mass index (BMI) 31.0-31.9, adult: Secondary | ICD-10-CM | POA: Diagnosis not present

## 2016-12-21 DIAGNOSIS — Z23 Encounter for immunization: Secondary | ICD-10-CM | POA: Diagnosis not present

## 2016-12-21 DIAGNOSIS — R69 Illness, unspecified: Secondary | ICD-10-CM | POA: Diagnosis not present

## 2016-12-28 ENCOUNTER — Ambulatory Visit (INDEPENDENT_AMBULATORY_CARE_PROVIDER_SITE_OTHER): Payer: Medicare HMO | Admitting: *Deleted

## 2016-12-28 DIAGNOSIS — I4891 Unspecified atrial fibrillation: Secondary | ICD-10-CM

## 2016-12-28 DIAGNOSIS — Z5181 Encounter for therapeutic drug level monitoring: Secondary | ICD-10-CM | POA: Diagnosis not present

## 2016-12-28 DIAGNOSIS — I480 Paroxysmal atrial fibrillation: Secondary | ICD-10-CM

## 2016-12-28 LAB — POCT INR: INR: 3.3

## 2016-12-30 ENCOUNTER — Ambulatory Visit (INDEPENDENT_AMBULATORY_CARE_PROVIDER_SITE_OTHER): Payer: Medicare HMO | Admitting: *Deleted

## 2016-12-30 DIAGNOSIS — I495 Sick sinus syndrome: Secondary | ICD-10-CM

## 2016-12-30 DIAGNOSIS — Z950 Presence of cardiac pacemaker: Secondary | ICD-10-CM

## 2016-12-30 LAB — CUP PACEART INCLINIC DEVICE CHECK
Battery Voltage: 3.04 V
Brady Statistic RA Percent Paced: 71 %
Brady Statistic RV Percent Paced: 2.4 %
Date Time Interrogation Session: 20180201105342
Implantable Lead Implant Date: 20001120
Implantable Lead Location: 753859
Implantable Pulse Generator Implant Date: 20171220
Lead Channel Pacing Threshold Amplitude: 0.75 V
Lead Channel Pacing Threshold Amplitude: 0.75 V
Lead Channel Pacing Threshold Pulse Width: 0.5 ms
Lead Channel Pacing Threshold Pulse Width: 0.5 ms
Lead Channel Pacing Threshold Pulse Width: 0.5 ms
MDC IDC LEAD IMPLANT DT: 20001120
MDC IDC LEAD LOCATION: 753860
MDC IDC MSMT LEADCHNL RA IMPEDANCE VALUE: 437.5 Ohm
MDC IDC MSMT LEADCHNL RA SENSING INTR AMPL: 5 mV
MDC IDC MSMT LEADCHNL RV IMPEDANCE VALUE: 287.5 Ohm
MDC IDC MSMT LEADCHNL RV PACING THRESHOLD AMPLITUDE: 1 V
MDC IDC MSMT LEADCHNL RV PACING THRESHOLD AMPLITUDE: 1 V
MDC IDC MSMT LEADCHNL RV PACING THRESHOLD PULSEWIDTH: 0.5 ms
MDC IDC MSMT LEADCHNL RV SENSING INTR AMPL: 8.1 mV
MDC IDC SET LEADCHNL RA PACING AMPLITUDE: 2.5 V
MDC IDC SET LEADCHNL RV PACING AMPLITUDE: 2.5 V
MDC IDC SET LEADCHNL RV PACING PULSEWIDTH: 0.5 ms
MDC IDC SET LEADCHNL RV SENSING SENSITIVITY: 2 mV
Pulse Gen Model: 2272
Pulse Gen Serial Number: 7983565

## 2016-12-30 NOTE — Progress Notes (Signed)
Wound check appointment s/p generator replacement 11/17/16. No steri-strips in place. Wound without redness or edema. Incision edges approximated, wound well healed. Normal device function. Thresholds, sensing, and impedances consistent with implant measurements. Histogram distribution appropriate for patient and level of activity. 8.7% AT/AF- on warfarin. 2 high ventricular rates noted- AF RVR. Patient educated about wound care, arm mobility, lifting restrictions. ROV with GT in RDS 03/10/17.

## 2017-01-08 ENCOUNTER — Other Ambulatory Visit: Payer: Self-pay | Admitting: Cardiology

## 2017-01-14 DIAGNOSIS — R3915 Urgency of urination: Secondary | ICD-10-CM | POA: Diagnosis not present

## 2017-01-14 DIAGNOSIS — R2242 Localized swelling, mass and lump, left lower limb: Secondary | ICD-10-CM | POA: Diagnosis not present

## 2017-01-18 ENCOUNTER — Ambulatory Visit (INDEPENDENT_AMBULATORY_CARE_PROVIDER_SITE_OTHER): Payer: Medicare HMO | Admitting: *Deleted

## 2017-01-18 DIAGNOSIS — I480 Paroxysmal atrial fibrillation: Secondary | ICD-10-CM

## 2017-01-18 DIAGNOSIS — I4891 Unspecified atrial fibrillation: Secondary | ICD-10-CM

## 2017-01-18 DIAGNOSIS — Z5181 Encounter for therapeutic drug level monitoring: Secondary | ICD-10-CM

## 2017-01-18 LAB — POCT INR: INR: 2.8

## 2017-02-15 ENCOUNTER — Ambulatory Visit (INDEPENDENT_AMBULATORY_CARE_PROVIDER_SITE_OTHER): Payer: Medicare HMO | Admitting: *Deleted

## 2017-02-15 ENCOUNTER — Encounter: Payer: Medicare HMO | Admitting: Internal Medicine

## 2017-02-15 DIAGNOSIS — I480 Paroxysmal atrial fibrillation: Secondary | ICD-10-CM

## 2017-02-15 DIAGNOSIS — J180 Bronchopneumonia, unspecified organism: Secondary | ICD-10-CM | POA: Diagnosis not present

## 2017-02-15 DIAGNOSIS — Z5181 Encounter for therapeutic drug level monitoring: Secondary | ICD-10-CM

## 2017-02-15 DIAGNOSIS — I4891 Unspecified atrial fibrillation: Secondary | ICD-10-CM | POA: Diagnosis not present

## 2017-02-15 LAB — POCT INR: INR: 2.5

## 2017-03-09 ENCOUNTER — Other Ambulatory Visit: Payer: Self-pay | Admitting: Cardiology

## 2017-03-10 ENCOUNTER — Encounter: Payer: Medicare HMO | Admitting: Internal Medicine

## 2017-03-17 ENCOUNTER — Ambulatory Visit (INDEPENDENT_AMBULATORY_CARE_PROVIDER_SITE_OTHER): Payer: Medicare HMO | Admitting: *Deleted

## 2017-03-17 DIAGNOSIS — I480 Paroxysmal atrial fibrillation: Secondary | ICD-10-CM | POA: Diagnosis not present

## 2017-03-17 DIAGNOSIS — Z5181 Encounter for therapeutic drug level monitoring: Secondary | ICD-10-CM

## 2017-03-17 DIAGNOSIS — I4891 Unspecified atrial fibrillation: Secondary | ICD-10-CM

## 2017-03-17 LAB — POCT INR: INR: 2.2

## 2017-03-24 ENCOUNTER — Ambulatory Visit (INDEPENDENT_AMBULATORY_CARE_PROVIDER_SITE_OTHER): Payer: Medicare HMO | Admitting: *Deleted

## 2017-03-24 DIAGNOSIS — I480 Paroxysmal atrial fibrillation: Secondary | ICD-10-CM | POA: Diagnosis not present

## 2017-03-24 DIAGNOSIS — I4891 Unspecified atrial fibrillation: Secondary | ICD-10-CM

## 2017-03-24 DIAGNOSIS — Z5181 Encounter for therapeutic drug level monitoring: Secondary | ICD-10-CM | POA: Diagnosis not present

## 2017-03-24 LAB — POCT INR: INR: 2.1

## 2017-04-14 ENCOUNTER — Ambulatory Visit (INDEPENDENT_AMBULATORY_CARE_PROVIDER_SITE_OTHER): Payer: Medicare HMO | Admitting: *Deleted

## 2017-04-14 DIAGNOSIS — I4891 Unspecified atrial fibrillation: Secondary | ICD-10-CM | POA: Diagnosis not present

## 2017-04-14 DIAGNOSIS — Z5181 Encounter for therapeutic drug level monitoring: Secondary | ICD-10-CM | POA: Diagnosis not present

## 2017-04-14 DIAGNOSIS — I480 Paroxysmal atrial fibrillation: Secondary | ICD-10-CM

## 2017-04-14 LAB — POCT INR: INR: 2

## 2017-06-28 ENCOUNTER — Ambulatory Visit (INDEPENDENT_AMBULATORY_CARE_PROVIDER_SITE_OTHER): Payer: Medicare HMO | Admitting: *Deleted

## 2017-06-28 DIAGNOSIS — I480 Paroxysmal atrial fibrillation: Secondary | ICD-10-CM

## 2017-06-28 DIAGNOSIS — Z5181 Encounter for therapeutic drug level monitoring: Secondary | ICD-10-CM | POA: Diagnosis not present

## 2017-06-28 LAB — POCT INR: INR: 2.3

## 2017-07-14 ENCOUNTER — Ambulatory Visit: Payer: Medicare HMO | Admitting: Cardiology

## 2017-07-14 ENCOUNTER — Encounter: Payer: Self-pay | Admitting: Cardiology

## 2017-07-14 NOTE — Progress Notes (Deleted)
Cardiology Office Note  Date: 07/14/2017   ID: [name], DOB 1941-09-17, MRN [id number]  PCP: Deloria Lair., MD  Primary Cardiologist: Rozann Lesches, MD   No chief complaint on file.   History of Present Illness: [name] is a 76 y.o. male last seen in December 2017.  He continues to follow in the anticoagulation clinic on Coumadin.  He follows with Dr. Lovena Le in the device clinic, [place] pacemaker in place. He underwent a generator change in December 2017.  I reviewed his follow-up lab work from January showing normal TSH and LFTs on amiodarone.  Past Medical History:  Diagnosis Date  . Asthma   . Bronchitis   . Cardiomyopathy ([place])    Possibly tachycardia mediated  . History of epididymitis   . History of recurrent UTIs   . History of ruptured appendix   . Paroxysmal atrial fibrillation (HCC)   . Peptic ulcer disease   . Tachy-brady syndrome ([place])    PPM (SJM) implanted by Dr Caryl Comes 10/19/99, gen change 04/03/08    Past Surgical History:  Procedure Laterality Date  . EP IMPLANTABLE DEVICE [name] 11/17/2016   Procedure: PPM Generator Changeout;  Surgeon: Evans Lance, MD;  Location: [facility];  Service: Cardiovascular;  Laterality: [name];  . PACEMAKER PLACEMENT  03/2008   St. Jude, [place]. 5826    Current Outpatient Prescriptions  Medication Sig Dispense Refill  . acetaminophen (TYLENOL) 325 MG tablet Take 650 mg by mouth as needed (back pain).     Marland Kitchen amiodarone (PACERONE) 200 MG tablet TAKE ONE-HALF TABLET BY MOUTH ONCE DAILY 15 tablet 6  . calcium carbonate (TUMS - DOSED IN MG ELEMENTAL CALCIUM) 500 MG chewable tablet Chew 2 tablets by mouth daily as needed for indigestion or heartburn.    . Homeopathic Products (ALLERGY MEDICINE PO) Take 1 tablet by mouth as needed (allergies).     . metoprolol tartrate (LOPRESSOR) 25 MG tablet Take 25 mg by mouth 2 (two) times daily.    . sertraline (ZOLOFT) 50 MG tablet Take 50 mg by mouth daily.       Marland Kitchen warfarin (COUMADIN) 2.5 MG tablet Take 1 1/2 tablets daily except 1 tablet on Tuesdays and Fridays 135 tablet 3   No current facility-administered medications for this visit.    Allergies:  Bee venom; Corn-containing products; and Codeine   Social History: The patient  reports that he quit smoking about 39 years ago. His smoking use included Cigarettes. He started smoking about 57 years ago. He has a 30.00 pack-year smoking history. He has never used smokeless tobacco. He reports that he does not drink alcohol or use drugs.   Family History: The patient's family history includes Cancer in his father; Suicidality in his mother.   ROS:  Please see the history of present illness. Otherwise, complete review of systems is positive for {NONE DEFAULTED:18576::"none"}.  All other systems are reviewed and negative.   Physical Exam: VS:  There were no vitals taken for this visit., BMI There is no height or weight on file to calculate BMI.  Wt Readings from Last 3 Encounters:  11/17/16 215 lb (97.5 kg)  11/12/16 215 lb (97.5 kg)  11/08/16 219 lb (99.3 kg)    Overweight male, appears comfortable at rest. HEENT: Conjunctiva and lids normal, oropharynx clear.  Neck: Supple, no elevated JVP or carotid bruits, no thyromegaly.  Lungs: Diminished but clear to auscultation, nonlabored breathing at rest.  Cardiac: Indistinct PMI, Regular  rate and rhythm, no S3, soft apical systolic murmur.  Abdomen: Soft, nontender, protuberant, bowel sounds present.  Extremities: No pitting edema, distal pulses 2+.  Skin: Warm and dry. Musculoskeletal: No kyphosis. Neuropsychiatric: Alert and oriented 3, affect appropriate.  ECG: I personally reviewed the tracing from 04/01/2016 which showed sinus rhythm with left bundle branch block.  Recent Labwork: 11/17/2016: BUN 18; Creatinine, Ser 1.09; Hemoglobin 15.3; Platelets 169; Potassium 3.9; Sodium 141  January 2018: TSH 1.51, BUN 16, creatinine 0.92, AST 21,  ALT 20, potassium 4.1  Other Studies Reviewed Today:  Echocardiogram 06/16/2016: Study Conclusions  - Left ventricle: The cavity size was normal. Wall thickness was   increased increased in a pattern of mild to moderate LVH.   Systolic function was mildly to moderately reduced. The estimated   ejection fraction was in the range of 40% to 45%. Diffuse   hypokinesis. Doppler parameters are consistent with abnormal left   ventricular relaxation (grade 1 diastolic dysfunction). - Ventricular septum: Septal motion showed abnormal function and   dyssynergy. - Aortic valve: Trileaflet; mildly calcified leaflets. - Mitral valve: Calcified annulus. There was trivial regurgitation. - Right ventricle: Pacer wire or catheter noted in right ventricle. - Right atrium: The atrium was mildly dilated. Central venous   pressure (est): 3 mm Hg. - Atrial septum: No defect or patent foramen ovale was identified. - Tricuspid valve: There was trivial regurgitation. - Pulmonary arteries: PA peak pressure: 23 mm Hg (S). - Pericardium, extracardiac: There was no pericardial effusion.  Impressions:  - Mild to moderate LVH with LVEF 40-45%. There is diffuse   hypokinesis with septal dyssynergy. Grade 1 diastolic dysfunction   with intermediate LV filling pressure. MAC with trivial mitral   regurgitation. Device wire noted within the right heart. Mildly   dilated right atrium. Trivial tricuspid regurgitation with normal   estimated PASP 23 mmHg.  Assessment and Plan:    Current medicines were reviewed with the patient today.  No orders of the defined types were placed in this encounter.   Disposition:  Signed, Satira Sark, MD, [facility] 07/14/2017 8:31 AM    [facility] [place], [place], [place] [address] Phone: [phone]; Fax: [phone]

## 2017-08-01 ENCOUNTER — Other Ambulatory Visit: Payer: Self-pay | Admitting: Cardiology

## 2017-08-30 ENCOUNTER — Ambulatory Visit (INDEPENDENT_AMBULATORY_CARE_PROVIDER_SITE_OTHER): Payer: Medicare HMO | Admitting: *Deleted

## 2017-08-30 DIAGNOSIS — Z5181 Encounter for therapeutic drug level monitoring: Secondary | ICD-10-CM | POA: Diagnosis not present

## 2017-08-30 DIAGNOSIS — I480 Paroxysmal atrial fibrillation: Secondary | ICD-10-CM | POA: Diagnosis not present

## 2017-08-30 LAB — POCT INR: INR: 2.4

## 2017-09-06 ENCOUNTER — Ambulatory Visit (INDEPENDENT_AMBULATORY_CARE_PROVIDER_SITE_OTHER): Payer: Medicare HMO | Admitting: Cardiology

## 2017-09-06 ENCOUNTER — Encounter: Payer: Self-pay | Admitting: Cardiology

## 2017-09-06 VITALS — BP 104/70 | HR 85 | Ht 70.0 in | Wt 219.0 lb

## 2017-09-06 DIAGNOSIS — Z950 Presence of cardiac pacemaker: Secondary | ICD-10-CM | POA: Diagnosis not present

## 2017-09-06 DIAGNOSIS — I447 Left bundle-branch block, unspecified: Secondary | ICD-10-CM | POA: Diagnosis not present

## 2017-09-06 DIAGNOSIS — I480 Paroxysmal atrial fibrillation: Secondary | ICD-10-CM

## 2017-09-06 DIAGNOSIS — I495 Sick sinus syndrome: Secondary | ICD-10-CM

## 2017-09-06 DIAGNOSIS — I429 Cardiomyopathy, unspecified: Secondary | ICD-10-CM | POA: Diagnosis not present

## 2017-09-06 NOTE — Patient Instructions (Signed)

## 2017-09-06 NOTE — Progress Notes (Signed)
Cardiology Office Note  Date: 09/06/2017   ID: BARTLEY VUOLO, DOB [birth year], MRN [id number]  PCP: Deloria Lair., MD  Primary Cardiologist: Rozann Lesches, MD   Chief Complaint  Patient presents with  . PAF    History of Present Illness: [name] is a 76 y.o. male last seen in December 2017. He presents today for a routine follow-up visit. He reports stable dyspnea on exertion, no chest pain or palpitations. He has had no dizziness or syncope.  He continues to follow in the anticoagulation clinic on Coumadin. He does not indicate any spontaneous bleeding problems. Recent INR 2.4.  He follows in the device clinic with Dr. Lovena Le, [place] pacemaker reached ERI in the interim and he underwent generator change. Last device check indicated 8.7% AT/AF.  I personally reviewed his ECG today which shows sinus rhythm with left bundle branch block.  Past Medical History:  Diagnosis Date  . Asthma   . Bronchitis   . 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 History of epididymitis   . History of recurrent UTIs   . History of ruptured appendix   . Paroxysmal atrial fibrillation (HCC)   . Peptic ulcer disease   . Tachy-brady syndrome ([place])    PPM (SJM) implanted by Dr Caryl Comes 10/19/99, gen change 04/03/08    Past Surgical History:  Procedure Laterality Date  . EP IMPLANTABLE DEVICE [name] 11/17/2016   Procedure: PPM Generator Changeout;  Surgeon: Evans Lance, MD;  Location: [facility];  Service: Cardiovascular;  Laterality: [name];  . PACEMAKER PLACEMENT  03/2008   [employer]. Jude, [place]. 5826    Current Outpatient Prescriptions  Medication Sig Dispense Refill  . acetaminophen (TYLENOL) 325 MG tablet Take 650 mg by mouth as needed (back pain).     Marland Kitchen amiodarone (PACERONE) 200 MG tablet TAKE 1/2 (ONE-HALF) TABLET BY MOUTH ONCE DAILY 30 tablet 0  . calcium carbonate (TUMS - DOSED IN MG ELEMENTAL CALCIUM) 500 MG chewable tablet Chew 2 tablets by mouth daily  as needed for indigestion or heartburn.    . Homeopathic Products (ALLERGY MEDICINE PO) Take 1 tablet by mouth as needed (allergies).     . metoprolol tartrate (LOPRESSOR) 25 MG tablet Take 25 mg by mouth 2 (two) times daily.    . sertraline (ZOLOFT) 50 MG tablet Take 50 mg by mouth daily.     Marland Kitchen warfarin (COUMADIN) 2.5 MG tablet Take 1 1/2 tablets daily except 1 tablet on Tuesdays and Fridays (Patient taking differently: Take 1 1/2 tablets daily except 1 tablet on Tuesdays Thursday Saturday) 135 tablet 3   No current facility-administered medications for this visit.    Allergies:  Bee venom; Corn-containing products; and Codeine   Social History: The patient  reports that he quit smoking about 39 years ago. His smoking use included Cigarettes. He started smoking about 58 years ago. He has a 30.00 pack-year smoking history. He has never used smokeless tobacco. He reports that he does not drink alcohol or use drugs.   ROS:  Please see the history of present illness. Otherwise, complete review of systems is positive for hearing loss.  All other systems are reviewed and negative.   Physical Exam: VS:  BP 104/70   Pulse 85   Ht _0  (1.778 m)   Wt 219 lb (99.3 kg)   SpO2 98%   BMI 31.42 kg/m , BMI Body mass index is 31.42 kg/m.  Wt Readings from Last  3 Encounters:  09/06/17 219 lb (99.3 kg)  11/17/16 215 lb (97.5 kg)  11/12/16 215 lb (97.5 kg)    General: Obese elderly male, appears comfortable at rest. HEENT: Conjunctiva and lids normal, oropharynx clear. Neck: Supple, no elevated JVP or carotid bruits, no thyromegaly. Lungs: Clear to auscultation, nonlabored breathing at rest. Cardiac: Regular rate and rhythm, no S3, soft systolic murmur, no pericardial rub. Abdomen: Protuberant, nontender, bowel sounds present. Extremities: No pitting edema, distal pulses 2+. Skin: Warm and dry. Musculoskeletal: No kyphosis. Neuropsychiatric: Alert and oriented x3, affect grossly  appropriate.  ECG: I personally reviewed the tracing from 04/01/2016 which showed sinus rhythm with left bundle branch block.  Recent Labwork: 11/17/2016: BUN 18; Creatinine, Ser 1.09; Hemoglobin 15.3; Platelets 169; Potassium 3.9; Sodium 141   Other Studies Reviewed Today:  Echocardiogram 06/16/2016: Study Conclusions  - Left ventricle: The cavity size was normal. Wall thickness was   increased increased in a pattern of mild to moderate LVH.   Systolic function was mildly to moderately reduced. The estimated   ejection fraction was in the range of 40% to 45%. Diffuse   hypokinesis. Doppler parameters are consistent with abnormal left   ventricular relaxation (grade 1 diastolic dysfunction). - Ventricular septum: Septal motion showed abnormal function and   dyssynergy. - Aortic valve: Trileaflet; mildly calcified leaflets. - Mitral valve: Calcified annulus. There was trivial regurgitation. - Right ventricle: Pacer wire or catheter noted in right ventricle. - Right atrium: The atrium was mildly dilated. Central venous   pressure (est): 3 mm Hg. - Atrial septum: No defect or patent foramen ovale was identified. - Tricuspid valve: There was trivial regurgitation. - Pulmonary arteries: PA peak pressure: 23 mm Hg (S). - Pericardium, extracardiac: There was no pericardial effusion.  Impressions:  - Mild to moderate LVH with LVEF 40-45%. There is diffuse   hypokinesis with septal dyssynergy. Grade 1 diastolic dysfunction   with intermediate LV filling pressure. MAC with trivial mitral   regurgitation. Device wire noted within the right heart. Mildly   dilated right atrium. Trivial tricuspid regurgitation with normal   estimated PASP 23 mmHg.  Lexiscan Myoview 06/16/2016:  ECG was uninterpretable due to left bundle branch block. Left bundle branch block present throughout Lexiscan infusion.  Small, mild intensity, mid to apical inferoseptal defect is reversible and suggestive of a  mild degree of ischemia, although RV pacing or IVCD may also contribute to a defect in this region. No other large ischemic territories are identified.  This is a low risk study.  Nuclear stress EF: 53%.  Assessment and Plan:  1. Paroxysmal atrial fibrillation. He reports no significant palpitations and had relatively low arrhythmia burden based on most recent device check. Continue amiodarone and Coumadin. Requesting interval lab work from Dr. Scotty Court.  2. History of tachycardia-mediated cardiomyopathy. Last LVEF 40-45%. Continue beta blocker therapy. We have not added ARB with relatively low blood pressure at baseline.  3. Sick sinus syndrome, [employer]. Jude pacemaker in place with follow-up per Dr. Lovena Le. He underwent generator change in December 2017.  4. Left bundle branch block.  Current medicines were reviewed with the patient today.   Orders Placed This Encounter  Procedures  . EKG 12-Lead    Disposition: Follow-up in 6 months.  Signed, Satira Sark, MD, [facility] 09/06/2017 12:15 PM    [facility] [place], [place], [place] [address] Phone: [phone]; Fax: [phone]

## 2017-09-13 ENCOUNTER — Ambulatory Visit (INDEPENDENT_AMBULATORY_CARE_PROVIDER_SITE_OTHER): Payer: Medicare HMO | Admitting: *Deleted

## 2017-09-13 DIAGNOSIS — I495 Sick sinus syndrome: Secondary | ICD-10-CM

## 2017-09-15 NOTE — Progress Notes (Signed)
Remote pacemaker transmission.   

## 2017-09-16 ENCOUNTER — Encounter: Payer: Self-pay | Admitting: Cardiology

## 2017-09-19 DIAGNOSIS — Z23 Encounter for immunization: Secondary | ICD-10-CM | POA: Diagnosis not present

## 2017-10-03 LAB — CUP PACEART REMOTE DEVICE CHECK
Battery Remaining Percentage: 95.5 %
Battery Voltage: 3.01 V
Brady Statistic AP VP Percent: 1 %
Brady Statistic RA Percent Paced: 74 %
Brady Statistic RV Percent Paced: 1.2 %
Implantable Lead Implant Date: 20001120
Implantable Lead Location: 753859
Implantable Pulse Generator Implant Date: 20171220
Lead Channel Impedance Value: 260 Ohm
Lead Channel Pacing Threshold Amplitude: 0.75 V
Lead Channel Pacing Threshold Pulse Width: 0.5 ms
Lead Channel Setting Sensing Sensitivity: 2 mV
MDC IDC LEAD IMPLANT DT: 20001120
MDC IDC LEAD LOCATION: 753860
MDC IDC MSMT BATTERY REMAINING LONGEVITY: 96 mo
MDC IDC MSMT LEADCHNL RA IMPEDANCE VALUE: 440 Ohm
MDC IDC MSMT LEADCHNL RA SENSING INTR AMPL: 5 mV
MDC IDC MSMT LEADCHNL RV PACING THRESHOLD AMPLITUDE: 1 V
MDC IDC MSMT LEADCHNL RV PACING THRESHOLD PULSEWIDTH: 0.5 ms
MDC IDC MSMT LEADCHNL RV SENSING INTR AMPL: 7.3 mV
MDC IDC PG SERIAL: 7983565
MDC IDC SESS DTM: 20181016060019
MDC IDC SET LEADCHNL RA PACING AMPLITUDE: 2.5 V
MDC IDC SET LEADCHNL RV PACING AMPLITUDE: 2.5 V
MDC IDC SET LEADCHNL RV PACING PULSEWIDTH: 0.5 ms
MDC IDC STAT BRADY AP VS PERCENT: 78 %
MDC IDC STAT BRADY AS VP PERCENT: 1 %
MDC IDC STAT BRADY AS VS PERCENT: 22 %

## 2017-10-06 ENCOUNTER — Other Ambulatory Visit: Payer: Self-pay | Admitting: Cardiology

## 2017-10-11 ENCOUNTER — Ambulatory Visit (INDEPENDENT_AMBULATORY_CARE_PROVIDER_SITE_OTHER): Payer: Medicare HMO | Admitting: *Deleted

## 2017-10-11 DIAGNOSIS — I480 Paroxysmal atrial fibrillation: Secondary | ICD-10-CM | POA: Diagnosis not present

## 2017-10-11 DIAGNOSIS — Z5181 Encounter for therapeutic drug level monitoring: Secondary | ICD-10-CM

## 2017-10-11 LAB — POCT INR: INR: 2.2

## 2017-11-11 ENCOUNTER — Other Ambulatory Visit: Payer: Self-pay | Admitting: Internal Medicine

## 2017-11-17 ENCOUNTER — Ambulatory Visit (INDEPENDENT_AMBULATORY_CARE_PROVIDER_SITE_OTHER): Payer: Medicare HMO | Admitting: *Deleted

## 2017-11-17 DIAGNOSIS — I480 Paroxysmal atrial fibrillation: Secondary | ICD-10-CM

## 2017-11-17 DIAGNOSIS — Z5181 Encounter for therapeutic drug level monitoring: Secondary | ICD-10-CM

## 2017-11-17 LAB — POCT INR: INR: 2.1

## 2017-12-13 ENCOUNTER — Ambulatory Visit (INDEPENDENT_AMBULATORY_CARE_PROVIDER_SITE_OTHER): Payer: Medicare HMO | Admitting: *Deleted

## 2017-12-13 DIAGNOSIS — I495 Sick sinus syndrome: Secondary | ICD-10-CM | POA: Diagnosis not present

## 2017-12-15 NOTE — Progress Notes (Signed)
Remote pacemaker transmission.   

## 2017-12-16 ENCOUNTER — Encounter: Payer: Self-pay | Admitting: Cardiology

## 2017-12-16 LAB — CUP PACEART REMOTE DEVICE CHECK
Battery Remaining Longevity: 95 mo
Battery Remaining Percentage: 95.5 %
Battery Voltage: 2.99 V
Brady Statistic AP VS Percent: 77 %
Brady Statistic AS VS Percent: 22 %
Date Time Interrogation Session: 20190115070018
Implantable Lead Implant Date: 20001120
Implantable Lead Location: 753860
Implantable Pulse Generator Implant Date: 20171220
Lead Channel Impedance Value: 260 Ohm
Lead Channel Impedance Value: 460 Ohm
Lead Channel Pacing Threshold Amplitude: 0.75 V
Lead Channel Pacing Threshold Amplitude: 1 V
Lead Channel Pacing Threshold Pulse Width: 0.5 ms
Lead Channel Sensing Intrinsic Amplitude: 5 mV
Lead Channel Setting Pacing Amplitude: 2.5 V
Lead Channel Setting Pacing Pulse Width: 0.5 ms
Lead Channel Setting Sensing Sensitivity: 2 mV
MDC IDC LEAD IMPLANT DT: 20001120
MDC IDC LEAD LOCATION: 753859
MDC IDC MSMT LEADCHNL RA PACING THRESHOLD PULSEWIDTH: 0.5 ms
MDC IDC MSMT LEADCHNL RV SENSING INTR AMPL: 8.1 mV
MDC IDC SET LEADCHNL RV PACING AMPLITUDE: 2.5 V
MDC IDC STAT BRADY AP VP PERCENT: 1 %
MDC IDC STAT BRADY AS VP PERCENT: 1 %
MDC IDC STAT BRADY RA PERCENT PACED: 73 %
MDC IDC STAT BRADY RV PERCENT PACED: 1.2 %
Pulse Gen Model: 2272
Pulse Gen Serial Number: 7983565

## 2017-12-29 ENCOUNTER — Ambulatory Visit (INDEPENDENT_AMBULATORY_CARE_PROVIDER_SITE_OTHER): Payer: Medicare HMO | Admitting: *Deleted

## 2017-12-29 DIAGNOSIS — Z5181 Encounter for therapeutic drug level monitoring: Secondary | ICD-10-CM

## 2017-12-29 DIAGNOSIS — I480 Paroxysmal atrial fibrillation: Secondary | ICD-10-CM

## 2017-12-29 LAB — POCT INR: INR: 1.9

## 2017-12-29 NOTE — Patient Instructions (Signed)
Take coumadin 2 tablets tonight then increase dose to 1 1/2 tablets daily except 1 tablet on Mondays, Wednesdays and Fridays Recheck in 4 weeks

## 2018-01-26 ENCOUNTER — Telehealth: Payer: Self-pay | Admitting: Cardiology

## 2018-01-26 ENCOUNTER — Ambulatory Visit (INDEPENDENT_AMBULATORY_CARE_PROVIDER_SITE_OTHER): Payer: Medicare HMO | Admitting: *Deleted

## 2018-01-26 DIAGNOSIS — Z5181 Encounter for therapeutic drug level monitoring: Secondary | ICD-10-CM

## 2018-01-26 DIAGNOSIS — I480 Paroxysmal atrial fibrillation: Secondary | ICD-10-CM

## 2018-01-26 LAB — POCT INR: INR: 2.4

## 2018-01-26 NOTE — Telephone Encounter (Signed)
LM to return call.

## 2018-01-26 NOTE — Patient Instructions (Signed)
Continue coumadin 1 1/2 tablets daily except 1 tablet on Mondays, Wednesdays and Fridays Recheck in 4 weeks 

## 2018-01-26 NOTE — Telephone Encounter (Signed)
Has upper respiratory cough for the past two months.   ASkign if he needs to come in sooner than April.  Wasn't sure if medication was causing cough

## 2018-01-31 NOTE — Telephone Encounter (Signed)
Have left several message with no return call

## 2018-02-23 ENCOUNTER — Ambulatory Visit (INDEPENDENT_AMBULATORY_CARE_PROVIDER_SITE_OTHER): Payer: Medicare HMO | Admitting: *Deleted

## 2018-02-23 DIAGNOSIS — I480 Paroxysmal atrial fibrillation: Secondary | ICD-10-CM | POA: Diagnosis not present

## 2018-02-23 DIAGNOSIS — Z5181 Encounter for therapeutic drug level monitoring: Secondary | ICD-10-CM

## 2018-02-23 LAB — POCT INR: INR: 3

## 2018-02-23 NOTE — Patient Instructions (Signed)
Continue coumadin 1 1/2 tablets daily except 1 tablet on Mondays, Wednesdays and Fridays Recheck in 4 weeks 

## 2018-03-07 NOTE — Progress Notes (Signed)
Cardiology Office Note  Date: 03/10/2018   ID: HARIS BAACK, DOB 1941-04-04, MRN [id number]  PCP: Deloria Lair., MD  Primary Cardiologist: Rozann Lesches, MD   Chief Complaint  Patient presents with  . PAF    History of Present Illness: [name] is a 77 y.o. male last seen in October 2018.  He presents for a follow-up visit.  Reports a chronic sense of shortness of breath that has not changed in intensity.  No chest pain or palpitations.  He has had no syncope.  He is following in Dr. Rayna Sexton old office, new provider there at present.  It is not clear that he has had any interval lab work.  He continues on Coumadin with follow-up in the anticoagulation clinic.  Last INR to 3.0.  No spontaneous bleeding problems.  He has not had follow-up with Dr. Lovena Le in the device clinic since December 2017, [place] pacemaker in place.  Device interrogation in January showed 5% AT/AF.  He tells me that he does not wish to see either Dr. Rayann Heman or Dr. Lovena Le, actually missed a visit to see Dr. Caryl Comes in February which we will reschedule.  He is on low-dose amiodarone, Lopressor, and Coumadin.  TSH and LFTs were normal last January.  Past Medical History:  Diagnosis Date  . Asthma   . Bronchitis   . 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 History of epididymitis   . History of recurrent UTIs   . History of ruptured appendix   . Paroxysmal atrial fibrillation (HCC)   . Peptic ulcer disease   . Tachy-brady syndrome ([place])    PPM (SJM) implanted by Dr Caryl Comes 10/19/99, gen change 04/03/08    Past Surgical History:  Procedure Laterality Date  . EP IMPLANTABLE DEVICE [name] 11/17/2016   Procedure: PPM Generator Changeout;  Surgeon: Evans Lance, MD;  Location: [facility];  Service: Cardiovascular;  Laterality: [name];  . PACEMAKER PLACEMENT  03/2008   [employer]. Jude, [place]. 5826    Current Outpatient Medications  Medication Sig Dispense Refill  . acetaminophen  (TYLENOL) 325 MG tablet Take 650 mg by mouth as needed (back pain).     Marland Kitchen amiodarone (PACERONE) 200 MG tablet TAKE 1/2 (ONE-HALF) TABLET BY MOUTH ONCE DAILY. PATIENT  NEEDS  OFFICE  VISIT  FOR  FURTHER  REFILLS 45 tablet 1  . calcium carbonate (TUMS - DOSED IN MG ELEMENTAL CALCIUM) 500 MG chewable tablet Chew 2 tablets by mouth daily as needed for indigestion or heartburn.    . Homeopathic Products (ALLERGY MEDICINE PO) Take 1 tablet by mouth as needed (allergies).     . metoprolol tartrate (LOPRESSOR) 25 MG tablet Take 25 mg by mouth 2 (two) times daily.    . sertraline (ZOLOFT) 50 MG tablet Take 50 mg by mouth daily.     Marland Kitchen warfarin (COUMADIN) 2.5 MG tablet Take 1 1/2 tablets daily except 1 tablet on Tuesdays and Fridays (Patient taking differently: Take 1 1/2 tablets daily except 1 tablet on Tuesdays Thursday Saturday) 135 tablet 3   No current facility-administered medications for this visit.    Allergies:  Bee venom; Corn-containing products; and Codeine   Social History: The patient  reports that he quit smoking about 40 years ago. His smoking use included cigarettes. He started smoking about 58 years ago. He has a 30.00 pack-year smoking history. He has never used smokeless tobacco. He reports that he does not drink alcohol or  use drugs.   ROS:  Please see the history of present illness. Otherwise, complete review of systems is positive for aches and pains.  All other systems are reviewed and negative.   Physical Exam: VS:  BP (!) 145/85   Pulse 60   Ht _0  (1.778 m)   Wt 227 lb (103 kg)   SpO2 96%   BMI 32.57 kg/m , BMI Body mass index is 32.57 kg/m.  Wt Readings from Last 3 Encounters:  03/10/18 227 lb (103 kg)  09/06/17 219 lb (99.3 kg)  11/17/16 215 lb (97.5 kg)    General: Patient appears comfortable at rest. HEENT: Conjunctiva and lids normal, oropharynx clear. Neck: Supple, no elevated JVP or carotid bruits, no thyromegaly. Lungs: Clear to auscultation, nonlabored  breathing at rest. Cardiac: Regular rate and rhythm, no S3, soft systolic murmur. Abdomen: Soft, nontender, bowel sounds present. Extremities: No pitting edema, distal pulses 2+. Skin: Warm and dry. Musculoskeletal: No kyphosis. Neuropsychiatric: Alert and oriented x3, affect grossly appropriate.  ECG: I personally reviewed the tracing from 09/06/2017 which showed sinus rhythm with left bundle branch block.  Recent Labwork:  January 2018: TSH 1.51, BUN 16, creatinine 0.92, AST 21, ALT 20, potassium 4.1  Other Studies Reviewed Today:  Echocardiogram 06/16/2016: Study Conclusions  - Left ventricle: The cavity size was normal. Wall thickness was   increased increased in a pattern of mild to moderate LVH.   Systolic function was mildly to moderately reduced. The estimated   ejection fraction was in the range of 40% to 45%. Diffuse   hypokinesis. Doppler parameters are consistent with abnormal left   ventricular relaxation (grade 1 diastolic dysfunction). - Ventricular septum: Septal motion showed abnormal function and   dyssynergy. - Aortic valve: Trileaflet; mildly calcified leaflets. - Mitral valve: Calcified annulus. There was trivial regurgitation. - Right ventricle: Pacer wire or catheter noted in right ventricle. - Right atrium: The atrium was mildly dilated. Central venous   pressure (est): 3 mm Hg. - Atrial septum: No defect or patent foramen ovale was identified. - Tricuspid valve: There was trivial regurgitation. - Pulmonary arteries: PA peak pressure: 23 mm Hg (S). - Pericardium, extracardiac: There was no pericardial effusion.  Impressions:  - Mild to moderate LVH with LVEF 40-45%. There is diffuse   hypokinesis with septal dyssynergy. Grade 1 diastolic dysfunction   with intermediate LV filling pressure. MAC with trivial mitral   regurgitation. Device wire noted within the right heart. Mildly   dilated right atrium. Trivial tricuspid regurgitation with normal    estimated PASP 23 mmHg.  Assessment and Plan:  1.  Paroxysmal atrial fibrillation.  No reported palpitations and overall low arrhythmia burden based on last remote device interrogation.  He continues on Coumadin with follow-up in the anticoagulation clinic.  Also on beta-blocker and low-dose amiodarone.  I have recommended follow-up TSH and LFTs.  Annual eye exam.  2.  Sick sinus syndrome with [employer]. 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  He anticipates follow-up with Dr. Caryl Comes going forward.  Continue with home device interrogations.  3.  Tachycardia-mediated cardiomyopathy.  LVEF 40-45% range.  Had previously been running fairly low blood pressures.  If current pressure reflects new trend, would suggest adding ARB next.  4.  Chronic left bundle branch block.  Current medicines were reviewed with the patient today.   Orders Placed This Encounter  Procedures  . Hepatic function panel  . TSH    Disposition: Follow-up in 6 months.  Signed, Satira Sark, MD,  [facility] 03/10/2018 3:42 PM    [facility] [place], [place], [place] [address] Phone: [phone]; Fax: [phone]

## 2018-03-10 ENCOUNTER — Encounter: Payer: Self-pay | Admitting: Cardiology

## 2018-03-10 ENCOUNTER — Other Ambulatory Visit: Payer: Self-pay | Admitting: *Deleted

## 2018-03-10 ENCOUNTER — Ambulatory Visit (INDEPENDENT_AMBULATORY_CARE_PROVIDER_SITE_OTHER): Payer: Medicare HMO | Admitting: Cardiology

## 2018-03-10 VITALS — BP 145/85 | HR 60 | Ht 70.0 in | Wt 227.0 lb

## 2018-03-10 DIAGNOSIS — I480 Paroxysmal atrial fibrillation: Secondary | ICD-10-CM

## 2018-03-10 DIAGNOSIS — I495 Sick sinus syndrome: Secondary | ICD-10-CM

## 2018-03-10 DIAGNOSIS — I429 Cardiomyopathy, unspecified: Secondary | ICD-10-CM | POA: Diagnosis not present

## 2018-03-10 DIAGNOSIS — I447 Left bundle-branch block, unspecified: Secondary | ICD-10-CM | POA: Diagnosis not present

## 2018-03-10 DIAGNOSIS — Z5181 Encounter for therapeutic drug level monitoring: Secondary | ICD-10-CM

## 2018-03-10 NOTE — Patient Instructions (Signed)
Medication Instructions:   Your physician recommends that you continue on your current medications as directed. Please refer to the Current Medication list given to you today.  Labwork:  Your physician recommends that you have lab work to check your TSH and LFT's.  Testing/Procedures:  NONE  Follow-Up:  Your physician recommends that you schedule a follow-up appointment in: 6 months. You will receive a reminder letter in the mail in about 4 months reminding you to call and schedule your appointment. If you don't receive this letter, please contact our office.  Your physician recommends that you schedule a follow-up appointment with Dr. Graciela Husbands as soon as possible.  Any Other Special Instructions Will Be Listed Below (If Applicable).  If you need a refill on your cardiac medications before your next appointment, please call your pharmacy.

## 2018-03-14 ENCOUNTER — Ambulatory Visit (INDEPENDENT_AMBULATORY_CARE_PROVIDER_SITE_OTHER): Payer: Medicare HMO | Admitting: *Deleted

## 2018-03-14 DIAGNOSIS — I495 Sick sinus syndrome: Secondary | ICD-10-CM | POA: Diagnosis not present

## 2018-03-14 NOTE — Progress Notes (Signed)
Remote pacemaker transmission.   

## 2018-03-16 ENCOUNTER — Encounter: Payer: Self-pay | Admitting: Cardiology

## 2018-03-16 NOTE — Progress Notes (Signed)
Letter  

## 2018-03-20 ENCOUNTER — Ambulatory Visit (INDEPENDENT_AMBULATORY_CARE_PROVIDER_SITE_OTHER): Payer: Medicare HMO | Admitting: Internal Medicine

## 2018-03-20 ENCOUNTER — Encounter: Payer: Self-pay | Admitting: Internal Medicine

## 2018-03-20 VITALS — BP 106/68 | HR 60 | Ht 70.0 in | Wt 223.0 lb

## 2018-03-20 DIAGNOSIS — I447 Left bundle-branch block, unspecified: Secondary | ICD-10-CM | POA: Diagnosis not present

## 2018-03-20 DIAGNOSIS — I495 Sick sinus syndrome: Secondary | ICD-10-CM | POA: Diagnosis not present

## 2018-03-20 DIAGNOSIS — Z950 Presence of cardiac pacemaker: Secondary | ICD-10-CM | POA: Diagnosis not present

## 2018-03-20 DIAGNOSIS — I480 Paroxysmal atrial fibrillation: Secondary | ICD-10-CM

## 2018-03-20 LAB — CUP PACEART INCLINIC DEVICE CHECK
Battery Remaining Longevity: 110 mo
Battery Voltage: 2.99 V
Brady Statistic RA Percent Paced: 73 %
Brady Statistic RV Percent Paced: 1.1 %
Date Time Interrogation Session: 20190422152750
Implantable Lead Implant Date: 20001120
Implantable Lead Implant Date: 20001120
Implantable Lead Location: 753859
Implantable Lead Location: 753860
Implantable Pulse Generator Implant Date: 20171220
Lead Channel Impedance Value: 262.5 Ohm
Lead Channel Impedance Value: 462.5 Ohm
Lead Channel Pacing Threshold Amplitude: 0.5 V
Lead Channel Pacing Threshold Amplitude: 0.5 V
Lead Channel Pacing Threshold Amplitude: 1 V
Lead Channel Pacing Threshold Amplitude: 1 V
Lead Channel Pacing Threshold Pulse Width: 0.5 ms
Lead Channel Pacing Threshold Pulse Width: 0.5 ms
Lead Channel Pacing Threshold Pulse Width: 0.5 ms
Lead Channel Pacing Threshold Pulse Width: 0.5 ms
Lead Channel Sensing Intrinsic Amplitude: 5 mV
Lead Channel Sensing Intrinsic Amplitude: 7.2 mV
Lead Channel Setting Pacing Amplitude: 2.5 V
Lead Channel Setting Pacing Amplitude: 2.5 V
Lead Channel Setting Pacing Pulse Width: 0.5 ms
Lead Channel Setting Sensing Sensitivity: 2 mV
Pulse Gen Model: 2272
Pulse Gen Serial Number: 7983565

## 2018-03-20 MED ORDER — METOPROLOL TARTRATE 25 MG PO TABS: 25.0000 mg | ORAL_TABLET | Freq: Two times a day (BID) | ORAL | 3 refills | Status: DC

## 2018-03-20 MED ORDER — AMIODARONE HCL 200 MG PO TABS: ORAL_TABLET | ORAL | 3 refills | Status: DC

## 2018-03-20 NOTE — Progress Notes (Signed)
superficial Patient Care Team: Gwenlyn Fudge, FNP as PCP - General (Family Medicine) Lyda Kalata, MD as Surgeon (Internal Medicine) Marinus Maw, MD as Consulting Physician (Cardiology)   HPI  [name] is a 77 y.o. male Seen in followup for pacemaker implanted for tachybradycardia syndrome.   He  has paroxysmal atrial fibrillation on amiodarone.   He is anticoagulated with warfarin.  No bleeding issues  DATE TEST EF   2013 Echo   30-35 %   7/17 Echo   40-45 %   7/17 Myoview 53% No ischemia reversible defects 2/2 pacing      Date Cr TSH Hgb LFTs  12/17 1.09 1.5(1/18) 15.3      The patient denies chest pain, shortness of breath, nocturnal dyspnea, orthopnea or peripheral edema.  There have been no palpitations, lightheadedness or syncope.    Patient denies symptoms of GI intolerance, sun sensitivity, neurological symptoms attributable to amiodarone.  Surveillance laboratories were in normal limits when checked 18 months  ago    Past Medical History:  Diagnosis Date  . Asthma   . Bronchitis   . 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 History of epididymitis   . History of recurrent UTIs   . History of ruptured appendix   . Paroxysmal atrial fibrillation (HCC)   . Peptic ulcer disease   . Tachy-brady syndrome (HCC)    PPM (SJM) implanted by Dr Graciela Husbands 10/19/99, gen change 04/03/08    Past Surgical History:  Procedure Laterality Date  . EP IMPLANTABLE DEVICE [name] 11/17/2016   Procedure: PPM Generator Changeout;  Surgeon: Marinus Maw, MD;  Location: [facility];  Service: Cardiovascular;  Laterality: [name];  . PACEMAKER PLACEMENT  03/2008   [employer]. Jude, [place]. [address]10    Current Outpatient Medications  Medication Sig Dispense Refill  . acetaminophen (TYLENOL) 325 MG tablet Take 650 mg by mouth as needed (back pain).     Marland Kitchen amiodarone (PACERONE) 200 MG tablet TAKE 1/2 (ONE-HALF) TABLET BY MOUTH ONCE DAILY. PATIENT  NEEDS  OFFICE   VISIT  FOR  FURTHER  REFILLS 45 tablet 1  . calcium carbonate (TUMS - DOSED IN MG ELEMENTAL CALCIUM) 500 MG chewable tablet Chew 2 tablets by mouth daily as needed for indigestion or heartburn.    . Homeopathic Products (ALLERGY MEDICINE PO) Take 1 tablet by mouth as needed (allergies).     . metoprolol tartrate (LOPRESSOR) 25 MG tablet Take 25 mg by mouth 2 (two) times daily.    . sertraline (ZOLOFT) 50 MG tablet Take 50 mg by mouth daily.     Marland Kitchen warfarin (COUMADIN) 2.5 MG tablet Take 1 1/2 tablets daily except 1 tablet on Tuesdays and Fridays (Patient taking differently: Take 1 1/2 tablets daily except 1 tablet on Tuesdays Thursday Saturday) 135 tablet 3   No current facility-administered medications for this visit.     Allergies  Allergen Reactions  . Bee Venom Shortness Of Breath    BEE STINGS - dizziness   . Corn-Containing Products Anaphylaxis    Corn bread, pop corn, all corn products  . Codeine     REACTION: muscle cramps    Review of Systems negative except from HPI and PMH  Physical Exam BP 106/68   Pulse 60   Ht 5\' 10"  (1.778 m)   Wt 223 lb (101.2 kg)   SpO2 95%   BMI 32.00 kg/m  Well developed and well nourished in no acute distress HENT normal  E scleral and icterus clear Neck Supple JVP flat; carotids brisk and full Clear to ausculation  Regular rate and rhythm, no murmurs gallops or rub Soft with active bowel sounds No clubbing cyanosis none Edema Alert and oriented, grossly normal motor and sensory function Skin Warm and Dry  ECG personally reviewed sinus @ 60   Assessment and  Plan  Sinus node dysfunction  Cardiomyopathy--interval improvement  Pacemaker St Jude The patient's device was interrogated.  The information was reviewed. No changes were made in the programming.    Atrial fibrillation-paroxysmal  High Risk Medication Surveillance   On Anticoagulation;  No bleeding issues;  Need to check cbc  Tolerating the amio  Will need surveillance  data  meds appropriate with improving EF   We spent more than 50% of our >25 min visit in face to face counseling regarding the above

## 2018-03-20 NOTE — Patient Instructions (Addendum)
Medication Instructions:  Your physician recommends that you continue on your current medications as directed. Please refer to the Current Medication list given to you today.  Labwork: You will have the following lab work drawn today: CBC, BMP, LFTs, and TSH  Testing/Procedures: Your physician has recommended that you have a sleep study. This test records several body functions during sleep, including: brain activity, eye movement, oxygen and carbon dioxide blood levels, heart rate and rhythm, breathing rate and rhythm, the flow of air through your mouth and nose, snoring, body muscle movements, and chest and belly movement.   Follow-Up: Your physician wants you to follow-up in: One Year with Dr Graciela Husbands. You will receive a reminder letter in the mail two months in advance. If you don't receive a letter, please call our office to schedule the follow-up appointment.  Remote monitoring is used to monitor your Pacemaker of ICD from home. This monitoring reduces the number of office visits required to check your device to one time per year. It allows [place] to keep an eye on the functioning of your device to ensure it is working properly. You are scheduled for a device check from home on 06/13/2018. You may send your transmission at any time that day. If you have a wireless device, the transmission will be sent automatically. After your physician reviews your transmission, you will receive a postcard with your next transmission date.   Any Other Special Instructions Will Be Listed Below (If Applicable).     If you need a refill on your cardiac medications before your next appointment, please call your pharmacy.

## 2018-03-21 LAB — HEPATIC FUNCTION PANEL
ALBUMIN: 4.5 g/dL (ref 3.5–4.8)
ALT: 22 IU/L (ref 0–44)
AST: 25 IU/L (ref 0–40)
Alkaline Phosphatase: 66 IU/L (ref 39–117)
Bilirubin Total: 0.6 mg/dL (ref 0.0–1.2)
Bilirubin, Direct: 0.12 mg/dL (ref 0.00–0.40)
Total Protein: 7.4 g/dL (ref 6.0–8.5)

## 2018-03-21 LAB — CUP PACEART REMOTE DEVICE CHECK
Brady Statistic AP VP Percent: 1 %
Brady Statistic AP VS Percent: 77 %
Brady Statistic AS VP Percent: 1 %
Brady Statistic RA Percent Paced: 73 %
Brady Statistic RV Percent Paced: 1.2 %
Date Time Interrogation Session: 20190416060014
Implantable Lead Implant Date: 20001120
Implantable Lead Location: 753859
Implantable Lead Location: 753860
Lead Channel Impedance Value: 260 Ohm
Lead Channel Impedance Value: 460 Ohm
Lead Channel Pacing Threshold Amplitude: 1 V
Lead Channel Pacing Threshold Pulse Width: 0.5 ms
Lead Channel Sensing Intrinsic Amplitude: 6.7 mV
Lead Channel Setting Pacing Amplitude: 2.5 V
Lead Channel Setting Pacing Pulse Width: 0.5 ms
MDC IDC LEAD IMPLANT DT: 20001120
MDC IDC MSMT BATTERY REMAINING LONGEVITY: 101 mo
MDC IDC MSMT BATTERY REMAINING PERCENTAGE: 95.5 %
MDC IDC MSMT BATTERY VOLTAGE: 2.99 V
MDC IDC MSMT LEADCHNL RA PACING THRESHOLD AMPLITUDE: 0.75 V
MDC IDC MSMT LEADCHNL RA SENSING INTR AMPL: 5 mV
MDC IDC MSMT LEADCHNL RV PACING THRESHOLD PULSEWIDTH: 0.5 ms
MDC IDC PG IMPLANT DT: 20171220
MDC IDC SET LEADCHNL RV PACING AMPLITUDE: 2.5 V
MDC IDC SET LEADCHNL RV SENSING SENSITIVITY: 2 mV
MDC IDC STAT BRADY AS VS PERCENT: 22 %
Pulse Gen Model: 2272
Pulse Gen Serial Number: 7983565

## 2018-03-21 LAB — CBC WITH DIFFERENTIAL/PLATELET
Basophils Absolute: 0 10*3/uL (ref 0.0–0.2)
Basos: 1 %
EOS (ABSOLUTE): 0.2 10*3/uL (ref 0.0–0.4)
EOS: 2 %
HEMATOCRIT: 48.3 % (ref 37.5–51.0)
Hemoglobin: 16.1 g/dL (ref 13.0–17.7)
IMMATURE GRANULOCYTES: 0 %
Immature Grans (Abs): 0 10*3/uL (ref 0.0–0.1)
LYMPHS ABS: 1.7 10*3/uL (ref 0.7–3.1)
Lymphs: 23 %
MCH: 29.2 pg (ref 26.6–33.0)
MCHC: 33.3 g/dL (ref 31.5–35.7)
MCV: 88 fL (ref 79–97)
MONOS ABS: 0.5 10*3/uL (ref 0.1–0.9)
Monocytes: 7 %
NEUTROS ABS: 4.7 10*3/uL (ref 1.4–7.0)
Neutrophils: 67 %
Platelets: 214 10*3/uL (ref 150–379)
RBC: 5.51 x10E6/uL (ref 4.14–5.80)
RDW: 15.1 % (ref 12.3–15.4)
WBC: 7.1 10*3/uL (ref 3.4–10.8)

## 2018-03-21 LAB — BASIC METABOLIC PANEL
BUN / CREAT RATIO: 13 (ref 10–24)
BUN: 16 mg/dL (ref 8–27)
CO2: 22 mmol/L (ref 20–29)
CREATININE: 1.2 mg/dL (ref 0.76–1.27)
Calcium: 10.2 mg/dL (ref 8.6–10.2)
Chloride: 102 mmol/L (ref 96–106)
GFR, EST AFRICAN AMERICAN: 68 mL/min/{1.73_m2} (ref >59)
GFR, EST NON AFRICAN AMERICAN: 59 mL/min/{1.73_m2} — AB (ref >59)
Glucose: 88 mg/dL (ref 65–99)
Potassium: 4.4 mmol/L (ref 3.5–5.2)
SODIUM: 141 mmol/L (ref 134–144)

## 2018-03-21 LAB — TSH: TSH: 2.19 u[IU]/mL (ref 0.450–4.500)

## 2018-03-23 ENCOUNTER — Ambulatory Visit (INDEPENDENT_AMBULATORY_CARE_PROVIDER_SITE_OTHER): Payer: Medicare HMO | Admitting: *Deleted

## 2018-03-23 DIAGNOSIS — Z5181 Encounter for therapeutic drug level monitoring: Secondary | ICD-10-CM | POA: Diagnosis not present

## 2018-03-23 DIAGNOSIS — I480 Paroxysmal atrial fibrillation: Secondary | ICD-10-CM | POA: Diagnosis not present

## 2018-03-23 LAB — POCT INR: INR: 2.4

## 2018-03-23 NOTE — Patient Instructions (Signed)
Continue coumadin 1 1/2  tablets daily except 1 tablet on Mondays, Wednesdays and Fridays  Recheck in 6 weeks 

## 2018-04-03 ENCOUNTER — Other Ambulatory Visit: Payer: Self-pay | Admitting: Cardiology

## 2018-04-04 ENCOUNTER — Other Ambulatory Visit: Payer: Self-pay | Admitting: *Deleted

## 2018-04-04 MED ORDER — WARFARIN SODIUM 2.5 MG PO TABS: ORAL_TABLET | ORAL | 3 refills | Status: DC

## 2018-04-06 ENCOUNTER — Telehealth: Payer: Self-pay | Admitting: *Deleted

## 2018-04-06 NOTE — Telephone Encounter (Signed)
Informed patient of upcoming home sleep study and patient understanding was verbalized. Patient states "he wants to cancel having his sleep study for now until he calls [place] back at a later date".

## 2018-04-06 NOTE — Telephone Encounter (Signed)
-----   Message from Gaynelle Cage, CMA sent at 04/05/2018  2:54 PM EDT ----- Regarding: RE: PRE CERT Ok to order HST no PA required. ----- Message ----- From: Reesa Chew, CMA Sent: 04/05/2018  10:09 AM To: [facility] Subject: PRE CERT                                         ----- Message ----- From: Oretha Milch, RN Sent: 03/20/2018  12:19 PM To: Reesa Chew, CMA, #  Hello  Mr Leer will need to be set up with a home sleep study and pre cert.  Thanks!

## 2018-04-25 NOTE — Telephone Encounter (Signed)
Have you talked with patient again about cancelling 2 appt

## 2018-04-25 NOTE — Telephone Encounter (Signed)
Patient was scheduled on 5/17 and 6/18 for HST and he cancelled both. It is patients responsibility to call [facility] to reschedule.

## 2018-04-26 NOTE — Telephone Encounter (Signed)
Please let provider who ordered the study know

## 2018-04-26 NOTE — Telephone Encounter (Signed)
Yes, Patient states he does not wish to test at all.

## 2018-05-04 ENCOUNTER — Ambulatory Visit (INDEPENDENT_AMBULATORY_CARE_PROVIDER_SITE_OTHER): Payer: Medicare HMO | Admitting: *Deleted

## 2018-05-04 DIAGNOSIS — Z5181 Encounter for therapeutic drug level monitoring: Secondary | ICD-10-CM

## 2018-05-04 DIAGNOSIS — I480 Paroxysmal atrial fibrillation: Secondary | ICD-10-CM

## 2018-05-04 LAB — POCT INR: INR: 2.6 (ref 2.0–3.0)

## 2018-05-04 NOTE — Patient Instructions (Signed)
Continue coumadin 1 1/2  tablets daily except 1 tablet on Mondays, Wednesdays and Fridays  Recheck in 6 weeks 

## 2018-05-16 ENCOUNTER — Encounter (HOSPITAL_BASED_OUTPATIENT_CLINIC_OR_DEPARTMENT_OTHER): Payer: Medicare HMO

## 2018-06-13 ENCOUNTER — Ambulatory Visit (INDEPENDENT_AMBULATORY_CARE_PROVIDER_SITE_OTHER): Payer: Medicare HMO | Admitting: *Deleted

## 2018-06-13 DIAGNOSIS — I495 Sick sinus syndrome: Secondary | ICD-10-CM

## 2018-06-13 NOTE — Progress Notes (Signed)
Remote pacemaker transmission.   

## 2018-06-14 ENCOUNTER — Encounter: Payer: Self-pay | Admitting: Cardiology

## 2018-06-15 ENCOUNTER — Ambulatory Visit (INDEPENDENT_AMBULATORY_CARE_PROVIDER_SITE_OTHER): Payer: Medicare HMO | Admitting: *Deleted

## 2018-06-15 DIAGNOSIS — I480 Paroxysmal atrial fibrillation: Secondary | ICD-10-CM | POA: Diagnosis not present

## 2018-06-15 DIAGNOSIS — Z5181 Encounter for therapeutic drug level monitoring: Secondary | ICD-10-CM | POA: Diagnosis not present

## 2018-06-15 LAB — POCT INR: INR: 2.8 (ref 2.0–3.0)

## 2018-06-15 NOTE — Patient Instructions (Signed)
Continue coumadin 1 1/2  tablets daily except 1 tablet on Mondays, Wednesdays and Fridays  Recheck in 6 weeks 

## 2018-06-24 LAB — CUP PACEART REMOTE DEVICE CHECK
Battery Remaining Longevity: 99 mo
Battery Remaining Percentage: 95.5 %
Battery Voltage: 2.99 V
Brady Statistic AS VS Percent: 18 %
Brady Statistic RA Percent Paced: 77 %
Date Time Interrogation Session: 20190716060014
Implantable Lead Implant Date: 20001120
Implantable Lead Implant Date: 20001120
Implantable Lead Location: 753859
Implantable Pulse Generator Implant Date: 20171220
Lead Channel Impedance Value: 430 Ohm
Lead Channel Pacing Threshold Amplitude: 0.5 V
Lead Channel Pacing Threshold Pulse Width: 0.5 ms
Lead Channel Pacing Threshold Pulse Width: 0.5 ms
Lead Channel Sensing Intrinsic Amplitude: 5 mV
Lead Channel Setting Pacing Amplitude: 2.5 V
Lead Channel Setting Sensing Sensitivity: 2 mV
MDC IDC LEAD LOCATION: 753860
MDC IDC MSMT LEADCHNL RV IMPEDANCE VALUE: 260 Ohm
MDC IDC MSMT LEADCHNL RV PACING THRESHOLD AMPLITUDE: 1 V
MDC IDC MSMT LEADCHNL RV SENSING INTR AMPL: 7.2 mV
MDC IDC PG SERIAL: 7983565
MDC IDC SET LEADCHNL RA PACING AMPLITUDE: 2.5 V
MDC IDC SET LEADCHNL RV PACING PULSEWIDTH: 0.5 ms
MDC IDC STAT BRADY AP VP PERCENT: 1 %
MDC IDC STAT BRADY AP VS PERCENT: 82 %
MDC IDC STAT BRADY AS VP PERCENT: 1 %
MDC IDC STAT BRADY RV PERCENT PACED: 1 %

## 2018-07-27 ENCOUNTER — Ambulatory Visit (INDEPENDENT_AMBULATORY_CARE_PROVIDER_SITE_OTHER): Payer: Medicare HMO | Admitting: *Deleted

## 2018-07-27 DIAGNOSIS — Z5181 Encounter for therapeutic drug level monitoring: Secondary | ICD-10-CM | POA: Diagnosis not present

## 2018-07-27 DIAGNOSIS — I480 Paroxysmal atrial fibrillation: Secondary | ICD-10-CM | POA: Diagnosis not present

## 2018-07-27 LAB — POCT INR: INR: 3.3 — AB (ref 2.0–3.0)

## 2018-07-27 NOTE — Patient Instructions (Signed)
Take coumadin 1/2 tablet tonight then resume 1 1/2 tablets daily except 1 tablet on Mondays, Wednesdays and Fridays Recheck in 4 weeks

## 2018-09-12 ENCOUNTER — Ambulatory Visit (INDEPENDENT_AMBULATORY_CARE_PROVIDER_SITE_OTHER): Payer: Medicare HMO | Admitting: *Deleted

## 2018-09-12 DIAGNOSIS — Z5181 Encounter for therapeutic drug level monitoring: Secondary | ICD-10-CM | POA: Diagnosis not present

## 2018-09-12 DIAGNOSIS — I480 Paroxysmal atrial fibrillation: Secondary | ICD-10-CM

## 2018-09-12 DIAGNOSIS — I495 Sick sinus syndrome: Secondary | ICD-10-CM

## 2018-09-12 LAB — POCT INR: INR: 2.7 (ref 2.0–3.0)

## 2018-09-12 NOTE — Patient Instructions (Signed)
Continue coumadin 1 1/2 tablets daily except 1 tablet on Mondays, Wednesdays and Fridays Recheck in 4 weeks 

## 2018-09-13 NOTE — Progress Notes (Signed)
Remote pacemaker transmission.   

## 2018-09-28 ENCOUNTER — Other Ambulatory Visit: Payer: Self-pay | Admitting: *Deleted

## 2018-09-28 MED ORDER — WARFARIN SODIUM 2.5 MG PO TABS: ORAL_TABLET | ORAL | 3 refills | Status: DC

## 2018-10-10 NOTE — Progress Notes (Signed)
Cardiology Office Note  Date: 10/12/2018   ID: [name], DOB 1941-11-14, MRN [id number]  PCP: Loman Brooklyn, FNP  Primary Cardiologist: Rozann Lesches, MD   Chief Complaint  Patient presents with  . Atrial Fibrillation    History of Present Illness: [name] is a 77 y.o. male last seen in April.  He is here for a routine follow-up visit.  He does not report any palpitations or chest pain.  Has stable, chronic dyspnea on exertion.  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  He is on Coumadin with follow-up in the anticoagulation clinic.  INR today was 2.4.  He denies any spontaneous bleeding problems or changes in stool.  He follows with Dr. Caryl Comes in the device clinic, [place] pacemaker in place.  Office visit from April noted.  I reviewed his lab work from April which showed normal LFTs and TSH.  Past Medical History:  Diagnosis Date  . Asthma   . Bronchitis   . 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 History of epididymitis   . History of recurrent UTIs   . History of ruptured appendix   . Paroxysmal atrial fibrillation (HCC)   . Peptic ulcer disease   . Tachy-brady syndrome ([place])    PPM (SJM) implanted by Dr Caryl Comes 10/19/99, gen change 04/03/08    Past Surgical History:  Procedure Laterality Date  . EP IMPLANTABLE DEVICE [name] 11/17/2016   Procedure: PPM Generator Changeout;  Surgeon: Evans Lance, MD;  Location: [facility] [facility];  Service: Cardiovascular;  Laterality: [name];  . PACEMAKER PLACEMENT  03/2008   [employer], [place]. 5826    Current Outpatient Medications  Medication Sig Dispense Refill  . acetaminophen (TYLENOL) 325 MG tablet Take 650 mg by mouth as needed (back pain).     Marland Kitchen amiodarone (PACERONE) 200 MG tablet TAKE 1/2 (ONE-HALF) TABLET BY MOUTH ONCE DAILY. 45 tablet 3  . calcium carbonate (TUMS - DOSED IN MG ELEMENTAL CALCIUM) 500 MG  chewable tablet Chew 2 tablets by mouth daily as needed for indigestion or heartburn.    . diphenhydrAMINE (BENADRYL) 25 MG tablet Take 25 mg by mouth as needed.    . diphenhydrAMINE-PSE-APAP (ALLERGY/SINUS HEADACHE PO) Take by mouth as needed.    . Homeopathic Products (ALLERGY MEDICINE PO) Take 1 tablet by mouth as needed (allergies).     . metoprolol tartrate (LOPRESSOR) 25 MG tablet Take 1 tablet (25 mg total) by mouth 2 (two) times daily. 180 tablet 3  . sertraline (ZOLOFT) 50 MG tablet Take 50 mg by mouth daily.     Marland Kitchen warfarin (COUMADIN) 2.5 MG tablet TAKE 1 & 1/2 (ONE & ONE-HALF) TABLETS BY MOUTH ONCE DAILY EXCEPT  1  TABLET  ON  MONDAYS, WEDNESDAYS AND FRIDAYS 135 tablet 3   No current facility-administered medications for this visit.    Allergies:  Bee venom; Corn-containing products; and Codeine   Social History: The patient  reports that he quit smoking about 40 years ago. His smoking use included cigarettes. He started smoking about 59 years ago. He has a 30.00 pack-year smoking history. He has never used smokeless tobacco. He reports that he does not drink alcohol or use drugs.   ROS:  Please see the history of present illness. Otherwise, complete review of systems is positive for allergic rhinitis.  All other systems are reviewed and negative.  Physical Exam: VS:  BP 118/68   Pulse 60   Ht 5' 10"  (1.778 m)   Wt 227 lb (103 kg)   SpO2 96%   BMI 32.57 kg/m , BMI Body mass index is 32.57 kg/m.  Wt Readings from Last 3 Encounters:  10/12/18 227 lb (103 kg)  03/20/18 223 lb (101.2 kg)  03/10/18 227 lb (103 kg)    General: Elderly male, appears comfortable at rest. HEENT: Conjunctiva and lids normal, oropharynx clear. Neck: Supple, no elevated JVP or carotid bruits, no thyromegaly. Lungs: Decreased breath sounds without wheezing, nonlabored breathing at rest. Cardiac: Regular rate and rhythm, no S3, no systolic murmur. Abdomen: Soft, nontender, bowel sounds  present. Extremities: Trace ankle edema, distal pulses 2+. Skin: Warm and dry. Musculoskeletal: No kyphosis. Neuropsychiatric: Alert and oriented x3, affect grossly appropriate.  ECG: I personally reviewed the tracing from 03/20/2018 which showed an atrial paced rhythm with left bundle branch block.  Recent Labwork: 03/20/2018: ALT 22; AST 25; BUN 16; Creatinine, Ser 1.20; Hemoglobin 16.1; Platelets 214; Potassium 4.4; Sodium 141; TSH 2.190   Other Studies Reviewed Today:  Echocardiogram 06/16/2016: Study Conclusions  - Left ventricle: The cavity size was normal. Wall thickness was   increased increased in a pattern of mild to moderate LVH.   Systolic function was mildly to moderately reduced. The estimated   ejection fraction was in the range of 40% to 45%. Diffuse   hypokinesis. Doppler parameters are consistent with abnormal left   ventricular relaxation (grade 1 diastolic dysfunction). - Ventricular septum: Septal motion showed abnormal function and   dyssynergy. - Aortic valve: Trileaflet; mildly calcified leaflets. - Mitral valve: Calcified annulus. There was trivial regurgitation. - Right ventricle: Pacer wire or catheter noted in right ventricle. - Right atrium: The atrium was mildly dilated. Central venous   pressure (est): 3 mm Hg. - Atrial septum: No defect or patent foramen ovale was identified. - Tricuspid valve: There was trivial regurgitation. - Pulmonary arteries: PA peak pressure: 23 mm Hg (S). - Pericardium, extracardiac: There was no pericardial effusion.  Impressions:  - Mild to moderate LVH with LVEF 40-45%. There is diffuse   hypokinesis with septal dyssynergy. Grade 1 diastolic dysfunction   with intermediate LV filling pressure. MAC with trivial mitral   regurgitation. Device wire noted within the right heart. Mildly   dilated right atrium. Trivial tricuspid regurgitation with normal   estimated PASP 23 mmHg.  Assessment and Plan:  1.  Paroxysmal  atrial fibrillation.  He is symptomatically stable without significant palpitations.  Continue Coumadin with follow-up in the anticoagulation clinic.  Also metoprolol and low-dose amiodarone.  TSH and LFTs reviewed from earlier in the year.  2.  Sinus node dysfunction with [facility] pacemaker in place.  He continues to follow with Dr. Caryl Comes, last seen in April.  3.  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  4.  Left bundle branch block.  Current medicines were reviewed with the patient today.  Disposition: Follow-up in 6 months.   Signed, Satira Sark, MD, [facility] 10/12/2018 11:47 AM    [facility] [place] [facility] [place], [place], [place] [address] Phone: [phone]; Fax: [phone]

## 2018-10-12 ENCOUNTER — Ambulatory Visit (INDEPENDENT_AMBULATORY_CARE_PROVIDER_SITE_OTHER): Payer: Medicare HMO | Admitting: *Deleted

## 2018-10-12 ENCOUNTER — Ambulatory Visit (INDEPENDENT_AMBULATORY_CARE_PROVIDER_SITE_OTHER): Payer: Medicare HMO | Admitting: Cardiology

## 2018-10-12 ENCOUNTER — Encounter: Payer: Self-pay | Admitting: Cardiology

## 2018-10-12 VITALS — BP 118/68 | HR 60 | Ht 70.0 in | Wt 227.0 lb

## 2018-10-12 DIAGNOSIS — I480 Paroxysmal atrial fibrillation: Secondary | ICD-10-CM

## 2018-10-12 DIAGNOSIS — I447 Left bundle-branch block, unspecified: Secondary | ICD-10-CM

## 2018-10-12 DIAGNOSIS — Z5181 Encounter for therapeutic drug level monitoring: Secondary | ICD-10-CM | POA: Diagnosis not present

## 2018-10-12 DIAGNOSIS — I495 Sick sinus syndrome: Secondary | ICD-10-CM | POA: Diagnosis not present

## 2018-10-12 DIAGNOSIS — I429 Cardiomyopathy, unspecified: Secondary | ICD-10-CM | POA: Diagnosis not present

## 2018-10-12 LAB — POCT INR: INR: 2.4 (ref 2.0–3.0)

## 2018-10-12 MED ORDER — WARFARIN SODIUM 2.5 MG PO TABS: ORAL_TABLET | ORAL | 3 refills | Status: DC

## 2018-10-12 NOTE — Patient Instructions (Addendum)

## 2018-10-12 NOTE — Patient Instructions (Signed)
Continue coumadin 1 1/2 tablets daily except 1 tablet on Mondays, Wednesdays and Fridays Recheck in 5 weeks 

## 2018-11-16 ENCOUNTER — Ambulatory Visit (INDEPENDENT_AMBULATORY_CARE_PROVIDER_SITE_OTHER): Payer: Medicare HMO | Admitting: *Deleted

## 2018-11-16 DIAGNOSIS — I480 Paroxysmal atrial fibrillation: Secondary | ICD-10-CM | POA: Diagnosis not present

## 2018-11-16 DIAGNOSIS — Z5181 Encounter for therapeutic drug level monitoring: Secondary | ICD-10-CM

## 2018-11-16 LAB — POCT INR: INR: 2.3 (ref 2.0–3.0)

## 2018-11-16 NOTE — Patient Instructions (Signed)
Continue coumadin 1 1/2  tablets daily except 1 tablet on Mondays, Wednesdays and Fridays  Recheck in 6 weeks 

## 2018-11-18 LAB — CUP PACEART REMOTE DEVICE CHECK
Battery Remaining Longevity: 99 mo
Brady Statistic AP VP Percent: 1 %
Brady Statistic AP VS Percent: 83 %
Brady Statistic AS VP Percent: 1 %
Date Time Interrogation Session: 20191014060013
Implantable Lead Location: 753860
Lead Channel Impedance Value: 430 Ohm
Lead Channel Pacing Threshold Amplitude: 1 V
Lead Channel Pacing Threshold Pulse Width: 0.5 ms
Lead Channel Sensing Intrinsic Amplitude: 7 mV
Lead Channel Setting Pacing Amplitude: 2.5 V
Lead Channel Setting Pacing Amplitude: 2.5 V
Lead Channel Setting Pacing Pulse Width: 0.5 ms
MDC IDC LEAD IMPLANT DT: 20001120
MDC IDC LEAD IMPLANT DT: 20001120
MDC IDC LEAD LOCATION: 753859
MDC IDC MSMT BATTERY REMAINING PERCENTAGE: 95.5 %
MDC IDC MSMT BATTERY VOLTAGE: 2.99 V
MDC IDC MSMT LEADCHNL RA PACING THRESHOLD AMPLITUDE: 0.5 V
MDC IDC MSMT LEADCHNL RA SENSING INTR AMPL: 5 mV
MDC IDC MSMT LEADCHNL RV IMPEDANCE VALUE: 260 Ohm
MDC IDC MSMT LEADCHNL RV PACING THRESHOLD PULSEWIDTH: 0.5 ms
MDC IDC PG IMPLANT DT: 20171220
MDC IDC PG SERIAL: 7983565
MDC IDC SET LEADCHNL RV SENSING SENSITIVITY: 2 mV
MDC IDC STAT BRADY AS VS PERCENT: 17 %
MDC IDC STAT BRADY RA PERCENT PACED: 79 %
MDC IDC STAT BRADY RV PERCENT PACED: 1 %

## 2018-12-12 ENCOUNTER — Ambulatory Visit (INDEPENDENT_AMBULATORY_CARE_PROVIDER_SITE_OTHER): Payer: Medicare HMO

## 2018-12-12 DIAGNOSIS — I495 Sick sinus syndrome: Secondary | ICD-10-CM

## 2018-12-13 NOTE — Progress Notes (Signed)
Remote pacemaker transmission.   

## 2018-12-16 LAB — CUP PACEART REMOTE DEVICE CHECK
Battery Remaining Longevity: 100 mo
Brady Statistic AP VP Percent: 1 %
Brady Statistic AP VS Percent: 80 %
Brady Statistic AS VP Percent: 1 %
Brady Statistic AS VS Percent: 19 %
Brady Statistic RA Percent Paced: 75 %
Implantable Lead Implant Date: 20001120
Implantable Lead Location: 753859
Implantable Lead Location: 753860
Lead Channel Impedance Value: 250 Ohm
Lead Channel Impedance Value: 450 Ohm
Lead Channel Pacing Threshold Amplitude: 1 V
Lead Channel Pacing Threshold Pulse Width: 0.5 ms
Lead Channel Sensing Intrinsic Amplitude: 7.3 mV
Lead Channel Setting Pacing Amplitude: 2.5 V
Lead Channel Setting Pacing Amplitude: 2.5 V
Lead Channel Setting Pacing Pulse Width: 0.5 ms
Lead Channel Setting Sensing Sensitivity: 2 mV
MDC IDC LEAD IMPLANT DT: 20001120
MDC IDC MSMT BATTERY REMAINING PERCENTAGE: 95.5 %
MDC IDC MSMT BATTERY VOLTAGE: 3.01 V
MDC IDC MSMT LEADCHNL RA PACING THRESHOLD AMPLITUDE: 0.5 V
MDC IDC MSMT LEADCHNL RA SENSING INTR AMPL: 5 mV
MDC IDC MSMT LEADCHNL RV PACING THRESHOLD PULSEWIDTH: 0.5 ms
MDC IDC PG IMPLANT DT: 20171220
MDC IDC PG SERIAL: 7983565
MDC IDC SESS DTM: 20200114070027
MDC IDC STAT BRADY RV PERCENT PACED: 1 %

## 2018-12-18 ENCOUNTER — Telehealth: Payer: Self-pay | Admitting: *Deleted

## 2018-12-18 NOTE — Telephone Encounter (Signed)
Spoke with patient. He is agreeable to scheduling appointment. He is currently driving, so he requests a call back tomorrow to schedule. Patient reports he has been under a lot of stress the past few weeks. He has noted some chest pressure, seems to correlate with elevated HR. Patient reports he feels fine right now. V rates not well controlled during AT/AF episodes per device.  Advised scheduler will be in touch tomorrow to schedule f/u per Dr. Graciela Husbands. ED precautions given for ShOB, chest discomfort, persistent elevated HR or other cardiac symptoms. Patient verbalizes understanding and thanked me for my call.

## 2018-12-18 NOTE — Telephone Encounter (Signed)
-----   Message from Duke Salvia, MD sent at 12/18/2018  9:00 AM EST ----- Device remote reviewed. Remote is normal.  Battery status is good.  Lead measurements unchanged.  Histograms are appropriate. Having increasing amounts of afib Could you plz arrange followup with Me, APP or Afib clinc to discuss uptitration of amiodarone for symptomatic deterioration

## 2018-12-25 ENCOUNTER — Ambulatory Visit: Payer: Medicare HMO | Admitting: Internal Medicine

## 2018-12-25 VITALS — BP 124/82 | HR 64 | Resp 15 | Ht 70.0 in | Wt 229.6 lb

## 2018-12-25 DIAGNOSIS — I480 Paroxysmal atrial fibrillation: Secondary | ICD-10-CM | POA: Diagnosis not present

## 2018-12-25 DIAGNOSIS — I495 Sick sinus syndrome: Secondary | ICD-10-CM | POA: Diagnosis not present

## 2018-12-25 DIAGNOSIS — I447 Left bundle-branch block, unspecified: Secondary | ICD-10-CM | POA: Diagnosis not present

## 2018-12-25 DIAGNOSIS — Z950 Presence of cardiac pacemaker: Secondary | ICD-10-CM | POA: Diagnosis not present

## 2018-12-25 LAB — CUP PACEART INCLINIC DEVICE CHECK
Date Time Interrogation Session: 20200127175715
Implantable Lead Implant Date: 20001120
Implantable Lead Location: 753859
MDC IDC LEAD IMPLANT DT: 20001120
MDC IDC LEAD LOCATION: 753860
MDC IDC PG IMPLANT DT: 20171220
Pulse Gen Model: 2272
Pulse Gen Serial Number: 7983565

## 2018-12-25 MED ORDER — AMIODARONE HCL 200 MG PO TABS: ORAL_TABLET | ORAL | 3 refills | Status: DC

## 2018-12-25 MED ORDER — METOPROLOL TARTRATE 25 MG PO TABS: 25.0000 mg | ORAL_TABLET | Freq: Two times a day (BID) | ORAL | 3 refills | Status: DC

## 2018-12-25 NOTE — Progress Notes (Signed)
[facility] Patient Care Team: Gwenlyn Fudge, FNP as PCP - General (Family Medicine) Jonelle Sidle, MD as PCP - Cardiology (Cardiology) Lyda Kalata, MD as Surgeon (Internal Medicine) Marinus Maw, MD as Consulting Physician (Cardiology)   HPI  [name] is a 78 y.o. 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   He  has paroxysmal atrial fibrillation on amiodarone.   He is anticoagulated with warfarin.   No bleeding   DATE TEST EF   2013 Echo   30-35 %   7/17 Echo   40-45 %   7/17 Myoview 53% No ischemia reversible defects 2/2 pacing      Date Cr K TSH Hgb LFTs  12/17 1.09  1.5(1/18) 15.3    4/19 1.20 4.4 2.19 16.1 25           The patient denies SOB, chest pain edema    There has been no syncope or presyncope., Has recurrent palpitations, correlating with device documented Afib   Patient denies symptoms of GI intolerance, sun sensitivity attributable to amiodarone.  Has gait instability which has caused him to walk with a cane.  Somewhat wide based.  Has not seen neurology  Cough x 3 days with pharyngitis and rhinorhea   Past Medical History:  Diagnosis Date  . Asthma   . Bronchitis   . 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 History of epididymitis   . History of recurrent UTIs   . History of ruptured appendix   . Paroxysmal atrial fibrillation (HCC)   . Peptic ulcer disease   . Tachy-brady syndrome (HCC)    PPM (SJM) implanted by Dr Graciela Husbands 10/19/99, gen change 04/03/08    Past Surgical History:  Procedure Laterality Date  . EP IMPLANTABLE DEVICE [name] 11/17/2016   Procedure: PPM Generator Changeout;  Surgeon: Marinus Maw, MD;  Location: [facility] INVASIVE [facility];  Service: Cardiovascular;  Laterality: [name];  . PACEMAKER PLACEMENT  03/2008   [employer], [place]. 6578    Current Outpatient Medications  Medication Sig Dispense Refill  . acetaminophen (TYLENOL) 325 MG  tablet Take 650 mg by mouth as needed (back pain).     Marland Kitchen amiodarone (PACERONE) 200 MG tablet TAKE 1/2 (ONE-HALF) TABLET BY MOUTH ONCE DAILY. 45 tablet 3  . calcium carbonate (TUMS - DOSED IN MG ELEMENTAL CALCIUM) 500 MG chewable tablet Chew 2 tablets by mouth daily as needed for indigestion or heartburn.    . diphenhydrAMINE (BENADRYL) 25 MG tablet Take 25 mg by mouth as needed.    . diphenhydrAMINE-PSE-APAP (ALLERGY/SINUS HEADACHE PO) Take by mouth as needed.    . Homeopathic Products (ALLERGY MEDICINE PO) Take 1 tablet by mouth as needed (allergies).     . metoprolol tartrate (LOPRESSOR) 25 MG tablet Take 1 tablet (25 mg total) by mouth 2 (two) times daily. 180 tablet 3  . sertraline (ZOLOFT) 50 MG tablet Take 50 mg by mouth daily.     Marland Kitchen warfarin (COUMADIN) 2.5 MG tablet TAKE 1 & 1/2 (ONE & ONE-HALF) TABLETS BY MOUTH ONCE DAILY EXCEPT  1  TABLET  ON  MONDAYS, WEDNESDAYS AND FRIDAYS 135 tablet 3   No current facility-administered medications for this visit.     Allergies  Allergen Reactions  . Bee Venom Shortness Of Breath    BEE STINGS - dizziness   . Corn-Containing Products Anaphylaxis    Corn bread, pop corn, all corn products  .  Codeine     REACTION: muscle cramps    Review of Systems negative except from HPI and PMH  Physical Exam BP 124/82   Pulse 64   Resp 15   Ht 5\' 10"  (1.778 m)   Wt 229 lb 9.6 oz (104.1 kg)   BMI 32.94 kg/m  Examined at a distance because of bedbug bites  Well developed and nourished in no acute distress HENT normal Neck supple  RRR No Clubbing cyanosis edema Skin-warm and dry multiple bites over the abdomen A & Oriented  Grossly normal sensory and motor function   ECG atrial pacing @ 64 and LBBB    Assessment and  Plan  Sinus node dysfunction  Cardiomyopathy--interval improvement  Pacemaker [employer] Jude The patient's device was interrogated.  The information was reviewed. No changes were made in the programming.     Atrial  fibrillation-paroxysmal  High Risk Medication Surveillance  LBBB  Gait instability   Atrial fib burder 6% about stable  Previously in the 5% range  On Anticoagulation;  No bleeding issues   Tolerating Amio  Need surveillance labs  Gait issue may be amio related   Have asked him to discuss with his PCP to get neuro eval  Cough with pharyngitis and of brief duration is unlikely 2/2 amio lung    We spent more than 50% of our >25 min visit in face to face counseling regarding the above

## 2018-12-25 NOTE — Telephone Encounter (Signed)
Patient is scheduled with Dr. Graciela Husbands on 12/25/18 at 3:00pm.

## 2018-12-25 NOTE — Patient Instructions (Signed)

## 2018-12-26 NOTE — Addendum Note (Signed)
Addended by: Oretha Milch on: 12/26/2018 03:48 PM   Modules accepted: Orders

## 2018-12-28 ENCOUNTER — Ambulatory Visit (INDEPENDENT_AMBULATORY_CARE_PROVIDER_SITE_OTHER): Payer: Medicare HMO | Admitting: Pharmacist

## 2018-12-28 DIAGNOSIS — I480 Paroxysmal atrial fibrillation: Secondary | ICD-10-CM | POA: Diagnosis not present

## 2018-12-28 DIAGNOSIS — Z5181 Encounter for therapeutic drug level monitoring: Secondary | ICD-10-CM | POA: Diagnosis not present

## 2018-12-28 LAB — POCT INR: INR: 2.3 (ref 2.0–3.0)

## 2018-12-28 NOTE — Patient Instructions (Signed)
Description   Continue coumadin 1 1/2 tablets daily except 1 tablet on Mondays, Wednesdays and Fridays Recheck in 6 weeks.     

## 2019-02-08 ENCOUNTER — Ambulatory Visit (INDEPENDENT_AMBULATORY_CARE_PROVIDER_SITE_OTHER): Payer: Medicare HMO | Admitting: *Deleted

## 2019-02-08 ENCOUNTER — Other Ambulatory Visit: Payer: Self-pay

## 2019-02-08 DIAGNOSIS — I480 Paroxysmal atrial fibrillation: Secondary | ICD-10-CM | POA: Diagnosis not present

## 2019-02-08 DIAGNOSIS — Z5181 Encounter for therapeutic drug level monitoring: Secondary | ICD-10-CM

## 2019-02-08 LAB — POCT INR: INR: 2.5 (ref 2.0–3.0)

## 2019-02-08 NOTE — Patient Instructions (Signed)
Continue coumadin 1 1/2  tablets daily except 1 tablet on Mondays, Wednesdays and Fridays  Recheck in 6 weeks 

## 2019-03-13 ENCOUNTER — Ambulatory Visit (INDEPENDENT_AMBULATORY_CARE_PROVIDER_SITE_OTHER): Payer: Medicare HMO | Admitting: *Deleted

## 2019-03-13 ENCOUNTER — Other Ambulatory Visit: Payer: Self-pay

## 2019-03-13 DIAGNOSIS — I480 Paroxysmal atrial fibrillation: Secondary | ICD-10-CM | POA: Diagnosis not present

## 2019-03-13 LAB — CUP PACEART REMOTE DEVICE CHECK
Battery Remaining Longevity: 109 mo
Battery Remaining Percentage: 95.5 %
Battery Voltage: 3.01 V
Brady Statistic AP VP Percent: 1 %
Brady Statistic AP VS Percent: 77 %
Brady Statistic AS VP Percent: 1 %
Brady Statistic AS VS Percent: 22 %
Brady Statistic RA Percent Paced: 72 %
Brady Statistic RV Percent Paced: 1 %
Date Time Interrogation Session: 20200414060029
Implantable Lead Implant Date: 20001120
Implantable Lead Implant Date: 20001120
Implantable Lead Location: 753859
Implantable Lead Location: 753860
Implantable Pulse Generator Implant Date: 20171220
Lead Channel Impedance Value: 250 Ohm
Lead Channel Impedance Value: 440 Ohm
Lead Channel Pacing Threshold Amplitude: 0.625 V
Lead Channel Pacing Threshold Amplitude: 1 V
Lead Channel Pacing Threshold Pulse Width: 0.5 ms
Lead Channel Pacing Threshold Pulse Width: 0.5 ms
Lead Channel Sensing Intrinsic Amplitude: 5 mV
Lead Channel Sensing Intrinsic Amplitude: 7.9 mV
Lead Channel Setting Pacing Amplitude: 1.625
Lead Channel Setting Pacing Amplitude: 2.5 V
Lead Channel Setting Pacing Pulse Width: 0.5 ms
Lead Channel Setting Sensing Sensitivity: 2 mV
Pulse Gen Model: 2272
Pulse Gen Serial Number: 7983565

## 2019-03-20 ENCOUNTER — Encounter: Payer: Self-pay | Admitting: Cardiology

## 2019-03-20 NOTE — Progress Notes (Signed)
Remote pacemaker transmission.   

## 2019-03-21 ENCOUNTER — Telehealth: Payer: Self-pay | Admitting: *Deleted

## 2019-03-21 NOTE — Telephone Encounter (Signed)
° °  COVID-19 Pre-Screening Questions: ° °• Do you currently have a fever? °•  °• Have you recently travelled on a cruise, internationally, or to [place], [place], [place], [place], [place], or [place], [place] ([employer]) ?  °•  °• Have you been in contact with someone that is currently pending confirmation of Covid19 testing or has been confirmed to have the Covid19 virus?  °•  °Are you currently experiencing fatigue or cough?  ° °

## 2019-04-10 ENCOUNTER — Telehealth: Payer: Self-pay | Admitting: *Deleted

## 2019-04-10 NOTE — Telephone Encounter (Signed)
The patient verbally consented for a telehealth phone visit with [facility] and understands that his/her insurance company will be billed for the encounter.  Patient asked to have weight & vitals prior to phone call.

## 2019-04-12 NOTE — Progress Notes (Signed)
Virtual Visit via Telephone Note   This visit type was conducted due to national recommendations for restrictions regarding the COVID-19 Pandemic (e.g. social distancing) in an effort to limit this patient's exposure and mitigate transmission in our community.  Due to his co-morbid illnesses, this patient is at least at moderate risk for complications without adequate follow up.  This format is felt to be most appropriate for this patient at this time.  The patient did not have access to video technology/had technical difficulties with video requiring transitioning to audio format only (telephone).  All issues noted in this document were discussed and addressed.  No physical exam could be performed with this format.  Please refer to the patient's chart for his  consent to telehealth for [facility].   Date:  04/13/2019   ID:  [name], DOB [birth year], MRN [id number]  Patient Location: Home Provider Location: Office  PCP:  Loman Brooklyn, FNP  Cardiologist:  Rozann Lesches, MD Electrophysiologist:  Virl Axe, MD  Evaluation Performed:  Follow-Up Visit  Chief Complaint:   Cardiac follow-up   History of Present Illness:    [name] is a 78 y.o. male last seen in November 2019.  He did not have video access today and we spoke by phone.  He does not report any palpitations or chest pain.  He mainly complains of back pain and trouble with his memory.  He has not had a recent visit with his PCP.  He sees Dr. Caryl Comes in the device clinic, [place] pacemaker in place.  Office visit from January reviewed.  He remains on Coumadin with follow-up in the anticoagulation clinic.  Last INR was 2.5.  We discussed follow-up lab work on amiodarone by the time of our next visit, I did ask him to see his PCP in the interim.  The patient does not have symptoms concerning for COVID-19 infection (fever, chills, cough, or new shortness of breath).    Past Medical History:  Diagnosis  Date  . Asthma   . Bronchitis   . 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 History of epididymitis   . History of recurrent UTIs   . History of ruptured appendix   . Paroxysmal atrial fibrillation (HCC)   . Peptic ulcer disease   . Tachy-brady syndrome ([place])    PPM (SJM) implanted by Dr Caryl Comes 10/19/99, gen change 04/03/08   Past Surgical History:  Procedure Laterality Date  . EP IMPLANTABLE DEVICE [name] 11/17/2016   Procedure: PPM Generator Changeout;  Surgeon: Evans Lance, MD;  Location: [place] [facility];  Service: Cardiovascular;  Laterality: [name];  . PACEMAKER PLACEMENT  03/2008   [employer] Jude, [place]. 5826     Current Meds  Medication Sig  . acetaminophen (TYLENOL) 325 MG tablet Take 650 mg by mouth as needed (back pain).   Marland Kitchen amiodarone (PACERONE) 200 MG tablet TAKE 1/2 (ONE-HALF) TABLET BY MOUTH ONCE DAILY.  . calcium carbonate (TUMS - DOSED IN MG ELEMENTAL CALCIUM) 500 MG chewable tablet Chew 2 tablets by mouth daily as needed for indigestion or heartburn.  . Homeopathic Products (ALLERGY MEDICINE PO) Take 1 tablet by mouth as needed (allergies).   . metoprolol tartrate (LOPRESSOR) 25 MG tablet Take 1 tablet (25 mg total) by mouth 2 (two) times daily.  . sertraline (ZOLOFT) 50 MG tablet Take 50 mg by mouth daily.   Marland Kitchen warfarin (COUMADIN) 2.5 MG tablet TAKE 1 & 1/2 (ONE & ONE-HALF) TABLETS  BY MOUTH ONCE DAILY EXCEPT  1  TABLET  ON  MONDAYS, WEDNESDAYS AND FRIDAYS     Allergies:   Bee venom; Corn-containing products; and Codeine   Social History   Tobacco Use  . Smoking status: Former Smoker    Packs/day: 1.50    Years: 20.00    Pack years: 30.00    Types: Cigarettes    Start date: 08/20/1959    Last attempt to quit: 11/30/1977    Years since quitting: 41.3  . Smokeless tobacco: Never Used  Substance Use Topics  . Alcohol use: No    Alcohol/week: 0.0 standard drinks  . Drug use: No     Family Hx: The patient's family history includes Cancer in his  father; Suicidality in his mother.  ROS:   Please see the history of present illness.    All other systems reviewed and are negative.   Prior CV studies:   The following studies were reviewed today:  Echocardiogram 06/16/2016: Study Conclusions  - Left ventricle: The cavity size was normal. Wall thickness was increased increased in a pattern of mild to moderate LVH. Systolic function was mildly to moderately reduced. The estimated ejection fraction was in the range of 40% to 45%. Diffuse hypokinesis. Doppler parameters are consistent with abnormal left ventricular relaxation (grade 1 diastolic dysfunction). - Ventricular septum: Septal motion showed abnormal function and dyssynergy. - Aortic valve: Trileaflet; mildly calcified leaflets. - Mitral valve: Calcified annulus. There was trivial regurgitation. - Right ventricle: Pacer wire or catheter noted in right ventricle. - Right atrium: The atrium was mildly dilated. Central venous pressure (est): 3 mm Hg. - Atrial septum: No defect or patent foramen ovale was identified. - Tricuspid valve: There was trivial regurgitation. - Pulmonary arteries: PA peak pressure: 23 mm Hg (S). - Pericardium, extracardiac: There was no pericardial effusion.  Impressions:  - Mild to moderate LVH with LVEF 40-45%. There is diffuse hypokinesis with septal dyssynergy. Grade 1 diastolic dysfunction with intermediate LV filling pressure. MAC with trivial mitral regurgitation. Device wire noted within the right heart. Mildly dilated right atrium. Trivial tricuspid regurgitation with normal estimated PASP 23 mmHg.  Labs/Other Tests and Data Reviewed:    EKG:  An ECG dated 12/25/2018 was personally reviewed today and demonstrated:  Sinus rhythm with left bundle branch block.  Recent Labs:  03/20/2018: ALT 22; AST 25; BUN 16; Creatinine, Ser 1.20; Hemoglobin 16.1; Platelets 214; Potassium 4.4; Sodium 141; TSH 2.190  November  2019: Hemoglobin 15.4, platelets 221, BUN 16, creatinine 0.93, potassium 4.6, AST 19, ALT 22, cholesterol 222, triglycerides 180, HDL 43, LDL 143  Wt Readings from Last 3 Encounters:  04/13/19 225 lb (102.1 kg)  12/25/18 229 lb 9.6 oz (104.1 kg)  10/12/18 227 lb (103 kg)     Objective:    Vital Signs:  Ht _0  (1.778 m)   Wt 225 lb (102.1 kg)   BMI 32.28 kg/m    He did not have a way to check his vital signs today. He spoke in complete sentences on the phone, no shortness of breath. Voice tone and speech pattern were normal. No audible wheezing.  ASSESSMENT & PLAN:    1.  Paroxysmal atrial fibrillation.  He continues on Coumadin for stroke prophylaxis and is on amiodarone at low dose and Lopressor.  I reviewed his last lab work, he will need follow-up TSH and LFTs around the time of his next visit unless obtained in the interim per PCP.  Last hemoglobin was normal.  2.  Sinus node dysfunction with [employer] 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  He continues to follow with Dr. Caryl Comes.  3.  Tachycardia mediated cardiomyopathy.  Last LVEF 40 to 45% range.  COVID-19 Education: The signs and symptoms of COVID-19 were discussed with the patient and how to seek care for testing (follow up with PCP or arrange E-visit).  The importance of social distancing was discussed today.  Time:   Today, I have spent 8 minutes with the patient with telehealth technology discussing the above problems.     Medication Adjustments/Labs and Tests Ordered: Current medicines are reviewed at length with the patient today.  Concerns regarding medicines are outlined above.   Tests Ordered: No orders of the defined types were placed in this encounter.   Medication Changes: No orders of the defined types were placed in this encounter.   Disposition:  Follow up 6 months in the [place] office.  Signed, Rozann Lesches, MD  04/13/2019 11:26 AM    [place]

## 2019-04-13 ENCOUNTER — Encounter: Payer: Self-pay | Admitting: Cardiology

## 2019-04-13 ENCOUNTER — Telehealth (INDEPENDENT_AMBULATORY_CARE_PROVIDER_SITE_OTHER): Payer: Medicare HMO | Admitting: Cardiology

## 2019-04-13 VITALS — Ht 70.0 in | Wt 225.0 lb

## 2019-04-13 DIAGNOSIS — I480 Paroxysmal atrial fibrillation: Secondary | ICD-10-CM

## 2019-04-13 DIAGNOSIS — Z7189 Other specified counseling: Secondary | ICD-10-CM

## 2019-04-13 DIAGNOSIS — I495 Sick sinus syndrome: Secondary | ICD-10-CM

## 2019-04-13 DIAGNOSIS — I429 Cardiomyopathy, unspecified: Secondary | ICD-10-CM

## 2019-04-13 DIAGNOSIS — Z950 Presence of cardiac pacemaker: Secondary | ICD-10-CM | POA: Diagnosis not present

## 2019-04-13 NOTE — Patient Instructions (Addendum)

## 2019-05-03 ENCOUNTER — Ambulatory Visit (INDEPENDENT_AMBULATORY_CARE_PROVIDER_SITE_OTHER): Payer: Medicare HMO | Admitting: *Deleted

## 2019-05-03 DIAGNOSIS — I480 Paroxysmal atrial fibrillation: Secondary | ICD-10-CM | POA: Diagnosis not present

## 2019-05-03 DIAGNOSIS — Z5181 Encounter for therapeutic drug level monitoring: Secondary | ICD-10-CM

## 2019-05-03 LAB — POCT INR: INR: 3 (ref 2.0–3.0)

## 2019-05-03 NOTE — Patient Instructions (Signed)
Continue coumadin 1 1/2  tablets daily except 1 tablet on Mondays, Wednesdays and Fridays  Recheck in 6 weeks 

## 2019-06-12 ENCOUNTER — Encounter: Payer: Medicare HMO | Admitting: *Deleted

## 2019-06-13 ENCOUNTER — Telehealth: Payer: Self-pay

## 2019-06-13 NOTE — Telephone Encounter (Addendum)
When we reached out to patient to remind of missed transmission he said "my phone is messed up, I can't hear you but if you can hear me please send help" then stated his address.  I called [facility] emergency services and asked them to do a welfare check on the patient.  He would not answer any questions from me, I am not sure he could hear me talking.    Chanetta Marshall, NP 06/13/2019 9:56 AM

## 2019-06-13 NOTE — Telephone Encounter (Addendum)
See note from [place]

## 2019-06-14 ENCOUNTER — Ambulatory Visit (INDEPENDENT_AMBULATORY_CARE_PROVIDER_SITE_OTHER): Payer: Medicare HMO | Admitting: *Deleted

## 2019-06-14 DIAGNOSIS — Z5181 Encounter for therapeutic drug level monitoring: Secondary | ICD-10-CM

## 2019-06-14 DIAGNOSIS — I480 Paroxysmal atrial fibrillation: Secondary | ICD-10-CM

## 2019-06-14 LAB — POCT INR: INR: 3.4 — AB (ref 2.0–3.0)

## 2019-06-14 NOTE — Patient Instructions (Signed)
Hold coumadin tonight then resume 1 1/2 tablets daily except 1 tablet on Mondays, Wednesdays and Fridays Recheck in 6 weeks

## 2019-06-15 NOTE — Telephone Encounter (Signed)
thnaks   What a time

## 2019-06-18 ENCOUNTER — Ambulatory Visit (INDEPENDENT_AMBULATORY_CARE_PROVIDER_SITE_OTHER): Payer: Medicare HMO | Admitting: *Deleted

## 2019-06-18 DIAGNOSIS — I495 Sick sinus syndrome: Secondary | ICD-10-CM

## 2019-06-18 LAB — CUP PACEART REMOTE DEVICE CHECK
Date Time Interrogation Session: 20200720182752
Implantable Lead Implant Date: 20001120
Implantable Lead Implant Date: 20001120
Implantable Lead Location: 753859
Implantable Lead Location: 753860
Implantable Pulse Generator Implant Date: 20171220
Pulse Gen Model: 2272
Pulse Gen Serial Number: 7983565

## 2019-06-28 ENCOUNTER — Telehealth: Payer: Self-pay | Admitting: Nurse Practitioner

## 2019-06-28 NOTE — Telephone Encounter (Signed)
[place] alert for increasing AF burden. V rates relatively well controlled. On Warfarin. Will ask AF clinic to reach out to patient for telehealth visit.     Chanetta Marshall, NP 06/28/2019 7:59 AM

## 2019-07-04 ENCOUNTER — Encounter: Payer: Self-pay | Admitting: Cardiology

## 2019-07-04 NOTE — Progress Notes (Signed)
Remote pacemaker transmission.   

## 2019-07-04 NOTE — Telephone Encounter (Signed)
thanks

## 2019-07-05 ENCOUNTER — Encounter (HOSPITAL_COMMUNITY): Payer: Self-pay | Admitting: *Deleted

## 2019-07-05 NOTE — Telephone Encounter (Signed)
Lm and letter sent

## 2019-07-19 ENCOUNTER — Ambulatory Visit (HOSPITAL_COMMUNITY)
Admission: RE | Admit: 2019-07-19 | Discharge: 2019-07-19 | Disposition: A | Discharge status: Home / Self Care | Payer: Medicare HMO | Source: Ambulatory Visit | Attending: Nurse Practitioner | Admitting: Nurse Practitioner

## 2019-07-19 ENCOUNTER — Other Ambulatory Visit: Payer: Self-pay

## 2019-07-19 DIAGNOSIS — I480 Paroxysmal atrial fibrillation: Secondary | ICD-10-CM

## 2019-07-19 NOTE — Progress Notes (Signed)
Primary Care Physician: Gwenlyn Fudge, FNP Referring Physician: Gypsy Balsam, NP EP: Dr. Graciela Husbands  Today's appointment is  by a  virtual telephone visit secondary to current covid restrictions and pt preference.    [name] is a 78 y.o. male with a h/o paroxysmal afib, S/P PPM for tachy/brady syndrome. He is being referred for mild up tick of afib burden from  5.8 % to  6.4%. by devise interrogation.  He reports that he has not noted any irregularity with his heart rhythm.He feels at his usual baseline. He continues on amiodarone 1/2 tab a day, metoprolol 25 mg bid and warfarin.   Today, he denies symptoms of palpitations, chest pain, shortness of breath, orthopnea, PND, lower extremity edema, dizziness, presyncope, syncope, or neurologic sequela. The patient is tolerating medications without difficulties and is otherwise without complaint today.   Past Medical History:  Diagnosis Date  . Asthma   . Bronchitis   . 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 History of epididymitis   . History of recurrent UTIs   . History of ruptured appendix   . Paroxysmal atrial fibrillation (HCC)   . Peptic ulcer disease   . Tachy-brady syndrome (HCC)    PPM (SJM) implanted by Dr Graciela Husbands 10/19/99, gen change 04/03/08   Past Surgical History:  Procedure Laterality Date  . EP IMPLANTABLE DEVICE [name] 11/17/2016   Procedure: PPM Generator Changeout;  Surgeon: Marinus Maw, MD;  Location: [facility] INVASIVE CV LAB;  Service: Cardiovascular;  Laterality: [name];  . PACEMAKER PLACEMENT  03/2008   [employer], [place]. 0037    Current Outpatient Medications  Medication Sig Dispense Refill  . acetaminophen (TYLENOL) 325 MG tablet Take 650 mg by mouth as needed (back pain).     Marland Kitchen amiodarone (PACERONE) 200 MG tablet TAKE 1/2 (ONE-HALF) TABLET BY MOUTH ONCE DAILY. 45 tablet 3  . calcium carbonate (TUMS - DOSED IN MG ELEMENTAL CALCIUM) 500 MG chewable tablet Chew 2 tablets by mouth daily as needed  for indigestion or heartburn.    . Homeopathic Products (ALLERGY MEDICINE PO) Take 1 tablet by mouth as needed (allergies).     . metoprolol tartrate (LOPRESSOR) 25 MG tablet Take 1 tablet (25 mg total) by mouth 2 (two) times daily. 180 tablet 3  . sertraline (ZOLOFT) 50 MG tablet Take 50 mg by mouth daily.     Marland Kitchen warfarin (COUMADIN) 2.5 MG tablet TAKE 1 & 1/2 (ONE & ONE-HALF) TABLETS BY MOUTH ONCE DAILY EXCEPT  1  TABLET  ON  MONDAYS, WEDNESDAYS AND FRIDAYS 135 tablet 3   No current facility-administered medications for this encounter.     Allergies  Allergen Reactions  . Bee Venom Shortness Of Breath    BEE STINGS - dizziness   . Corn-Containing Products Anaphylaxis    Corn bread, pop corn, all corn products  . Codeine     REACTION: muscle cramps    Social History   Socioeconomic History  . Marital status: Divorced    Spouse name: Not on file  . Number of children: Not on file  . Years of education: Not on file  . Highest education level: Not on file  Occupational History  . Not on file  Social Needs  . Financial resource strain: Not on file  . Food insecurity    Worry: Not on file    Inability: Not on file  . Transportation needs    Medical: Not on file    Non-medical:  Not on file  Tobacco Use  . Smoking status: Former Smoker    Packs/day: 1.50    Years: 20.00    Pack years: 30.00    Types: Cigarettes    Start date: 08/20/1959    Quit date: 11/30/1977    Years since quitting: 41.6  . Smokeless tobacco: Never Used  Substance and Sexual Activity  . Alcohol use: No    Alcohol/week: 0.0 standard drinks  . Drug use: No  . Sexual activity: Not on file  Lifestyle  . Physical activity    Days per week: Not on file    Minutes per session: Not on file  . Stress: Not on file  Relationships  . Social Herbalist on phone: Not on file    Gets together: Not on file    Attends religious service: Not on file    Active member of club or organization: Not on file     Attends meetings of clubs or organizations: Not on file    Relationship status: Not on file  . Intimate partner violence    Fear of current or ex partner: Not on file    Emotionally abused: Not on file    Physically abused: Not on file    Forced sexual activity: Not on file  Other Topics Concern  . Not on file  Social History Narrative  . Not on file    Family History  Problem Relation Age of Onset  . Suicidality Mother   . Cancer Father     ROS- All systems are reviewed and negative except as per the HPI above  Physical Exam: There were no vitals filed for this visit. Wt Readings from Last 3 Encounters:  04/13/19 102.1 kg  12/25/18 104.1 kg  10/12/18 103 kg    Labs: Lab Results  Component Value Date   NA 141 03/20/2018   K 4.4 03/20/2018   CL 102 03/20/2018   CO2 22 03/20/2018   GLUCOSE 88 03/20/2018   BUN 16 03/20/2018   CREATININE 1.20 03/20/2018   CALCIUM 10.2 03/20/2018   Lab Results  Component Value Date   INR 3.4 (A) 06/14/2019   Lab Results  Component Value Date   CHOL  04/29/2009    150        ATP III CLASSIFICATION:  <200     mg/dL   Desirable  200-239  mg/dL   Borderline High  >=240    mg/dL   High          HDL 25 (L) 04/29/2009   LDLCALC (H) 04/29/2009    114        Total Cholesterol/HDL:CHD Risk Coronary Heart Disease Risk Table                     Men   Women  1/2 Average Risk   3.4   3.3  Average Risk       5.0   4.4  2 X Average Risk   9.6   7.1  3 X Average Risk  23.4   11.0        Use the calculated Patient Ratio above and the CHD Risk Table to determine the patient's CHD Risk.        ATP III CLASSIFICATION (LDL):  <100     mg/dL   Optimal  100-129  mg/dL   Near or Above  Optimal  130-159  mg/dL   Borderline  [id number]  mg/dL   High  >914>190     mg/dL   Very High   TRIG 56 78/29/562106/11/2008     Exam- not done 2/2 phone visit Pt is oriented with appropriate responses to questions He doers not check BP/HR   EKG-not done    Assessment and Plan: 1. Afib Mild increase in up tick of afib burden Pt is not symptomatic with this and does not feel any heart irregularity  Therefore, no change in approach Continue amiodarone 100 mg daily Continue BB at current dose  2. CHA2DS2VASc score of at least 3 Continue warfarin  Pt states compliance  No bleeding issues  F/u with Dr. Graciela HusbandsKlein per recall in October  Lupita Leashonna C. Matthew Folksarroll, ANP-C [facility] [address] [place], [place] [address] [phone]

## 2019-07-26 ENCOUNTER — Other Ambulatory Visit: Payer: Self-pay

## 2019-07-26 ENCOUNTER — Ambulatory Visit (INDEPENDENT_AMBULATORY_CARE_PROVIDER_SITE_OTHER): Payer: Medicare HMO | Admitting: *Deleted

## 2019-07-26 DIAGNOSIS — I480 Paroxysmal atrial fibrillation: Secondary | ICD-10-CM | POA: Diagnosis not present

## 2019-07-26 DIAGNOSIS — Z5181 Encounter for therapeutic drug level monitoring: Secondary | ICD-10-CM | POA: Diagnosis not present

## 2019-07-26 LAB — POCT INR: INR: 3.7 — AB (ref 2.0–3.0)

## 2019-07-26 NOTE — Patient Instructions (Signed)
Hold coumadin tonight then decrease dose to 1 tablet daily except 1 1/2 tablets on Sundays and Thursdays Recheck in 5 weeks

## 2019-08-20 ENCOUNTER — Encounter: Payer: Self-pay | Admitting: Internal Medicine

## 2019-08-20 NOTE — Progress Notes (Signed)
[facility]lin alert received for high V rates during AT/AF episodes. Most recent HVR episode occurred on 08/18/19 at 19:24, duration 4min 46sec, EGM shows AF w/RVR. As of transmission from 08/19/19, AF episode in progress for 10.5 hours. V rates at times >130bpm per HR histograms.   Per AF Clinic notes from 07/19/19, patient asymptomatic with AF episodes, plan to continue warfarin, amiodarone 100mg  daily, and metoprolol tartrate 25mg  BID. Updated PPM data routed to Dr. Caryl Comes in advance of virtual visit on 09/06/19.

## 2019-08-24 NOTE — Progress Notes (Signed)
ntoed

## 2019-08-29 ENCOUNTER — Other Ambulatory Visit: Payer: Self-pay

## 2019-08-29 ENCOUNTER — Ambulatory Visit (INDEPENDENT_AMBULATORY_CARE_PROVIDER_SITE_OTHER): Payer: Medicare HMO | Admitting: *Deleted

## 2019-08-29 DIAGNOSIS — Z5181 Encounter for therapeutic drug level monitoring: Secondary | ICD-10-CM

## 2019-08-29 DIAGNOSIS — I480 Paroxysmal atrial fibrillation: Secondary | ICD-10-CM

## 2019-08-29 LAB — POCT INR: INR: 2.4 (ref 2.0–3.0)

## 2019-08-29 NOTE — Patient Instructions (Signed)
Continue coumadin 1 tablet daily except 1 1/2 tablets on Sundays and Thursdays Recheck in 6 weeks 

## 2019-09-03 ENCOUNTER — Telehealth: Payer: Self-pay | Admitting: Pharmacist

## 2019-09-03 NOTE — Telephone Encounter (Signed)
Called pt to discuss changing from warfarin to DOAC due to better efficacy and safety data, as well as less frequent monitoring, especially given COVID-19 pandemic. Pt prefers to stay on warfarin at this time due to cost. 

## 2019-09-05 ENCOUNTER — Telehealth: Payer: Self-pay | Admitting: *Deleted

## 2019-09-05 NOTE — Telephone Encounter (Signed)
consented 5-12 and meds/allergies reviewed

## 2019-09-06 ENCOUNTER — Telehealth (INDEPENDENT_AMBULATORY_CARE_PROVIDER_SITE_OTHER): Payer: Medicare HMO | Admitting: Internal Medicine

## 2019-09-06 ENCOUNTER — Other Ambulatory Visit: Payer: Self-pay

## 2019-09-06 ENCOUNTER — Encounter: Payer: Self-pay | Admitting: Internal Medicine

## 2019-09-06 VITALS — Ht 70.0 in | Wt 212.0 lb

## 2019-09-06 DIAGNOSIS — I495 Sick sinus syndrome: Secondary | ICD-10-CM

## 2019-09-06 DIAGNOSIS — I480 Paroxysmal atrial fibrillation: Secondary | ICD-10-CM | POA: Diagnosis not present

## 2019-09-06 NOTE — Progress Notes (Signed)
Electrophysiology TeleHealth Note   Due to national recommendations of social distancing due to COVID 19, an audio/video telehealth visit is felt to be most appropriate for this patient at this time. The patient did not have access to video technology/had technical difficulties with video requiring transitioning to audio format only (telephone).  All issues noted in this document were discussed and addressed.  No physical exam could be performed with this format.     See MyChart message from today for the patient's consent to telehealth for [facility].   Date:  09/06/2019   ID:  [name], DOB [birth year], MRN [id number]  Location: patient's home  Provider location: [address], [place]  Evaluation Performed: Follow-up visit  PCP:  Gwenlyn Fudge, FNP  Cardiologist:     Electrophysiologist:  SK   Chief Complaint: afib and cardiomyopathy   History of Present Illness:    [name] is a 78 y.o. male who presents via audio/video conferencing for a telehealth visit today.  Since last being seen in our clinic, the patient reports doing relatively well   No SOB no swelling orchest pain No edema But goes to bed tired, wakes up tired, is tired of being tired     Seen in followup for pacemaker implanted 2000,   gen change 2017 for tachybradycardia syndrome.   He  has paroxysmal atrial fibrillation on amiodarone.   He is anticoagulated with warfarin.   No bleeding   Has had blood work drawn at [facility] but doesn't know what       DATE TEST EF   2013 Echo   30-35 %   7/17 Echo   40-45 %   7/17 Myoview 53% No ischemia reversible defects 2/2 pacing                 Date Cr K TSH Hgb LFTs  12/17 1.09  1.5(1/18) 15.3    4/19 1.20 4.4 2.19 16.1 25  11/19 0.93 4.6  15.4 19   Has intermittent cough, in the throat, mostly non productive-- now about 1 year    The patient denies symptoms of fevers, chills, cough, or new SOB  worrisome for COVID 19.    Past Medical History:  Diagnosis Date  . Asthma   . Bronchitis   . 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 History of epididymitis   . History of recurrent UTIs   . History of ruptured appendix   . Paroxysmal atrial fibrillation (HCC)   . Peptic ulcer disease   . Tachy-brady syndrome (HCC)    PPM (SJM) implanted by Dr Graciela Husbands 10/19/99, gen change 04/03/08    Past Surgical History:  Procedure Laterality Date  . EP IMPLANTABLE DEVICE [name] 11/17/2016   Procedure: PPM Generator Changeout;  Surgeon: Marinus Maw, MD;  Location: [facility] INVASIVE CV LAB;  Service: Cardiovascular;  Laterality: [name];  . PACEMAKER PLACEMENT  03/2008   [employer], [place]. 0258    Current Outpatient Medications  Medication Sig Dispense Refill  . acetaminophen (TYLENOL) 325 MG tablet Take 650 mg by mouth as needed (back pain).     Marland Kitchen amiodarone (PACERONE) 200 MG tablet TAKE 1/2 (ONE-HALF) TABLET BY MOUTH ONCE DAILY. 45 tablet 3  . calcium carbonate (TUMS - DOSED IN MG ELEMENTAL CALCIUM) 500 MG chewable tablet Chew 2 tablets by mouth daily as needed for indigestion or heartburn.    . Homeopathic Products (ALLERGY MEDICINE PO) Take  1 tablet by mouth as needed (allergies).     . metoprolol tartrate (LOPRESSOR) 25 MG tablet Take 1 tablet (25 mg total) by mouth 2 (two) times daily. 180 tablet 3  . sertraline (ZOLOFT) 50 MG tablet Take 50 mg by mouth daily.     Marland Kitchen tiZANidine (ZANAFLEX) 4 MG capsule Take 2 mg by mouth as needed.    . warfarin (COUMADIN) 2.5 MG tablet TAKE 1 & 1/2 (ONE & ONE-HALF) TABLETS BY MOUTH ONCE DAILY EXCEPT  1  TABLET  ON  MONDAYS, WEDNESDAYS AND FRIDAYS 135 tablet 3   No current facility-administered medications for this visit.     Allergies:   Bee venom, Corn-containing products, and Codeine   Social History:  The patient  reports that he quit smoking about 41 years ago. His smoking use included cigarettes. He started smoking about 60 years ago. He has a  30.00 pack-year smoking history. He has never used smokeless tobacco. He reports that he does not drink alcohol or use drugs.   Family History:  The patient's   family history includes Cancer in his father; Suicidality in his mother.   ROS:  Please see the history of present illness.   All other systems are personally reviewed and negative.    Exam:    Vital Signs:  Ht 5\' 10"  (1.778 m)   Wt 212 lb (96.2 kg)   BMI 30.42 kg/m      Labs/Other Tests and Data Reviewed:    Recent Labs: No results found for requested labs within last 8760 hours.   Wt Readings from Last 3 Encounters:  09/06/19 212 lb (96.2 kg)  04/13/19 225 lb (102.1 kg)  12/25/18 229 lb 9.6 oz (104.1 kg)     Other studies personally reviewed:    Last device remote is reviewed from [place] PDF dated 7/20  which reveals normal device function,   arrhythmias -AFIB   ASSESSMENT & PLAN:   Sinus node dysfunction  Cardiomyopathy--interval improvement  Pacemaker St Jude The patient's device was interrogated.  The information was reviewed. No changes were made in the programming.     Atrial fibrillation-paroxysmal  High Risk Medication Surveillance  LBBB  Gait instability  Daytime somnolence  On Anticoagulation;  No bleeding issues   Tolerating amiodarone  Some low burden afib but asymtpomatc  Cough is concerning for amio lung toxicity will reassess at next visit as it is quite upper airway and variable  Will arrange for home sleep study  Will ask him to go to PCP in [place] for Amio survielance labs   Euvolemic continue current meds     COVID 19 screen The patient denies symptoms of COVID 19 at this time.  The importance of social distancing was discussed today.  Follow-up:  22mo  Next remote: As Scheduled   Current medicines are reviewed at length with the patient today.   The patient does not have concerns regarding his medicines.  The following changes were made today:  none  Labs/  tests ordered today include: CBC, CMET TSH to be done at PCP  No orders of the defined types were placed in this encounter.   Future tests ( post COVID )     Patient Risk:  after full review of this patients clinical status, I feel that they are at moderate risk at this time.  Today, I have spent 10  minutes with the patient with telehealth technology discussing the above.  Signed, Virl Axe, MD  09/06/2019 2:28 PM  [place] [address] [phone] (office) [phone] (fax)

## 2019-09-14 LAB — CUP PACEART REMOTE DEVICE CHECK
Battery Remaining Longevity: 108 mo
Battery Remaining Percentage: 95.5 %
Battery Voltage: 2.99 V
Brady Statistic AP VP Percent: 1 %
Brady Statistic AP VS Percent: 81 %
Brady Statistic AS VP Percent: 1 %
Brady Statistic AS VS Percent: 19 %
Brady Statistic RA Percent Paced: 76 %
Brady Statistic RV Percent Paced: 1.1 %
Date Time Interrogation Session: 20201016080014
Implantable Lead Implant Date: 20001120
Implantable Lead Implant Date: 20001120
Implantable Lead Location: 753859
Implantable Lead Location: 753860
Implantable Pulse Generator Implant Date: 20171220
Lead Channel Impedance Value: 250 Ohm
Lead Channel Impedance Value: 430 Ohm
Lead Channel Pacing Threshold Amplitude: 0.625 V
Lead Channel Pacing Threshold Amplitude: 1 V
Lead Channel Pacing Threshold Pulse Width: 0.5 ms
Lead Channel Pacing Threshold Pulse Width: 0.5 ms
Lead Channel Sensing Intrinsic Amplitude: 5 mV
Lead Channel Sensing Intrinsic Amplitude: 8.2 mV
Lead Channel Setting Pacing Amplitude: 1.625
Lead Channel Setting Pacing Amplitude: 2.5 V
Lead Channel Setting Pacing Pulse Width: 0.5 ms
Lead Channel Setting Sensing Sensitivity: 2 mV
Pulse Gen Model: 2272
Pulse Gen Serial Number: 7983565

## 2019-09-17 ENCOUNTER — Ambulatory Visit (INDEPENDENT_AMBULATORY_CARE_PROVIDER_SITE_OTHER): Payer: Medicare HMO | Admitting: *Deleted

## 2019-09-17 DIAGNOSIS — I495 Sick sinus syndrome: Secondary | ICD-10-CM

## 2019-09-17 DIAGNOSIS — I480 Paroxysmal atrial fibrillation: Secondary | ICD-10-CM

## 2019-09-17 LAB — CUP PACEART REMOTE DEVICE CHECK
Battery Remaining Longevity: 108 mo
Battery Remaining Percentage: 95.5 %
Battery Voltage: 2.99 V
Brady Statistic AP VP Percent: 1 %
Brady Statistic AP VS Percent: 81 %
Brady Statistic AS VP Percent: 1 %
Brady Statistic AS VS Percent: 19 %
Brady Statistic RA Percent Paced: 76 %
Brady Statistic RV Percent Paced: 1.1 %
Date Time Interrogation Session: 20201016080014
Implantable Lead Implant Date: 20001120
Implantable Lead Implant Date: 20001120
Implantable Lead Location: 753859
Implantable Lead Location: 753860
Implantable Pulse Generator Implant Date: 20171220
Lead Channel Impedance Value: 250 Ohm
Lead Channel Impedance Value: 430 Ohm
Lead Channel Pacing Threshold Amplitude: 0.625 V
Lead Channel Pacing Threshold Amplitude: 1 V
Lead Channel Pacing Threshold Pulse Width: 0.5 ms
Lead Channel Pacing Threshold Pulse Width: 0.5 ms
Lead Channel Sensing Intrinsic Amplitude: 5 mV
Lead Channel Sensing Intrinsic Amplitude: 8.2 mV
Lead Channel Setting Pacing Amplitude: 1.625
Lead Channel Setting Pacing Amplitude: 2.5 V
Lead Channel Setting Pacing Pulse Width: 0.5 ms
Lead Channel Setting Sensing Sensitivity: 2 mV
Pulse Gen Model: 2272
Pulse Gen Serial Number: 7983565

## 2019-10-09 NOTE — Progress Notes (Signed)
Remote pacemaker transmission.   

## 2019-10-16 ENCOUNTER — Encounter: Payer: Self-pay | Admitting: Cardiology

## 2019-10-16 ENCOUNTER — Ambulatory Visit (INDEPENDENT_AMBULATORY_CARE_PROVIDER_SITE_OTHER): Payer: Medicare HMO | Admitting: *Deleted

## 2019-10-16 ENCOUNTER — Other Ambulatory Visit: Payer: Self-pay

## 2019-10-16 ENCOUNTER — Ambulatory Visit (INDEPENDENT_AMBULATORY_CARE_PROVIDER_SITE_OTHER): Payer: Medicare HMO | Admitting: Family Medicine

## 2019-10-16 VITALS — BP 137/77 | HR 60 | Ht 70.0 in | Wt 229.4 lb

## 2019-10-16 DIAGNOSIS — Z5181 Encounter for therapeutic drug level monitoring: Secondary | ICD-10-CM | POA: Diagnosis not present

## 2019-10-16 DIAGNOSIS — I480 Paroxysmal atrial fibrillation: Secondary | ICD-10-CM | POA: Diagnosis not present

## 2019-10-16 DIAGNOSIS — I428 Other cardiomyopathies: Secondary | ICD-10-CM

## 2019-10-16 DIAGNOSIS — Z79899 Other long term (current) drug therapy: Secondary | ICD-10-CM

## 2019-10-16 LAB — POCT INR: INR: 2.3 (ref 2.0–3.0)

## 2019-10-16 NOTE — Patient Instructions (Addendum)
Medication Instructions:    Your physician recommends that you continue on your current medications as directed. Please refer to the Current Medication list given to you today.  Labwork:  Your physician recommends that you return for non-fasting lab work in: as soon as possible to check your liver functioning and you thyroid. Please have this done at [facility].   Testing/Procedures:  NONE  Follow-Up:  Your physician recommends that you schedule a follow-up appointment in: 6 months. You will receive a reminder letter in the mail in about 4 months reminding you to call and schedule your appointment. If you don't receive this letter, please contact our office.  Any Other Special Instructions Will Be Listed Below (If Applicable).  If you need a refill on your cardiac medications before your next appointment, please call your pharmacy.

## 2019-10-16 NOTE — Patient Instructions (Signed)
Continue coumadin 1 tablet daily except 1 1/2 tablets on Sundays and Thursdays Recheck in 6 weeks 

## 2019-10-16 NOTE — Progress Notes (Signed)
Cardiology Office Note  Date: 10/16/2019   ID: [name], DOB [birth year], MRN [id number]  PCP:  Patient, No Pcp Per  Cardiologist:  Rozann Lesches, MD Electrophysiologist:  None   Chief Complaint  Patient presents with  . Follow-up    Paroxysmal atrial fibrillation/tachybradycardia syndrome status post pacemaker placement    History of Present Illness: [name] is a 78 y.o. male with a history of paroxysmal atrial fibrillation on Coumadin and amiodarone.  History of tachybradycardia syndrome.  History of tachybradycardia syndrome with previous placement of  pacemaker and subsequent generator change in 2009.  Recent remote pacemaker check was normal.  Patient denies any recent acute illnesses, hospitalizations, surgeries, tick bite, or travels.  Does complain of occasional dizziness while sitting on the commode at home.  Denies any sensation of near syncope when this occurs.  States the sensation is sporadic.  Denies any sensation of palpitations or arrhythmias.  Denies any sensation of heart racing or fluttering.  States there has been no changes in medication therapy since previous visit.   Past Medical History:  Diagnosis Date  . Asthma   . Bronchitis   . Cardiomyopathy ([place])    Possibly tachycardia mediated  . History of epididymitis   . History of recurrent UTIs   . History of ruptured appendix   . Paroxysmal atrial fibrillation (HCC)   . Peptic ulcer disease   . Tachy-brady syndrome ([place])    PPM (SJM) implanted by Dr Caryl Comes 10/19/99, gen change 04/03/08    Past Surgical History:  Procedure Laterality Date  . EP IMPLANTABLE DEVICE [name] 11/17/2016   Procedure: PPM Generator Changeout;  Surgeon: Evans Lance, MD;  Location: [place] CV LAB;  Service: Cardiovascular;  Laterality: [name];  . PACEMAKER PLACEMENT  03/2008   [employer], [place]. 5826    Current Outpatient Medications  Medication Sig Dispense Refill  . acetaminophen (TYLENOL) 325 MG  tablet Take 650 mg by mouth as needed (back pain).     Marland Kitchen amiodarone (PACERONE) 200 MG tablet TAKE 1/2 (ONE-HALF) TABLET BY MOUTH ONCE DAILY. 45 tablet 3  . calcium carbonate (TUMS - DOSED IN MG ELEMENTAL CALCIUM) 500 MG chewable tablet Chew 2 tablets by mouth daily as needed for indigestion or heartburn.    . Homeopathic Products (ALLERGY MEDICINE PO) Take 1 tablet by mouth as needed (allergies).     . metoprolol tartrate (LOPRESSOR) 25 MG tablet Take 1 tablet (25 mg total) by mouth 2 (two) times daily. 180 tablet 3  . sertraline (ZOLOFT) 50 MG tablet Take 50 mg by mouth daily.     Marland Kitchen tiZANidine (ZANAFLEX) 4 MG capsule Take 2 mg by mouth as needed.    . warfarin (COUMADIN) 2.5 MG tablet TAKE 1 & 1/2 (ONE & ONE-HALF) TABLETS BY MOUTH ONCE DAILY EXCEPT  1  TABLET  ON  MONDAYS, WEDNESDAYS AND FRIDAYS 135 tablet 3   No current facility-administered medications for this visit.    Allergies:  Bee venom, Corn-containing products, and Codeine   Social History: The patient  reports that he quit smoking about 41 years ago. His smoking use included cigarettes. He started smoking about 60 years ago. He has a 30.00 pack-year smoking history. He has never used smokeless tobacco. He reports that he does not drink alcohol or use drugs.   Family History: The patient's family history includes Cancer in his father; Suicidality in his mother.   ROS:  Please see the history of present illness. Otherwise, complete  review of systems is positive for none.  All other systems are reviewed and negative.   Physical Exam: VS:  BP 137/77   Pulse 60   Ht _0  (1.778 m)   Wt 229 lb 6.4 oz (104.1 kg)   SpO2 95%   BMI 32.92 kg/m , BMI Body mass index is 32.92 kg/m.  Wt Readings from Last 3 Encounters:  10/16/19 229 lb 6.4 oz (104.1 kg)  09/06/19 212 lb (96.2 kg)  04/13/19 225 lb (102.1 kg)    Lungs: Clear to auscultation, nonlabored breathing at rest. Cardiac: Regular rate and rhythm, no S3 or significant systolic  murmur, no pericardial rub. Extremities: No pitting edema, distal pulses 2+. Skin: Warm and dry. Musculoskeletal: No kyphosis. Neuropsychiatric: Alert and oriented x3, affect grossly appropriate.  ECG:  An ECG dated 10/16/2019 was personally reviewed today and demonstrated:  Sinus rhythm with a rate of 60 left bundle branch block  Recent Labwork: No results found for requested labs within last 8760 hours.     Component Value Date/Time   CHOL  04/29/2009 0446    150        ATP III CLASSIFICATION:  <200     mg/dL   Desirable  200-239  mg/dL   Borderline High  >=240    mg/dL   High          TRIG 56 04/29/2009 0446   HDL 25 (L) 04/29/2009 0446   CHOLHDL 6.0 04/29/2009 0446   VLDL 11 04/29/2009 0446   LDLCALC (H) 04/29/2009 0446    114        Total Cholesterol/HDL:CHD Risk Coronary Heart Disease Risk Table                     Men   Women  1/2 Average Risk   3.4   3.3  Average Risk       5.0   4.4  2 X Average Risk   9.6   7.1  3 X Average Risk  23.4   11.0        Use the calculated Patient Ratio above and the CHD Risk Table to determine the patient's CHD Risk.        ATP III CLASSIFICATION (LDL):  <100     mg/dL   Optimal  100-129  mg/dL   Near or Above                    Optimal  130-159  mg/dL   Borderline  160-189  mg/dL   High  >190     mg/dL   Very High   LDLDIRECT 147.9 08/30/2008 0842    Other Studies Reviewed Today: Echocardiogram June 16, 2016 Impressions:  - Mild to moderate LVH with LVEF 40-45%. There is diffuse   hypokinesis with septal dyssynergy. Grade 1 diastolic dysfunction   with intermediate LV filling pressure. MAC with trivial mitral   regurgitation. Device wire noted within the right heart. Mildly   dilated right atrium. Trivial tricuspid regurgitation with normal   estimated PASP 23 mmHg.  Pacemaker remote device check September 29, 2019 Device remote reviewed.  Remote is normal. Battery status is good. Lead measurements unchanged.  Histograms are appropriate  Assessment and Plan:  1.  Paroxysmal atrial fibrillation: Patient denies any recent palpitations or arrhythmias.  EKG today shows normal sinus rhythm with a rate of 60.  A left bundle branch block.  Continue amiodarone 100 mg daily and Coumadin as directed.  INR today was 2.3.  2.  Cardiomyopathy: Patient has tachycardia mediated cardiomyopathy.  Last ejection fraction was 40 to 45% on echo.  Patient is asymptomatic with no significant dyspnea or lower extremity edema.  No reason for follow-up echo at this point.  Patient is on appropriate medication regimen including metoprolol 25 mg p.o. twice daily.   Medication Adjustments/Labs and Tests Ordered: Current medicines are reviewed at length with the patient today.  Concerns regarding medicines are outlined above.    Patient Instructions  Medication Instructions:    Your physician recommends that you continue on your current medications as directed. Please refer to the Current Medication list given to you today.  Labwork:  Your physician recommends that you return for non-fasting lab work in: as soon as possible to check your liver functioning and you thyroid. Please have this done at [facility].   Testing/Procedures:  NONE  Follow-Up:  Your physician recommends that you schedule a follow-up appointment in: 6 months. You will receive a reminder letter in the mail in about 4 months reminding you to call and schedule your appointment. If you don't receive this letter, please contact our office.  Any Other Special Instructions Will Be Listed Below (If Applicable).  If you need a refill on your cardiac medications before your next appointment, please call your pharmacy.        Signed, Levell July, NP 10/16/2019 4:47 PM    [facility] [place], [place], [place] [address] Phone: [phone]; Fax: [phone]

## 2019-10-30 ENCOUNTER — Encounter: Payer: Self-pay | Admitting: *Deleted

## 2019-11-14 ENCOUNTER — Telehealth: Payer: Self-pay | Admitting: *Deleted

## 2019-11-14 NOTE — Telephone Encounter (Signed)
-----   Message from Satira Sark, MD sent at 11/14/2019  3:38 PM EST ----- Results reviewed.  Lab work from PCP reviewed.  Renal function and LFTs are normal.  Hemoglobin normal.  TSH normal.  Continue with current medications.

## 2019-11-14 NOTE — Telephone Encounter (Signed)
-----   Message from Satira Sark, MD sent at 11/13/2019  5:04 PM EST ----- Results reviewed.  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  Recommended follow-up is in 5 years for reimaging.

## 2019-11-14 NOTE — Telephone Encounter (Signed)
Patient informed. 

## 2019-11-27 ENCOUNTER — Ambulatory Visit (INDEPENDENT_AMBULATORY_CARE_PROVIDER_SITE_OTHER): Payer: Medicare HMO | Admitting: *Deleted

## 2019-11-27 ENCOUNTER — Other Ambulatory Visit: Payer: Self-pay

## 2019-11-27 DIAGNOSIS — Z5181 Encounter for therapeutic drug level monitoring: Secondary | ICD-10-CM

## 2019-11-27 DIAGNOSIS — I480 Paroxysmal atrial fibrillation: Secondary | ICD-10-CM | POA: Diagnosis not present

## 2019-11-27 LAB — POCT INR: INR: 2.2 (ref 2.0–3.0)

## 2019-11-27 NOTE — Patient Instructions (Signed)
Continue coumadin 1 tablet daily except 1 1/2 tablets on Sundays and Thursdays Recheck in 6 weeks 

## 2019-12-17 ENCOUNTER — Ambulatory Visit (INDEPENDENT_AMBULATORY_CARE_PROVIDER_SITE_OTHER): Payer: Medicare HMO | Admitting: *Deleted

## 2019-12-17 DIAGNOSIS — I495 Sick sinus syndrome: Secondary | ICD-10-CM

## 2019-12-17 LAB — CUP PACEART REMOTE DEVICE CHECK
Battery Remaining Longevity: 108 mo
Battery Remaining Percentage: 95.5 %
Battery Voltage: 3.01 V
Brady Statistic AP VP Percent: 1 %
Brady Statistic AP VS Percent: 81 %
Brady Statistic AS VP Percent: 1 %
Brady Statistic AS VS Percent: 19 %
Brady Statistic RA Percent Paced: 75 %
Brady Statistic RV Percent Paced: 1.2 %
Date Time Interrogation Session: 20210118020015
Implantable Lead Implant Date: 20001120
Implantable Lead Implant Date: 20001120
Implantable Lead Location: 753859
Implantable Lead Location: 753860
Implantable Pulse Generator Implant Date: 20171220
Lead Channel Impedance Value: 240 Ohm
Lead Channel Impedance Value: 450 Ohm
Lead Channel Pacing Threshold Amplitude: 0.5 V
Lead Channel Pacing Threshold Amplitude: 1 V
Lead Channel Pacing Threshold Pulse Width: 0.5 ms
Lead Channel Pacing Threshold Pulse Width: 0.5 ms
Lead Channel Sensing Intrinsic Amplitude: 5 mV
Lead Channel Sensing Intrinsic Amplitude: 8.2 mV
Lead Channel Setting Pacing Amplitude: 1.5 V
Lead Channel Setting Pacing Amplitude: 2.5 V
Lead Channel Setting Pacing Pulse Width: 0.5 ms
Lead Channel Setting Sensing Sensitivity: 2 mV
Pulse Gen Model: 2272
Pulse Gen Serial Number: 7983565

## 2019-12-18 NOTE — Progress Notes (Signed)
PPM remote 

## 2019-12-24 ENCOUNTER — Other Ambulatory Visit: Payer: Self-pay | Admitting: Internal Medicine

## 2019-12-26 NOTE — Telephone Encounter (Signed)
This a [place] pt

## 2019-12-31 ENCOUNTER — Telehealth: Payer: Self-pay | Admitting: Emergency Medicine

## 2019-12-31 NOTE — Telephone Encounter (Signed)
Called patient du to alerts received for AF with RVR on 12/28/19 that lasted  1 hr and 29 min with ave v-rate of 165 bpm. AF has been ongoing since 12/28/19 @ 1601 on 12/29/19 transmission. + OAC.Patient reports that he has had increased fatigue over the past 3 days. Reports that he has bronchitis and does not wear a mask very often due to the fact that he reports he can not tolerate wearing a mask. Suggested that patient contact his PCP concerning bronchitis. Patient unable to send remote transmission because he is not at home. Will call office tomorrow morning after 8:30 AM to get assistance with sending remote transmission to confirm if he is is sin AF. No CP , c/o sharp intermittent pains originating in left axillary region to left hip. Reports he has missed doses of amiodorone  and metoprolol in the past 2 weeks.   ED precautions given.

## 2020-01-01 NOTE — Telephone Encounter (Signed)
I help the pt send a manual transmission. I told him the nurse will review it and give him a call back.

## 2020-01-01 NOTE — Telephone Encounter (Signed)
Atrialfibrilllation burden seems stable continue amiodarone ? Was I missing something? Thanks SK

## 2020-01-01 NOTE — Telephone Encounter (Signed)
Transmission from 01/01/20 reviewed. Presenting rhythm AS/VS 60s. Back in SR as of 12/30/19.  Spoke with patient, advised of findings. He reports he is back on schedule with amiodarone and metoprolol after some recent missed doses. Reports compliance with warfarin. Reports he has had more energy in the past couple of days. Advised will forward information to Dr. Graciela Husbands for review. Will call back if any new recommendations. Pt verbalizes understanding and agreement with plan.

## 2020-01-08 ENCOUNTER — Ambulatory Visit (INDEPENDENT_AMBULATORY_CARE_PROVIDER_SITE_OTHER): Payer: Medicare HMO | Admitting: *Deleted

## 2020-01-08 ENCOUNTER — Other Ambulatory Visit: Payer: Self-pay

## 2020-01-08 DIAGNOSIS — I480 Paroxysmal atrial fibrillation: Secondary | ICD-10-CM | POA: Diagnosis not present

## 2020-01-08 DIAGNOSIS — Z5181 Encounter for therapeutic drug level monitoring: Secondary | ICD-10-CM | POA: Diagnosis not present

## 2020-01-08 LAB — POCT INR: INR: 2.5 (ref 2.0–3.0)

## 2020-01-08 NOTE — Patient Instructions (Signed)
Continue coumadin 1 tablet daily except 1 1/2 tablets on Sundays and Thursdays Recheck in 6 weeks

## 2020-01-18 ENCOUNTER — Other Ambulatory Visit: Payer: Self-pay | Admitting: Cardiology

## 2020-02-19 ENCOUNTER — Ambulatory Visit (INDEPENDENT_AMBULATORY_CARE_PROVIDER_SITE_OTHER): Payer: Medicare HMO | Admitting: *Deleted

## 2020-02-19 ENCOUNTER — Other Ambulatory Visit: Payer: Self-pay

## 2020-02-19 DIAGNOSIS — I480 Paroxysmal atrial fibrillation: Secondary | ICD-10-CM

## 2020-02-19 DIAGNOSIS — Z5181 Encounter for therapeutic drug level monitoring: Secondary | ICD-10-CM

## 2020-02-19 LAB — POCT INR: INR: 2.1 (ref 2.0–3.0)

## 2020-02-19 NOTE — Patient Instructions (Signed)
Continue warfarin 1 tablet daily except 1 1/2 tablets on Sundays and Thursdays Recheck in 6 wks

## 2020-03-03 ENCOUNTER — Emergency Department (HOSPITAL_COMMUNITY): Payer: Medicare HMO

## 2020-03-03 ENCOUNTER — Encounter (HOSPITAL_COMMUNITY): Payer: Self-pay

## 2020-03-03 ENCOUNTER — Other Ambulatory Visit: Payer: Self-pay

## 2020-03-03 ENCOUNTER — Inpatient Hospital Stay (HOSPITAL_COMMUNITY)
Admission: EM | Admit: 2020-03-03 | Discharge: 2020-03-08 | DRG: 177 | Disposition: A | Discharge status: Home / Self Care | Payer: Medicare HMO | Attending: Internal Medicine | Admitting: Internal Medicine

## 2020-03-03 ENCOUNTER — Telehealth: Payer: Self-pay

## 2020-03-03 DIAGNOSIS — R079 Chest pain, unspecified: Secondary | ICD-10-CM | POA: Diagnosis not present

## 2020-03-03 DIAGNOSIS — I088 Other rheumatic multiple valve diseases: Secondary | ICD-10-CM | POA: Diagnosis present

## 2020-03-03 DIAGNOSIS — I495 Sick sinus syndrome: Secondary | ICD-10-CM | POA: Diagnosis present

## 2020-03-03 DIAGNOSIS — Z91018 Allergy to other foods: Secondary | ICD-10-CM | POA: Diagnosis not present

## 2020-03-03 DIAGNOSIS — I5043 Acute on chronic combined systolic (congestive) and diastolic (congestive) heart failure: Secondary | ICD-10-CM | POA: Diagnosis present

## 2020-03-03 DIAGNOSIS — Z87891 Personal history of nicotine dependence: Secondary | ICD-10-CM | POA: Diagnosis not present

## 2020-03-03 DIAGNOSIS — I4891 Unspecified atrial fibrillation: Secondary | ICD-10-CM | POA: Diagnosis present

## 2020-03-03 DIAGNOSIS — Z79899 Other long term (current) drug therapy: Secondary | ICD-10-CM

## 2020-03-03 DIAGNOSIS — Z885 Allergy status to narcotic agent status: Secondary | ICD-10-CM | POA: Diagnosis not present

## 2020-03-03 DIAGNOSIS — J9600 Acute respiratory failure, unspecified whether with hypoxia or hypercapnia: Secondary | ICD-10-CM | POA: Diagnosis not present

## 2020-03-03 DIAGNOSIS — I509 Heart failure, unspecified: Secondary | ICD-10-CM | POA: Diagnosis not present

## 2020-03-03 DIAGNOSIS — I5033 Acute on chronic diastolic (congestive) heart failure: Secondary | ICD-10-CM

## 2020-03-03 DIAGNOSIS — J810 Acute pulmonary edema: Secondary | ICD-10-CM

## 2020-03-03 DIAGNOSIS — G9341 Metabolic encephalopathy: Secondary | ICD-10-CM | POA: Diagnosis present

## 2020-03-03 DIAGNOSIS — I429 Cardiomyopathy, unspecified: Secondary | ICD-10-CM | POA: Diagnosis present

## 2020-03-03 DIAGNOSIS — U071 COVID-19: Principal | ICD-10-CM

## 2020-03-03 DIAGNOSIS — Z7901 Long term (current) use of anticoagulants: Secondary | ICD-10-CM | POA: Diagnosis not present

## 2020-03-03 DIAGNOSIS — W57XXXA Bitten or stung by nonvenomous insect and other nonvenomous arthropods, initial encounter: Secondary | ICD-10-CM | POA: Diagnosis present

## 2020-03-03 DIAGNOSIS — J9601 Acute respiratory failure with hypoxia: Secondary | ICD-10-CM | POA: Diagnosis present

## 2020-03-03 DIAGNOSIS — R791 Abnormal coagulation profile: Secondary | ICD-10-CM | POA: Diagnosis present

## 2020-03-03 DIAGNOSIS — G9340 Encephalopathy, unspecified: Secondary | ICD-10-CM | POA: Diagnosis present

## 2020-03-03 DIAGNOSIS — S80862A Insect bite (nonvenomous), left lower leg, initial encounter: Secondary | ICD-10-CM | POA: Diagnosis present

## 2020-03-03 DIAGNOSIS — Z950 Presence of cardiac pacemaker: Secondary | ICD-10-CM | POA: Diagnosis present

## 2020-03-03 DIAGNOSIS — Z8711 Personal history of peptic ulcer disease: Secondary | ICD-10-CM

## 2020-03-03 DIAGNOSIS — I447 Left bundle-branch block, unspecified: Secondary | ICD-10-CM | POA: Diagnosis present

## 2020-03-03 DIAGNOSIS — J45909 Unspecified asthma, uncomplicated: Secondary | ICD-10-CM | POA: Diagnosis present

## 2020-03-03 DIAGNOSIS — I480 Paroxysmal atrial fibrillation: Secondary | ICD-10-CM | POA: Diagnosis present

## 2020-03-03 DIAGNOSIS — Z9102 Food additives allergy status: Secondary | ICD-10-CM | POA: Diagnosis not present

## 2020-03-03 DIAGNOSIS — R0602 Shortness of breath: Secondary | ICD-10-CM | POA: Diagnosis present

## 2020-03-03 DIAGNOSIS — Z91030 Bee allergy status: Secondary | ICD-10-CM

## 2020-03-03 DIAGNOSIS — Z87440 Personal history of urinary (tract) infections: Secondary | ICD-10-CM

## 2020-03-03 DIAGNOSIS — J1282 Pneumonia due to coronavirus disease 2019: Secondary | ICD-10-CM | POA: Diagnosis present

## 2020-03-03 HISTORY — DX: Presence of cardiac pacemaker: Z95.0

## 2020-03-03 HISTORY — DX: Angina pectoris, unspecified: I20.9

## 2020-03-03 LAB — BASIC METABOLIC PANEL
Anion gap: 11 (ref 5–15)
BUN: 18 mg/dL (ref 8–23)
CO2: 22 mmol/L (ref 22–32)
Calcium: 8.9 mg/dL (ref 8.9–10.3)
Chloride: 107 mmol/L (ref 98–111)
Creatinine, Ser: 1.02 mg/dL (ref 0.61–1.24)
GFR calc Af Amer: >60 mL/min (ref >60)
GFR calc non Af Amer: >60 mL/min (ref >60)
Glucose, Bld: 109 mg/dL — ABNORMAL HIGH (ref 70–99)
Potassium: 4.3 mmol/L (ref 3.5–5.1)
Sodium: 140 mmol/L (ref 135–145)

## 2020-03-03 LAB — CBC
HCT: 43.8 % (ref 39.0–52.0)
Hemoglobin: 14 g/dL (ref 13.0–17.0)
MCH: 29.4 pg (ref 26.0–34.0)
MCHC: 32 g/dL (ref 30.0–36.0)
MCV: 91.8 fL (ref 80.0–100.0)
Platelets: 299 10*3/uL (ref 150–400)
RBC: 4.77 MIL/uL (ref 4.22–5.81)
RDW: 14.2 % (ref 11.5–15.5)
WBC: 9.4 10*3/uL (ref 4.0–10.5)
nRBC: 0 % (ref 0.0–0.2)

## 2020-03-03 LAB — TSH: TSH: 1.094 u[IU]/mL (ref 0.350–4.500)

## 2020-03-03 LAB — SARS CORONAVIRUS 2 (TAT 6-24 HRS): SARS Coronavirus 2: POSITIVE — AB

## 2020-03-03 LAB — MAGNESIUM: Magnesium: 2.1 mg/dL (ref 1.7–2.4)

## 2020-03-03 LAB — PROTIME-INR
INR: 4.5 (ref 0.8–1.2)
Prothrombin Time: 42.8 seconds — ABNORMAL HIGH (ref 11.4–15.2)

## 2020-03-03 LAB — HEMOGLOBIN A1C
Hgb A1c MFr Bld: 6.1 % — ABNORMAL HIGH (ref 4.8–5.6)
Mean Plasma Glucose: 128.37 mg/dL

## 2020-03-03 LAB — TROPONIN I (HIGH SENSITIVITY)
Troponin I (High Sensitivity): 52 ng/L — ABNORMAL HIGH (ref <18)
Troponin I (High Sensitivity): 54 ng/L — ABNORMAL HIGH (ref <18)

## 2020-03-03 LAB — BRAIN NATRIURETIC PEPTIDE: B Natriuretic Peptide: 214.6 pg/mL — ABNORMAL HIGH (ref 0.0–100.0)

## 2020-03-03 MED ORDER — AMIODARONE LOAD VIA INFUSION
150.0000 mg | Freq: Once | INTRAVENOUS | Status: AC
  Administered 2020-03-03: 150 mg via INTRAVENOUS
  Filled 2020-03-03: qty 83.34

## 2020-03-03 MED ORDER — AMIODARONE HCL IN DEXTROSE 360-4.14 MG/200ML-% IV SOLN
30.0000 mg/h | INTRAVENOUS | Status: DC
  Administered 2020-03-04 – 2020-03-05 (×3): 30 mg/h via INTRAVENOUS
  Filled 2020-03-03 (×5): qty 200

## 2020-03-03 MED ORDER — WARFARIN - PHARMACIST DOSING INPATIENT: Freq: Every day | Status: DC

## 2020-03-03 MED ORDER — FUROSEMIDE 10 MG/ML IJ SOLN
40.0000 mg | INTRAMUSCULAR | Status: AC
  Administered 2020-03-03: 40 mg via INTRAVENOUS
  Filled 2020-03-03: qty 4

## 2020-03-03 MED ORDER — FUROSEMIDE 10 MG/ML IJ SOLN
40.0000 mg | Freq: Two times a day (BID) | INTRAMUSCULAR | Status: DC
  Administered 2020-03-03 – 2020-03-05 (×4): 40 mg via INTRAVENOUS
  Filled 2020-03-03 (×3): qty 4

## 2020-03-03 MED ORDER — AMIODARONE HCL IN DEXTROSE 360-4.14 MG/200ML-% IV SOLN
60.0000 mg/h | INTRAVENOUS | Status: DC
  Administered 2020-03-03: 60 mg/h via INTRAVENOUS
  Filled 2020-03-03: qty 200

## 2020-03-03 NOTE — ED Notes (Signed)
Patient states he was called by the A-Fib clinic today to come to the ED for further eval of a-fib. C/o soreness in the left upper chest states he has been coughing.  C/o lower bilateral ext. Swelling for 3-4 days.

## 2020-03-03 NOTE — Procedures (Signed)
Heart rate too high for accurate complete echo at this time

## 2020-03-03 NOTE — ED Provider Notes (Signed)
[facility] EMERGENCY DEPARTMENT Provider Note   CSN: [id number] Arrival date & time: 03/03/20  1134     History Chief Complaint  Patient presents with  . Chest Pain  . Atrial Fibrillation    NICODEMUS DENK is a 79 y.o. male.  HPI   This patient is a 79 year old male, he has a known history of asthma, he has a history of "cardiomyopathy", history of paroxysmal atrial fibrillation and is currently on Coumadin, he was recently admitted to an outside hospital where he was treated for diarrhea and dehydration, given IV fluids, spent 2 days and then went home.  His diarrhea has improved with Imodium, that being said he has had some progressive swelling of the lower extremities and progressive shortness of breath.  He was at home today when he received a phone call from the cardiology offices asking him to come to the hospital due to severe atrial fibrillation with rapid ventricular rate and tachycardia.  The patient states he has been feeling generally poorly but did not correlate that with any palpitations.  He has not had fevers, he feels cold and has some chills, he does have significant and worsening swelling of his legs which she does not usually had in the past.  I have reviewed the patient's medical record, his last echocardiogram was performed in 2017 and showed an ejection fraction of 40 to 45%.  His last pacemaker check was January 2021, he had 108 months of battery life remaining.    Past Medical History:  Diagnosis Date  . Asthma   . Bronchitis   . 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 History of epididymitis   . History of recurrent UTIs   . History of ruptured appendix   . Paroxysmal atrial fibrillation (HCC)   . Peptic ulcer disease   . Tachy-brady syndrome (HCC)    PPM (SJM) implanted by Dr Graciela Husbands 10/19/99, gen change 04/03/08    Patient Active Problem List   Diagnosis Date Noted  . Depression 12/02/2014  . Encounter for  therapeutic drug monitoring 01/29/2014  . Cough 02/27/2013  . Cardiomyopathy (HCC) 08/10/2012  . Long term current use of anticoagulant therapy 08/01/2012  . 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 Drug-induced photosensitivity 05/20/2012  . Left bundle branch block 05/20/2012  . Sinoatrial node dysfunction (HCC) 10/14/2009  . BEE STING ALLERGY 05/06/2009  . Paroxysmal atrial fibrillation (HCC) 03/10/2009  . HYPERSOMNIA WITH SLEEP APNEA UNSPECIFIED 03/10/2009    Past Surgical History:  Procedure Laterality Date  . EP IMPLANTABLE DEVICE [name] 11/17/2016   Procedure: PPM Generator Changeout;  Surgeon: Marinus Maw, MD;  Location: [facility] INVASIVE CV LAB;  Service: Cardiovascular;  Laterality: [name];  . PACEMAKER PLACEMENT  03/2008   St. Jude, [place]. 45       Family History  Problem Relation Age of Onset  . Suicidality Mother   . Cancer Father     Social History   Tobacco Use  . Smoking status: Former Smoker    Packs/day: 1.50    Years: 20.00    Pack years: 30.00    Types: Cigarettes    Start date: 08/20/1959    Quit date: 11/30/1977    Years since quitting: 42.2  . Smokeless tobacco: Never Used  Substance Use Topics  . Alcohol use: No    Alcohol/week: 0.0 standard drinks  . Drug use: No    Home Medications Prior to Admission medications  Medication Sig Start Date End Date Taking? Authorizing Provider  acetaminophen (TYLENOL) 650 MG CR tablet Take 1,300 mg by mouth every 8 (eight) hours as needed for pain.   Yes [provider]  amiodarone (PACERONE) 200 MG tablet Take 1/2 (one-half) tablet by mouth once daily 12/26/19  Yes Jonelle Sidle, MD  metoprolol tartrate (LOPRESSOR) 25 MG tablet Take 1 tablet by mouth twice daily 12/26/19  Yes Jonelle Sidle, MD  warfarin (COUMADIN) 2.5 MG tablet TAKE 1 TABLET DAILY EXCEPT 1 & 1/2 (ONE & ONE-HALF) TABLETS BY MOUTH ONCE ON SUNDAYS AND THURSDAYS. Patient  taking differently: Take 2.5 mg by mouth See admin instructions. Take 1 tablet daily except 1 & 1/2 (one and one half) tablets on Sundays and Thursdays 01/21/20  Yes Jonelle Sidle, MD  Homeopathic Products (ALLERGY MEDICINE PO) Take 1 tablet by mouth as needed (allergies).     [provider]  sertraline (ZOLOFT) 50 MG tablet Take 50 mg by mouth daily.     [provider]    Allergies    Bee venom, Corn-containing products, Codeine, and Vanilla  Review of Systems   Review of Systems  All other systems reviewed and are negative.   Physical Exam Updated Vital Signs BP (!) 142/66   Pulse 68   Temp 97.6 F (36.4 C) (Oral)   Resp (!) 22   Ht 1.778 m (5\' 10" )   Wt 96.2 kg   SpO2 97%   BMI 30.42 kg/m   Physical Exam Vitals and nursing note reviewed.  Constitutional:      General: He is in acute distress.     Appearance: He is well-developed.  HENT:     Head: Normocephalic and atraumatic.     Mouth/Throat:     Pharynx: No oropharyngeal exudate.  Eyes:     General: No scleral icterus.       Right eye: No discharge.        Left eye: No discharge.     Conjunctiva/sclera: Conjunctivae normal.     Pupils: Pupils are equal, round, and reactive to light.  Neck:     Thyroid: No thyromegaly.     Vascular: No JVD.  Cardiovascular:     Rate and Rhythm: Normal rate and regular rhythm.     Heart sounds: Normal heart sounds. No murmur. No friction rub. No gallop.   Pulmonary:     Effort: Respiratory distress present.     Breath sounds: Rales present. No wheezing.  Abdominal:     General: Bowel sounds are normal. There is no distension.     Palpations: Abdomen is soft. There is no mass.     Tenderness: There is no abdominal tenderness.  Musculoskeletal:        General: No tenderness. Normal range of motion.     Cervical back: Normal range of motion and neck supple.     Right lower leg: Edema present.     Left lower leg: Edema present.  Lymphadenopathy:      Cervical: No cervical adenopathy.  Skin:    General: Skin is warm and dry.     Findings: No erythema or rash.  Neurological:     Mental Status: He is alert.     Coordination: Coordination normal.  Psychiatric:        Behavior: Behavior normal.     ED Results / Procedures / Treatments   Labs (all labs ordered are listed, but only abnormal results are displayed) Labs Reviewed  BASIC METABOLIC PANEL - Abnormal; Notable for the following components:      Result Value   Glucose, Bld 109 (*)    All other components within normal limits  BRAIN NATRIURETIC PEPTIDE - Abnormal; Notable for the following components:   B Natriuretic Peptide 214.6 (*)    All other components within normal limits  TROPONIN I (HIGH SENSITIVITY) - Abnormal; Notable for the following components:   Troponin I (High Sensitivity) 52 (*)    All other components within normal limits  SARS CORONAVIRUS 2 (TAT 6-24 HRS)  CBC  PROTIME-INR  TROPONIN I (HIGH SENSITIVITY)    EKG EKG Interpretation  Date/Time:  Monday March 03 2020 11:39:28 EDT Ventricular Rate:  74 PR Interval:  162 QRS Duration: 134 QT Interval:  448 QTC Calculation: 497 R Axis:   -57 Text Interpretation: Normal sinus rhythm Possible Left atrial enlargement Left axis deviation Left bundle branch block Abnormal ECG Since last tracing rate slower Confirmed by Noemi Chapel [phone]) on 03/03/2020 12:19:08 PM   Radiology DG Chest 2 View  Result Date: 03/03/2020 CLINICAL DATA:  Shortness of breath. Left-sided chest pain. EXAM: CHEST - 2 VIEW COMPARISON:  09/06/2013 FINDINGS: Bilateral lower lobe and patchy right upper lobe airspace disease. Possible small right pleural effusion. No left pleural effusion. No pneumothorax. Stable cardiomediastinal silhouette. No aggressive osseous crash that dual lead cardiac pacemaker. No aggressive osseous lesion. IMPRESSION: Bilateral lower lobe and right upper lobe airspace disease concerning for multilobar pneumonia.  Followup PA and lateral chest X-ray is recommended in 3-4 weeks following trial of antibiotic therapy to ensure resolution and exclude underlying malignancy. Electronically Signed   By: Kathreen Devoid   On: 03/03/2020 12:09    Procedures .Critical Care Performed by: Noemi Chapel, MD Authorized by: Noemi Chapel, MD   Critical care provider statement:    Critical care time (minutes):  35   Critical care time was exclusive of:  Separately billable procedures and treating other patients and teaching time   Critical care was necessary to treat or prevent imminent or life-threatening deterioration of the following conditions:  Cardiac failure   Critical care was time spent personally by me on the following activities:  Blood draw for specimens, development of treatment plan with patient or surrogate, discussions with consultants, evaluation of patient's response to treatment, examination of patient, obtaining history from patient or surrogate, ordering and performing treatments and interventions, ordering and review of laboratory studies, ordering and review of radiographic studies, pulse oximetry, re-evaluation of patient's condition and review of old charts   (including critical care time)  Medications Ordered in ED Medications  furosemide (LASIX) injection 40 mg (40 mg Intravenous Given 03/03/20 1301)    ED Course  I have reviewed the triage vital signs and the nursing notes.  Pertinent labs & imaging results that were available during my care of the patient were reviewed by me and considered in my medical decision making (see chart for details).    MDM Rules/Calculators/A&P                       This patient presents to the ED for concern of shortness of breath and atrial fibrillation, this involves an extensive number of treatment options, and is a complaint that carries with it a high risk of complications and morbidity.  The differential diagnosis includes rapid atrial fibrillation,  worsening congestive heart failure, would also consider ischemic cardiomyopathy   Lab Tests:   I Ordered, reviewed, and  interpreted labs, which included CBC, CMP, troponin, BNP, chest x-ray, EKG, cardiac monitoring, recent admission to the hospital thus prompting the evaluation for possible pneumonia as well as a Covid test though he has tested negative in the past  Medicines ordered:   I ordered medication Lasix for pulmonary edema  Imaging Studies ordered:   I ordered imaging studies which included chest x-ray and  I independently visualized and interpreted imaging which showed that the patient has bilateral pulmonary infiltrates both upper and lower lobes  Additional history obtained:   Additional history obtained from medical record  Previous records obtained and reviewed per the medical record  Consultations Obtained:   I consulted cardiology and discussed lab and imaging findings, consulting team to admit the patient, agree with Lasix and retesting for Covid. Discussed with Rosann Auerbach, they will have the team see the patient.  Reevaluation:  After the interventions stated above, I reevaluated the patient and found slight improvement, Lasix given  Critical Interventions:  Lasix Supplemental oxygen as needed Cardiac monitoring  Final Clinical Impression(s) / ED Diagnoses Final diagnoses:  Acute pulmonary edema (HCC)  Acute on chronic congestive heart failure, unspecified heart failure type (HCC)  Paroxysmal atrial fibrillation (HCC)      Eber Hong, MD 03/03/20 1355

## 2020-03-03 NOTE — Telephone Encounter (Signed)
[facility] alert received for HVR, increased AF burden, AF with RVR.  Py current med list includes Amiodarone, Metoprolol and Warfarin.  Recent documentation shows pt was to be contacted for OV.  No OV ahs been set up as of yet.    Spoke with pt, he states he feels like he "is knocking at death's door"  Pt indicates he has had some kind of stomach bug that has made him nauseous, unable to eat and caused diarrhea.  For over a week now.  Since Thursday he has experienced SOB with exertion and LE edema that has made it difficult to walk.  Pt states he went to hospital last week and there were no changes, this was prior to LE swelling and SOB.  Pt indicates due to stomach issues he has not been compliant with medications.    Advised pt due to deteriorating condition he needs to be seen by MD today, preferably in ED at [facility] where EP is present.  If unable to have family/ friends drive to [facility] then at least be seen at nearest ED for potential HF management

## 2020-03-03 NOTE — ED Triage Notes (Signed)
Pt reports chest pain for the past 2 days, has a pacemaker and was called by cardiologist and told to come to here due to a fib, pt taking coumadin. Pt a.o, resp e.u at this time.

## 2020-03-03 NOTE — ED Notes (Signed)
Dinner ordered 

## 2020-03-03 NOTE — ED Notes (Signed)
Pt transported to CT ?

## 2020-03-03 NOTE — Progress Notes (Signed)
ANTICOAGULATION CONSULT NOTE - Initial Consult  Pharmacy Consult for Warfarin Indication: atrial fibrillation  Allergies  Allergen Reactions  . Bee Venom Shortness Of Breath    BEE STINGS - dizziness   . Corn-Containing Products Anaphylaxis    Corn bread, pop corn, all corn products  . Codeine     REACTION: muscle cramps  . Vanilla Diarrhea    Patient Measurements: Height: 5\' 10"  (177.8 cm) Weight: 96.2 kg (212 lb) IBW/kg (Calculated) : 73 Heparin Dosing Weight:   Vital Signs: Temp: 97.6 F (36.4 C) (04/05 1149) Temp Source: Oral (04/05 1149) BP: 119/82 (04/05 1545) Pulse Rate: 63 (04/05 1545)  Labs: Recent Labs    03/03/20 1146 03/03/20 1235 03/03/20 1257  HGB 14.0  --   --   HCT 43.8  --   --   PLT 299  --   --   LABPROT  --   --  42.8*  INR  --   --  4.5*  CREATININE 1.02  --   --   TROPONINIHS 52* 54*  --     Estimated Creatinine Clearance: 71.7 mL/min (by C-G formula based on SCr of 1.02 mg/dL).   Medical History: Past Medical History:  Diagnosis Date  . Asthma   . Bronchitis   . 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 History of epididymitis   . History of recurrent UTIs   . History of ruptured appendix   . Paroxysmal atrial fibrillation (HCC)   . Peptic ulcer disease   . Tachy-brady syndrome (HCC)    PPM (SJM) implanted by Dr 05/03/20 10/19/99, gen change 04/03/08    Medications:  Scheduled:  . amiodarone  150 mg Intravenous Once  . furosemide  40 mg Intravenous BID  . [START ON 03/04/2020] Warfarin - Pharmacist Dosing Inpatient   Does not apply q1600    Assessment: Patient is a 55 yom that presents to the ED in Afib with RVR. The patient was recently treated at an outside hospital for diarrhea and dehydration. The patient is on warfarin at home and pharmacy has been asked to manage the patient warfarin while inpatient.   PTA warfarin regimen: 3.75mg  Q Sun, Thu and 2.5mg  ROW    Goal of Therapy:  INR 2-3 Monitor platelets by  anticoagulation protocol: Yes   Plan:  - INR currently supra-therapeutic at 4.5  - Will hold warfarin this evening  - Monitor patient for s/s of bleeding - Daily PT-INR and monitor CBC    Sat PharmD. BCPS  03/03/2020,4:24 PM

## 2020-03-03 NOTE — ED Provider Notes (Signed)
2020, patients RN indicates no admit orders - Earlier EDP had indicated cardiology was coming to see/admit.   Cardiology called - they indicate feel pt needs medicine service admission.  Patients covid test is noted to be positive.   Unassigned medicine consulted for admission. Cardiology is managing afib/cardiac issues.       Cathren Laine, MD 03/03/20 2023

## 2020-03-03 NOTE — Consult Note (Signed)
Cardiology Admission History and Physical:   Patient ID: [name] MRN: [id number]; DOB: Jul 22, 1941   Admission date: 03/03/2020  Primary Care Provider: Suzan Slick, MD Primary Cardiologist: [name], MD  Primary Electrophysiologist:  None   Chief Complaint:  Afib  Patient Profile:   [name] is a 79 y.o. 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    History of Present Illness:   [name] is followed by [name] for the above cardiac issues. Echo in 2017 showed EF 40-45%, G1DD, trivial MR, mildly dilated RA, trivial TR.  Stress test in 2017 was low risk showing small, mild intensity, mid to apical inferoseptal defect that was suggestive of mild ischemia, although with RV pacing/IVCD this might have been contributing to defect. He was last seen in the office 09/2019 and was having some occasional dizziness while on the commode at home. Denied chest pain or other symptoms. EKG showed sinus, HR 60. Most recent PPM check 12/17/19 showing normal function.   The patient presented to the ED 03/03/20 for chest pain and afib. He reported he had a recent ED visit 02/25/20 for dehydration and diarrhea treated with IVF. He went home and since then had been experiencing lower leg edema and progressive sob. He says he has had trouble breathing on exertion and at night. It has been difficult to walk given lower leg edema. He has been having chest pain on the left side but says this is only when he coughs. Denies diet noncompliance since he has not been eating much. This morning the Afib clinic called him regarding increased Afib burden and Afib RVR. He was recommended to the to the ED for evaluation. Denied palpitations, fever, chills. He has not had recurrent diarrhea  In the ED BP  142/66, afebrile, RR 22, 97%. Labs showed glucose 109, creatinine 1.02, WBC 9.4, Hgb 14.0,  BNP 219, HS troponin 52>54, INR 4.5. EKG showed sinus, 74 bpm, chronic LBBB. CXR showed B/L lower lobe and right upper lobe airspace concerning for multilobar PNA, small right pleural effusion. He was given Iv lasix 40 mg in the ED.  Past Medical History:  Diagnosis Date  . Asthma   . Bronchitis   . 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 History of epididymitis   . History of recurrent UTIs   . History of ruptured appendix   . Paroxysmal atrial fibrillation (HCC)   . Peptic ulcer disease   . Tachy-brady syndrome (HCC)    PPM (SJM) implanted by Dr Graciela Husbands 10/19/99, gen change 04/03/08    Past Surgical History:  Procedure Laterality Date  . EP IMPLANTABLE DEVICE [name] 11/17/2016   Procedure: PPM Generator Changeout;  Surgeon: Marinus Maw, MD;  Location: [facility] INVASIVE CV LAB;  Service: Cardiovascular;  Laterality: [name];  . PACEMAKER PLACEMENT  03/2008   [employer], [place]. 5826     Medications Prior to Admission: Prior to Admission medications   Medication Sig Start Date End Date Taking? Authorizing Provider  acetaminophen (TYLENOL) 650 MG CR tablet Take 1,300 mg by mouth every 8 (eight) hours as needed for pain.   Yes [provider]  amiodarone (PACERONE) 200 MG tablet Take 1/2 (one-half) tablet by mouth once daily 12/26/19  Yes Jonelle Sidle, MD  metoprolol tartrate (LOPRESSOR)  25 MG tablet Take 1 tablet by mouth twice daily 12/26/19  Yes Satira Sark, MD  warfarin (COUMADIN) 2.5 MG tablet TAKE 1 TABLET DAILY EXCEPT 1 & 1/2 (ONE & ONE-HALF) TABLETS BY MOUTH ONCE ON SUNDAYS AND THURSDAYS. Patient taking differently: Take 2.5 mg by mouth See admin instructions. Take 1 tablet daily except 1 & 1/2 (one and one half) tablets on Sundays and Thursdays 01/21/20  Yes Satira Sark, MD  Homeopathic Products (ALLERGY MEDICINE PO) Take 1 tablet by mouth as needed  (allergies).     [provider]  sertraline (ZOLOFT) 50 MG tablet Take 50 mg by mouth daily.     [provider]     Allergies:    Allergies  Allergen Reactions  . Bee Venom Shortness Of Breath    BEE STINGS - dizziness   . Corn-Containing Products Anaphylaxis    Corn bread, pop corn, all corn products  . Codeine     REACTION: muscle cramps  . Vanilla Diarrhea    Social History:   Social History   Socioeconomic History  . Marital status: Divorced    Spouse name: Not on file  . Number of children: Not on file  . Years of education: Not on file  . Highest education level: Not on file  Occupational History  . Not on file  Tobacco Use  . Smoking status: Former Smoker    Packs/day: 1.50    Years: 20.00    Pack years: 30.00    Types: Cigarettes    Start date: 08/20/1959    Quit date: 11/30/1977    Years since quitting: 42.2  . Smokeless tobacco: Never Used  Substance and Sexual Activity  . Alcohol use: No    Alcohol/week: 0.0 standard drinks  . Drug use: No  . Sexual activity: Not on file  Other Topics Concern  . Not on file  Social History Narrative  . Not on file   Social Determinants of Health   Financial Resource Strain:   . Difficulty of Paying Living Expenses:   Food Insecurity:   . Worried About Charity fundraiser in the Last Year:   . Arboriculturist in the Last Year:   Transportation Needs:   . Film/video editor (Medical):   Marland Kitchen Lack of Transportation (Non-Medical):   Physical Activity:   . Days of Exercise per Week:   . Minutes of Exercise per Session:   Stress:   . Feeling of Stress :   Social Connections:   . Frequency of Communication with Friends and Family:   . Frequency of Social Gatherings with Friends and Family:   . Attends Religious Services:   . Active Member of Clubs or Organizations:   . Attends Archivist Meetings:   Marland Kitchen Marital Status:   Intimate Partner Violence:   . Fear of Current or Ex-Partner:     . Emotionally Abused:   Marland Kitchen Physically Abused:   . Sexually Abused:     Family History:   The patient's family history includes Cancer in his father; Suicidality in his mother.    ROS:  Please see the history of present illness.  All other ROS reviewed and negative.     Physical Exam/Data:   Vitals:   03/03/20 1139 03/03/20 1149 03/03/20 1245 03/03/20 1415  BP:  138/76 (!) 142/66   Pulse:  74 68 75  Resp:  (!) 22 (!) 22 19  Temp:  97.6 F (36.4 C)  TempSrc:  Oral    SpO2:  94% 97% 96%  Weight: 96.2 kg 96.2 kg    Height: 5\' 10"  (1.778 m) 5\' 10"  (1.778 m)      Intake/Output Summary (Last 24 hours) at 03/03/2020 1434 Last data filed at 03/03/2020 1347 Gross per 24 hour  Intake --  Output 400 ml  Net -400 ml   Last 3 Weights 03/03/2020 03/03/2020 10/16/2019  Weight (lbs) 212 lb 212 lb 229 lb 6.4 oz  Weight (kg) 96.163 kg 96.163 kg 104.055 kg     Body mass index is 30.42 kg/m.  General:  Well nourished, well developed, in no acute distress HEENT: normal Lymph: no adenopathy Neck: no JVD Endocrine:  No thryomegaly Vascular: No carotid bruits; FA pulses 2+ bilaterally without bruits  Cardiac:  normal S1, S2; Irreg Irreg, tachy; no murmur  Lungs:  clear to auscultation bilaterally, no wheezing, rhonchi or rales  Abd: soft, nontender, no hepatomegaly  Ext: 2+ edema Musculoskeletal:  No deformities, BUE and BLE strength normal and equal Skin: warm and dry  Neuro:  CNs 2-12 intact, no focal abnormalities noted Psych:  Abnormal affect    EKG:  The ECG that was done 03/03/20 was personally reviewed and demonstrates NSR, 74 bpm, LBBB, LAD, minimal ST elevation V1, V2,V3  Relevant CV Studies:  Echo 2017 - Left ventricle: The cavity size was normal. Wall thickness was  increased increased in a pattern of mild to moderate LVH.  Systolic function was mildly to moderately reduced. The estimated  ejection fraction was in the range of 40% to 45%. Diffuse  hypokinesis.  Doppler parameters are consistent with abnormal left  ventricular relaxation (grade 1 diastolic dysfunction).  - Ventricular septum: Septal motion showed abnormal function and  dyssynergy.  - Aortic valve: Trileaflet; mildly calcified leaflets.  - Mitral valve: Calcified annulus. There was trivial regurgitation.  - Right ventricle: Pacer wire or catheter noted in right ventricle.  - Right atrium: The atrium was mildly dilated. Central venous  pressure (est): 3 mm Hg.  - Atrial septum: No defect or patent foramen ovale was identified.  - Tricuspid valve: There was trivial regurgitation.  - Pulmonary arteries: PA peak pressure: 23 mm Hg (S).  - Pericardium, extracardiac: There was no pericardial effusion.   Laboratory Data:  High Sensitivity Troponin:   Recent Labs  Lab 03/03/20 1146 03/03/20 1235  TROPONINIHS 52* 54*      Chemistry Recent Labs  Lab 03/03/20 1146  NA 140  K 4.3  CL 107  CO2 22  GLUCOSE 109*  BUN 18  CREATININE 1.02  CALCIUM 8.9  GFRNONAA >60  GFRAA >60  ANIONGAP 11    No results for input(s): PROT, ALBUMIN, AST, ALT, ALKPHOS, BILITOT in the last 168 hours. Hematology Recent Labs  Lab 03/03/20 1146  WBC 9.4  RBC 4.77  HGB 14.0  HCT 43.8  MCV 91.8  MCH 29.4  MCHC 32.0  RDW 14.2  PLT 299   BNP Recent Labs  Lab 03/03/20 1257  BNP 214.6*    DDimer No results for input(s): DDIMER in the last 168 hours.   Radiology/Studies:  DG Chest 2 View  Result Date: 03/03/2020 CLINICAL DATA:  Shortness of breath. Left-sided chest pain. EXAM: CHEST - 2 VIEW COMPARISON:  09/06/2013 FINDINGS: Bilateral lower lobe and patchy right upper lobe airspace disease. Possible small right pleural effusion. No left pleural effusion. No pneumothorax. Stable cardiomediastinal silhouette. No aggressive osseous crash that dual lead cardiac pacemaker. No aggressive osseous  lesion. IMPRESSION: Bilateral lower lobe and right upper lobe airspace disease concerning for  multilobar pneumonia. Followup PA and lateral chest X-ray is recommended in 3-4 weeks following trial of antibiotic therapy to ensure resolution and exclude underlying malignancy. Electronically Signed   By: Elige Ko   On: 03/03/2020 12:09   { HEAR Score (for undifferentiated chest pain):  HEAR Score: 3    Assessment and Plan:   Acute on chronic diastolic CHF presented with lower leg edema and progressive sob. Was given IVF in the ED 3/29 for diarrhea. Has possibly missed some medications since then. Is not on diuretic at home. CXR with possible PNA and right pleural effusion. BNP mildly up to 219 and HS troponin mildly elevated with flat trend. Given IV lasix in the ED and patient breathing improving - Echo in 2017 with EF 40-45% and G1DD. Will re-check - Patient is volume up on exam. - start IV lasix 40 mg BID - daily weights.  - strict I/Os - monitor creatinine with diuresis - continue home BB  Paroxysmal Afib - patient has PPM which recently recorded increase in Afib burden 7.1%/Afib RVR - EKG on admission with sinus rhythm. While in the ED patient converted into Afib RVR with rates up to 160 - patient was on Lopressor 25 mg BID daily and amiodarone at baseline - check TSH - K+4.3 - check Mag  - warfarin per pharmacy. INR today 4.5. in the past has been therapeutic. - Will start IV amiodarone. Continue BB  Possible PNA - B/L lower lobe and right upper lobe airspace disease concerning for multilobar PNA - patient has been coughing, afebrile, no leukocytosis - COVID pending - patient has had cough documented in the past 09/05/20 note from [facility]>wonder if this is from [facility] toxicity. Will review with MD - CXR ordered for tomorrow morning  Cardiomyopathy - likely tachy-mediated - EF in 2017 40-45% - Check echo today - continue home Lopressor 25mg  BID  Chest pain - in the setting of Afib RVR and volume overload - HS troponin 52 > 54 - Patient says CP worse with coughing -  stress test in 2017 was low risk - continue to monitor - with mild troponin elevation will risk stratify with A1C and lipid panel  Abnormal affect - check head CT   For questions or updates, please contact [facility] Please consult [email or web address] for contact info under        Signed, September Mormile 2018, PA-C  03/03/2020 2:34 PM

## 2020-03-03 NOTE — Telephone Encounter (Signed)
Noted  Pt covid positive

## 2020-03-04 ENCOUNTER — Encounter (HOSPITAL_COMMUNITY): Payer: Self-pay | Admitting: Internal Medicine

## 2020-03-04 ENCOUNTER — Other Ambulatory Visit: Payer: Self-pay

## 2020-03-04 ENCOUNTER — Inpatient Hospital Stay (HOSPITAL_COMMUNITY): Payer: Medicare HMO

## 2020-03-04 DIAGNOSIS — I4891 Unspecified atrial fibrillation: Secondary | ICD-10-CM | POA: Diagnosis present

## 2020-03-04 DIAGNOSIS — G9340 Encephalopathy, unspecified: Secondary | ICD-10-CM | POA: Diagnosis present

## 2020-03-04 DIAGNOSIS — R079 Chest pain, unspecified: Secondary | ICD-10-CM

## 2020-03-04 DIAGNOSIS — J9601 Acute respiratory failure with hypoxia: Secondary | ICD-10-CM | POA: Diagnosis present

## 2020-03-04 LAB — COMPREHENSIVE METABOLIC PANEL
ALT: 20 U/L (ref 0–44)
AST: 20 U/L (ref 15–41)
Albumin: 2.4 g/dL — ABNORMAL LOW (ref 3.5–5.0)
Alkaline Phosphatase: 45 U/L (ref 38–126)
Anion gap: 15 (ref 5–15)
BUN: 18 mg/dL (ref 8–23)
CO2: 22 mmol/L (ref 22–32)
Calcium: 8.5 mg/dL — ABNORMAL LOW (ref 8.9–10.3)
Chloride: 100 mmol/L (ref 98–111)
Creatinine, Ser: 1.01 mg/dL (ref 0.61–1.24)
GFR calc Af Amer: >60 mL/min (ref >60)
GFR calc non Af Amer: >60 mL/min (ref >60)
Glucose, Bld: 114 mg/dL — ABNORMAL HIGH (ref 70–99)
Potassium: 3.7 mmol/L (ref 3.5–5.1)
Sodium: 137 mmol/L (ref 135–145)
Total Bilirubin: 0.7 mg/dL (ref 0.3–1.2)
Total Protein: 6.4 g/dL — ABNORMAL LOW (ref 6.5–8.1)

## 2020-03-04 LAB — CBC WITH DIFFERENTIAL/PLATELET
Abs Immature Granulocytes: 0.09 10*3/uL — ABNORMAL HIGH (ref 0.00–0.07)
Basophils Absolute: 0 10*3/uL (ref 0.0–0.1)
Basophils Relative: 0 %
Eosinophils Absolute: 0.1 10*3/uL (ref 0.0–0.5)
Eosinophils Relative: 1 %
HCT: 41.8 % (ref 39.0–52.0)
Hemoglobin: 13.7 g/dL (ref 13.0–17.0)
Immature Granulocytes: 1 %
Lymphocytes Relative: 16 %
Lymphs Abs: 1.2 10*3/uL (ref 0.7–4.0)
MCH: 29.2 pg (ref 26.0–34.0)
MCHC: 32.8 g/dL (ref 30.0–36.0)
MCV: 89.1 fL (ref 80.0–100.0)
Monocytes Absolute: 0.7 10*3/uL (ref 0.1–1.0)
Monocytes Relative: 9 %
Neutro Abs: 5.6 10*3/uL (ref 1.7–7.7)
Neutrophils Relative %: 73 %
Platelets: 284 10*3/uL (ref 150–400)
RBC: 4.69 MIL/uL (ref 4.22–5.81)
RDW: 14.3 % (ref 11.5–15.5)
WBC: 7.6 10*3/uL (ref 4.0–10.5)
nRBC: 0 % (ref 0.0–0.2)

## 2020-03-04 LAB — PROTIME-INR
INR: 4.8 (ref 0.8–1.2)
INR: 4.6 (ref 0.8–1.2)
Prothrombin Time: 45.1 seconds — ABNORMAL HIGH (ref 11.4–15.2)
Prothrombin Time: 43.4 seconds — ABNORMAL HIGH (ref 11.4–15.2)

## 2020-03-04 LAB — LIPID PANEL
Cholesterol: 156 mg/dL (ref 0–200)
HDL: 27 mg/dL — ABNORMAL LOW (ref >40)
LDL Cholesterol: 102 mg/dL — ABNORMAL HIGH (ref 0–99)
Total CHOL/HDL Ratio: 5.8 RATIO
Triglycerides: 136 mg/dL (ref <150)
VLDL: 27 mg/dL (ref 0–40)

## 2020-03-04 LAB — ABO/RH: ABO/RH(D): A POS

## 2020-03-04 LAB — C-REACTIVE PROTEIN
CRP: 12.6 mg/dL — ABNORMAL HIGH (ref <1.0)
CRP: 11.9 mg/dL — ABNORMAL HIGH (ref <1.0)

## 2020-03-04 LAB — D-DIMER, QUANTITATIVE: D-Dimer, Quant: 4.83 ug/mL-FEU — ABNORMAL HIGH (ref 0.00–0.50)

## 2020-03-04 LAB — TROPONIN I (HIGH SENSITIVITY): Troponin I (High Sensitivity): 52 ng/L — ABNORMAL HIGH (ref <18)

## 2020-03-04 LAB — FERRITIN
Ferritin: 376 ng/mL — ABNORMAL HIGH (ref 24–336)
Ferritin: 319 ng/mL (ref 24–336)

## 2020-03-04 LAB — ECHOCARDIOGRAM LIMITED
Height: 70 in
Weight: 3432.12 oz

## 2020-03-04 LAB — FIBRINOGEN: Fibrinogen: 533 mg/dL — ABNORMAL HIGH (ref 210–475)

## 2020-03-04 LAB — MAGNESIUM: Magnesium: 2 mg/dL (ref 1.7–2.4)

## 2020-03-04 LAB — TSH: TSH: 2.093 u[IU]/mL (ref 0.350–4.500)

## 2020-03-04 LAB — STREP PNEUMONIAE URINARY ANTIGEN: Strep Pneumo Urinary Antigen: NEGATIVE

## 2020-03-04 LAB — LACTATE DEHYDROGENASE: LDH: 253 U/L — ABNORMAL HIGH (ref 98–192)

## 2020-03-04 LAB — TRIGLYCERIDES: Triglycerides: 163 mg/dL — ABNORMAL HIGH (ref <150)

## 2020-03-04 LAB — PROCALCITONIN
Procalcitonin: 0.22 ng/mL
Procalcitonin: 0.17 ng/mL

## 2020-03-04 MED ORDER — SODIUM CHLORIDE 0.9 % IV SOLN
500.0000 mg | INTRAVENOUS | Status: DC
  Administered 2020-03-04: 500 mg via INTRAVENOUS
  Filled 2020-03-04: qty 500

## 2020-03-04 MED ORDER — AMIODARONE HCL 200 MG PO TABS
100.0000 mg | ORAL_TABLET | Freq: Every day | ORAL | Status: DC
  Administered 2020-03-04: 100 mg via ORAL
  Filled 2020-03-04: qty 1

## 2020-03-04 MED ORDER — ACETAMINOPHEN 325 MG PO TABS
650.0000 mg | ORAL_TABLET | Freq: Four times a day (QID) | ORAL | Status: DC | PRN
  Administered 2020-03-04 – 2020-03-07 (×4): 650 mg via ORAL
  Filled 2020-03-04 (×4): qty 2

## 2020-03-04 MED ORDER — SODIUM CHLORIDE 0.9 % IV SOLN
100.0000 mg | Freq: Every day | INTRAVENOUS | Status: AC
  Administered 2020-03-05 – 2020-03-08 (×4): 100 mg via INTRAVENOUS
  Filled 2020-03-04 (×4): qty 20

## 2020-03-04 MED ORDER — DEXAMETHASONE SODIUM PHOSPHATE 10 MG/ML IJ SOLN
6.0000 mg | INTRAMUSCULAR | Status: DC
  Administered 2020-03-04 – 2020-03-08 (×5): 6 mg via INTRAVENOUS
  Filled 2020-03-04 (×5): qty 1

## 2020-03-04 MED ORDER — SODIUM CHLORIDE 0.9 % IV SOLN: 100.0000 mg | Freq: Every day | INTRAVENOUS | Status: DC

## 2020-03-04 MED ORDER — SODIUM CHLORIDE 0.9 % IV SOLN
100.0000 mg | INTRAVENOUS | Status: AC
  Administered 2020-03-04 (×2): 100 mg via INTRAVENOUS
  Filled 2020-03-04: qty 20

## 2020-03-04 MED ORDER — SODIUM CHLORIDE 0.9 % IV SOLN
2.0000 g | INTRAVENOUS | Status: DC
  Administered 2020-03-04: 2 g via INTRAVENOUS
  Filled 2020-03-04: qty 20

## 2020-03-04 MED ORDER — METOPROLOL TARTRATE 25 MG PO TABS
25.0000 mg | ORAL_TABLET | Freq: Two times a day (BID) | ORAL | Status: DC
  Administered 2020-03-04 – 2020-03-08 (×8): 25 mg via ORAL
  Filled 2020-03-04 (×3): qty 1
  Filled 2020-03-04: qty 2
  Filled 2020-03-04 (×5): qty 1

## 2020-03-04 MED ORDER — SODIUM CHLORIDE 0.9 % IV SOLN: 200.0000 mg | Freq: Once | INTRAVENOUS | Status: DC

## 2020-03-04 NOTE — Progress Notes (Signed)
Subjective:  Denies SSCP, palpitations or Dyspnea Wants more food   Objective:  Vitals:   03/04/20 0500 03/04/20 0600 03/04/20 0752 03/04/20 0810  BP:   (!) 128/98   Pulse:   96 (!) 102  Resp:  20 19 20   Temp:   97.9 F (36.6 C)   TempSrc:   Oral   SpO2:   93% 91%  Weight: 97.3 kg     Height:        Intake/Output from previous day:  Intake/Output Summary (Last 24 hours) at 03/04/2020 0817 Last data filed at 03/04/2020 9371 Gross per 24 hour  Intake 977.77 ml  Output 850 ml  Net 127.77 ml    Physical Exam: Affect appropriate Chronically ill  HEENT: normal Neck supple with no adenopathy JVP normal no bruits no thyromegaly Lungs basilar crackles  Heart:  S1/S2 no murmur, no rub, gallop or click PMI normal pacer under left clavicle  Abdomen: benighn, BS positve, no tenderness, no AAA no bruit.  No HSM or HJR Distal pulses intact with no bruits Plus 2 LE edema  Neuro non-focal Skin warm and dry No muscular weakness   Lab Results: Basic Metabolic Panel: Recent Labs    03/03/20 1146 03/03/20 1656 03/04/20 0709  NA 140  --  137  K 4.3  --  3.7  CL 107  --  100  CO2 22  --  22  GLUCOSE 109*  --  114*  BUN 18  --  18  CREATININE 1.02  --  1.01  CALCIUM 8.9  --  8.5*  MG  --  2.1 2.0   Liver Function Tests: Recent Labs    03/04/20 0709  AST 20  ALT 20  ALKPHOS 45  BILITOT 0.7  PROT 6.4*  ALBUMIN 2.4*   No results for input(s): LIPASE, AMYLASE in the last 72 hours. CBC: Recent Labs    03/03/20 1146 03/04/20 0709  WBC 9.4 7.6  NEUTROABS  --  5.6  HGB 14.0 13.7  HCT 43.8 41.8  MCV 91.8 89.1  PLT 299 284   Cardiac Enzymes: No results for input(s): CKTOTAL, CKMB, CKMBINDEX, TROPONINI in the last 72 hours. BNP: Invalid input(s): POCBNP D-Dimer: Recent Labs    03/03/20 2230  DDIMER 4.83*   Hemoglobin A1C: Recent Labs    03/03/20 1656  HGBA1C 6.1*   Fasting Lipid Panel: Recent Labs    03/04/20 0249  CHOL 156  HDL 27*  LDLCALC  102*  TRIG 136  CHOLHDL 5.8   Thyroid Function Tests: Recent Labs    03/03/20 1656  TSH 1.094   Anemia Panel: Recent Labs    03/03/20 2230  FERRITIN 376*    Imaging: DG Chest 2 View  Result Date: 03/03/2020 CLINICAL DATA:  Shortness of breath. Left-sided chest pain. EXAM: CHEST - 2 VIEW COMPARISON:  09/06/2013 FINDINGS: Bilateral lower lobe and patchy right upper lobe airspace disease. Possible small right pleural effusion. No left pleural effusion. No pneumothorax. Stable cardiomediastinal silhouette. No aggressive osseous crash that dual lead cardiac pacemaker. No aggressive osseous lesion. IMPRESSION: Bilateral lower lobe and right upper lobe airspace disease concerning for multilobar pneumonia. Followup PA and lateral chest X-ray is recommended in 3-4 weeks following trial of antibiotic therapy to ensure resolution and exclude underlying malignancy. Electronically Signed   By: Kathreen Devoid   On: 03/03/2020 12:09   CT HEAD WO CONTRAST  Result Date: 03/03/2020 CLINICAL DATA:  Atrial fibrillation, altered mental status EXAM: CT HEAD WITHOUT  CONTRAST TECHNIQUE: Contiguous axial images were obtained from the base of the skull through the vertex without intravenous contrast. COMPARISON:  None. FINDINGS: Brain: There is atrophy and chronic small vessel disease changes. No acute intracranial abnormality. Specifically, no hemorrhage, hydrocephalus, mass lesion, acute infarction, or significant intracranial injury. Vascular: No hyperdense vessel or unexpected calcification. Skull: No acute calvarial abnormality. Sinuses/Orbits: Mucosal thickening.  No acute findings. Other: None IMPRESSION: Atrophy, chronic microvascular disease. No acute intracranial abnormality. Electronically Signed   By: Charlett Nose M.D.   On: 03/03/2020 18:45    Cardiac Studies:  ECG: SR LBBB   Telemetry: afib rates 100-120 bpm   Echo: pending   Medications:   . amiodarone  100 mg Oral Daily  . dexamethasone  (DECADRON) injection  6 mg Intravenous Q24H  . furosemide  40 mg Intravenous BID  . metoprolol tartrate  25 mg Oral BID  . Warfarin - Pharmacist Dosing Inpatient   Does not apply q1600     . amiodarone 30 mg/hr (03/04/20 8676)  . azithromycin    . cefTRIAXone (ROCEPHIN)  IV 2 g (03/04/20 0624)  . [START ON 03/05/2020] remdesivir 100 mg in NS 100 mL      Assessment/Plan:   1. CHF:  Mild with minimally elevated BNP continue iv bid lasix follow CXR still with LE edema 2. PAF:  Continue iv amiodarone INR Rx rate ok on bid lopressor  3. COVID:  Rx per primary service has had steroids also on Rocephin   Charlton Haws 03/04/2020, 8:17 AM

## 2020-03-04 NOTE — Plan of Care (Signed)
  Problem: Education: Goal: Knowledge of risk factors and measures for prevention of condition will improve Outcome: Not Progressing   Problem: Coping: Goal: Psychosocial and spiritual needs will be supported Outcome: Not Progressing   Problem: Respiratory: Goal: Will maintain a patent airway Outcome: Not Progressing Goal: Complications related to the disease process, condition or treatment will be avoided or minimized Outcome: Not Progressing   Problem: Education: Goal: Knowledge of disease or condition will improve Outcome: Not Progressing Goal: Understanding of medication regimen will improve Outcome: Not Progressing Goal: Individualized Educational Video(s) Outcome: Not Progressing   Problem: Activity: Goal: Ability to tolerate increased activity will improve Outcome: Not Progressing   Problem: Cardiac: Goal: Ability to achieve and maintain adequate cardiopulmonary perfusion will improve Outcome: Not Progressing   Problem: Health Behavior/Discharge Planning: Goal: Ability to safely manage health-related needs after discharge will improve Outcome: Not Progressing

## 2020-03-04 NOTE — Progress Notes (Signed)
ANTICOAGULATION CONSULT NOTE Pharmacy Consult for Warfarin Indication: atrial fibrillation  Allergies  Allergen Reactions  . Bee Venom Shortness Of Breath    BEE STINGS - dizziness   . Corn-Containing Products Anaphylaxis    Corn bread, pop corn, all corn products----tylenol is OK  . Codeine     REACTION: muscle cramps  . Invert Sugar   . Vanilla Diarrhea    Patient Measurements: Height: 5\' 10"  (177.8 cm) Weight: 97.3 kg (214 lb 8.1 oz) IBW/kg (Calculated) : 73   Vital Signs: Temp: 97.9 F (36.6 C) (04/06 0752) Temp Source: Oral (04/06 0752) BP: 128/98 (04/06 0752) Pulse Rate: 102 (04/06 0810)  Labs: Recent Labs    03/03/20 1146 03/03/20 1235 03/03/20 1257 03/04/20 0249 03/04/20 0709  HGB 14.0  --   --   --  13.7  HCT 43.8  --   --   --  41.8  PLT 299  --   --   --  284  LABPROT  --   --  42.8* 45.1* 43.4*  INR  --   --  4.5* 4.8* 4.6*  CREATININE 1.02  --   --   --  1.01  TROPONINIHS 52* 54*  --   --  52*    Estimated Creatinine Clearance: 72.8 mL/min (by C-G formula based on SCr of 1.01 mg/dL).  Assessment: Patient is a 72 yom that presents to the ED in Afib with RVR. The patient was recently treated at an outside hospital for diarrhea and dehydration. The patient is on warfarin at home and pharmacy has been asked to manage the patient's warfarin while inpatient.   PTA warfarin regimen: 3.75mg  Q Sun, Thu and 2.5mg  ROW    Goal of Therapy:  INR 2-3 Monitor platelets by anticoagulation protocol: Yes   Plan:  - INR currently supra-therapeutic at 4.8  - Will hold warfarin this evening  - Monitor patient for s/s of bleeding - Daily PT-INR and monitor CBC   Thank you Fri, PharmD [phone]  03/04/2020,9:16 AM

## 2020-03-04 NOTE — Plan of Care (Addendum)
Plan of care initiated. MD made aware of INR 4.8 this am.

## 2020-03-04 NOTE — Progress Notes (Signed)
  Echocardiogram 2D Echocardiogram has been performed.  Delcie Roch 03/04/2020, 11:59 AM

## 2020-03-04 NOTE — Progress Notes (Signed)
Patient was seen and examined earlier this morning.  He was admitted by my partner Dr. Toniann Fail.  Patient appears to have COVID-19 pneumonia which likely triggered atrial fibrillation with RVR resulting in decompensated heart failure.  He is currently on room air-appears comfortable.  Repeat procalcitonin this morning is downtrending-I do not think he has bacterial pneumonia hence we will go and discontinue Rocephin/Zithromax.  Continue steroids/remdesivir-cardiology following-patient remains on amiodarone infusion.  Continue plans as outlined by Dr. Toniann Fail.

## 2020-03-04 NOTE — H&P (Signed)
History and Physical    [name] AOZ:[id number] DOB: 09/26/1941 DOA: 03/03/2020  PCP: Suzan Slick, MD  Patient coming from: Home.  Chief Complaint: Was notified that his heart rate was increased.  HPI: [name] is a 79 y.o. male with history of A. fib, tachybradycardia syndrome status post pacemaker placement, cardiomyopathy was notified by the pacemaker company that his heart rate was increased.  He presents to the ER.  Patient states he had recent ER visit when he had diarrhea at that time he was given fluids and since then he has been having lower extremity edema with shortness of breath.  Also complained of some chest tightness.  ED Course: In the ER patient was mildly hypoxic AF with RVR which converted to sinus rhythm.  Patient was started on amiodarone drip after cardiology was consulted.  On exam patient is volume overloaded bilateral lower extremity edema chest x-ray showing multifocal pneumonia.  Empiric antibiotics were started since patient also stated that he has been confused for last 3 days and had not taken his medications CT head was done which was unremarkable.  My time I examined the patient patient has become more alert awake and oriented.  Patient also was started on IV Lasix for CHF.  Patient's eventual test came back positive as Covid positive.  Review of Systems: As per HPI, rest all negative.   Past Medical History:  Diagnosis Date  . Anginal pain (HCC)   . Asthma   . Bronchitis   . 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 Dysrhythmia   . History of epididymitis   . History of recurrent UTIs   . History of ruptured appendix   . Paroxysmal atrial fibrillation (HCC)   . Peptic ulcer disease   . Presence of permanent cardiac pacemaker   . Tachy-brady syndrome (HCC)    PPM (SJM) implanted by Dr Graciela Husbands 10/19/99, gen change 04/03/08    Past Surgical History:  Procedure Laterality Date  . EP IMPLANTABLE DEVICE [name] 11/17/2016   Procedure: PPM Generator Changeout;  Surgeon: Marinus Maw, MD;  Location: [facility] INVASIVE CV LAB;  Service: Cardiovascular;  Laterality: [name];  . INSERT / REPLACE / REMOVE PACEMAKER    . PACEMAKER PLACEMENT  03/2008   [employer], [place]. 5826     reports that he quit smoking about 42 years ago. His smoking use included cigarettes. He started smoking about 60 years ago. He has a 30.00 pack-year smoking history. He has never used smokeless tobacco. He reports that he does not drink alcohol or use drugs.  Allergies  Allergen Reactions  . Bee Venom Shortness Of Breath    BEE STINGS - dizziness   . Corn-Containing Products Anaphylaxis    Corn bread, pop corn, all corn products----tylenol is OK  . Codeine     REACTION: muscle cramps  . Vanilla Diarrhea    Family History  Problem Relation Age of Onset  . Suicidality Mother   . Cancer Father     Prior to Admission medications   Medication Sig Start Date End Date Taking? Authorizing Provider  acetaminophen (TYLENOL) 650 MG CR tablet Take 1,300 mg by mouth every 8 (eight) hours as needed for pain.   Yes [provider]  amiodarone (PACERONE) 200 MG tablet Take 1/2 (one-half) tablet by mouth once daily 12/26/19  Yes Jonelle Sidle, MD  metoprolol tartrate (LOPRESSOR) 25 MG tablet Take 1 tablet by mouth twice daily 12/26/19  Yes Diona Browner,  Aloha Gell, MD  warfarin (COUMADIN) 2.5 MG tablet TAKE 1 TABLET DAILY EXCEPT 1 & 1/2 (ONE & ONE-HALF) TABLETS BY MOUTH ONCE ON SUNDAYS AND THURSDAYS. Patient taking differently: Take 2.5 mg by mouth See admin instructions. Take 1 tablet daily except 1 & 1/2 (one and one half) tablets on Sundays and Thursdays 01/21/20  Yes Satira Sark, MD  Homeopathic Products (ALLERGY MEDICINE PO) Take 1 tablet by mouth as needed (allergies).     [provider]  sertraline (ZOLOFT) 50 MG tablet Take 50 mg by mouth daily.     [provider]    Physical Exam: Constitutional: Moderately built and  nourished. Vitals:   03/03/20 1930 03/03/20 2200 03/04/20 0000 03/04/20 0100  BP: 114/84 101/72    Pulse:  (!) 122    Resp: (!) 25 20 20 18   Temp:      TempSrc:      SpO2:  94%    Weight:  101.1 kg    Height:  5\' 10"  (1.778 m)     Eyes: Anicteric no pallor. ENMT: No discharge from the ears eyes nose or mouth. Neck: No mass felt.  No neck rigidity.  Elevated JVD. Respiratory: No rhonchi or crepitations. Cardiovascular: S1-S2 heard. Abdomen: Soft nontender bowel sounds present. Musculoskeletal: Bilateral lower extremity edema present. Skin: No rash. Neurologic: Alert awake oriented to time place and person.  Moves all extremities. Psychiatric: Appears normal.   Labs on Admission: I have personally reviewed following labs and imaging studies  CBC: Recent Labs  Lab 03/03/20 1146  WBC 9.4  HGB 14.0  HCT 43.8  MCV 91.8  PLT 283   Basic Metabolic Panel: Recent Labs  Lab 03/03/20 1146 03/03/20 1656  NA 140  --   K 4.3  --   CL 107  --   CO2 22  --   GLUCOSE 109*  --   BUN 18  --   CREATININE 1.02  --   CALCIUM 8.9  --   MG  --  2.1   GFR: Estimated Creatinine Clearance: 73.4 mL/min (by C-G formula based on SCr of 1.02 mg/dL). Liver Function Tests: No results for input(s): AST, ALT, ALKPHOS, BILITOT, PROT, ALBUMIN in the last 168 hours. No results for input(s): LIPASE, AMYLASE in the last 168 hours. No results for input(s): AMMONIA in the last 168 hours. Coagulation Profile: Recent Labs  Lab 03/03/20 1257  INR 4.5*   Cardiac Enzymes: No results for input(s): CKTOTAL, CKMB, CKMBINDEX, TROPONINI in the last 168 hours. BNP (last 3 results) No results for input(s): PROBNP in the last 8760 hours. HbA1C: Recent Labs    03/03/20 1656  HGBA1C 6.1*   CBG: No results for input(s): GLUCAP in the last 168 hours. Lipid Profile: Recent Labs    03/03/20 2306  TRIG 163*   Thyroid Function Tests: Recent Labs    03/03/20 1656  TSH 1.094   Anemia  Panel: Recent Labs    03/03/20 2230  FERRITIN 376*   Urine analysis:    Component Value Date/Time   COLORURINE YELLOW 04/29/2009 0153   APPEARANCEUR CLEAR 04/29/2009 0153   LABSPEC 1.005 04/29/2009 0153   PHURINE 6.0 04/29/2009 0153   GLUCOSEU NEGATIVE 04/29/2009 0153   HGBUR SMALL (A) 04/29/2009 0153   BILIRUBINUR NEGATIVE 04/29/2009 0153   KETONESUR 15 (A) 04/29/2009 0153   PROTEINUR NEGATIVE 04/29/2009 0153   UROBILINOGEN 1.0 04/29/2009 0153   NITRITE NEGATIVE 04/29/2009 0153   LEUKOCYTESUR MODERATE (A) 04/29/2009 0153  Sepsis Labs: @LABRCNTIP (procalcitonin:4,lacticidven:4) ) Recent Results (from the past 240 hour(s))  SARS CORONAVIRUS 2 (TAT 6-24 HRS) Nasopharyngeal Nasopharyngeal Swab     Status: Abnormal   Collection Time: 03/03/20  1:58 PM   Specimen: Nasopharyngeal Swab  Result Value Ref Range Status   SARS Coronavirus 2 POSITIVE (A) NEGATIVE Final    Comment: RESULT CALLED TO, READ BACK BY AND VERIFIED WITH: L.VENEGAS RN 1910 03/03/20 MCCORMICK K (NOTE) SARS-CoV-2 target nucleic acids are DETECTED. The SARS-CoV-2 RNA is generally detectable in upper and lower respiratory specimens during the acute phase of infection. Positive results are indicative of the presence of SARS-CoV-2 RNA. Clinical correlation with patient history and other diagnostic information is  necessary to determine patient infection status. Positive results do not rule out bacterial infection or co-infection with other viruses.  The expected result is Negative. Fact Sheet for Patients: 05/03/20 Fact Sheet for Healthcare Providers: HairSlick.no This test is not yet approved or cleared by the quierodirigir.com FDA and  has been authorized for detection and/or diagnosis of SARS-CoV-2 by FDA under an Emergency Use Authorization (EUA). This EUA will remain  in effect (meaning this test can be used) for  the duration of the COVID-19  declaration under Section 564(b)(1) of the Act, 21 U.S.C. section 360bbb-3(b)(1), unless the authorization is terminated or revoked sooner. Performed at [facility], [address], [place], [place]      Radiological Exams on Admission: DG Chest 2 View  Result Date: 03/03/2020 CLINICAL DATA:  Shortness of breath. Left-sided chest pain. EXAM: CHEST - 2 VIEW COMPARISON:  09/06/2013 FINDINGS: Bilateral lower lobe and patchy right upper lobe airspace disease. Possible small right pleural effusion. No left pleural effusion. No pneumothorax. Stable cardiomediastinal silhouette. No aggressive osseous crash that dual lead cardiac pacemaker. No aggressive osseous lesion. IMPRESSION: Bilateral lower lobe and right upper lobe airspace disease concerning for multilobar pneumonia. Followup PA and lateral chest X-ray is recommended in 3-4 weeks following trial of antibiotic therapy to ensure resolution and exclude underlying malignancy. Electronically Signed   By: 11/06/2013   On: 03/03/2020 12:09   CT HEAD WO CONTRAST  Result Date: 03/03/2020 CLINICAL DATA:  Atrial fibrillation, altered mental status EXAM: CT HEAD WITHOUT CONTRAST TECHNIQUE: Contiguous axial images were obtained from the base of the skull through the vertex without intravenous contrast. COMPARISON:  None. FINDINGS: Brain: There is atrophy and chronic small vessel disease changes. No acute intracranial abnormality. Specifically, no hemorrhage, hydrocephalus, mass lesion, acute infarction, or significant intracranial injury. Vascular: No hyperdense vessel or unexpected calcification. Skull: No acute calvarial abnormality. Sinuses/Orbits: Mucosal thickening.  No acute findings. Other: None IMPRESSION: Atrophy, chronic microvascular disease. No acute intracranial abnormality. Electronically Signed   By: 05/03/2020 M.D.   On: 03/03/2020 18:45    EKG: Independently reviewed.  Sinus rhythm LBBB.  Assessment/Plan Principal  Problem:   Acute respiratory failure due to COVID-19 [facility]) Active Problems:   Pacemaker-[employer]   Atrial fibrillation with RVR (HCC)   Acute encephalopathy   Acute respiratory failure with hypoxia (HCC)    1. Acute respiratory failure with hypoxia secondary to Covid infection and CHF exacerbation for which patient is already seen by cardiology and Lasix was placed.  I have started patient on remdesivir will also place patient on Decadron.  Follow inflammatory markers.  Follow intake output Daily weights.  Follow metabolic panel.  I have continued patient on empiric antibiotics for now and can be discontinued if repeat procalcitonin's not showing  increasing trend. 2. A. fib with RVR which is converted to sinus rhythm presently on IV amiodarone.  Also on beta-blockers.  Check TSH.  On Coumadin per pharmacy.  INR is supratherapeutic at 4.5. 3. History of cardiomyopathy being seen by cardiology see #1.  Last EF was 40 to 45% in 2017. 4. History of tachybradycardia syndrome status post pacemaker placement.  Given that patient has acute respiratory failure with A. fib with RVR will need close monitoring for any further worsening.   DVT prophylaxis: Coumadin. Code Status: Full code. Family Communication: Discussed with patient. Disposition Plan: Home. Consults called: Cardiology. Admission status: Inpatient.   Eduard Clos MD [facility] Pager [phone].  If 7PM-7AM, please contact night-coverage [email or web address] Password [email or web address]  03/04/2020, 2:04 AM

## 2020-03-05 DIAGNOSIS — I4891 Unspecified atrial fibrillation: Secondary | ICD-10-CM

## 2020-03-05 DIAGNOSIS — I509 Heart failure, unspecified: Secondary | ICD-10-CM

## 2020-03-05 LAB — COMPREHENSIVE METABOLIC PANEL
ALT: 20 U/L (ref 0–44)
AST: 20 U/L (ref 15–41)
Albumin: 2.6 g/dL — ABNORMAL LOW (ref 3.5–5.0)
Alkaline Phosphatase: 45 U/L (ref 38–126)
Anion gap: 15 (ref 5–15)
BUN: 18 mg/dL (ref 8–23)
CO2: 21 mmol/L — ABNORMAL LOW (ref 22–32)
Calcium: 8.8 mg/dL — ABNORMAL LOW (ref 8.9–10.3)
Chloride: 102 mmol/L (ref 98–111)
Creatinine, Ser: 1 mg/dL (ref 0.61–1.24)
GFR calc Af Amer: >60 mL/min (ref >60)
GFR calc non Af Amer: >60 mL/min (ref >60)
Glucose, Bld: 138 mg/dL — ABNORMAL HIGH (ref 70–99)
Potassium: 4 mmol/L (ref 3.5–5.1)
Sodium: 138 mmol/L (ref 135–145)
Total Bilirubin: 0.6 mg/dL (ref 0.3–1.2)
Total Protein: 6.7 g/dL (ref 6.5–8.1)

## 2020-03-05 LAB — CBC
HCT: 44.5 % (ref 39.0–52.0)
Hemoglobin: 14.2 g/dL (ref 13.0–17.0)
MCH: 29.3 pg (ref 26.0–34.0)
MCHC: 31.9 g/dL (ref 30.0–36.0)
MCV: 91.9 fL (ref 80.0–100.0)
Platelets: 344 10*3/uL (ref 150–400)
RBC: 4.84 MIL/uL (ref 4.22–5.81)
RDW: 14.1 % (ref 11.5–15.5)
WBC: 7.9 10*3/uL (ref 4.0–10.5)
nRBC: 0 % (ref 0.0–0.2)

## 2020-03-05 LAB — PROTIME-INR
INR: 3.8 — ABNORMAL HIGH (ref 0.8–1.2)
Prothrombin Time: 37.6 seconds — ABNORMAL HIGH (ref 11.4–15.2)

## 2020-03-05 LAB — LEGIONELLA PNEUMOPHILA SEROGP 1 UR AG: L. pneumophila Serogp 1 Ur Ag: NEGATIVE

## 2020-03-05 LAB — D-DIMER, QUANTITATIVE: D-Dimer, Quant: 5.63 ug/mL-FEU — ABNORMAL HIGH (ref 0.00–0.50)

## 2020-03-05 LAB — FERRITIN: Ferritin: 333 ng/mL (ref 24–336)

## 2020-03-05 LAB — C-REACTIVE PROTEIN: CRP: 8.6 mg/dL — ABNORMAL HIGH (ref <1.0)

## 2020-03-05 MED ORDER — ALUM & MAG HYDROXIDE-SIMETH 200-200-20 MG/5ML PO SUSP
30.0000 mL | ORAL | Status: DC | PRN
  Administered 2020-03-05 – 2020-03-07 (×4): 30 mL via ORAL
  Filled 2020-03-05 (×4): qty 30

## 2020-03-05 MED ORDER — FUROSEMIDE 40 MG PO TABS
40.0000 mg | ORAL_TABLET | Freq: Every day | ORAL | Status: DC
  Administered 2020-03-05 – 2020-03-08 (×4): 40 mg via ORAL
  Filled 2020-03-05 (×4): qty 1

## 2020-03-05 MED ORDER — SACUBITRIL-VALSARTAN 24-26 MG PO TABS
1.0000 | ORAL_TABLET | Freq: Two times a day (BID) | ORAL | Status: DC
  Administered 2020-03-05 – 2020-03-08 (×7): 1 via ORAL
  Filled 2020-03-05 (×8): qty 1

## 2020-03-05 MED ORDER — AMIODARONE HCL 200 MG PO TABS
200.0000 mg | ORAL_TABLET | Freq: Every day | ORAL | Status: DC
  Administered 2020-03-05 – 2020-03-08 (×4): 200 mg via ORAL
  Filled 2020-03-05 (×4): qty 1

## 2020-03-05 NOTE — Progress Notes (Signed)
Physical Therapy Evaluation  Patient presents with impaired balance, decreased activity tolerance, edema in LEs causing pain in feet. Patient notes baseline knee problems and wears his personal knee sleeves/braces from home "so that knees don't give out." Patient's friend who is staying with patient also has COVID. Patient reports MD anticipate Friday discharge. Recommend continued skilled PT services while in the hospital to progress patient's mobility to modI with RW prior to discharge home and home PT services upon discharge home as well as use of RW for all mobility. Patient's friend is currently watching his pets for him as patient reports 8 pets at home. Recommend assist for IADLs.    03/05/20 1001  PT Visit Information  Last PT Received On 03/05/20  Assistance Needed +1  PT/OT/SLP Co-Evaluation/Treatment Yes  Reason for Co-Treatment Complexity of the patient's impairments (multi-system involvement);Necessary to address cognition/behavior during functional activity  PT goals addressed during session Mobility/safety with mobility;Balance;Proper use of DME  History of Present Illness 79 year old male admitted 03/05/20 when his pacemaker company called to notify him his HR was increased and to go to the ER. Patient reports recent ER visit for diarrhea and was given fluids, since then has had LE edema and SOB. Patient presented this admission with increased HR, volume overload, LE edema. CXR showed PNA. Patient +COVID. IV Lasix started for CHF. Patient with acute respiratory failure with hypoxia secondary to COVID and CHFl. Cardiology following. COIVD PNA likely triggered Afib with RVR and lead to decompensated heart failure. 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  Prior Function  Level of Independence Independent;Independent with assistive device(s)  Comments Independent in the home, use of RW outside the home when shopping or for longer distance ambulation, patient reports 4 major falls (one of which was last year)  Communication  Communication No difficulties  Pain Assessment  Pain Assessment 0-10  Pain Score 3  Pain Location ribs when coughing   Pain Descriptors / Indicators Sore  Pain Intervention(s) Monitored during session;Limited activity within patient's tolerance;Repositioned  Cognition  Arousal/Alertness Awake/alert  Overall Cognitive Status No family/caregiver present to determine baseline cognitive functioning  General Comments Patient made a list for discharge home plans.  Lower Extremity Assessment  Lower Extremity Assessment RLE deficits/detail;LLE deficits/detail  RLE Deficits / Details grossly 4/5, LE edema  LLE Deficits / Details grossly 4/5, LE edema  Bed Mobility  Overal bed mobility Needs Assistance  Bed Mobility Supine to Sit  Supine to sit Supervision;HOB elevated (use of bedrail)  Transfers  Overall transfer level Needs assistance  Equipment used Rolling walker (2 wheeled)  Transfers Sit to/from Stand  Sit to Stand Min guard;Min assist  General transfer comment Cues for hand placement, sit<>stand from EOB, stand>sit on recliner  chair  Ambulation/Gait  Ambulation/Gait assistance Min guard;Supervision  Gait Distance (Feet) 30 Feet (10)  Assistive device Rolling walker (2 wheeled)  Gait Pattern/deviations Decreased step length - right;Decreased step length - left  General Gait Details Patient reports pain in feet due to edema.  Gait velocity decreased  Balance  Overall balance assessment Needs assistance  Sitting-balance support Feet supported  Sitting balance-Leahy Scale Good  Standing balance support Bilateral upper extremity supported;No upper extremity supported  Standing balance-Leahy Scale Fair  General Comments  General comments (skin integrity, edema, etc.) Patient on room air. Oxygen saturation 94% post ambulation in room, HR 74 bpm.   PT - End of Session  Equipment Utilized During Treatment Gait belt  Activity Tolerance Patient limited by pain;Patient limited by fatigue  Patient left in chair;with call bell/phone within reach;with chair alarm set  Nurse Communication Other (comment) (spoke to nurse before PT evaluation, whiteboard updated)  PT Assessment  PT Recommendation/Assessment Patient needs continued PT services  PT Visit Diagnosis Unsteadiness on feet (R26.81);Other abnormalities of gait and mobility (R26.89);Repeated falls (R29.6)  PT Problem List Decreased strength;Decreased activity tolerance;Decreased balance;Decreased mobility;Decreased knowledge of use of DME;Cardiopulmonary status limiting activity  PT Plan  PT Frequency (ACUTE ONLY) Min 3X/week  PT Treatment/Interventions (ACUTE ONLY) DME instruction;Stair training;Gait training;Functional mobility training;Therapeutic activities;Therapeutic exercise;Balance training;Patient/family education  AM-PAC PT "6 Clicks" Mobility Outcome Measure (Version 2)  Help needed turning from your back to your side while in a flat bed without using bedrails? 4  Help needed moving from lying on your back to sitting on the side of a flat bed without using  bedrails? 3  Help needed moving to and from a bed to a chair (including a wheelchair)? 3  Help needed standing up from a chair using your arms (e.g., wheelchair or bedside chair)? 3  Help needed to walk in hospital room? 3  Help needed climbing 3-5 steps with a railing?  2  6 Click Score 18  Consider Recommendation of Discharge To: Home with [facility]  PT Recommendation  Follow Up Recommendations Home health PT;Supervision/Assistance - 24 hour  PT equipment Other (comment) (patient owns RW, recommend use)  Individuals Consulted  Consulted and Agree with Results and Recommendations Patient  Acute Rehab PT Goals  Patient Stated Goal to go home today  PT Goal Formulation With patient  Time For Goal Achievement 03/18/20  Potential to Achieve Goals Good  PT Time Calculation  PT Start Time (ACUTE ONLY) 1001  PT Stop Time (ACUTE ONLY) 1038  PT Time Calculation (min) (ACUTE ONLY) 37 min  PT General Charges  $$ ACUTE PT VISIT 1 Visit  PT Evaluation  $PT Eval Moderate Complexity 1 Mod   Birdie Hopes, PT, DPT Acute Rehab [phone] office

## 2020-03-05 NOTE — Progress Notes (Signed)
PROGRESS NOTE                                                                                                                                                                                                             Patient Demographics:    [name], is a 79 y.o. male, DOB - Sep 01, 1941, JSH:[id number]  Admit date - 03/03/2020   Admitting Physician Eduard Clos, MD  Outpatient Primary MD for the patient is Suzan Slick, MD  LOS - 2   Chief Complaint  Patient presents with  . Chest Pain  . Atrial Fibrillation       Brief Narrative    79 y.o. male with history of A. fib, tachybradycardia syndrome status post pacemaker placement, cardiomyopathy was notified by the pacemaker company that his heart rate was increased.  Presents to ED, noted to be dyspneic with increased work of breathing, with some edema, work-up significant for acute CHF, can [place] to A. fib with RVR, requiring amiodarone drip initially, as well work-up significant for COVID-19 pneumonia.    Subjective:    [name] today has, No headache, No chest pain, No abdominal pain , does report some dyspnea, generalized weakness and cough.    Assessment  & Plan :    Principal Problem:   Acute respiratory failure due to COVID-19 [facility]) Active Problems:   PAF (paroxysmal atrial fibrillation) (HCC)   Pacemaker-[employer]   Atrial fibrillation with RVR (HCC)   Acute encephalopathy   Acute respiratory failure with hypoxia (HCC)  Acute respiratory failure with hypoxia secondary to Covid infection and CHF exacerbation: -This morning patient tolerating room air, saturation in the low 90s. -Continue with IV Decadron. -Continue with IV remdesivir. -He was encouraged to use  incentive spirometry, flutter valve, and to get out of bed to chair . -Continue to trend inflammatory markers .  Acute on chronic systolic CHF . - History of cardiomyopathy being seen by cardiology see  #1.  Last EF was 40 to 45% in 2017. -[name] 2D echo showing significantly reduced EF 25 to 30%, treatment per cardiology, need work-up as an outpatient. -Started on low-dose Entresto. -Treatment per cardiology. -He did require IV Lasix initially, currently transitioned to p.o. Lasix, will monitor closely as he is still having some lower extremity edema, may need an extra Lasix intermittently.  Paroxysmal atrial  fibrillation -Requiring amiodarone drip initially, currently transitioned to oral amiodarone -On warfarin for anticoagulation.  History of tachybradycardia  -Status post pacemaker placement  COVID-19 Labs  Recent Labs    03/03/20 2230 03/04/20 0709 03/05/20 0330  DDIMER 4.83*  --  5.63*  FERRITIN 376* 319 333  LDH 253*  --   --   CRP 12.6* 11.9* 8.6*    Lab Results  Component Value Date   SARSCOV2NAA POSITIVE (A) 03/03/2020     Code Status : Full  Family Communication  : Patient reported he has no close family members to update, on  record Maryellen Pile which is a close friend family, try to call available phone number ,but is not working number.  Disposition Plan  : Home  Barriers For Discharge : She remains on IV steroids, IV remdesivir, currently weak, in CHF, not medically ready for discharge  Consults  : Cardiology  Procedures  : None  DVT Prophylaxis  : On warfarin  Lab Results  Component Value Date   PLT 344 03/05/2020    Antibiotics  :    Anti-infectives (From admission, onward)   Start     Dose/Rate Route Frequency Ordered Stop   03/05/20 1000  remdesivir 100 mg in sodium chloride 0.9 % 100 mL IVPB  Status:  Discontinued     100 mg 200 mL/hr over 30 Minutes Intravenous Daily 03/04/20 0205 03/04/20 0208   03/05/20 1000  remdesivir 100 mg in sodium chloride 0.9 % 100 mL IVPB     100 mg 200 mL/hr over 30 Minutes Intravenous Daily 03/04/20 0210 03/09/20 0959   03/04/20 0800  azithromycin (ZITHROMAX) 500 mg in sodium chloride 0.9 % 250 mL IVPB   Status:  Discontinued     500 mg 250 mL/hr over 60 Minutes Intravenous Every 24 hours 03/04/20 0557 03/04/20 1054   03/04/20 0600  cefTRIAXone (ROCEPHIN) 2 g in sodium chloride 0.9 % 100 mL IVPB  Status:  Discontinued     2 g 200 mL/hr over 30 Minutes Intravenous Every 24 hours 03/04/20 0557 03/04/20 1054   03/04/20 0230  remdesivir 100 mg in sodium chloride 0.9 % 100 mL IVPB     100 mg 200 mL/hr over 30 Minutes Intravenous Every 30 min 03/04/20 0210 03/04/20 0749   03/04/20 0215  remdesivir 200 mg in sodium chloride 0.9% 250 mL IVPB  Status:  Discontinued     200 mg 580 mL/hr over 30 Minutes Intravenous Once 03/04/20 0205 03/04/20 0208        Objective:   Vitals:   03/05/20 0400 03/05/20 0500 03/05/20 0520 03/05/20 0752  BP: 109/69   121/71  Pulse: 60   64  Resp:    19  Temp:   97.8 F (36.6 C) 98.1 F (36.7 C)  TempSrc:   Oral Oral  SpO2: 90%   93%  Weight:  100 kg    Height:        Wt Readings from Last 3 Encounters:  03/05/20 100 kg  10/16/19 104.1 kg  09/06/19 96.2 kg     Intake/Output Summary (Last 24 hours) at 03/05/2020 1155 Last data filed at 03/05/2020 0532 Gross per 24 hour  Intake 952.15 ml  Output 2575 ml  Net -1622.85 ml     Physical Exam  Awake Alert, Oriented X 3, No new F.N deficits, Normal affect Symmetrical Chest wall movement, Good air movement bilaterally, patient with bibasilar Rales RRR,No Gallops,Rubs or new Murmurs, No Parasternal Heave +ve B.Sounds, Abd Soft,  No tenderness, No rebound - guarding or rigidity. No Cyanosis, Clubbing , has trace  edema, No new Rash or bruise      Data Review:    CBC Recent Labs  Lab 03/03/20 1146 03/04/20 0709 03/05/20 0330  WBC 9.4 7.6 7.9  HGB 14.0 13.7 14.2  HCT 43.8 41.8 44.5  PLT 299 284 344  MCV 91.8 89.1 91.9  MCH 29.4 29.2 29.3  MCHC 32.0 32.8 31.9  RDW 14.2 14.3 14.1  LYMPHSABS  --  1.2  --   MONOABS  --  0.7  --   EOSABS  --  0.1  --   BASOSABS  --  0.0  --     Chemistries    Recent Labs  Lab 03/03/20 1146 03/03/20 1656 03/04/20 0709 03/05/20 0330  NA 140  --  137 138  K 4.3  --  3.7 4.0  CL 107  --  100 102  CO2 22  --  22 21*  GLUCOSE 109*  --  114* 138*  BUN 18  --  18 18  CREATININE 1.02  --  1.01 1.00  CALCIUM 8.9  --  8.5* 8.8*  MG  --  2.1 2.0  --   AST  --   --  20 20  ALT  --   --  20 20  ALKPHOS  --   --  45 45  BILITOT  --   --  0.7 0.6   ------------------------------------------------------------------------------------------------------------------ Recent Labs    03/03/20 2306 03/04/20 0249  CHOL  --  156  HDL  --  27*  LDLCALC  --  102*  TRIG 163* 136  CHOLHDL  --  5.8    Lab Results  Component Value Date   HGBA1C 6.1 (H) 03/03/2020   ------------------------------------------------------------------------------------------------------------------ Recent Labs    03/04/20 0709  TSH 2.093   ------------------------------------------------------------------------------------------------------------------ Recent Labs    03/04/20 0709 03/05/20 0330  FERRITIN 319 333    Coagulation profile Recent Labs  Lab 03/03/20 1257 03/04/20 0249 03/04/20 0709  INR 4.5* 4.8* 4.6*    Recent Labs    03/03/20 2230 03/05/20 0330  DDIMER 4.83* 5.63*    Cardiac Enzymes No results for input(s): CKMB, TROPONINI, MYOGLOBIN in the last 168 hours.  Invalid input(s): CK ------------------------------------------------------------------------------------------------------------------    Component Value Date/Time   BNP 214.6 (H) 03/03/2020 1257    Inpatient Medications  Scheduled Meds: . amiodarone  200 mg Oral Daily  . dexamethasone (DECADRON) injection  6 mg Intravenous Q24H  . furosemide  40 mg Oral Daily  . metoprolol tartrate  25 mg Oral BID  . sacubitril-valsartan  1 tablet Oral BID  . Warfarin - Pharmacist Dosing Inpatient   Does not apply q1600   Continuous Infusions: . remdesivir 100 mg in NS 100 mL 100 mg  (03/05/20 0933)   PRN Meds:.acetaminophen  Micro Results Recent Results (from the past 240 hour(s))  SARS CORONAVIRUS 2 (TAT 6-24 HRS) Nasopharyngeal Nasopharyngeal Swab     Status: Abnormal   Collection Time: 03/03/20  1:58 PM   Specimen: Nasopharyngeal Swab  Result Value Ref Range Status   SARS Coronavirus 2 POSITIVE (A) NEGATIVE Final    Comment: RESULT CALLED TO, READ BACK BY AND VERIFIED WITH: L.VENEGAS RN 1910 03/03/20 MCCORMICK K (NOTE) SARS-CoV-2 target nucleic acids are DETECTED. The SARS-CoV-2 RNA is generally detectable in upper and lower respiratory specimens during the acute phase of infection. Positive results are indicative of the presence of SARS-CoV-2 RNA. Clinical correlation  with patient history and other diagnostic information is  necessary to determine patient infection status. Positive results do not rule out bacterial infection or co-infection with other viruses.  The expected result is Negative. Fact Sheet for Patients: HairSlick.no Fact Sheet for Healthcare Providers: quierodirigir.com This test is not yet approved or cleared by the [place] FDA and  has been authorized for detection and/or diagnosis of SARS-CoV-2 by FDA under an Emergency Use Authorization (EUA). This EUA will remain  in effect (meaning this test can be used) for  the duration of the COVID-19 declaration under Section 564(b)(1) of the Act, 21 U.S.C. section 360bbb-3(b)(1), unless the authorization is terminated or revoked sooner. Performed at [facility], [address], [place], [place] [address]     Radiology Reports DG Chest 2 View  Result Date: 03/03/2020 CLINICAL DATA:  Shortness of breath. Left-sided chest pain. EXAM: CHEST - 2 VIEW COMPARISON:  09/06/2013 FINDINGS: Bilateral lower lobe and patchy right upper lobe airspace disease. Possible small right pleural effusion. No left pleural effusion. No pneumothorax.  Stable cardiomediastinal silhouette. No aggressive osseous crash that dual lead cardiac pacemaker. No aggressive osseous lesion. IMPRESSION: Bilateral lower lobe and right upper lobe airspace disease concerning for multilobar pneumonia. Followup PA and lateral chest X-ray is recommended in 3-4 weeks following trial of antibiotic therapy to ensure resolution and exclude underlying malignancy. Electronically Signed   By: Elige Ko   On: 03/03/2020 12:09   CT HEAD WO CONTRAST  Result Date: 03/03/2020 CLINICAL DATA:  Atrial fibrillation, altered mental status EXAM: CT HEAD WITHOUT CONTRAST TECHNIQUE: Contiguous axial images were obtained from the base of the skull through the vertex without intravenous contrast. COMPARISON:  None. FINDINGS: Brain: There is atrophy and chronic small vessel disease changes. No acute intracranial abnormality. Specifically, no hemorrhage, hydrocephalus, mass lesion, acute infarction, or significant intracranial injury. Vascular: No hyperdense vessel or unexpected calcification. Skull: No acute calvarial abnormality. Sinuses/Orbits: Mucosal thickening.  No acute findings. Other: None IMPRESSION: Atrophy, chronic microvascular disease. No acute intracranial abnormality. Electronically Signed   By: Charlett Nose M.D.   On: 03/03/2020 18:45   ECHOCARDIOGRAM LIMITED  Result Date: 03/04/2020    ECHOCARDIOGRAM LIMITED REPORT   Patient Name:   EBERT FORRESTER Date of Exam: 03/04/2020 Medical Rec #:  [id number]       Height:       70.0 in Accession #:    [id number]      Weight:       214.5 lb Date of Birth:  [birth year]       BSA:          2.150 m Patient Age:    78 years        BP:           128/98 mmHg Patient Gender: M               HR:           109 bpm. Exam Location:  Inpatient Procedure: Limited Color Doppler, Cardiac Doppler and Limited Echo Indications:    chest pain 786.50  History:        Patient has prior history of Echocardiogram examinations, most                 recent 06/16/2016.  Pacemaker, Covid; Arrythmias:Atrial                 Fibrillation.  Sonographer:    Delcie Roch Referring Phys: [id number] Eduard Clos  IMPRESSIONS  1. Left ventricular ejection fraction, by estimation, is 25 to 30%. The left ventricle has severely decreased function. The left ventricle demonstrates global hypokinesis. Left ventricular diastolic function could not be evaluated.  2. Right ventricular systolic function was not well visualized. The right ventricular size is normal. There is normal pulmonary artery systolic pressure. The estimated right ventricular systolic pressure is 21.3 mmHg.  3. Left atrial size was mildly dilated.  4. The mitral valve is normal in structure. Trivial mitral valve regurgitation.  5. The aortic valve is normal in structure. Aortic valve regurgitation is not visualized.  6. The inferior vena cava is normal in size with greater than 50% respiratory variability, suggesting right atrial pressure of 3 mmHg. Comparison(s): Prior images reviewed side by side. Changes from prior study are noted. The left ventricular function is worsened. FINDINGS  Left Ventricle: Left ventricular ejection fraction, by estimation, is 25 to 30%. The left ventricle has severely decreased function. The left ventricle demonstrates global hypokinesis. Abnormal (paradoxical) septal motion, consistent with left bundle branch block. Left ventricular diastolic function could not be evaluated due to atrial fibrillation. Right Ventricle: The right ventricular size is normal. Right vetricular wall thickness was not assessed. Right ventricular systolic function was not well visualized. There is normal pulmonary artery systolic pressure. The tricuspid regurgitant velocity is 2.14 m/s, and with an assumed right atrial pressure of 3 mmHg, the estimated right ventricular systolic pressure is 21.3 mmHg. Left Atrium: Left atrial size was mildly dilated. Right Atrium: Right atrial size was not well visualized. Pericardium:  There is no evidence of pericardial effusion. Mitral Valve: The mitral valve is normal in structure. Trivial mitral valve regurgitation. Tricuspid Valve: The tricuspid valve is normal in structure. Tricuspid valve regurgitation is trivial. Aortic Valve: The aortic valve is normal in structure. Aortic valve regurgitation is not visualized. Pulmonic Valve: The pulmonic valve was not well visualized. Pulmonic valve regurgitation is trivial. Venous: The inferior vena cava is normal in size with greater than 50% respiratory variability, suggesting right atrial pressure of 3 mmHg. IAS/Shunts: The interatrial septum was not well visualized.  LEFT VENTRICLE PLAX 2D LVIDd:         3.70 cm LVIDs:         3.30 cm LV PW:         1.00 cm LV IVS:        1.10 cm LVOT diam:     1.90 cm LVOT Area:     2.84 cm  LEFT ATRIUM         Index LA diam:    3.70 cm 1.72 cm/m  TRICUSPID VALVE TR Peak grad:   18.3 mmHg TR Vmax:        214.00 cm/s  SHUNTS Systemic Diam: 1.90 cm Thurmon Fair MD Electronically signed by Thurmon Fair MD Signature Date/Time: 03/04/2020/1:46:24 PM    Final       Huey Bienenstock M.D on 03/05/2020 at 11:55 AM  Between 7am to 7pm - Pager - [phone]  After 7pm go to [email or web address] - password [email or web address]  [facility] -  Office  [phone]

## 2020-03-05 NOTE — Progress Notes (Signed)
Subjective:  Denies SSCP, palpitations or Dyspnea Wants more food   Objective:  Vitals:   03/05/20 0400 03/05/20 0500 03/05/20 0520 03/05/20 0752  BP: 109/69   121/71  Pulse: 60   64  Resp:    19  Temp:   97.8 F (36.6 C) 98.1 F (36.7 C)  TempSrc:   Oral Oral  SpO2: 90%   93%  Weight:  100 kg    Height:        Intake/Output from previous day:  Intake/Output Summary (Last 24 hours) at 03/05/2020 0852 Last data filed at 03/05/2020 0532 Gross per 24 hour  Intake 1370.27 ml  Output 2875 ml  Net -1504.73 ml    Physical Exam: Affect appropriate Chronically ill  HEENT: normal Neck supple with no adenopathy JVP normal no bruits no thyromegaly Lungs basilar crackles  Heart:  S1/S2 no murmur, no rub, gallop or click PMI normal pacer under left clavicle  Abdomen: benighn, BS positve, no tenderness, no AAA no bruit.  No HSM or HJR Distal pulses intact with no bruits Plus 2 LE edema  Neuro non-focal Skin warm and dry No muscular weakness   Lab Results: Basic Metabolic Panel: Recent Labs    03/03/20 1146 03/03/20 1656 03/04/20 0709 03/05/20 0330  NA   < >  --  137 138  K   < >  --  3.7 4.0  CL   < >  --  100 102  CO2   < >  --  22 21*  GLUCOSE   < >  --  114* 138*  BUN   < >  --  18 18  CREATININE   < >  --  1.01 1.00  CALCIUM   < >  --  8.5* 8.8*  MG  --  2.1 2.0  --    < > = values in this interval not displayed.   Liver Function Tests: Recent Labs    03/04/20 0709 03/05/20 0330  AST 20 20  ALT 20 20  ALKPHOS 45 45  BILITOT 0.7 0.6  PROT 6.4* 6.7  ALBUMIN 2.4* 2.6*   No results for input(s): LIPASE, AMYLASE in the last 72 hours. CBC: Recent Labs    03/04/20 0709 03/05/20 0330  WBC 7.6 7.9  NEUTROABS 5.6  --   HGB 13.7 14.2  HCT 41.8 44.5  MCV 89.1 91.9  PLT 284 344   Cardiac Enzymes: No results for input(s): CKTOTAL, CKMB, CKMBINDEX, TROPONINI in the last 72 hours. BNP: Invalid input(s): POCBNP D-Dimer: Recent Labs     03/03/20 2230 03/05/20 0330  DDIMER 4.83* 5.63*   Hemoglobin A1C: Recent Labs    03/03/20 1656  HGBA1C 6.1*   Fasting Lipid Panel: Recent Labs    03/04/20 0249  CHOL 156  HDL 27*  LDLCALC 102*  TRIG 136  CHOLHDL 5.8   Thyroid Function Tests: Recent Labs    03/04/20 0709  TSH 2.093   Anemia Panel: Recent Labs    03/05/20 0330  FERRITIN 333    Imaging: DG Chest 2 View  Result Date: 03/03/2020 CLINICAL DATA:  Shortness of breath. Left-sided chest pain. EXAM: CHEST - 2 VIEW COMPARISON:  09/06/2013 FINDINGS: Bilateral lower lobe and patchy right upper lobe airspace disease. Possible small right pleural effusion. No left pleural effusion. No pneumothorax. Stable cardiomediastinal silhouette. No aggressive osseous crash that dual lead cardiac pacemaker. No aggressive osseous lesion. IMPRESSION: Bilateral lower lobe and right upper lobe airspace disease concerning for  multilobar pneumonia. Followup PA and lateral chest X-ray is recommended in 3-4 weeks following trial of antibiotic therapy to ensure resolution and exclude underlying malignancy. Electronically Signed   By: Kathreen Devoid   On: 03/03/2020 12:09   CT HEAD WO CONTRAST  Result Date: 03/03/2020 CLINICAL DATA:  Atrial fibrillation, altered mental status EXAM: CT HEAD WITHOUT CONTRAST TECHNIQUE: Contiguous axial images were obtained from the base of the skull through the vertex without intravenous contrast. COMPARISON:  None. FINDINGS: Brain: There is atrophy and chronic small vessel disease changes. No acute intracranial abnormality. Specifically, no hemorrhage, hydrocephalus, mass lesion, acute infarction, or significant intracranial injury. Vascular: No hyperdense vessel or unexpected calcification. Skull: No acute calvarial abnormality. Sinuses/Orbits: Mucosal thickening.  No acute findings. Other: None IMPRESSION: Atrophy, chronic microvascular disease. No acute intracranial abnormality. Electronically Signed   By: Rolm Baptise M.D.   On: 03/03/2020 18:45   ECHOCARDIOGRAM LIMITED  Result Date: 03/04/2020    ECHOCARDIOGRAM LIMITED REPORT   Patient Name:   [name] Date of Exam: 03/04/2020 Medical Rec #:  [id number]       Height:       70.0 in Accession #:    [id number]      Weight:       214.5 lb Date of Birth:  February 16, 1941       BSA:          2.150 m Patient Age:    79 years        BP:           128/98 mmHg Patient Gender: M               HR:           109 bpm. Exam Location:  Inpatient Procedure: Limited Color Doppler, Cardiac Doppler and Limited Echo Indications:    chest pain 786.50  History:        Patient has prior history of Echocardiogram examinations, most                 recent 06/16/2016. Pacemaker, Covid; Arrythmias:Atrial                 Fibrillation.  Sonographer:    Johny Chess Referring Phys: [place]  1. Left ventricular ejection fraction, by estimation, is 25 to 30%. The left ventricle has severely decreased function. The left ventricle demonstrates global hypokinesis. Left ventricular diastolic function could not be evaluated.  2. Right ventricular systolic function was not well visualized. The right ventricular size is normal. There is normal pulmonary artery systolic pressure. The estimated right ventricular systolic pressure is 93.2 mmHg.  3. Left atrial size was mildly dilated.  4. The mitral valve is normal in structure. Trivial mitral valve regurgitation.  5. The aortic valve is normal in structure. Aortic valve regurgitation is not visualized.  6. The inferior vena cava is normal in size with greater than 50% respiratory variability, suggesting right atrial pressure of 3 mmHg. Comparison(s): Prior images reviewed side by side. Changes from prior study are noted. The left ventricular function is worsened. FINDINGS  Left Ventricle: Left ventricular ejection fraction, by estimation, is 25 to 30%. The left ventricle has severely decreased function. The left ventricle  demonstrates global hypokinesis. Abnormal (paradoxical) septal motion, consistent with left bundle branch block. Left ventricular diastolic function could not be evaluated due to atrial fibrillation. Right Ventricle: The right ventricular size is normal. Right vetricular wall thickness was not assessed. Right ventricular  systolic function was not well visualized. There is normal pulmonary artery systolic pressure. The tricuspid regurgitant velocity is 2.14 m/s, and with an assumed right atrial pressure of 3 mmHg, the estimated right ventricular systolic pressure is 21.3 mmHg. Left Atrium: Left atrial size was mildly dilated. Right Atrium: Right atrial size was not well visualized. Pericardium: There is no evidence of pericardial effusion. Mitral Valve: The mitral valve is normal in structure. Trivial mitral valve regurgitation. Tricuspid Valve: The tricuspid valve is normal in structure. Tricuspid valve regurgitation is trivial. Aortic Valve: The aortic valve is normal in structure. Aortic valve regurgitation is not visualized. Pulmonic Valve: The pulmonic valve was not well visualized. Pulmonic valve regurgitation is trivial. Venous: The inferior vena cava is normal in size with greater than 50% respiratory variability, suggesting right atrial pressure of 3 mmHg. IAS/Shunts: The interatrial septum was not well visualized.  LEFT VENTRICLE PLAX 2D LVIDd:         3.70 cm LVIDs:         3.30 cm LV PW:         1.00 cm LV IVS:        1.10 cm LVOT diam:     1.90 cm LVOT Area:     2.84 cm  LEFT ATRIUM         Index LA diam:    3.70 cm 1.72 cm/m  TRICUSPID VALVE TR Peak grad:   18.3 mmHg TR Vmax:        214.00 cm/s  SHUNTS Systemic Diam: 1.90 cm Mihai Croitoru MD Electronically signed by Thurmon Fair MD Signature Date/Time: 03/04/2020/1:46:24 PM    Final     Cardiac Studies:  ECG: SR LBBB   Telemetry: afib rates 100-120 bpm   Echo: pending   Medications:   . dexamethasone (DECADRON) injection  6 mg Intravenous  Q24H  . furosemide  40 mg Intravenous BID  . metoprolol tartrate  25 mg Oral BID  . Warfarin - Pharmacist Dosing Inpatient   Does not apply q1600     . amiodarone 30 mg/hr (03/05/20 0553)  . remdesivir 100 mg in NS 100 mL      Assessment/Plan:   1. 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 PAF:  Change to normal home PO dose of amiodarone will need outpatient PFTls/DLCO after recovering from COVID  3. COVID:  Rx per primary service has had steroids  And remdesivir   Will arrange outpatient f/u with Dr Graciela Husbands and Rosaura Carpenter [facility] 03/05/2020, 8:52 AM

## 2020-03-05 NOTE — Progress Notes (Signed)
Occupational Therapy Evaluation:  Clinical Impressions: PTA, pt lives with friend in mobile home. Pt reports Independence with ADLs, iADLs, and mobility in the home. Pt reports using RW for long distance ambulation, such as shopping. Pt received on RA, eager to participate and chatty throughout. Pt Supervision for bed mobility, sit to stand transfer with RW. Pt min guard for mobility in room with RW and stand pivot to recliner chair with cues on RW mgmt needed. Pt able to doff/don socks sitting EOB Supervision level using figure four position. Suspect no OT needs at DC, though recommend intermittent supervision and assist with IADLs in the home. (Pt's friend is quarantining at home with COVID-19 currently.) Also recommend pt to use RW in the home at DC, as well. Will continue to follow acutely to maximize independence and safety prior to DC home.    03/05/20 0900  OT Visit Information  Last OT Received On 03/05/20  Assistance Needed +1  Reason for Co-Treatment Complexity of the patient's impairments (multi-system involvement);Necessary to address cognition/behavior during functional activity  History of Present Illness [name] is a 79 y.o. male with history of A. fib, tachybradycardia syndrome status post pacemaker placement, cardiomyopathy was notified by the pacemaker company that his heart rate was increased.  He presents to the ER.  Patient states he had recent ER visit when he had diarrhea at that time he was given fluids and since then he has been having lower extremity edema with shortness of breath.  Also complained of some chest tightness.  Precautions  Precautions Fall;Other (comment)  Precaution Comments COVID+  Required Braces or Orthoses Other Brace  Other Brace patient wears his personal neoprene type knee sleeves bilat  Restrictions  Weight Bearing Restrictions No  Home Living  Family/patient expects to be discharged to: Private residence  Living Arrangements  Non-relatives/Friends (a friend )  Available Help at Discharge Friend(s)  Type of Home Mobile home  Home Access Stairs to enter;Ramped entrance (3 steps in front door, ramp at back door )  Entrance Stairs-Number of Steps 3 (front porch)  Entrance Stairs-Rails Can reach both  Home Layout One level  Bathroom Mining engineer - 2 wheels (reports plan to get quad cane )  Prior Function  Level of Independence Independent with assistive device(s)  Comments Independent with ADLs and mobility in the home, uses RW outside the home when shopping, for longer distances   Communication  Communication No difficulties  Pain Assessment  Pain Assessment 0-10  Pain Score 3  Pain Location ribs when coughing   Pain Descriptors / Indicators Sore (when coughing)  Pain Intervention(s) Limited activity within patient's tolerance;Monitored during session  Cognition  Arousal/Alertness Awake/alert  Behavior During Therapy [facility] for tasks assessed/performed (a little distractible )  Overall Cognitive Status No family/caregiver present to determine baseline cognitive functioning  General Comments Pt overall oriented  Upper Extremity Assessment  Upper Extremity Assessment Overall WFL for tasks assessed  Lower Extremity Assessment  Lower Extremity Assessment Defer to PT evaluation  ADL  Overall ADL's  Needs assistance/impaired  Eating/Feeding Independent;Sitting  Grooming Supervision/safety;Standing  Upper Body Bathing Independent;Sitting  Lower Body Bathing Supervison/ safety;Sit to/from stand  Upper Body Dressing  Independent;Sitting  Lower Body Dressing Supervision/safety;Sitting/lateral leans;Sit to/from stand  Lower Body Dressing Details (indicate cue type and reason) Supervision with cues for attending to task, but pt able to don/doff B socks sitting EOB using figure 4 position  Toilet Transfer Min guard;Ambulation;Stand-pivot  Toileting-  Water quality scientist and Hygiene Supervision/safety  Functional mobility during ADLs Min guard;Rolling walker  General ADL Comments Pt with mild safety concerns for tasks in standing due to unsteadiness and distractability. Anticipate pt to improve prior to DC home   Vision- History  Baseline Vision/History Wears glasses  Wears Glasses Reading only  Patient Visual Report No change from baseline  Bed Mobility  Overal bed mobility Needs Assistance  Bed Mobility Supine to Sit  Supine to sit Supervision;HOB elevated  Transfers  Overall transfer level Needs assistance  Equipment used Rolling walker (2 wheeled)  Transfers Sit to/from [employer] Transfers  Sit to Stand Min guard  Stand pivot transfers Min guard  General transfer comment Cues for hand placement   Balance  Overall balance assessment Needs assistance  Sitting-balance support Feet supported  Sitting balance-Leahy Scale Good  Standing balance support Bilateral upper extremity supported  Standing balance-Leahy Scale Fair  General Comments  General comments (skin integrity, edema, etc.) Patient on room air. Oxygen saturation 94% post ambulation in room, HR 74 bpm.   OT - End of Session  Equipment Utilized During Treatment Gait belt;Rolling walker  Activity Tolerance Patient tolerated treatment well  Patient left in chair;with call bell/phone within reach;with chair alarm set  Nurse Communication Mobility status  OT Assessment  OT Recommendation/Assessment Patient needs continued OT Services  OT Visit Diagnosis Unsteadiness on feet (R26.81);History of falling (Z91.81)  OT Problem List Decreased activity tolerance;Impaired balance (sitting and/or standing);Decreased safety awareness;Decreased knowledge of use of DME or AE  OT Plan  OT Frequency (ACUTE ONLY) Min 3X/week  OT Treatment/Interventions (ACUTE ONLY) Self-care/ADL training;Therapeutic exercise;Energy conservation;DME and/or AE instruction;Therapeutic  activities;Patient/family education  AM-PAC OT "6 Clicks" Daily Activity Outcome Measure (Version 2)  Help from another person eating meals? 4  Help from another person taking care of personal grooming? 3  Help from another person toileting, which includes using toliet, bedpan, or urinal? 3  Help from another person bathing (including washing, rinsing, drying)? 3  Help from another person to put on and taking off regular upper body clothing? 4  Help from another person to put on and taking off regular lower body clothing? 3  6 Click Score 20  OT Recommendation  Follow Up Recommendations No OT follow up;Supervision - Intermittent  OT Equipment None recommended by OT  Individuals Consulted  Consulted and Agree with Results and Recommendations Patient  Acute Rehab OT Goals  Patient Stated Goal go home today   OT Goal Formulation With patient  Time For Goal Achievement 03/19/20  Potential to Achieve Goals Good  OT Time Calculation  OT Start Time (ACUTE ONLY) 0940  OT Stop Time (ACUTE ONLY) 1035  OT Time Calculation (min) 55 min  OT General Charges  $OT Visit 1 Visit  OT Evaluation  $OT Eval Moderate Complexity 1 Mod  OT Treatments  $Self Care/Home Management  8-22 mins  Written Expression  Dominant Hand Right

## 2020-03-05 NOTE — Progress Notes (Signed)
ANTICOAGULATION CONSULT NOTE Pharmacy Consult for Warfarin Indication: atrial fibrillation  Allergies  Allergen Reactions  . Bee Venom Shortness Of Breath    BEE STINGS - dizziness   . Corn-Containing Products Anaphylaxis    Corn bread, pop corn, all corn products----tylenol is OK  . Codeine     REACTION: muscle cramps  . Invert Sugar   . Vanilla Diarrhea    Patient Measurements: Height: 5\' 10"  (177.8 cm) Weight: 100 kg (220 lb 7.4 oz) IBW/kg (Calculated) : 73   Vital Signs: Temp: 98.1 F (36.7 C) (04/07 0752) Temp Source: Oral (04/07 0752) BP: 121/71 (04/07 0752) Pulse Rate: 64 (04/07 0752)  Labs: Recent Labs    03/03/20 1146 03/03/20 1146 03/03/20 1235 03/03/20 1257 03/04/20 0249 03/04/20 0709 03/05/20 0330 03/05/20 1157  HGB 14.0   < >  --   --   --  13.7 14.2  --   HCT 43.8  --   --   --   --  41.8 44.5  --   PLT 299  --   --   --   --  284 344  --   LABPROT  --   --   --    < > 45.1* 43.4*  --  37.6*  INR  --   --   --    < > 4.8* 4.6*  --  3.8*  CREATININE 1.02  --   --   --   --  1.01 1.00  --   TROPONINIHS 52*  --  54*  --   --  52*  --   --    < > = values in this interval not displayed.    Estimated Creatinine Clearance: 74.5 mL/min (by C-G formula based on SCr of 1 mg/dL).  Assessment: Patient is a 12 yom that presents to the ED in Afib with RVR. The patient was recently treated at an outside hospital for diarrhea and dehydration. The patient is on warfarin at home and pharmacy has been asked to manage the patient's warfarin while inpatient.   PTA warfarin regimen: 3.75mg  Q Sun, Thu and 2.5mg  ROW   INR trending down - > 3.8   Goal of Therapy:  INR 2-3 Monitor platelets by anticoagulation protocol: Yes   Plan:  - Will hold warfarin this evening  - Monitor patient for s/s of bleeding - Daily PT-INR and monitor CBC   Thank you Thu, PharmD [phone]  03/05/2020,1:06 PM

## 2020-03-06 DIAGNOSIS — Z950 Presence of cardiac pacemaker: Secondary | ICD-10-CM

## 2020-03-06 DIAGNOSIS — I5043 Acute on chronic combined systolic (congestive) and diastolic (congestive) heart failure: Secondary | ICD-10-CM

## 2020-03-06 LAB — D-DIMER, QUANTITATIVE: D-Dimer, Quant: 4.78 ug/mL-FEU — ABNORMAL HIGH (ref 0.00–0.50)

## 2020-03-06 LAB — CBC
HCT: 44.2 % (ref 39.0–52.0)
Hemoglobin: 14.7 g/dL (ref 13.0–17.0)
MCH: 29.7 pg (ref 26.0–34.0)
MCHC: 33.3 g/dL (ref 30.0–36.0)
MCV: 89.3 fL (ref 80.0–100.0)
Platelets: 418 10*3/uL — ABNORMAL HIGH (ref 150–400)
RBC: 4.95 MIL/uL (ref 4.22–5.81)
RDW: 14 % (ref 11.5–15.5)
WBC: 9.6 10*3/uL (ref 4.0–10.5)
nRBC: 0 % (ref 0.0–0.2)

## 2020-03-06 LAB — COMPREHENSIVE METABOLIC PANEL
ALT: 18 U/L (ref 0–44)
AST: 18 U/L (ref 15–41)
Albumin: 2.7 g/dL — ABNORMAL LOW (ref 3.5–5.0)
Alkaline Phosphatase: 47 U/L (ref 38–126)
Anion gap: 12 (ref 5–15)
BUN: 22 mg/dL (ref 8–23)
CO2: 23 mmol/L (ref 22–32)
Calcium: 8.7 mg/dL — ABNORMAL LOW (ref 8.9–10.3)
Chloride: 101 mmol/L (ref 98–111)
Creatinine, Ser: 0.96 mg/dL (ref 0.61–1.24)
GFR calc Af Amer: >60 mL/min (ref >60)
GFR calc non Af Amer: >60 mL/min (ref >60)
Glucose, Bld: 128 mg/dL — ABNORMAL HIGH (ref 70–99)
Potassium: 3.9 mmol/L (ref 3.5–5.1)
Sodium: 136 mmol/L (ref 135–145)
Total Bilirubin: 0.6 mg/dL (ref 0.3–1.2)
Total Protein: 6.4 g/dL — ABNORMAL LOW (ref 6.5–8.1)

## 2020-03-06 LAB — PROTIME-INR
INR: 3.2 — ABNORMAL HIGH (ref 0.8–1.2)
Prothrombin Time: 33 seconds — ABNORMAL HIGH (ref 11.4–15.2)

## 2020-03-06 LAB — C-REACTIVE PROTEIN: CRP: 4.3 mg/dL — ABNORMAL HIGH (ref <1.0)

## 2020-03-06 LAB — FERRITIN: Ferritin: 323 ng/mL (ref 24–336)

## 2020-03-06 MED ORDER — DOXYCYCLINE HYCLATE 100 MG PO TABS
100.0000 mg | ORAL_TABLET | Freq: Two times a day (BID) | ORAL | Status: DC
  Administered 2020-03-06 – 2020-03-08 (×5): 100 mg via ORAL
  Filled 2020-03-06 (×4): qty 1

## 2020-03-06 MED ORDER — ENTRESTO 24-26 MG PO TABS: 1.0000 | ORAL_TABLET | Freq: Two times a day (BID) | ORAL | 0 refills | Status: DC

## 2020-03-06 MED ORDER — WARFARIN SODIUM 2.5 MG PO TABS
2.5000 mg | ORAL_TABLET | Freq: Once | ORAL | Status: AC
  Administered 2020-03-06: 2.5 mg via ORAL
  Filled 2020-03-06: qty 1

## 2020-03-06 MED FILL — ENTRESTO 24 MG-26 MG TABLET: 24-26 | 30 days supply | Qty: 60 | Fill #0

## 2020-03-06 NOTE — Progress Notes (Signed)
PROGRESS NOTE                                                                                                                                                                                                             Patient Demographics:    [name], is a 79 y.o. male, DOB - 07-30-1941, BUL:[id number]  Admit date - 03/03/2020   Admitting Physician Rise Patience, MD  Outpatient Primary MD for the patient is Leeanne Rio, MD  LOS - 3   Chief Complaint  Patient presents with  . Chest Pain  . Atrial Fibrillation       Brief Narrative    79 y.o. male with history of A. fib, tachybradycardia syndrome status post pacemaker placement, cardiomyopathy was notified by the pacemaker company that his heart rate was increased.  Presents to ED, noted to be dyspneic with increased work of breathing, with some edema, work-up significant for acute CHF, can [place] to A. fib with RVR, requiring amiodarone drip initially, as well work-up significant for COVID-19 pneumonia.    Subjective:    [name] today has, No headache, No chest pain, No abdominal pain , ports dyspnea and cough has improved.    Assessment  & Plan :    Principal Problem:   Acute respiratory failure due to COVID-19 [facility]) Active Problems:   PAF (paroxysmal atrial fibrillation) (HCC)   Pacemaker-[employer]   Atrial fibrillation with RVR (HCC)   Acute encephalopathy   Acute respiratory failure with hypoxia (HCC)  Acute respiratory failure with hypoxia secondary to Covid infection and CHF exacerbation: -This morning patient tolerating room air, saturation in the low 90s. -Continue with IV Decadron. -Continue with IV remdesivir. -He was encouraged to use  incentive spirometry, flutter valve, and to get out of bed to chair . -Continue to trend inflammatory markers .  Acute on chronic systolic CHF . - History of cardiomyopathy being seen by cardiology see #1.  Last EF was 40  to 45% in 2017. -[name] 2D echo showing significantly reduced EF 25 to 30%, treatment per cardiology, need work-up as an outpatient. -Started on low-dose Entresto. -Treatment per cardiology. -He did require IV Lasix initially, currently transitioned to p.o. Lasix, will monitor closely as he is still having some lower extremity edema, may need an extra Lasix intermittently.  Paroxysmal atrial fibrillation -Requiring  amiodarone drip initially, currently transitioned to oral amiodarone -On warfarin for anticoagulation.  Tick bite -Patient noted to have small rash on left lower extremity, reports he noted a tick bite 5 days ago when he removed the tick, will start on doxycycline 100 mg p.o. twice daily for prophylaxis.  History of tachybradycardia  -Status post pacemaker placement  COVID-19 Labs  Recent Labs    03/03/20 2230 03/03/20 2230 03/04/20 0709 03/05/20 0330 03/06/20 0243  DDIMER 4.83*  --   --  5.63* 4.78*  FERRITIN 376*   < > 319 333 323  LDH 253*  --   --   --   --   CRP 12.6*   < > 11.9* 8.6* 4.3*   < > = values in this interval not displayed.    Lab Results  Component Value Date   SARSCOV2NAA POSITIVE (A) 03/03/2020     Code Status : Full  Family Communication  : Patient reported he has no close family members to update.  Disposition Plan  : Home  Barriers For Discharge : She remains on IV steroids, IV remdesivir.  Consults  : Cardiology  Procedures  : None  DVT Prophylaxis  : On warfarin  Lab Results  Component Value Date   PLT 418 (H) 03/06/2020    Antibiotics  :    Anti-infectives (From admission, onward)   Start     Dose/Rate Route Frequency Ordered Stop   03/05/20 1000  remdesivir 100 mg in sodium chloride 0.9 % 100 mL IVPB  Status:  Discontinued     100 mg 200 mL/hr over 30 Minutes Intravenous Daily 03/04/20 0205 03/04/20 0208   03/05/20 1000  remdesivir 100 mg in sodium chloride 0.9 % 100 mL IVPB     100 mg 200 mL/hr over 30 Minutes  Intravenous Daily 03/04/20 0210 03/09/20 0959   03/04/20 0800  azithromycin (ZITHROMAX) 500 mg in sodium chloride 0.9 % 250 mL IVPB  Status:  Discontinued     500 mg 250 mL/hr over 60 Minutes Intravenous Every 24 hours 03/04/20 0557 03/04/20 1054   03/04/20 0600  cefTRIAXone (ROCEPHIN) 2 g in sodium chloride 0.9 % 100 mL IVPB  Status:  Discontinued     2 g 200 mL/hr over 30 Minutes Intravenous Every 24 hours 03/04/20 0557 03/04/20 1054   03/04/20 0230  remdesivir 100 mg in sodium chloride 0.9 % 100 mL IVPB     100 mg 200 mL/hr over 30 Minutes Intravenous Every 30 min 03/04/20 0210 03/04/20 0749   03/04/20 0215  remdesivir 200 mg in sodium chloride 0.9% 250 mL IVPB  Status:  Discontinued     200 mg 580 mL/hr over 30 Minutes Intravenous Once 03/04/20 0205 03/04/20 0208        Objective:   Vitals:   03/06/20 0000 03/06/20 0400 03/06/20 0500 03/06/20 0814  BP: 96/71 105/70  106/75  Pulse: 60 60  67  Resp: 15 17  19   Temp:  (!) 97.5 F (36.4 C)  97.6 F (36.4 C)  TempSrc:  Oral  Oral  SpO2: (!) 87% 97%  92%  Weight:   98.6 kg   Height:        Wt Readings from Last 3 Encounters:  03/06/20 98.6 kg  10/16/19 104.1 kg  09/06/19 96.2 kg     Intake/Output Summary (Last 24 hours) at 03/06/2020 1247 Last data filed at 03/05/2020 1454 Gross per 24 hour  Intake --  Output 450 ml  Net -450 ml  Physical Exam  Awake Alert, Oriented X 3, No new F.N deficits, Normal affect Symmetrical Chest wall movement, Good air movement bilaterally, CTAB RRR,No Gallops,Rubs or new Murmurs, No Parasternal Heave +ve B.Sounds, Abd Soft, No tenderness, No rebound - guarding or rigidity. No Cyanosis, Clubbing, trace edema, Small rash at left lower extremity and tick bite site     Data Review:    CBC Recent Labs  Lab 03/03/20 1146 03/04/20 0709 03/05/20 0330 03/06/20 0243  WBC 9.4 7.6 7.9 9.6  HGB 14.0 13.7 14.2 14.7  HCT 43.8 41.8 44.5 44.2  PLT 299 284 344 418*  MCV 91.8 89.1 91.9  89.3  MCH 29.4 29.2 29.3 29.7  MCHC 32.0 32.8 31.9 33.3  RDW 14.2 14.3 14.1 14.0  LYMPHSABS  --  1.2  --   --   MONOABS  --  0.7  --   --   EOSABS  --  0.1  --   --   BASOSABS  --  0.0  --   --     Chemistries  Recent Labs  Lab 03/03/20 1146 03/03/20 1656 03/04/20 0709 03/05/20 0330 03/06/20 0243  NA 140  --  137 138 136  K 4.3  --  3.7 4.0 3.9  CL 107  --  100 102 101  CO2 22  --  22 21* 23  GLUCOSE 109*  --  114* 138* 128*  BUN 18  --  18 18 22   CREATININE 1.02  --  1.01 1.00 0.96  CALCIUM 8.9  --  8.5* 8.8* 8.7*  MG  --  2.1 2.0  --   --   AST  --   --  20 20 18   ALT  --   --  20 20 18   ALKPHOS  --   --  45 45 47  BILITOT  --   --  0.7 0.6 0.6   ------------------------------------------------------------------------------------------------------------------ Recent Labs    03/03/20 2306 03/04/20 0249  CHOL  --  156  HDL  --  27*  LDLCALC  --  102*  TRIG 163* 136  CHOLHDL  --  5.8    Lab Results  Component Value Date   HGBA1C 6.1 (H) 03/03/2020   ------------------------------------------------------------------------------------------------------------------ Recent Labs    03/04/20 0709  TSH 2.093   ------------------------------------------------------------------------------------------------------------------ Recent Labs    03/05/20 0330 03/06/20 0243  FERRITIN 333 323    Coagulation profile Recent Labs  Lab 03/03/20 1257 03/04/20 0249 03/04/20 0709 03/05/20 1157 03/06/20 0243  INR 4.5* 4.8* 4.6* 3.8* 3.2*    Recent Labs    03/05/20 0330 03/06/20 0243  DDIMER 5.63* 4.78*    Cardiac Enzymes No results for input(s): CKMB, TROPONINI, MYOGLOBIN in the last 168 hours.  Invalid input(s): CK ------------------------------------------------------------------------------------------------------------------    Component Value Date/Time   BNP 214.6 (H) 03/03/2020 1257    Inpatient Medications  Scheduled Meds: . amiodarone  200 mg  Oral Daily  . dexamethasone (DECADRON) injection  6 mg Intravenous Q24H  . furosemide  40 mg Oral Daily  . metoprolol tartrate  25 mg Oral BID  . sacubitril-valsartan  1 tablet Oral BID  . warfarin  2.5 mg Oral ONCE-1600  . Warfarin - Pharmacist Dosing Inpatient   Does not apply q1600   Continuous Infusions: . remdesivir 100 mg in NS 100 mL 100 mg (03/06/20 0845)   PRN Meds:.acetaminophen, alum & mag hydroxide-simeth  Micro Results Recent Results (from the past 240 hour(s))  SARS CORONAVIRUS 2 (TAT 6-24 HRS) Nasopharyngeal  Nasopharyngeal Swab     Status: Abnormal   Collection Time: 03/03/20  1:58 PM   Specimen: Nasopharyngeal Swab  Result Value Ref Range Status   SARS Coronavirus 2 POSITIVE (A) NEGATIVE Final    Comment: RESULT CALLED TO, READ BACK BY AND VERIFIED WITH: L.VENEGAS RN 1910 03/03/20 MCCORMICK K (NOTE) SARS-CoV-2 target nucleic acids are DETECTED. The SARS-CoV-2 RNA is generally detectable in upper and lower respiratory specimens during the acute phase of infection. Positive results are indicative of the presence of SARS-CoV-2 RNA. Clinical correlation with patient history and other diagnostic information is  necessary to determine patient infection status. Positive results do not rule out bacterial infection or co-infection with other viruses.  The expected result is Negative. Fact Sheet for Patients: HairSlick.no Fact Sheet for Healthcare Providers: quierodirigir.com This test is not yet approved or cleared by the [place] FDA and  has been authorized for detection and/or diagnosis of SARS-CoV-2 by FDA under an Emergency Use Authorization (EUA). This EUA will remain  in effect (meaning this test can be used) for  the duration of the COVID-19 declaration under Section 564(b)(1) of the Act, 21 U.S.C. section 360bbb-3(b)(1), unless the authorization is terminated or revoked sooner. Performed at [facility], [address] [place], [place] [address]     Radiology Reports DG Chest 2 View  Result Date: 03/03/2020 CLINICAL DATA:  Shortness of breath. Left-sided chest pain. EXAM: CHEST - 2 VIEW COMPARISON:  09/06/2013 FINDINGS: Bilateral lower lobe and patchy right upper lobe airspace disease. Possible small right pleural effusion. No left pleural effusion. No pneumothorax. Stable cardiomediastinal silhouette. No aggressive osseous crash that dual lead cardiac pacemaker. No aggressive osseous lesion. IMPRESSION: Bilateral lower lobe and right upper lobe airspace disease concerning for multilobar pneumonia. Followup PA and lateral chest X-ray is recommended in 3-4 weeks following trial of antibiotic therapy to ensure resolution and exclude underlying malignancy. Electronically Signed   By: Elige Ko   On: 03/03/2020 12:09   CT HEAD WO CONTRAST  Result Date: 03/03/2020 CLINICAL DATA:  Atrial fibrillation, altered mental status EXAM: CT HEAD WITHOUT CONTRAST TECHNIQUE: Contiguous axial images were obtained from the base of the skull through the vertex without intravenous contrast. COMPARISON:  None. FINDINGS: Brain: There is atrophy and chronic small vessel disease changes. No acute intracranial abnormality. Specifically, no hemorrhage, hydrocephalus, mass lesion, acute infarction, or significant intracranial injury. Vascular: No hyperdense vessel or unexpected calcification. Skull: No acute calvarial abnormality. Sinuses/Orbits: Mucosal thickening.  No acute findings. Other: None IMPRESSION: Atrophy, chronic microvascular disease. No acute intracranial abnormality. Electronically Signed   By: Charlett Nose M.D.   On: 03/03/2020 18:45   ECHOCARDIOGRAM LIMITED  Result Date: 03/04/2020    ECHOCARDIOGRAM LIMITED REPORT   Patient Name:   [name] Date of Exam: 03/04/2020 Medical Rec #:  [id number]       Height:       70.0 in Accession #:    [id number]      Weight:       214.5 lb Date of Birth:   09-Aug-1941       BSA:          2.150 m Patient Age:    78 years        BP:           128/98 mmHg Patient Gender: M               HR:  109 bpm. Exam Location:  Inpatient Procedure: Limited Color Doppler, Cardiac Doppler and Limited Echo Indications:    chest pain 786.50  History:        Patient has prior history of Echocardiogram examinations, most                 recent 06/16/2016. Pacemaker, Covid; Arrythmias:Atrial                 Fibrillation.  Sonographer:    Delcie Roch Referring Phys: [id number] ARSHAD N KAKRAKANDY IMPRESSIONS  1. Left ventricular ejection fraction, by estimation, is 25 to 30%. The left ventricle has severely decreased function. The left ventricle demonstrates global hypokinesis. Left ventricular diastolic function could not be evaluated.  2. Right ventricular systolic function was not well visualized. The right ventricular size is normal. There is normal pulmonary artery systolic pressure. The estimated right ventricular systolic pressure is 21.3 mmHg.  3. Left atrial size was mildly dilated.  4. The mitral valve is normal in structure. Trivial mitral valve regurgitation.  5. The aortic valve is normal in structure. Aortic valve regurgitation is not visualized.  6. The inferior vena cava is normal in size with greater than 50% respiratory variability, suggesting right atrial pressure of 3 mmHg. Comparison(s): Prior images reviewed side by side. Changes from prior study are noted. The left ventricular function is worsened. FINDINGS  Left Ventricle: Left ventricular ejection fraction, by estimation, is 25 to 30%. The left ventricle has severely decreased function. The left ventricle demonstrates global hypokinesis. Abnormal (paradoxical) septal motion, consistent with left bundle branch block. Left ventricular diastolic function could not be evaluated due to atrial fibrillation. Right Ventricle: The right ventricular size is normal. Right vetricular wall thickness was not assessed.  Right ventricular systolic function was not well visualized. There is normal pulmonary artery systolic pressure. The tricuspid regurgitant velocity is 2.14 m/s, and with an assumed right atrial pressure of 3 mmHg, the estimated right ventricular systolic pressure is 21.3 mmHg. Left Atrium: Left atrial size was mildly dilated. Right Atrium: Right atrial size was not well visualized. Pericardium: There is no evidence of pericardial effusion. Mitral Valve: The mitral valve is normal in structure. Trivial mitral valve regurgitation. Tricuspid Valve: The tricuspid valve is normal in structure. Tricuspid valve regurgitation is trivial. Aortic Valve: The aortic valve is normal in structure. Aortic valve regurgitation is not visualized. Pulmonic Valve: The pulmonic valve was not well visualized. Pulmonic valve regurgitation is trivial. Venous: The inferior vena cava is normal in size with greater than 50% respiratory variability, suggesting right atrial pressure of 3 mmHg. IAS/Shunts: The interatrial septum was not well visualized.  LEFT VENTRICLE PLAX 2D LVIDd:         3.70 cm LVIDs:         3.30 cm LV PW:         1.00 cm LV IVS:        1.10 cm LVOT diam:     1.90 cm LVOT Area:     2.84 cm  LEFT ATRIUM         Index LA diam:    3.70 cm 1.72 cm/m  TRICUSPID VALVE TR Peak grad:   18.3 mmHg TR Vmax:        214.00 cm/s  SHUNTS Systemic Diam: 1.90 cm Thurmon Fair MD Electronically signed by Thurmon Fair MD Signature Date/Time: 03/04/2020/1:46:24 PM    Final       Huey Bienenstock M.D on 03/06/2020 at 12:47 PM  Between 7am to  7pm - Pager - [phone]  After 7pm go to [email or web address] - password [facility]  [facility] -  Office  [phone]

## 2020-03-06 NOTE — Care Management (Addendum)
CM spoke with pt via phone.   Pt informed CM that he is independent from home - pt would not elaborate if he lives alone or with family/friends.  CM discussed the recommendation of therapy for pt to have someone with him 24/7 at discharge - pt informed CM that he would (again pt would not elaborate).  CM also discussed with pt the recommendation of HH - pt politely declined and stated "I'm a private person".  Pt confirmed with CM that he has a PCP and denied hardship with medication copays.  TOC will continue to follow   Entresto benefit check submitted

## 2020-03-06 NOTE — Progress Notes (Signed)
Subjective:  Sats now ok on RA Ambulating with PT/OT  Objective:  Vitals:   03/05/20 2000 03/06/20 0000 03/06/20 0400 03/06/20 0500  BP: 115/81 96/71 105/70   Pulse: 71 60 60   Resp: (!) 23 15 17    Temp:   (!) 97.5 F (36.4 C)   TempSrc:   Oral   SpO2: 90% (!) 87% 97%   Weight:    98.6 kg  Height:        Intake/Output from previous day:  Intake/Output Summary (Last 24 hours) at 03/06/2020 0751 Last data filed at 03/05/2020 1454 Gross per 24 hour  Intake --  Output 450 ml  Net -450 ml    Physical Exam: Affect appropriate Chronically ill  HEENT: normal Neck supple with no adenopathy JVP normal no bruits no thyromegaly Lungs basilar crackles  Heart:  S1/S2 no murmur, no rub, gallop or click PMI normal pacer under left clavicle  Abdomen: benighn, BS positve, no tenderness, no AAA no bruit.  No HSM or HJR Distal pulses intact with no bruits Plus 2 LE edema  Neuro non-focal Skin warm and dry No muscular weakness   Lab Results: Basic Metabolic Panel: Recent Labs    03/03/20 1146 03/03/20 1656 03/04/20 0709 03/04/20 0709 03/05/20 0330 03/06/20 0243  NA   < >  --  137   < > 138 136  K   < >  --  3.7   < > 4.0 3.9  CL   < >  --  100   < > 102 101  CO2   < >  --  22   < > 21* 23  GLUCOSE   < >  --  114*   < > 138* 128*  BUN   < >  --  18   < > 18 22  CREATININE   < >  --  1.01   < > 1.00 0.96  CALCIUM   < >  --  8.5*   < > 8.8* 8.7*  MG  --  2.1 2.0  --   --   --    < > = values in this interval not displayed.   Liver Function Tests: Recent Labs    03/05/20 0330 03/06/20 0243  AST 20 18  ALT 20 18  ALKPHOS 45 47  BILITOT 0.6 0.6  PROT 6.7 6.4*  ALBUMIN 2.6* 2.7*   No results for input(s): LIPASE, AMYLASE in the last 72 hours. CBC: Recent Labs    03/04/20 0709 03/04/20 0709 03/05/20 0330 03/06/20 0243  WBC 7.6   < > 7.9 9.6  NEUTROABS 5.6  --   --   --   HGB 13.7   < > 14.2 14.7  HCT 41.8   < > 44.5 44.2  MCV 89.1   < > 91.9 89.3  PLT  284   < > 344 418*   < > = values in this interval not displayed.   Cardiac Enzymes: No results for input(s): CKTOTAL, CKMB, CKMBINDEX, TROPONINI in the last 72 hours. BNP: Invalid input(s): POCBNP D-Dimer: Recent Labs    03/05/20 0330 03/06/20 0243  DDIMER 5.63* 4.78*   Hemoglobin A1C: Recent Labs    03/03/20 1656  HGBA1C 6.1*   Fasting Lipid Panel: Recent Labs    03/04/20 0249  CHOL 156  HDL 27*  LDLCALC 102*  TRIG 136  CHOLHDL 5.8   Thyroid Function Tests: Recent Labs    03/04/20 0709  TSH  2.093   Anemia Panel: Recent Labs    03/06/20 0243  FERRITIN 323    Imaging: ECHOCARDIOGRAM LIMITED  Result Date: 03/04/2020    ECHOCARDIOGRAM LIMITED REPORT   Patient Name:   [name] Date of Exam: 03/04/2020 Medical Rec #:  [id number]       Height:       70.0 in Accession #:    [id number]      Weight:       214.5 lb Date of Birth:  Feb 22, 1941       BSA:          2.150 m Patient Age:    79 years        BP:           128/98 mmHg Patient Gender: M               HR:           109 bpm. Exam Location:  Inpatient Procedure: Limited Color Doppler, Cardiac Doppler and Limited Echo Indications:    chest pain 786.50  History:        Patient has prior history of Echocardiogram examinations, most                 recent 06/16/2016. Pacemaker, Covid; Arrythmias:Atrial                 Fibrillation.  Sonographer:    Delcie Roch Referring Phys: [id number] ARSHAD N KAKRAKANDY IMPRESSIONS  1. Left ventricular ejection fraction, by estimation, is 25 to 30%. The left ventricle has severely decreased function. The left ventricle demonstrates global hypokinesis. Left ventricular diastolic function could not be evaluated.  2. Right ventricular systolic function was not well visualized. The right ventricular size is normal. There is normal pulmonary artery systolic pressure. The estimated right ventricular systolic pressure is 21.3 mmHg.  3. Left atrial size was mildly dilated.  4. The mitral valve is  normal in structure. Trivial mitral valve regurgitation.  5. The aortic valve is normal in structure. Aortic valve regurgitation is not visualized.  6. The inferior vena cava is normal in size with greater than 50% respiratory variability, suggesting right atrial pressure of 3 mmHg. Comparison(s): Prior images reviewed side by side. Changes from prior study are noted. The left ventricular function is worsened. FINDINGS  Left Ventricle: Left ventricular ejection fraction, by estimation, is 25 to 30%. The left ventricle has severely decreased function. The left ventricle demonstrates global hypokinesis. Abnormal (paradoxical) septal motion, consistent with left bundle branch block. Left ventricular diastolic function could not be evaluated due to atrial fibrillation. Right Ventricle: The right ventricular size is normal. Right vetricular wall thickness was not assessed. Right ventricular systolic function was not well visualized. There is normal pulmonary artery systolic pressure. The tricuspid regurgitant velocity is 2.14 m/s, and with an assumed right atrial pressure of 3 mmHg, the estimated right ventricular systolic pressure is 21.3 mmHg. Left Atrium: Left atrial size was mildly dilated. Right Atrium: Right atrial size was not well visualized. Pericardium: There is no evidence of pericardial effusion. Mitral Valve: The mitral valve is normal in structure. Trivial mitral valve regurgitation. Tricuspid Valve: The tricuspid valve is normal in structure. Tricuspid valve regurgitation is trivial. Aortic Valve: The aortic valve is normal in structure. Aortic valve regurgitation is not visualized. Pulmonic Valve: The pulmonic valve was not well visualized. Pulmonic valve regurgitation is trivial. Venous: The inferior vena cava is normal in size with greater than 50% respiratory variability, suggesting right  atrial pressure of 3 mmHg. IAS/Shunts: The interatrial septum was not well visualized.  LEFT VENTRICLE PLAX 2D  LVIDd:         3.70 cm LVIDs:         3.30 cm LV PW:         1.00 cm LV IVS:        1.10 cm LVOT diam:     1.90 cm LVOT Area:     2.84 cm  LEFT ATRIUM         Index LA diam:    3.70 cm 1.72 cm/m  TRICUSPID VALVE TR Peak grad:   18.3 mmHg TR Vmax:        214.00 cm/s  SHUNTS Systemic Diam: 1.90 cm Rachelle Hora Croitoru MD Electronically signed by Thurmon Fair MD Signature Date/Time: 03/04/2020/1:46:24 PM    Final     Cardiac Studies:  ECG: SR LBBB   Telemetry: afib rates 100-120 bpm   Echo: pending   Medications:   . amiodarone  200 mg Oral Daily  . dexamethasone (DECADRON) injection  6 mg Intravenous Q24H  . furosemide  40 mg Oral Daily  . metoprolol tartrate  25 mg Oral BID  . sacubitril-valsartan  1 tablet Oral BID  . Warfarin - Pharmacist Dosing Inpatient   Does not apply q1600     . remdesivir 100 mg in NS 100 mL 100 mg (03/05/20 0933)    Assessment/Plan:   1. CHF:  Mild with minimally elevated BNP EF 25-30% by echo will need further W/u as outpatient Entresto started 03/05/20 oral lasix now  2. PAF: _> converted and AV pacing   On home oral dose of amiodarone now will need outpatient PFTls/DLCO after recovering from COVID  3. COVID:  Rx per primary service has had steroids  And remdesivir   Will arrange outpatient f/u with Dr Graciela Husbands and Diona Browner Will sign off   Charlton Haws 03/06/2020, 7:51 AM

## 2020-03-06 NOTE — Progress Notes (Signed)
Occupational Therapy Treatment Patient Details Name: [name] MRN: [id number] DOB: July 22, 1941 Today's Date: 03/06/2020    History of present illness 79 year old male admitted 03/05/20 when his pacemaker company called to notify him his HR was increased and to go to the ER. Patient reports recent ER visit for diarrhea and was given fluids, since then has had LE edema and SOB. Patient presented this admission with increased HR, volume overload, LE edema. CXR showed PNA. Patient +COVID. IV Lasix started for CHF. Patient with acute respiratory failure with hypoxia secondary to COVID and CHFl. Cardiology following. COIVD PNA likely triggered Afib with RVR and lead to decompensated heart failure. PMH: Afib, tachybradycardia syndrome s/p pacemaker placement, cardiomyopathy   OT comments  Pt progressing with OT goals and eager to participate with therapy. Pt Supervision for bed mobility to sit EOB (encouraged pt to avoid using bed rails to simulate home environment, but pt opted to use bed rails). Pt Supervision for sit to stand, stand pivot, and mobility in room with RW. Minor cues needed throughout for RW use and mgmt. Pt Supervision for toilet transfer - did require some education on getting up from low toilet as pt had difficulty. Pt reports a sink and tub next to toilet that he can use to push self up from toilet. Pt Supervision for toileting task and hand hygiene while standing at sink. VSS on RA throughout. No OT follow up needed, but will continue to follow acutely to maximize safety/independence.   Follow Up Recommendations  No OT follow up;Supervision - Intermittent    Equipment Recommendations  None recommended by OT    Recommendations for Other Services      Precautions / Restrictions Precautions Precautions: Fall;Other (comment) Precaution Comments: +COVID, fluid restriction Required Braces or Orthoses: Other Brace Other Brace: patient wears his personal neoprene type knee sleeves  bilat Restrictions Weight Bearing Restrictions: No       Mobility Bed Mobility Overal bed mobility: Needs Assistance Bed Mobility: Supine to Sit     Supine to sit: Supervision     General bed mobility comments: Attempted to guide pt in bed mobility to sit EOB with flat bed and without use of rails...however, pt opted to still use bedrails to sit EOB  Transfers Overall transfer level: Needs assistance Equipment used: Rolling walker (2 wheeled) Transfers: Sit to/from [employer] Sit to Stand: Supervision Stand pivot transfers: Supervision       General transfer comment: Cues for hand placement     Balance Overall balance assessment: Needs assistance Sitting-balance support: Feet supported Sitting balance-Leahy Scale: Good     Standing balance support: Bilateral upper extremity supported;No upper extremity supported Standing balance-Leahy Scale: Fair                             ADL either performed or assessed with clinical judgement   ADL Overall ADL's : Needs assistance/impaired     Grooming: Supervision/safety;Standing;Wash/dry Electrical engineer Transfer: Supervision/safety;Ambulation;Regular Toilet;RW Toilet Transfer Details (indicate cue type and reason): improved steadiness, minor cues for use of RW, and safest way to transfer off of low toilet  Toileting- Clothing Manipulation and Hygiene: Independent;Sitting/lateral lean;Sit to/from stand Toileting - Clothing Manipulation Details (indicate cue type and reason): Independent for posterior hygiene. Pt preferred to be very thorough for task      Functional mobility  during ADLs: Supervision/safety;Rolling walker General ADL Comments: Improving steadiness on feet compared to yesterday       Vision       Perception     Praxis      Cognition Arousal/Alertness: Awake/alert Behavior During Therapy: WFL for tasks assessed/performed Overall Cognitive Status:  No family/caregiver present to determine baseline cognitive functioning                                          Exercises     Shoulder Instructions       General Comments VSS on RA    Pertinent Vitals/ Pain       Pain Assessment: Faces Faces Pain Scale: Hurts a little bit Pain Location: soreness in B feet due to swelling Pain Descriptors / Indicators: Sore Pain Intervention(s): Monitored during session;Repositioned  Home Living                                          Prior Functioning/Environment              Frequency  Min 3X/week        Progress Toward Goals  OT Goals(current goals can now be found in the care plan section)  Progress towards OT goals: Progressing toward goals  Acute Rehab OT Goals Patient Stated Goal: to go home today OT Goal Formulation: With patient Time For Goal Achievement: 03/19/20 Potential to Achieve Goals: Good ADL Goals Pt Will Perform Grooming: Independently;standing Pt Will Perform Lower Body Dressing: with modified independence;sit to/from stand;sitting/lateral leans Pt Will Transfer to Toilet: with modified independence;ambulating;regular height toilet Pt Will Perform Toileting - Clothing Manipulation and hygiene: with modified independence;sit to/from stand;sitting/lateral leans Pt Will Perform Tub/Shower Transfer: with supervision;Stand pivot transfer  Plan Discharge plan remains appropriate    Co-evaluation                 AM-PAC OT "6 Clicks" Daily Activity     Outcome Measure   Help from another person eating meals?: None Help from another person taking care of personal grooming?: A Little Help from another person toileting, which includes using toliet, bedpan, or urinal?: A Little Help from another person bathing (including washing, rinsing, drying)?: A Little Help from another person to put on and taking off regular upper body clothing?: None Help from another person to  put on and taking off regular lower body clothing?: A Little 6 Click Score: 20    End of Session Equipment Utilized During Treatment: Gait belt;Rolling walker  OT Visit Diagnosis: Unsteadiness on feet (R26.81);History of falling (Z91.81)   Activity Tolerance Patient tolerated treatment well   Patient Left in chair;with call bell/phone within reach;with chair alarm set   Nurse Communication Mobility status        Time: 3790-2409 OT Time Calculation (min): 47 min  Charges: OT General Charges $OT Visit: 1 Visit OT Treatments $Self Care/Home Management : 23-37 mins $Therapeutic Activity: 8-22 mins  Layla Maw, OTR/L   Layla Maw 03/06/2020, 2:56 PM

## 2020-03-06 NOTE — Progress Notes (Signed)
ANTICOAGULATION CONSULT NOTE Pharmacy Consult for Warfarin Indication: atrial fibrillation  Allergies  Allergen Reactions  . Bee Venom Shortness Of Breath    BEE STINGS - dizziness   . Corn-Containing Products Anaphylaxis    Corn bread, pop corn----tylenol is OK  . Codeine     REACTION: muscle cramps  . Invert Sugar   . Vanilla Diarrhea    Patient Measurements: Height: 5\' 10"  (177.8 cm) Weight: 98.6 kg (217 lb 6 oz) IBW/kg (Calculated) : 73   Vital Signs: Temp: 97.6 F (36.4 C) (04/08 0814) Temp Source: Oral (04/08 0814) BP: 106/75 (04/08 0814) Pulse Rate: 67 (04/08 0814)  Labs: Recent Labs    03/03/20 1146 03/03/20 1235 03/03/20 1257 03/04/20 0709 03/04/20 0709 03/05/20 0330 03/05/20 1157 03/06/20 0243  HGB 14.0  --    < > 13.7   < > 14.2  --  14.7  HCT 43.8  --    < > 41.8  --  44.5  --  44.2  PLT 299  --    < > 284  --  344  --  418*  LABPROT  --   --    < > 43.4*  --   --  37.6* 33.0*  INR  --   --    < > 4.6*  --   --  3.8* 3.2*  CREATININE 1.02  --    < > 1.01  --  1.00  --  0.96  TROPONINIHS 52* 54*  --  52*  --   --   --   --    < > = values in this interval not displayed.    Estimated Creatinine Clearance: 77 mL/min (by C-G formula based on SCr of 0.96 mg/dL).  Assessment: Patient is a 36 yom that presents to the ED in Afib with RVR. The patient was recently treated at an outside hospital for diarrhea and dehydration. The patient is on warfarin at home and pharmacy has been asked to manage the patient's warfarin while inpatient.   PTA warfarin regimen: 3.75mg  Q Sun, Thu and 2.5mg  ROW   INR trending down - > 3.2 so we will resume dosing coumadin today. H/H stable   Goal of Therapy:  INR 2-3 Monitor platelets by anticoagulation protocol: Yes   Plan:  - Coumadin 2.5mg  PO x1 - Monitor patient for s/s of bleeding - Daily PT-INR and monitor CBC   Sat, PharmD, BCIDP, AAHIVP, CPP Infectious Disease Pharmacist 03/06/2020 8:57 AM

## 2020-03-06 NOTE — Progress Notes (Signed)
Physical Therapy Treatment Note  Patient with improved mobility and balance. Continued recommendation for use of RW for all mobility. Patient declined practicing stair negotiation as he has 3 STE with bilat rails to enter his home. Patient reports his friend can assist him if needed with the stairs. Continued recommendation for home PT services and 24/7 supervision/assist.    03/06/20 1504  PT Visit Information  Last PT Received On 03/06/20  Assistance Needed +1  History of Present Illness 79 year old male admitted 03/05/20 when his pacemaker company called to notify him his HR was increased and to go to the ER. Patient reports recent ER visit for diarrhea and was given fluids, since then has had LE edema and SOB. Patient presented this admission with increased HR, volume overload, LE edema. CXR showed PNA. Patient +COVID. IV Lasix started for CHF. Patient with acute respiratory failure with hypoxia secondary to COVID and CHFl. Cardiology following. COIVD PNA likely triggered Afib with RVR and lead to decompensated heart failure. PMH: Afib, tachybradycardia syndrome s/p pacemaker placement, cardiomyopathy  Subjective Data  Subjective Patient agreeable to PT session.  Precautions  Precautions Fall;Other (comment)  Precaution Comments +COVID, fluid restriction  Other Brace patient wears his personal neoprene type knee sleeves bilat  Restrictions  Weight Bearing Restrictions No  Pain Assessment  Pain Assessment No/denies pain (No complaints of pain, no signs/symptoms of pain)  Cognition  Arousal/Alertness Awake/alert  Overall Cognitive Status No family/caregiver present to determine baseline cognitive functioning  General Comments Patient reports he has short term memory problems at baseline  Bed Mobility  Bed Mobility Sit to Supine  Sit to supine Modified independent (Device/Increase time)  General bed mobility comments Patient received on toilet at start of session. Patient  requesting back to bed at end of session.  Transfers  Overall transfer level Needs assistance  Equipment used Rolling walker (2 wheeled)  Transfers Sit to/from Stand ((sit>stand from toilet))  Sit to Stand Supervision  General transfer comment sit>stand from toilet with cues for technique and hand placement with GB on L. Patient reports he has a sink/counter near his toilet he can use to assist with transfer. Stand>sit on EOB.  Ambulation/Gait  Ambulation/Gait assistance Supervision;Modified independent (Device/Increase time)  Gait Distance (Feet) 12 Feet  Assistive device Rolling walker (2 wheeled)  Gait Pattern/deviations Decreased step length - right;Decreased step length - left;Step-through pattern  General Gait Details Patient declines further ambulation trials. Oxygen saturation 90% on room air after ambulating from restroom to bed.   Gait velocity decreased  Stairs  (Patient declined practicing stair negotiation. )  Balance  Overall balance assessment Needs assistance  Sitting-balance support Feet supported  Sitting balance-Leahy Scale Good  Sitting balance - Comments Patient able to complete hygiene after toileting seated modI.  Standing balance support No upper extremity supported;Bilateral upper extremity supported;Single extremity supported  Standing balance-Leahy Scale Fair  Standing balance comment Patient able to stand at sink for hand hygiene with minimal weight shift without UE support.  General Comments  General comments (skin integrity, edema, etc.) Patient on room air.   PT - End of Session  Activity Tolerance Patient limited by fatigue  Patient left in bed;with call bell/phone within reach;with bed alarm set  Nurse Communication Other (comment) (Nurse cleared patient to participate in PT session)   PT - Assessment/Plan  PT Plan Current plan remains appropriate  PT Visit Diagnosis Unsteadiness on feet (R26.81);Other abnormalities of gait and mobility  (R26.89);Repeated falls (R29.6)  PT Frequency (ACUTE  ONLY) Min 3X/week  Follow Up Recommendations Home health PT;Supervision/Assistance - 24 hour  PT equipment Rolling walker with 5" wheels ((patient reports he owns RW))  AM-PAC PT "6 Clicks" Mobility Outcome Measure (Version 2)  Help needed turning from your back to your side while in a flat bed without using bedrails? 4  Help needed moving from lying on your back to sitting on the side of a flat bed without using bedrails? 4  Help needed moving to and from a bed to a chair (including a wheelchair)? 3  Help needed standing up from a chair using your arms (e.g., wheelchair or bedside chair)? 3  Help needed to walk in hospital room? 3  Help needed climbing 3-5 steps with a railing?  3  6 Click Score 20  Consider Recommendation of Discharge To: Home with no services  PT Goal Progression  Progress towards PT goals Progressing toward goals  PT Time Calculation  PT Start Time (ACUTE ONLY) 1504  PT Stop Time (ACUTE ONLY) 1538  PT Time Calculation (min) (ACUTE ONLY) 34 min  PT General Charges  $$ ACUTE PT VISIT 1 Visit  PT Treatments  $Therapeutic Activity 23-37 mins   [name], PT, DPT Acute Rehab [phone] office

## 2020-03-07 LAB — COMPREHENSIVE METABOLIC PANEL
ALT: 17 U/L (ref 0–44)
AST: 16 U/L (ref 15–41)
Albumin: 2.7 g/dL — ABNORMAL LOW (ref 3.5–5.0)
Alkaline Phosphatase: 48 U/L (ref 38–126)
Anion gap: 12 (ref 5–15)
BUN: 25 mg/dL — ABNORMAL HIGH (ref 8–23)
CO2: 22 mmol/L (ref 22–32)
Calcium: 9 mg/dL (ref 8.9–10.3)
Chloride: 103 mmol/L (ref 98–111)
Creatinine, Ser: 0.9 mg/dL (ref 0.61–1.24)
GFR calc Af Amer: >60 mL/min (ref >60)
GFR calc non Af Amer: >60 mL/min (ref >60)
Glucose, Bld: 119 mg/dL — ABNORMAL HIGH (ref 70–99)
Potassium: 4.5 mmol/L (ref 3.5–5.1)
Sodium: 137 mmol/L (ref 135–145)
Total Bilirubin: 0.9 mg/dL (ref 0.3–1.2)
Total Protein: 6.7 g/dL (ref 6.5–8.1)

## 2020-03-07 LAB — CBC
HCT: 45.5 % (ref 39.0–52.0)
Hemoglobin: 15.1 g/dL (ref 13.0–17.0)
MCH: 29.6 pg (ref 26.0–34.0)
MCHC: 33.2 g/dL (ref 30.0–36.0)
MCV: 89.2 fL (ref 80.0–100.0)
Platelets: 439 10*3/uL — ABNORMAL HIGH (ref 150–400)
RBC: 5.1 MIL/uL (ref 4.22–5.81)
RDW: 14 % (ref 11.5–15.5)
WBC: 9.3 10*3/uL (ref 4.0–10.5)
nRBC: 0 % (ref 0.0–0.2)

## 2020-03-07 LAB — PROTIME-INR
INR: 2.5 — ABNORMAL HIGH (ref 0.8–1.2)
Prothrombin Time: 27.1 seconds — ABNORMAL HIGH (ref 11.4–15.2)

## 2020-03-07 LAB — FERRITIN: Ferritin: 312 ng/mL (ref 24–336)

## 2020-03-07 LAB — D-DIMER, QUANTITATIVE: D-Dimer, Quant: 3.76 ug/mL-FEU — ABNORMAL HIGH (ref 0.00–0.50)

## 2020-03-07 LAB — C-REACTIVE PROTEIN: CRP: 1.9 mg/dL — ABNORMAL HIGH (ref <1.0)

## 2020-03-07 MED ORDER — FAMOTIDINE 20 MG PO TABS
20.0000 mg | ORAL_TABLET | Freq: Two times a day (BID) | ORAL | Status: DC
  Administered 2020-03-07 – 2020-03-08 (×3): 20 mg via ORAL
  Filled 2020-03-07 (×3): qty 1

## 2020-03-07 MED ORDER — WARFARIN SODIUM 2.5 MG PO TABS
3.7500 mg | ORAL_TABLET | Freq: Once | ORAL | Status: AC
  Administered 2020-03-07: 3.75 mg via ORAL
  Filled 2020-03-07: qty 1

## 2020-03-07 NOTE — Progress Notes (Signed)
ANTICOAGULATION CONSULT NOTE Pharmacy Consult for Warfarin Indication: atrial fibrillation  Allergies  Allergen Reactions  . Bee Venom Shortness Of Breath    BEE STINGS - dizziness   . Corn-Containing Products Anaphylaxis    Corn bread, pop corn----tylenol is OK  . Codeine     REACTION: muscle cramps  . Invert Sugar   . Vanilla Diarrhea    Patient Measurements: Height: 5\' 10"  (177.8 cm) Weight: 95.5 kg (210 lb 8.6 oz) IBW/kg (Calculated) : 73   Vital Signs: Temp: 98 F (36.7 C) (04/09 0729) Temp Source: Oral (04/09 0729) BP: 109/78 (04/09 0729) Pulse Rate: 65 (04/09 0729)  Labs: Recent Labs    03/05/20 0330 03/05/20 0330 03/05/20 1157 03/06/20 0243 03/07/20 0256  HGB 14.2   < >  --  14.7 15.1  HCT 44.5  --   --  44.2 45.5  PLT 344  --   --  418* 439*  LABPROT  --   --  37.6* 33.0* 27.1*  INR  --   --  3.8* 3.2* 2.5*  CREATININE 1.00  --   --  0.96 0.90   < > = values in this interval not displayed.    Estimated Creatinine Clearance: 81 mL/min (by C-G formula based on SCr of 0.9 mg/dL).  Assessment: Patient is a 81 yom that presents to the ED in Afib with RVR. The patient was recently treated at an outside hospital for diarrhea and dehydration. The patient is on warfarin at home and pharmacy has been asked to manage the patient's warfarin while inpatient.   PTA warfarin regimen: 3.75mg  Q Sun, Thu and 2.5mg  ROW   INR 2.5 today  Goal of Therapy:  INR 2-3 Monitor platelets by anticoagulation protocol: Yes   Plan:  - Coumadin 3.75mg  PO x1 - Monitor patient for s/s of bleeding - Daily PT-INR and monitor CBC   Thank you Thu, PharmD [phone] 03/07/2020 9:12 AM

## 2020-03-07 NOTE — Progress Notes (Addendum)
SATURATION QUALIFICATIONS: (This note is used to comply with regulatory documentation for home oxygen)  Patient Saturations on Room Air at Rest = 95%  Patient Saturations on Room Air while Ambulating = 92- 93% during ambulation and holding a full conversation during the walk test.   Patient Saturations on 2 Liters of oxygen while Ambulating = 100%

## 2020-03-07 NOTE — TOC Benefit Eligibility Note (Signed)
Transition of Care [facility]) Benefit Eligibility Note    Patient Details  Name: NICKALOUS STINGLEY MRN: [id number] Date of Birth: 05/08/1941   Medication/Dose: Sherryll Burger 24-26  Covered?: Yes  Prescription Coverage Preferred Pharmacy: [facility] or [facility]  Spoke with Person/Company/Phone Number:: [employer]  Co-Pay: $47 for 30 day retail/ $141 for 90 day mail order  Prior Approval: No    Orson Aloe Phone Number: 03/07/2020, 9:16 AM

## 2020-03-07 NOTE — Progress Notes (Signed)
PROGRESS NOTE                                                                                                                                                                                                             Patient Demographics:    [name], is a 79 y.o. male, DOB - 09/05/1941, GNF:[id number]  Admit date - 03/03/2020   Admitting Physician Eduard Clos, MD  Outpatient Primary MD for the patient is Suzan Slick, MD  LOS - 4   Chief Complaint  Patient presents with  . Chest Pain  . Atrial Fibrillation       Brief Narrative    79 y.o. male with history of A. fib, tachybradycardia syndrome status post pacemaker placement, cardiomyopathy was notified by the pacemaker company that his heart rate was increased.  Presents to ED, noted to be dyspneic with increased work of breathing, with some edema, work-up significant for acute CHF, can [place] to A. fib with RVR, requiring amiodarone drip initially, as well work-up significant for COVID-19 pneumonia.    Subjective:    [name] today has, No headache, No chest pain, No abdominal pain , reports his dyspnea has  subsided.    Assessment  & Plan :    Principal Problem:   Acute respiratory failure due to COVID-19 [facility]) Active Problems:   PAF (paroxysmal atrial fibrillation) (HCC)   Pacemaker-[employer]   Atrial fibrillation with RVR (HCC)   Acute encephalopathy   Acute respiratory failure with hypoxia (HCC)  Acute respiratory failure with hypoxia secondary to Covid infection and CHF exacerbation: -Patient with no further oxygen requirement, tolerating room air. -Continue with IV Decadron. -Continue with IV remdesivir day 4?5. -He was encouraged to use  incentive spirometry, flutter valve, and to get out of bed to chair . -Continue to trend inflammatory markers . COVID-19 Labs  Recent Labs    03/05/20 0330 03/06/20 0243 03/07/20 0256  DDIMER 5.63* 4.78* 3.76*    FERRITIN 333 323 312  CRP 8.6* 4.3* 1.9*    Lab Results  Component Value Date   SARSCOV2NAA POSITIVE (A) 03/03/2020     Acute on chronic systolic CHF . - History of cardiomyopathy being seen by cardiology see #1.  Last EF was 40 to 45% in 2017. -[name] 2D echo showing significantly reduced EF 25 to 30%, treatment  per cardiology, need work-up as an outpatient. -Started on low-dose Entresto.continue with metoprolol. -Treatment per cardiology. -He did require IV Lasix initially, currently transitioned to p.o. Lasix, will monitor closely as he is still having some lower extremity edema, may need an extra Lasix intermittently.  Paroxysmal atrial fibrillation -Requiring amiodarone drip initially, currently transitioned to oral amiodarone - continue with metoprolol. -On warfarin for anticoagulation.  Tick bite -Patient noted to have small rash on left lower extremity, reports he noted a tick bite 5 days ago when he removed the tick, started on doxycycline 100 mg p.o. twice daily for prophylaxis.  History of tachybradycardia  -Status post pacemaker placement  COVID-19 Labs  Recent Labs    03/05/20 0330 03/06/20 0243 03/07/20 0256  DDIMER 5.63* 4.78* 3.76*  FERRITIN 333 323 312  CRP 8.6* 4.3* 1.9*    Lab Results  Component Value Date   SARSCOV2NAA POSITIVE (A) 03/03/2020     Code Status : Full  Family Communication  : Patient reported he has no close family members to update.  To call both phone numbers to get me today Lyla Son [phone], and Brett Canales [phone], but no one has answered  Disposition Plan  : Home  Barriers For Discharge : She remains on IV steroids, IV remdesivir.  Consults  : Cardiology  Procedures  : None  DVT Prophylaxis  : On warfarin  Lab Results  Component Value Date   PLT 439 (H) 03/07/2020    Antibiotics  :    Anti-infectives (From admission, onward)   Start     Dose/Rate Route Frequency Ordered Stop   03/06/20 1300  doxycycline  (VIBRA-TABS) tablet 100 mg     100 mg Oral Every 12 hours 03/06/20 1249     03/05/20 1000  remdesivir 100 mg in sodium chloride 0.9 % 100 mL IVPB  Status:  Discontinued     100 mg 200 mL/hr over 30 Minutes Intravenous Daily 03/04/20 0205 03/04/20 0208   03/05/20 1000  remdesivir 100 mg in sodium chloride 0.9 % 100 mL IVPB     100 mg 200 mL/hr over 30 Minutes Intravenous Daily 03/04/20 0210 03/09/20 0959   03/04/20 0800  azithromycin (ZITHROMAX) 500 mg in sodium chloride 0.9 % 250 mL IVPB  Status:  Discontinued     500 mg 250 mL/hr over 60 Minutes Intravenous Every 24 hours 03/04/20 0557 03/04/20 1054   03/04/20 0600  cefTRIAXone (ROCEPHIN) 2 g in sodium chloride 0.9 % 100 mL IVPB  Status:  Discontinued     2 g 200 mL/hr over 30 Minutes Intravenous Every 24 hours 03/04/20 0557 03/04/20 1054   03/04/20 0230  remdesivir 100 mg in sodium chloride 0.9 % 100 mL IVPB     100 mg 200 mL/hr over 30 Minutes Intravenous Every 30 min 03/04/20 0210 03/04/20 0749   03/04/20 0215  remdesivir 200 mg in sodium chloride 0.9% 250 mL IVPB  Status:  Discontinued     200 mg 580 mL/hr over 30 Minutes Intravenous Once 03/04/20 0205 03/04/20 0208        Objective:   Vitals:   03/07/20 0300 03/07/20 0500 03/07/20 0729 03/07/20 1155  BP: 108/64  109/78 116/73  Pulse: 83  65 65  Resp: (!) 22  19 18   Temp: 97.6 F (36.4 C)  98 F (36.7 C) 97.7 F (36.5 C)  TempSrc: Oral  Oral Axillary  SpO2: 92%  92% 93%  Weight:  95.5 kg    Height:  Wt Readings from Last 3 Encounters:  03/07/20 95.5 kg  10/16/19 104.1 kg  09/06/19 96.2 kg     Intake/Output Summary (Last 24 hours) at 03/07/2020 1521 Last data filed at 03/07/2020 1400 Gross per 24 hour  Intake 1020 ml  Output 800 ml  Net 220 ml     Physical Exam  Awake Alert, Oriented X 3, No new F.N deficits, Normal affect Symmetrical Chest wall movement, Good air movement bilaterally, CTAB RRR,No Gallops,Rubs or new Murmurs, No Parasternal Heave +ve  B.Sounds, Abd Soft, No tenderness, No rebound - guarding or rigidity. No Cyanosis, Clubbing or edema, No new Rash or bruise      Data Review:    CBC Recent Labs  Lab 03/03/20 1146 03/04/20 0709 03/05/20 0330 03/06/20 0243 03/07/20 0256  WBC 9.4 7.6 7.9 9.6 9.3  HGB 14.0 13.7 14.2 14.7 15.1  HCT 43.8 41.8 44.5 44.2 45.5  PLT 299 284 344 418* 439*  MCV 91.8 89.1 91.9 89.3 89.2  MCH 29.4 29.2 29.3 29.7 29.6  MCHC 32.0 32.8 31.9 33.3 33.2  RDW 14.2 14.3 14.1 14.0 14.0  LYMPHSABS  --  1.2  --   --   --   MONOABS  --  0.7  --   --   --   EOSABS  --  0.1  --   --   --   BASOSABS  --  0.0  --   --   --     Chemistries  Recent Labs  Lab 03/03/20 1146 03/03/20 1656 03/04/20 0709 03/05/20 0330 03/06/20 0243 03/07/20 0256  NA 140  --  137 138 136 137  K 4.3  --  3.7 4.0 3.9 4.5  CL 107  --  100 102 101 103  CO2 22  --  22 21* 23 22  GLUCOSE 109*  --  114* 138* 128* 119*  BUN 18  --  18 18 22  25*  CREATININE 1.02  --  1.01 1.00 0.96 0.90  CALCIUM 8.9  --  8.5* 8.8* 8.7* 9.0  MG  --  2.1 2.0  --   --   --   AST  --   --  20 20 18 16   ALT  --   --  20 20 18 17   ALKPHOS  --   --  45 45 47 48  BILITOT  --   --  0.7 0.6 0.6 0.9   ------------------------------------------------------------------------------------------------------------------ No results for input(s): CHOL, HDL, LDLCALC, TRIG, CHOLHDL, LDLDIRECT in the last 72 hours.  Lab Results  Component Value Date   HGBA1C 6.1 (H) 03/03/2020   ------------------------------------------------------------------------------------------------------------------ No results for input(s): TSH, T4TOTAL, T3FREE, THYROIDAB in the last 72 hours.  Invalid input(s): FREET3 ------------------------------------------------------------------------------------------------------------------ Recent Labs    03/06/20 0243 03/07/20 0256  FERRITIN 323 312    Coagulation profile Recent Labs  Lab 03/04/20 0249 03/04/20 0709  03/05/20 1157 03/06/20 0243 03/07/20 0256  INR 4.8* 4.6* 3.8* 3.2* 2.5*    Recent Labs    03/06/20 0243 03/07/20 0256  DDIMER 4.78* 3.76*    Cardiac Enzymes No results for input(s): CKMB, TROPONINI, MYOGLOBIN in the last 168 hours.  Invalid input(s): CK ------------------------------------------------------------------------------------------------------------------    Component Value Date/Time   BNP 214.6 (H) 03/03/2020 1257    Inpatient Medications  Scheduled Meds: . amiodarone  200 mg Oral Daily  . dexamethasone (DECADRON) injection  6 mg Intravenous Q24H  . doxycycline  100 mg Oral Q12H  . famotidine  20 mg Oral BID  .  furosemide  40 mg Oral Daily  . metoprolol tartrate  25 mg Oral BID  . sacubitril-valsartan  1 tablet Oral BID  . warfarin  3.75 mg Oral ONCE-1600  . Warfarin - Pharmacist Dosing Inpatient   Does not apply q1600   Continuous Infusions: . remdesivir 100 mg in NS 100 mL 100 mg (03/07/20 0729)   PRN Meds:.acetaminophen, alum & mag hydroxide-simeth  Micro Results Recent Results (from the past 240 hour(s))  SARS CORONAVIRUS 2 (TAT 6-24 HRS) Nasopharyngeal Nasopharyngeal Swab     Status: Abnormal   Collection Time: 03/03/20  1:58 PM   Specimen: Nasopharyngeal Swab  Result Value Ref Range Status   SARS Coronavirus 2 POSITIVE (A) NEGATIVE Final    Comment: RESULT CALLED TO, READ BACK BY AND VERIFIED WITH: L.VENEGAS RN 1910 03/03/20 MCCORMICK K (NOTE) SARS-CoV-2 target nucleic acids are DETECTED. The SARS-CoV-2 RNA is generally detectable in upper and lower respiratory specimens during the acute phase of infection. Positive results are indicative of the presence of SARS-CoV-2 RNA. Clinical correlation with patient history and other diagnostic information is  necessary to determine patient infection status. Positive results do not rule out bacterial infection or co-infection with other viruses.  The expected result is Negative. Fact Sheet for  Patients: HairSlick.no Fact Sheet for Healthcare Providers: quierodirigir.com This test is not yet approved or cleared by the [place] FDA and  has been authorized for detection and/or diagnosis of SARS-CoV-2 by FDA under an Emergency Use Authorization (EUA). This EUA will remain  in effect (meaning this test can be used) for  the duration of the COVID-19 declaration under Section 564(b)(1) of the Act, 21 U.S.C. section 360bbb-3(b)(1), unless the authorization is terminated or revoked sooner. Performed at [facility], [address] [place], [place] [address]     Radiology Reports DG Chest 2 View  Result Date: 03/03/2020 CLINICAL DATA:  Shortness of breath. Left-sided chest pain. EXAM: CHEST - 2 VIEW COMPARISON:  09/06/2013 FINDINGS: Bilateral lower lobe and patchy right upper lobe airspace disease. Possible small right pleural effusion. No left pleural effusion. No pneumothorax. Stable cardiomediastinal silhouette. No aggressive osseous crash that dual lead cardiac pacemaker. No aggressive osseous lesion. IMPRESSION: Bilateral lower lobe and right upper lobe airspace disease concerning for multilobar pneumonia. Followup PA and lateral chest X-ray is recommended in 3-4 weeks following trial of antibiotic therapy to ensure resolution and exclude underlying malignancy. Electronically Signed   By: Elige Ko   On: 03/03/2020 12:09   CT HEAD WO CONTRAST  Result Date: 03/03/2020 CLINICAL DATA:  Atrial fibrillation, altered mental status EXAM: CT HEAD WITHOUT CONTRAST TECHNIQUE: Contiguous axial images were obtained from the base of the skull through the vertex without intravenous contrast. COMPARISON:  None. FINDINGS: Brain: There is atrophy and chronic small vessel disease changes. No acute intracranial abnormality. Specifically, no hemorrhage, hydrocephalus, mass lesion, acute infarction, or significant intracranial injury.  Vascular: No hyperdense vessel or unexpected calcification. Skull: No acute calvarial abnormality. Sinuses/Orbits: Mucosal thickening.  No acute findings. Other: None IMPRESSION: Atrophy, chronic microvascular disease. No acute intracranial abnormality. Electronically Signed   By: Charlett Nose M.D.   On: 03/03/2020 18:45   ECHOCARDIOGRAM LIMITED  Result Date: 03/04/2020    ECHOCARDIOGRAM LIMITED REPORT   Patient Name:   ASHAWN ORTEGON Date of Exam: 03/04/2020 Medical Rec #:  [id number]       Height:       70.0 in Accession #:    [id number]  Weight:       214.5 lb Date of Birth:  26-Jul-1941       BSA:          2.150 m Patient Age:    78 years        BP:           128/98 mmHg Patient Gender: M               HR:           109 bpm. Exam Location:  Inpatient Procedure: Limited Color Doppler, Cardiac Doppler and Limited Echo Indications:    chest pain 786.50  History:        Patient has prior history of Echocardiogram examinations, most                 recent 06/16/2016. Pacemaker, Covid; Arrythmias:Atrial                 Fibrillation.  Sonographer:    Delcie Roch Referring Phys: [id number] ARSHAD N KAKRAKANDY IMPRESSIONS  1. Left ventricular ejection fraction, by estimation, is 25 to 30%. The left ventricle has severely decreased function. The left ventricle demonstrates global hypokinesis. Left ventricular diastolic function could not be evaluated.  2. Right ventricular systolic function was not well visualized. The right ventricular size is normal. There is normal pulmonary artery systolic pressure. The estimated right ventricular systolic pressure is 21.3 mmHg.  3. Left atrial size was mildly dilated.  4. The mitral valve is normal in structure. Trivial mitral valve regurgitation.  5. The aortic valve is normal in structure. Aortic valve regurgitation is not visualized.  6. The inferior vena cava is normal in size with greater than 50% respiratory variability, suggesting right atrial pressure of 3 mmHg.  Comparison(s): Prior images reviewed side by side. Changes from prior study are noted. The left ventricular function is worsened. FINDINGS  Left Ventricle: Left ventricular ejection fraction, by estimation, is 25 to 30%. The left ventricle has severely decreased function. The left ventricle demonstrates global hypokinesis. Abnormal (paradoxical) septal motion, consistent with left bundle branch block. Left ventricular diastolic function could not be evaluated due to atrial fibrillation. Right Ventricle: The right ventricular size is normal. Right vetricular wall thickness was not assessed. Right ventricular systolic function was not well visualized. There is normal pulmonary artery systolic pressure. The tricuspid regurgitant velocity is 2.14 m/s, and with an assumed right atrial pressure of 3 mmHg, the estimated right ventricular systolic pressure is 21.3 mmHg. Left Atrium: Left atrial size was mildly dilated. Right Atrium: Right atrial size was not well visualized. Pericardium: There is no evidence of pericardial effusion. Mitral Valve: The mitral valve is normal in structure. Trivial mitral valve regurgitation. Tricuspid Valve: The tricuspid valve is normal in structure. Tricuspid valve regurgitation is trivial. Aortic Valve: The aortic valve is normal in structure. Aortic valve regurgitation is not visualized. Pulmonic Valve: The pulmonic valve was not well visualized. Pulmonic valve regurgitation is trivial. Venous: The inferior vena cava is normal in size with greater than 50% respiratory variability, suggesting right atrial pressure of 3 mmHg. IAS/Shunts: The interatrial septum was not well visualized.  LEFT VENTRICLE PLAX 2D LVIDd:         3.70 cm LVIDs:         3.30 cm LV PW:         1.00 cm LV IVS:        1.10 cm LVOT diam:     1.90 cm LVOT Area:  2.84 cm  LEFT ATRIUM         Index LA diam:    3.70 cm 1.72 cm/m  TRICUSPID VALVE TR Peak grad:   18.3 mmHg TR Vmax:        214.00 cm/s  SHUNTS Systemic  Diam: 1.90 cm Sanda Klein MD Electronically signed by Sanda Klein MD Signature Date/Time: 03/04/2020/1:46:24 PM    Final       Phillips Climes M.D on 03/07/2020 at 3:21 PM  Between 7am to 7pm - Pager - [phone]  After 7pm go to [email or web address] - password [facility]  [facility] -  Office  [phone]

## 2020-03-08 LAB — COMPREHENSIVE METABOLIC PANEL WITH GFR
ALT: 16 U/L (ref 0–44)
AST: 14 U/L — ABNORMAL LOW (ref 15–41)
Albumin: 2.7 g/dL — ABNORMAL LOW (ref 3.5–5.0)
Alkaline Phosphatase: 43 U/L (ref 38–126)
Anion gap: 11 (ref 5–15)
BUN: 27 mg/dL — ABNORMAL HIGH (ref 8–23)
CO2: 21 mmol/L — ABNORMAL LOW (ref 22–32)
Calcium: 8.9 mg/dL (ref 8.9–10.3)
Chloride: 103 mmol/L (ref 98–111)
Creatinine, Ser: 0.98 mg/dL (ref 0.61–1.24)
GFR calc Af Amer: >60 mL/min
GFR calc non Af Amer: >60 mL/min
Glucose, Bld: 114 mg/dL — ABNORMAL HIGH (ref 70–99)
Potassium: 4.6 mmol/L (ref 3.5–5.1)
Sodium: 135 mmol/L (ref 135–145)
Total Bilirubin: 0.6 mg/dL (ref 0.3–1.2)
Total Protein: 6.4 g/dL — ABNORMAL LOW (ref 6.5–8.1)

## 2020-03-08 LAB — CBC
HCT: 46.5 % (ref 39.0–52.0)
Hemoglobin: 15.3 g/dL (ref 13.0–17.0)
MCH: 29.8 pg (ref 26.0–34.0)
MCHC: 32.9 g/dL (ref 30.0–36.0)
MCV: 90.5 fL (ref 80.0–100.0)
Platelets: 456 10*3/uL — ABNORMAL HIGH (ref 150–400)
RBC: 5.14 MIL/uL (ref 4.22–5.81)
RDW: 14.2 % (ref 11.5–15.5)
WBC: 11.1 10*3/uL — ABNORMAL HIGH (ref 4.0–10.5)
nRBC: 0 % (ref 0.0–0.2)

## 2020-03-08 LAB — FERRITIN: Ferritin: 306 ng/mL (ref 24–336)

## 2020-03-08 LAB — C-REACTIVE PROTEIN: CRP: 1.1 mg/dL — ABNORMAL HIGH

## 2020-03-08 LAB — PROTIME-INR
INR: 2.9 — ABNORMAL HIGH (ref 0.8–1.2)
Prothrombin Time: 29.9 s — ABNORMAL HIGH (ref 11.4–15.2)

## 2020-03-08 LAB — D-DIMER, QUANTITATIVE: D-Dimer, Quant: 3.59 ug/mL-FEU — ABNORMAL HIGH (ref 0.00–0.50)

## 2020-03-08 MED ORDER — ENTRESTO 24-26 MG PO TABS: 1.0000 | ORAL_TABLET | Freq: Two times a day (BID) | ORAL | 0 refills | Status: DC

## 2020-03-08 MED ORDER — FAMOTIDINE 20 MG PO TABS: 20.0000 mg | ORAL_TABLET | Freq: Two times a day (BID) | ORAL | 0 refills | Status: DC

## 2020-03-08 MED ORDER — DOXYCYCLINE HYCLATE 100 MG PO TABS: 100.0000 mg | ORAL_TABLET | Freq: Two times a day (BID) | ORAL | 0 refills | Status: DC

## 2020-03-08 MED ORDER — WARFARIN SODIUM 1 MG PO TABS
0.5000 mg | ORAL_TABLET | Freq: Once | ORAL | Status: DC
  Filled 2020-03-08: qty 0.5

## 2020-03-08 MED ORDER — FUROSEMIDE 40 MG PO TABS: 40.0000 mg | ORAL_TABLET | Freq: Every day | ORAL | 0 refills | Status: DC

## 2020-03-08 MED ORDER — ACETAMINOPHEN 325 MG PO TABS: 650.0000 mg | ORAL_TABLET | Freq: Four times a day (QID) | ORAL | Status: DC | PRN

## 2020-03-08 MED ORDER — DEXAMETHASONE 6 MG PO TABS: 6.0000 mg | ORAL_TABLET | Freq: Every day | ORAL | 0 refills | Status: DC

## 2020-03-08 NOTE — Plan of Care (Signed)
  Problem: Education: Goal: Knowledge of risk factors and measures for prevention of condition will improve Outcome: Adequate for Discharge   Problem: Education: Goal: Knowledge of disease or condition will improve Outcome: Adequate for Discharge Goal: Understanding of medication regimen will improve Outcome: Adequate for Discharge Goal: Individualized Educational Video(s) Outcome: Adequate for Discharge   Problem: Respiratory: Goal: Will maintain a patent airway Outcome: Adequate for Discharge Goal: Complications related to the disease process, condition or treatment will be avoided or minimized Outcome: Adequate for Discharge

## 2020-03-08 NOTE — Progress Notes (Signed)
ANTICOAGULATION CONSULT NOTE Pharmacy Consult for Warfarin Indication: atrial fibrillation  Allergies  Allergen Reactions  . Bee Venom Shortness Of Breath    BEE STINGS - dizziness   . Corn-Containing Products Anaphylaxis    Corn bread, pop corn----tylenol is OK  . Codeine     REACTION: muscle cramps  . Invert Sugar   . Vanilla Diarrhea    Patient Measurements: Height: 5\' 10"  (177.8 cm) Weight: 103.6 kg (228 lb 6.3 oz) IBW/kg (Calculated) : 73   Vital Signs: Temp: 97.8 F (36.6 C) (04/10 0726) Temp Source: Oral (04/10 0726) BP: 127/80 (04/10 0726) Pulse Rate: 62 (04/10 0726)  Labs: Recent Labs    03/06/20 0243 03/06/20 0243 03/07/20 0256 03/08/20 0237  HGB 14.7   < > 15.1 15.3  HCT 44.2  --  45.5 46.5  PLT 418*  --  439* 456*  LABPROT 33.0*  --  27.1* 29.9*  INR 3.2*  --  2.5* 2.9*  CREATININE 0.96  --  0.90 0.98   < > = values in this interval not displayed.    Estimated Creatinine Clearance: 77.3 mL/min (by C-G formula based on SCr of 0.98 mg/dL).  Assessment: Patient is a 13 yom that presents to the ED in Afib with RVR. The patient was recently treated at an outside hospital for diarrhea and dehydration. The patient is on warfarin at home and pharmacy has been asked to manage the patient's warfarin while inpatient. Upon admission, INR was super therapeutic at 4.5 on 4/5. Warfarin was restarted on 4/8.   PTA warfarin regimen: 3.75mg  Q Sun, Thu and 2.5mg  ROW. Patient now on amiodarone and doxycyline which both can increase the risk of bleed (Increase INR).   4/10 AM: INR 2.9 therapeutic, CBC stable, Scr wnl.  Goal of Therapy:  INR 2-3 Monitor platelets by anticoagulation protocol: Yes   Plan:  - Coumadin 0.5 mg PO x1 - Monitor patient for s/s of bleeding - Daily PT-INR and monitor CBC   Thank you Sun, PharmD  PGY1 Acute Care Pharmacy Resident 03/08/2020 10:47 AM

## 2020-03-08 NOTE — Discharge Instructions (Signed)
Person Under Monitoring Name: [name]  Location: [address] [place] [address]   Infection Prevention Recommendations for Individuals Confirmed to have, or Being Evaluated for, 2019 Novel Coronavirus (COVID-19) Infection Who Receive Care at Home  Individuals who are confirmed to have, or are being evaluated for, COVID-19 should follow the prevention steps below until a healthcare provider or local or state health department says they can return to normal activities.  Stay home except to get medical care You should restrict activities outside your home, except for getting medical care. Do not go to work, school, or public areas, and do not use public transportation or taxis.  Call ahead before visiting your doctor Before your medical appointment, call the healthcare provider and tell them that you have, or are being evaluated for, COVID-19 infection. This will help the healthcare provider's office take steps to keep other people from getting infected. Ask your healthcare provider to call the local or state health department.  Monitor your symptoms Seek prompt medical attention if your illness is worsening (e.g., difficulty breathing). Before going to your medical appointment, call the healthcare provider and tell them that you have, or are being evaluated for, COVID-19 infection. Ask your healthcare provider to call the local or state health department.  Wear a facemask You should wear a facemask that covers your nose and mouth when you are in the same room with other people and when you visit a healthcare provider. People who live with or visit you should also wear a facemask while they are in the same room with you.  Separate yourself from other people in your home As much as possible, you should stay in a different room from other people in your home. Also, you should use a separate bathroom, if available.  Avoid sharing household items You should not share  dishes, drinking glasses, cups, eating utensils, towels, bedding, or other items with other people in your home. After using these items, you should wash them thoroughly with soap and water.  Cover your coughs and sneezes Cover your mouth and nose with a tissue when you cough or sneeze, or you can cough or sneeze into your sleeve. Throw used tissues in a lined trash can, and immediately wash your hands with soap and water for at least 20 seconds or use an alcohol-based hand rub.  Wash your [employer] your hands often and thoroughly with soap and water for at least 20 seconds. You can use an alcohol-based hand sanitizer if soap and water are not available and if your hands are not visibly dirty. Avoid touching your eyes, nose, and mouth with unwashed hands.   Prevention Steps for Caregivers and Household Members of Individuals Confirmed to have, or Being Evaluated for, COVID-19 Infection Being Cared for in the Home  If you live with, or provide care at home for, a person confirmed to have, or being evaluated for, COVID-19 infection please follow these guidelines to prevent infection:  Follow healthcare provider's instructions Make sure that you understand and can help the patient follow any healthcare provider instructions for all care.  Provide for the patient's basic needs You should help the patient with basic needs in the home and provide support for getting groceries, prescriptions, and other personal needs.  Monitor the patient's symptoms If they are getting sicker, call his or her medical provider and tell them that the patient has, or is being evaluated for, COVID-19 infection. This will help the healthcare  provider's office take steps to keep other people from getting infected. Ask the healthcare provider to call the local or state health department.  Limit the number of people who have contact with the patient  If possible, have only one caregiver for the patient.  Other  household members should stay in another home or place of residence. If this is not possible, they should stay  in another room, or be separated from the patient as much as possible. Use a separate bathroom, if available.  Restrict visitors who do not have an essential need to be in the home.  Keep older adults, very young children, and other sick people away from the patient Keep older adults, very young children, and those who have compromised immune systems or chronic health conditions away from the patient. This includes people with chronic heart, lung, or kidney conditions, diabetes, and cancer.  Ensure good ventilation Make sure that shared spaces in the home have good air flow, such as from an air conditioner or an opened window, weather permitting.  Wash your hands often  Wash your hands often and thoroughly with soap and water for at least 20 seconds. You can use an alcohol based hand sanitizer if soap and water are not available and if your hands are not visibly dirty.  Avoid touching your eyes, nose, and mouth with unwashed hands.  Use disposable paper towels to dry your hands. If not available, use dedicated cloth towels and replace them when they become wet.  Wear a facemask and gloves  Wear a disposable facemask at all times in the room and gloves when you touch or have contact with the patient's blood, body fluids, and/or secretions or excretions, such as sweat, saliva, sputum, nasal mucus, vomit, urine, or feces.  Ensure the mask fits over your nose and mouth tightly, and do not touch it during use.  Throw out disposable facemasks and gloves after using them. Do not reuse.  Wash your hands immediately after removing your facemask and gloves.  If your personal clothing becomes contaminated, carefully remove clothing and launder. Wash your hands after handling contaminated clothing.  Place all used disposable facemasks, gloves, and other waste in a lined container before  disposing them with other household waste.  Remove gloves and wash your hands immediately after handling these items.  Do not share dishes, glasses, or other household items with the patient  Avoid sharing household items. You should not share dishes, drinking glasses, cups, eating utensils, towels, bedding, or other items with a patient who is confirmed to have, or being evaluated for, COVID-19 infection.  After the person uses these items, you should wash them thoroughly with soap and water.  Wash laundry thoroughly  Immediately remove and wash clothes or bedding that have blood, body fluids, and/or secretions or excretions, such as sweat, saliva, sputum, nasal mucus, vomit, urine, or feces, on them.  Wear gloves when handling laundry from the patient.  Read and follow directions on labels of laundry or clothing items and detergent. In general, wash and dry with the warmest temperatures recommended on the label.  Clean all areas the individual has used often  Clean all touchable surfaces, such as counters, tabletops, doorknobs, bathroom fixtures, toilets, phones, keyboards, tablets, and bedside tables, every day. Also, clean any surfaces that may have blood, body fluids, and/or secretions or excretions on them.  Wear gloves when cleaning surfaces the patient has come in contact with.  Use a diluted bleach solution (e.g., dilute bleach with  1 part bleach and 10 parts water) or a household disinfectant with a label that says EPA-registered for coronaviruses. To make a bleach solution at home, add 1 tablespoon of bleach to 1 quart (4 cups) of water. For a larger supply, add  cup of bleach to 1 gallon (16 cups) of water.  Read labels of cleaning products and follow recommendations provided on product labels. Labels contain instructions for safe and effective use of the cleaning product including precautions you should take when applying the product, such as wearing gloves or eye protection  and making sure you have good ventilation during use of the product.  Remove gloves and wash hands immediately after cleaning.  Monitor yourself for signs and symptoms of illness Caregivers and household members are considered close contacts, should monitor their health, and will be asked to limit movement outside of the home to the extent possible. Follow the monitoring steps for close contacts listed on the symptom monitoring form.   ? If you have additional questions, contact your local health department or call the epidemiologist on call at [phone] (available 24/7). ? This guidance is subject to change. For the most up-to-date guidance from [facility], please refer to their website: [email or web address]

## 2020-03-08 NOTE — Discharge Summary (Signed)
[name], is a 79 y.o. male  DOB 1941/01/20  MRN [id number].  Admission date:  03/03/2020  Admitting Physician  Eduard Clos, MD  Discharge Date:  03/08/2020   Primary MD  Suzan Slick, MD  Recommendations for primary care physician for things to follow:  - please Check CBC, CMP during next visit.   Admission Diagnosis  Shortness of breath [R06.02] Acute pulmonary edema (HCC) [J81.0] Paroxysmal atrial fibrillation (HCC) [I48.0] Acute on chronic congestive heart failure, unspecified heart failure type (HCC) [I50.9] Acute respiratory failure due to COVID-19 (HCC) [U07.1, J96.00] COVID-19 virus infection [U07.1] Acute respiratory failure with hypoxia (HCC) [J96.01]   Discharge Diagnosis  Shortness of breath [R06.02] Acute pulmonary edema (HCC) [J81.0] Paroxysmal atrial fibrillation (HCC) [I48.0] Acute on chronic congestive heart failure, unspecified heart failure type (HCC) [I50.9] Acute respiratory failure due to COVID-19 (HCC) [U07.1, J96.00] COVID-19 virus infection [U07.1] Acute respiratory failure with hypoxia (HCC) [J96.01]    Principal Problem:   Acute respiratory failure due to COVID-19 [facility]) Active Problems:   PAF (paroxysmal atrial fibrillation) (HCC)   Pacemaker-[employer]ude   Atrial fibrillation with RVR (HCC)   Acute encephalopathy   Acute respiratory failure with hypoxia (HCC)      Past Medical History:  Diagnosis Date  . Anginal pain (HCC)   . Asthma   . Bronchitis   . 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 Dysrhythmia   . History of epididymitis   . History of recurrent UTIs   . History of ruptured appendix   . Paroxysmal atrial fibrillation (HCC)   . Peptic ulcer disease   . Presence of permanent cardiac pacemaker   . Tachy-brady syndrome (HCC)    PPM (SJM) implanted by Dr Graciela Husbands 10/19/99, gen change 04/03/08    Past Surgical History:    Procedure Laterality Date  . EP IMPLANTABLE DEVICE [name] 11/17/2016   Procedure: PPM Generator Changeout;  Surgeon: Marinus Maw, MD;  [facility] IN[facility]AS[facility] CV LAB;  Service: Cardiovascular;  Laterality: [name];  . INSERT / REPLACE / REMOVE PACEMAKER    . PACEMAKER PLACEMENT  03/2008   [employer], [place]. 5826       History of present illness and  Hospital Course:     Kindly see H&P for history of present illness and admission details, please review complete Labs, Consult reports and Test reports for all details in brief  HPI  from the history and physical done on the day of admission 03/04/2020  HPI: [name] is a 79 y.o. male with history of A. fib, tachybradycardia syndrome status post pacemaker placement, cardiomyopathy was notified by the pacemaker company that his heart rate was increased.  He presents to the ER.  Patient states he had recent ER visit when he had diarrhea at that time he was given fluids and since then he has been having lower extremity edema with shortness of breath.  Also complained of some chest tightness.  ED Course: In the ER patient was mildly hypoxic  AF with RVR which converted to sinus rhythm.  Patient was started on amiodarone drip after cardiology was consulted.  On exam patient is volume overloaded bilateral lower extremity edema chest x-ray showing multifocal pneumonia.  Empiric antibiotics were started since patient also stated that he has been confused for last 3 days and had not taken his medications CT head was done which was unremarkable.  My time I examined the patient patient has become more alert awake and oriented.  Patient also was started on IV Lasix for CHF.  Patient's eventual test came back positive as Covid positive.  Hospital Course    Acute respiratory failure with hypoxia secondary to Covid infection and CHF exacerbation: -Patient with no further oxygen requirement, tolerating room air. -Continue with IV Decadron. -Continue with IV  remdesivir day 4?5. -He was encouraged to use  incentive spirometry, flutter valve, and to get out of bed to chair . -Continue to trend inflammatory markers .     Acute on chronic systolic CHF . - History of cardiomyopathy being seen by cardiology see #1. Last EF was 40 to 45% in 2017. -Repeat 2D echo showing significantly reduced EF 25 to 30%, treatment per cardiology, need work-up as an outpatient. -Started on low-dose Entresto.continue with metoprolol. -He did require IV Lasix initially, currently transitioned to p.o. Lasix, will be discharged on Lasix 40 mg oral daily.  Paroxysmal atrial fibrillation -Requiring amiodarone drip initially, currently transitioned to oral amiodarone - continue with metoprolol. -On warfarin for anticoagulation.  Tick bite -Patient noted to have small rash on left lower extremity, reports he noted a tick bite 5 days ago when he removed the tick, started on doxycycline 100 mg p.o. twice daily for prophylaxis.  Finish total of 10 days  History of tachybradycardia  -Status post pacemaker placement   Discharge Condition:  stable   Follow UP  Follow-up Information    Suzan Slick, MD Follow up in 2 week(s).   Specialty: Family Medicine Contact information: [address] [place] [address] [phone]        Jonelle Sidle, MD Follow up in 2 week(s).   Specialty: Cardiology Contact information: [address] [place] [address] [phone]             Discharge Instructions  and  Discharge Medications     Discharge Instructions    Discharge instructions   Complete by: As directed    Follow with Primary MD Suzan Slick, MD in 16 days   Get CBC, CMP,  checked  by Primary MD next visit.    Activity: As tolerated with Full fall precautions use walker/cane & assistance as needed   Disposition Home    Diet: Heart Healthy/Carb modified , with feeding assistance and aspiration  precautions.  For Heart failure patients - Check your Weight same time everyday, if you gain over 2 pounds, or you develop in leg swelling, experience more shortness of breath or chest pain, call your Primary MD immediately. Follow Cardiac Low Salt Diet and 1.5 lit/day fluid restriction.   On your next visit with your primary care physician please Get Medicines reviewed and adjusted.   Please request your Prim.MD to go over all Hospital Tests and Procedure/Radiological results at the follow up, please get all Hospital records sent to your Prim MD by signing hospital release before you go home.   If you experience worsening of your admission symptoms, develop shortness of breath, life threatening emergency, suicidal or  homicidal thoughts you must seek medical attention immediately by calling 911 or calling your MD immediately  if symptoms less severe.  You Must read complete instructions/literature along with all the possible adverse reactions/side effects for all the Medicines you take and that have been prescribed to you. Take any new Medicines after you have completely understood and accpet all the possible adverse reactions/side effects.   Do not drive, operating heavy machinery, perform activities at heights, swimming or participation in water activities or provide baby sitting services if your were admitted for syncope or siezures until you have seen by Primary MD or a Neurologist and advised to do so again.  Do not drive when taking Pain medications.    Do not take more than prescribed Pain, Sleep and Anxiety Medications  Special Instructions: If you have smoked or chewed Tobacco  in the last 2 yrs please stop smoking, stop any regular Alcohol  and or any Recreational drug use.  Wear Seat belts while driving.   Please note  You were cared for by a hospitalist during your hospital stay. If you have any questions about your discharge medications or the care you received while you  were in the hospital after you are discharged, you can call the unit and asked to speak with the hospitalist on call if the hospitalist that took care of you is not available. Once you are discharged, your primary care physician will handle any further medical issues. Please note that NO REFILLS for any discharge medications will be authorized once you are discharged, as it is imperative that you return to your primary care physician (or establish a relationship with a primary care physician if you do not have one) for your aftercare needs so that they can reassess your need for medications and monitor your lab values.   Increase activity slowly   Complete by: As directed    MyChart COVID-19 home monitoring program   Complete by: Mar 08, 2020    Is the patient willing to use the MyChart Mobile App for home monitoring?: Yes   Temperature monitoring   Complete by: Mar 08, 2020    After how many days would you like to receive a notification of this patient's flowsheet entries?: 1     Allergies as of 03/08/2020      Reactions   Bee Venom Shortness Of Breath   BEE STINGS - dizziness    Corn-containing Products Anaphylaxis   Corn bread, pop corn----tylenol is OK   Codeine    REACTION: muscle cramps   Invert Sugar    Vanilla Diarrhea      Medication List    STOP taking these medications   acetaminophen 650 MG CR tablet Commonly known as: TYLENOL Replaced by: acetaminophen 325 MG tablet     TAKE these medications   acetaminophen 325 MG tablet Commonly known as: TYLENOL Take 2 tablets (650 mg total) by mouth every 6 (six) hours as needed for mild pain, fever or headache (T over 101). Replaces: acetaminophen 650 MG CR tablet   ALLERGY MEDICINE PO Take 1 tablet by mouth as needed (allergies).   amiodarone 200 MG tablet Commonly known as: Pacerone Take 1/2 (one-half) tablet by mouth once daily   dexamethasone 6 MG tablet Commonly known as: DECADRON Take 1 tablet (6 mg total) by mouth  daily. Start taking on: March 09, 2020   doxycycline 100 MG tablet Commonly known as: VIBRA-TABS Take 1 tablet (100 mg total) by mouth every 12 (twelve) hours.  Entresto 24-26 MG Generic drug: sacubitril-valsartan Take 1 tablet by mouth 2 (two) times daily.   famotidine 20 MG tablet Commonly known as: PEPCID Take 1 tablet (20 mg total) by mouth 2 (two) times daily.   furosemide 40 MG tablet Commonly known as: LASIX Take 1 tablet (40 mg total) by mouth daily. Start taking on: March 09, 2020   metoprolol tartrate 25 MG tablet Commonly known as: LOPRESSOR Take 1 tablet by mouth twice daily   sertraline 50 MG tablet Commonly known as: ZOLOFT Take 50 mg by mouth daily.   warfarin 2.5 MG tablet Commonly known as: COUMADIN Take as directed. If you are unsure how to take this medication, talk to your nurse or doctor. Original instructions: TAKE 1 TABLET DAILY EXCEPT 1 & 1/2 (ONE & ONE-HALF) TABLETS BY MOUTH ONCE ON SUNDAYS AND THURSDAYS. What changed:   how much to take  how to take this  when to take this  additional instructions         Diet and Activity recommendation: See Discharge Instructions above   Consults obtained -  Cardiology   Major procedures and Radiology Reports - PLEASE review detailed and final reports for all details, in brief -     DG Chest 2 View  Result Date: 03/03/2020 CLINICAL DATA:  Shortness of breath. Left-sided chest pain. EXAM: CHEST - 2 VIEW COMPARISON:  09/06/2013 FINDINGS: Bilateral lower lobe and patchy right upper lobe airspace disease. Possible small right pleural effusion. No left pleural effusion. No pneumothorax. Stable cardiomediastinal silhouette. No aggressive osseous crash that dual lead cardiac pacemaker. No aggressive osseous lesion. IMPRESSION: Bilateral lower lobe and right upper lobe airspace disease concerning for multilobar pneumonia. Followup PA and lateral chest X-ray is recommended in 3-4 weeks following trial of  antibiotic therapy to ensure resolution and exclude underlying malignancy. Electronically Signed   By: Hetal  Patel   On: 03/03/2020 12:09   CT HEAD WO CONTRAST  Result Date: 03/03/2020 CLINICAL DATA:  Atrial fibrillation, altered mental status EXAM: CT HEAD WITHOUT CONTRAST TECHNIQUE: Contiguous axial images were obtained from the base of the skull through the vertex without intravenous contrast. COMPARISON:  None. FINDINGS: Brain: There is atrophy and chronic small vessel disease changes. No acute intracranial abnormality. Specifically, no hemorrhage, hydrocephalus, mass lesion, acute infarction, or significant intracranial injury. Vascular: No hyperdense vessel or unexpected calcification. Skull: No acute calvarial abnormality. Sinuses/Orbits: Mucosal thickening.  No acute findings. Other: None IMPRESSION: Atrophy, chronic microvascular disease. No acute intracranial abnormality. Electronically Signed   By: Kevin  Dover M.D.   On: 03/03/2020 18:45   ECHOCARDIOGRAM LIMITED  Result Date: 03/04/2020    ECHOCARDIOGRAM LIMITED REPORT   Patient Name:   [name] Date of Exam: 03/04/2020 Medical Rec #:  [id number]       Height:       70.0 in Accession #:    [id number]      Weight:       214.5 lb Date of Birth:  03/30/1941       BSA:          2.150 m Patient Age:    78 years        BP:           128/98 mmHg Patient Gender: M               HR:           10 9 bpm. Exam Location:  Inpatient Procedure: Limited Color Doppler, Cardiac Doppler and  Limited Echo Indications:    chest pain 786.50  History:        Patient has prior history of Echocardiogram examinations, most                 recent 06/16/2016. Pacemaker, Covid; Arrythmias:Atrial                 Fibrillation.  Sonographer:    Delcie Roch Referring Phys: [id number] ARSHAD N KAKRAKANDY IMPRESSIONS  1. Left ventricular ejection fraction, by estimation, is 25 to 30%. The left ventricle has severely decreased function. The left ventricle demonstrates global  hypokinesis. Left ventricular diastolic function could not be evaluated.  2. Right ventricular systolic function was not well visualized. The right ventricular size is normal. There is normal pulmonary artery systolic pressure. The estimated right ventricular systolic pressure is 21.3 mmHg.  3. Left atrial size was mildly dilated.  4. The mitral valve is normal in structure. Trivial mitral valve regurgitation.  5. The aortic valve is normal in structure. Aortic valve regurgitation is not visualized.  6. The inferior vena cava is normal in size with greater than 50% respiratory variability, suggesting right atrial pressure of 3 mmHg. Comparison(s): Prior images reviewed side by side. Changes from prior study are noted. The left ventricular function is worsened. FINDINGS  Left Ventricle: Left ventricular ejection fraction, by estimation, is 25 to 30%. The left ventricle has severely decreased function. The left ventricle demonstrates global hypokinesis. Abnormal (paradoxical) septal motion, consistent with left bundle branch block. Left ventricular diastolic function could not be evaluated due to atrial fibrillation. Right Ventricle: The right ventricular size is normal. Right vetricular wall thickness was not assessed. Right ventricular systolic function was not well visualized. There is normal pulmonary artery systolic pressure. The tricuspid regurgitant velocity is 2.14 m/s, and with an assumed right atrial pressure of 3 mmHg, the estimated right ventricular systolic pressure is 21.3 mmHg. Left Atrium: Left atrial size was mildly dilated. Right Atrium: Right atrial size was not well visualized. Pericardium: There is no evidence of pericardial effusion. Mitral Valve: The mitral valve is normal in structure. Trivial mitral valve regurgitation. Tricuspid Valve: The tricuspid valve is normal in structure. Tricuspid valve regurgitation is trivial. Aortic Valve: The aortic valve is normal in structure. Aortic valve  regurgitation is not visualized. Pulmonic Valve: The pulmonic valve was not well visualized. Pulmonic valve regurgitation is trivial. Venous: The inferior vena cava is normal in size with greater than 50% respiratory variability, suggesting right atrial pressure of 3 mmHg. IAS/Shunts: The interatrial septum was not well visualized.  LEFT VENTRICLE PLAX 2D LVIDd:         3.70 cm LVIDs:         3.30 cm LV PW:         1.00 cm LV IVS:        1.10 cm LVOT diam:     1.90 cm LVOT Area:     2.84 cm  LEFT ATRIUM         Index LA diam:    3.70 cm 1.72 cm/m  TRICUSPID VALVE TR Peak grad:   18.3 mmHg TR Vmax:        214.00 cm/s  SHUNTS Systemic Diam: 1.90 cm Rachelle Hora Croitoru MD Electronically signed by Thurmon Fair MD Signature Date/Time: 03/04/2020/1:46:24 PM    Final     Micro Results     Recent Results (from the past 240 hour(s))  SARS CORONAVIRUS 2 (TAT 6-24 HRS) Nasopharyngeal Nasopharyngeal Swab  Status: Abnormal   Collection Time: 03/03/20  1:58 PM   Specimen: Nasopharyngeal Swab  Result Value Ref Range Status   SARS Coronavirus 2 POSITIVE (A) NEGATIVE Final    Comment: RESULT CALLED TO, READ BACK BY AND VERIFIED WITH: L.VENEGAS RN 1910 03/03/20 MCCORMICK K (NOTE) SARS-CoV-2 target nucleic acids are DETECTED. The SARS-CoV-2 RNA is generally detectable in upper and lower respiratory specimens during the acute phase of infection. Positive results are indicative of the presence of SARS-CoV-2 RNA. Clinical correlation with patient history and other diagnostic information is  necessary to determine patient infection status. Positive results do not rule out bacterial infection or co-infection with other viruses.  The expected result is Negative. Fact Sheet for Patients: HairSlick.no Fact Sheet for Healthcare Providers: quierodirigir.com This test is not yet approved or cleared by the [place] FDA and  has been authorized for detection  and/or diagnosis of SARS-CoV-2 by FDA under an Emergency Use Authorization (EUA). This EUA will remain  in effect (meaning this test can be used) for  the duration of the COVID-19 declaration under Section 564(b)(1) of the Act, 21 U.S.C. section 360bbb-3(b)(1), unless the authorization is terminated or revoked sooner. Performed at [facility], [address] [place], [place] [address]        Today   Subjective:   [name] today has no headache,no chest abdominal pain,no new weakness tingling or numbness, feels much better wants to go home today.   Objective:   Blood pressure 127/80, pulse 62, temperature 97.8 F (36.6 C), temperature source Oral, resp. rate 20, height 5\' 10"  (1.778 m), weight 103.6 kg, SpO2 96 %.   Intake/Output Summary (Last 24 hours) at 03/08/2020 1109 Last data filed at 03/08/2020 1031 Gross per 24 hour  Intake 600 ml  Output 700 ml  Net -100 ml    Exam Awake Alert, Oriented x 3, No new F.N deficits, Normal affect Symmetrical Chest wall movement, Good air movement bilaterally, CTAB RRR,No Gallops,Rubs or new Murmurs, No Parasternal Heave +ve B.Sounds, Abd Soft, No organomegaly appriciated, No rebound -guarding or rigidity. No Cyanosis, Clubbing , trace  edema, No new Rash or bruise  Data Review   CBC w Diff:  Lab Results  Component Value Date   WBC 11.1 (H) 03/08/2020   HGB 15.3 03/08/2020   HGB 16.1 03/20/2018   HCT 46.5 03/08/2020   HCT 48.3 03/20/2018   PLT 456 (H) 03/08/2020   PLT 214 03/20/2018   LYMPHOPCT 16 03/04/2020   MONOPCT 9 03/04/2020   EOSPCT 1 03/04/2020   BASOPCT 0 03/04/2020    CMP:  Lab Results  Component Value Date   NA 135 03/08/2020   NA 141 03/20/2018   K 4.6 03/08/2020   CL 103 03/08/2020   CO2 21 (L) 03/08/2020   BUN 27 (H) 03/08/2020   BUN 16 03/20/2018   CREATININE 0.98 03/08/2020   PROT 6.4 (L) 03/08/2020   PROT 7.4 03/20/2018   ALBUMIN 2.7 (L) 03/08/2020   ALBUMIN 4.5 03/20/2018    BILITOT 0.6 03/08/2020   BILITOT 0.6 03/20/2018   ALKPHOS 43 03/08/2020   AST 14 (L) 03/08/2020   ALT 16 03/08/2020  .   Total Time in preparing paper work, data evaluation and todays exam - 35 minutes  Huey Bienenstock M.D on 03/08/2020 at 11:09 AM  [facility]   Office  [phone]

## 2020-03-17 ENCOUNTER — Ambulatory Visit (INDEPENDENT_AMBULATORY_CARE_PROVIDER_SITE_OTHER): Payer: Medicare HMO | Admitting: *Deleted

## 2020-03-17 DIAGNOSIS — I495 Sick sinus syndrome: Secondary | ICD-10-CM | POA: Diagnosis not present

## 2020-03-18 LAB — CUP PACEART REMOTE DEVICE CHECK
Battery Remaining Longevity: 110 mo
Battery Remaining Percentage: 95.5 %
Battery Voltage: 2.99 V
Brady Statistic AP VP Percent: 1 %
Brady Statistic AP VS Percent: 78 %
Brady Statistic AS VP Percent: 1 %
Brady Statistic AS VS Percent: 22 %
Brady Statistic RA Percent Paced: 70 %
Brady Statistic RV Percent Paced: 1.3 %
Date Time Interrogation Session: 20210419033350
Implantable Lead Implant Date: 20001120
Implantable Lead Implant Date: 20001120
Implantable Lead Location: 753859
Implantable Lead Location: 753860
Implantable Pulse Generator Implant Date: 20171220
Lead Channel Impedance Value: 250 Ohm
Lead Channel Impedance Value: 490 Ohm
Lead Channel Pacing Threshold Amplitude: 0.5 V
Lead Channel Pacing Threshold Amplitude: 1 V
Lead Channel Pacing Threshold Pulse Width: 0.5 ms
Lead Channel Pacing Threshold Pulse Width: 0.5 ms
Lead Channel Sensing Intrinsic Amplitude: 5 mV
Lead Channel Sensing Intrinsic Amplitude: 8.5 mV
Lead Channel Setting Pacing Amplitude: 1.5 V
Lead Channel Setting Pacing Amplitude: 2.5 V
Lead Channel Setting Pacing Pulse Width: 0.5 ms
Lead Channel Setting Sensing Sensitivity: 2 mV
Pulse Gen Model: 2272
Pulse Gen Serial Number: 7983565

## 2020-03-18 NOTE — Progress Notes (Signed)
PPM Remote  

## 2020-03-25 ENCOUNTER — Telehealth: Payer: Self-pay | Admitting: Emergency Medicine

## 2020-03-25 NOTE — Telephone Encounter (Signed)
Opened in error

## 2020-03-31 ENCOUNTER — Ambulatory Visit (INDEPENDENT_AMBULATORY_CARE_PROVIDER_SITE_OTHER): Payer: Medicare HMO | Admitting: Internal Medicine

## 2020-03-31 ENCOUNTER — Other Ambulatory Visit: Payer: Self-pay

## 2020-03-31 VITALS — BP 122/70 | HR 66 | Ht 70.0 in | Wt 234.6 lb

## 2020-03-31 DIAGNOSIS — I428 Other cardiomyopathies: Secondary | ICD-10-CM | POA: Diagnosis not present

## 2020-03-31 DIAGNOSIS — R0600 Dyspnea, unspecified: Secondary | ICD-10-CM

## 2020-03-31 DIAGNOSIS — I447 Left bundle-branch block, unspecified: Secondary | ICD-10-CM

## 2020-03-31 DIAGNOSIS — I480 Paroxysmal atrial fibrillation: Secondary | ICD-10-CM

## 2020-03-31 DIAGNOSIS — I495 Sick sinus syndrome: Secondary | ICD-10-CM | POA: Diagnosis not present

## 2020-03-31 DIAGNOSIS — Z950 Presence of cardiac pacemaker: Secondary | ICD-10-CM

## 2020-03-31 MED ORDER — METOPROLOL SUCCINATE ER 25 MG PO TB24: 25.0000 mg | ORAL_TABLET | Freq: Every day | ORAL | 3 refills | Status: DC

## 2020-03-31 MED ORDER — CARVEDILOL 6.25 MG PO TABS: 6.2500 mg | ORAL_TABLET | Freq: Two times a day (BID) | ORAL | 3 refills | Status: DC

## 2020-03-31 NOTE — Patient Instructions (Addendum)
Medication Instructions:  Stop Lopressor  Begin taking Carvedilol 6.25mg  1 tablet twice daily with meal  Increase Furosemide to 80mg  (2 tablets by mouth) x 4 days then return to 40mg  (1 tablet daily by mouth)  Labwork: None ordered.  Testing/Procedures:  Your physician has requested that you have an echocardiogram. Echocardiography is a painless test that uses sound waves to create images of your heart. It provides your doctor with information about the size and shape of your heart and how well your heart's chambers and valves are working. This procedure takes approximately one hour. There are no restrictions for this procedure.     Follow-Up: Your physician wants you to follow-up in: 3 weeks, 04/18/2020 at 1030am with . You will receive a reminder letter in the mail two months in advance. If you don't receive a letter, please call our office to schedule the follow-up appointment.  Remote monitoring is used to monitor your Pacemaker of ICD from home. This monitoring reduces the number of office visits required to check your device to one time per year. It allows 04/20/2020 to keep an eye on the functioning of your device to ensure it is working properly. .  Any Other Special Instructions Will Be Listed Below (If Applicable).  If you need a refill on your cardiac medications before your next appointment, please call your pharmacy.

## 2020-03-31 NOTE — Progress Notes (Signed)
superficial Patient Care Team: Suzan Slick, MD as PCP - General (Family Medicine) Jonelle Sidle, MD as PCP - Cardiology (Cardiology) Lyda Kalata, MD as Surgeon (Internal Medicine) Marinus Maw, MD as Consulting Physician (Cardiology)   HPI  [name] is a 79 y.o. 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   He  has paroxysmal atrial fibrillation on amiodarone.  anticoagulation w coumadin     DATE TEST EF   2013 Echo   30-35 %   7/17 Echo   40-45 %   7/17 Myoview 53% No ischemia reversible defects 2/2 pacing   4/21 Echo  25-30%      Date Cr K TSH Hgb LFTs  12/17 1.09  1.5(1/18) 15.3    4/19 1.20 4.4 2.19 16.1 25  4/21 0.98 4.6 2.093 15.3 16   Hospitalized 4/21 for Covid pneumonia complicated by atrial fibrillation and RVR.  Covid was treated with Decadron and remdesivir concerned about a tick bite and was treated with doxycycline.  It may be remembered that he was infested with bedbugs when he came last time.  He has significant peripheral edema.  Shortness of breath.  No chest pain.  No bleeding.  Past Medical History:  Diagnosis Date  . Anginal pain (HCC)   . Asthma   . Bronchitis   . 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 Dysrhythmia   . History of epididymitis   . History of recurrent UTIs   . History of ruptured appendix   . Paroxysmal atrial fibrillation (HCC)   . Peptic ulcer disease   . Presence of permanent cardiac pacemaker   . Tachy-brady syndrome (HCC)    PPM (SJM) implanted by Dr Graciela Husbands 10/19/99, gen change 04/03/08    Past Surgical History:  Procedure Laterality Date  . EP IMPLANTABLE DEVICE [name] 11/17/2016   Procedure: PPM Generator Changeout;  Surgeon: Marinus Maw, MD;  Location: [facility]IVE [facility];  Service: Cardiovascular;  Laterality: [name];  . INSERT / REPLACE / REMOVE PACEMAKER    . PACEMAKER PLACEMENT  03/2008   [employer] Jude, [place]. 2505     Current Outpatient Medications  Medication Sig Dispense Refill  . acetaminophen (TYLENOL) 325 MG tablet Take 2 tablets (650 mg total) by mouth every 6 (six) hours as needed for mild pain, fever or headache (T over 101).    Marland Kitchen amiodarone (PACERONE) 200 MG tablet Take 1/2 (one-half) tablet by mouth once daily 45 tablet 2  . dexamethasone (DECADRON) 6 MG tablet Take 1 tablet (6 mg total) by mouth daily. 5 tablet 0  . doxycycline (VIBRA-TABS) 100 MG tablet Take 1 tablet (100 mg total) by mouth every 12 (twelve) hours. 14 tablet 0  . famotidine (PEPCID) 20 MG tablet Take 1 tablet (20 mg total) by mouth 2 (two) times daily. 60 tablet 0  . furosemide (LASIX) 40 MG tablet Take 1 tablet (40 mg total) by mouth daily. 30 tablet 0  . Homeopathic Products (ALLERGY MEDICINE PO) Take 1 tablet by mouth as needed (allergies).     . metoprolol tartrate (LOPRESSOR) 25 MG tablet Take 1 tablet by mouth twice daily 180 tablet 2  . sacubitril-valsartan (ENTRESTO) 24-26 MG Take 1 tablet by mouth 2 (two) times daily. 60 tablet 0  . sertraline (ZOLOFT) 50 MG tablet Take 50 mg by mouth daily.     Marland Kitchen warfarin (COUMADIN) 2.5 MG tablet TAKE 1 TABLET DAILY EXCEPT  1 & 1/2 (ONE & ONE-HALF) TABLETS BY MOUTH ONCE ON SUNDAYS AND THURSDAYS. (Patient taking differently: Take 2.5 mg by mouth See admin instructions. Take 1 tablet daily except 1 & 1/2 (one and one half) tablets on Sundays and Thursdays) 115 tablet 0   No current facility-administered medications for this visit.    Allergies  Allergen Reactions  . Bee Venom Shortness Of Breath    BEE STINGS - dizziness   . Corn-Containing Products Anaphylaxis    Corn bread, pop corn----tylenol is OK  . Codeine     REACTION: muscle cramps  . Invert Sugar   . Vanilla Diarrhea    Review of Systems negative except from HPI and PMH  Physical Exam BP 122/70   Pulse 66   Ht 5\' 10"  (1.778 m)   Wt 234 lb 9.6 oz (106.4 kg)   SpO2 96%   BMI 33.66 kg/m    well developed and  nourished in no acute distress HENT normal Neck supple with JVP  Clear Regular rate and rhythm, no murmurs or gallops Abd-soft with active BS No Clubbing cyanosis 2-3+ edema Skin-warm and dry A & Oriented  Grossly normal sensory and motor function  ECG sinus rhythm at 66 16/15/43  Assessment and  Plan  Sinus node dysfunction  Cardiomyopathy--interval worsening  Pacemaker St Jude The patient's device was interrogated.  The information was reviewed. No changes were made in the programming.     Atrial fibrillation-paroxysmal  High Risk Medication Surveillance-amiodarone  LBBB  Gait instability  Atrial fibrillation burden was increased; however, this occurred in the context of his Covid pneumonia.  Settling back down.  No bleeding.  Interval worsening of his cardiomyopathy.  Concurrent with his Covid pneumonia.  Will reassess LV function in about a month, however, we will change his metoprolol tartrate--succinate.  We will continue him on Entresto.  Significantly volume overloaded.  We will increase his furosemide from 40--80 daily x5 days.

## 2020-04-08 ENCOUNTER — Telehealth: Payer: Self-pay

## 2020-04-08 NOTE — Telephone Encounter (Signed)
Merlin alert received- Pt has had increase in AF, at time with RVR.  Pt has known AF on Coumadin for OAC.  Meds include Amiodarone 100mg  daily, Carvedilol 6.25mg  BID.  Pt recently recovering from COVID19 pneumonia, as of 5/3 office notes, AF was settling back down following infection however it does appear to be increasing again.    Spoke with pt, he was not aware of current AF.  Reports he feels fine and has been taking meds as ordered.  Advised will forward to MD for review, anticipate continued monitoring since pt out of AF at this time.

## 2020-04-08 NOTE — Telephone Encounter (Signed)
[name]   when you see him we should have a discussion re amio continued or not in the absence of symptoms with af Thsnk   I willb e there that morning

## 2020-04-14 ENCOUNTER — Other Ambulatory Visit: Payer: Medicare HMO

## 2020-04-14 NOTE — Progress Notes (Signed)
Cardiology Office Note  Date: 04/15/2020   ID: KENN REKOWSKI, DOB [birth year], MRN [id number]  PCP:  Suzan Slick, MD  Cardiologist:  [name], MD Electrophysiologist:  None   Chief Complaint: F/U PAF, Sinus node dysfunction (tachy-brady syndrome), Pacemaker,Cardiomyopathy.  History of Present Illness: [name] is a 79 y.o. male with a history of  PAF, Sinus node dysfunction (tachy-brady syndrome), Pacemaker,Cardiomyopathy.  Last seen by Dr. Graciela Husbands on 03/31/2020 for follow-up pacemaker implant and generator change 2017 for tachybradycardia syndrome, history of paroxysmal atrial fibrillation on amiodarone and anticoagulation with Coumadin.    He had been hospitalized 03/19/2020 for Covid pneumonia complicated by atrial fib with RVR, acute pulmonary edema, acute on chronic systolic HF.  At F/U with Dr Graciela Husbands he was having significant peripheral edema and shortness of breath.  He denied any chest pain or bleeding. He had interval worsening of his cardiomyopathy concurrent with Covid pneumonia.   Dr. Graciela Husbands planned to reassess LV function in approximately 1 month.  His Lopressor was stopped and he was started on Carvedilol 6.25 mg p.o. twice daily.  He continued his Entresto. He was to Increase his Lasix from 40 to 80 mg x 5 days then to return to 40 mg daily d/t significant volume overload.  He presents today with complaints of shortness of breath, states this has been going on since he left the hospital after his Covid infection.  He had followed up with Dr. Graciela Husbands post hospital stay.  Dr. Graciela Husbands stopped his low pressure and started him on carvedilol twice a day and continued his Entresto.  He was to continue his Lasix 40 mg daily after taking an increased dose of 80 x 5 days.  His EKG today shows atrial fibrillation with RVR with a rate 126.  He is short of breath.  He is vague about when the symptoms started.  He states he has been short of breath since he left the hospital.  He  states he has been taking metoprolol tartrate as well as his carvedilol.  He states he has not been taking his Lasix.  He has 3 separate beta-blocker pill bottles with him one is for metoprolol tartrate, the other metoprolol succinate, and carvedilol.  He states he has been taking the metoprolol tartrate and the carvedilol.  He states he ran out of his Lasix.  His blood pressure on initial check was 70/50.  He denies any orthostatic symptoms.  We advised him that there was nothing we could do immediately to resolve his RVR and shortness of breath and he is likely going into failure secondary to his rapid heart rate.  We advised he be taken by EMS to [facility] emergency room.  He adamantly refuses to be transported by EMS to any [facility] facility stating he is very frustrated at the medical community within [facility] system.  I advised him this needed to be treated immediately.  Patient states he would drive himself to [facility] emergency room.  I called the emergency room and spoke to Nani Skillern, RN regarding the fact that patient was driving himself to the hospital and needed immediate attention for his rapid atrial fibrillation and likely evolving CHF.  Patient left on his POV in spite of being advised of the risk of driving himself in this condition.  Past Medical History:  Diagnosis Date  . Anginal pain (HCC)   . Asthma   . Bronchitis   . Cardiomyopathy (HCC)    Possibly  tachycardia mediated  . Dysrhythmia   . History of epididymitis   . History of recurrent UTIs   . History of ruptured appendix   . Paroxysmal atrial fibrillation (HCC)   . Peptic ulcer disease   . Presence of permanent cardiac pacemaker   . Tachy-brady syndrome ([place])    PPM (SJM) implanted by Dr Caryl Comes 10/19/99, gen change 04/03/08    Past Surgical History:  Procedure Laterality Date  . EP IMPLANTABLE DEVICE [name] 11/17/2016   Procedure: PPM Generator Changeout;  Surgeon: Evans Lance, MD;  Location: [place] CV  LAB;  Service: Cardiovascular;  Laterality: [name];  . INSERT / REPLACE / REMOVE PACEMAKER    . PACEMAKER PLACEMENT  03/2008   [employer] Jude, [place]. 5826    Current Outpatient Medications  Medication Sig Dispense Refill  . acetaminophen (TYLENOL) 325 MG tablet Take 2 tablets (650 mg total) by mouth every 6 (six) hours as needed for mild pain, fever or headache (T over 101).    Marland Kitchen amiodarone (PACERONE) 200 MG tablet Take 1/2 (one-half) tablet by mouth once daily 45 tablet 2  . carvedilol (COREG) 6.25 MG tablet Take 1 tablet (6.25 mg total) by mouth 2 (two) times daily with a meal. 180 tablet 3  . metoprolol tartrate (LOPRESSOR) 25 MG tablet Take 25 mg by mouth 2 (two) times daily.    . sacubitril-valsartan (ENTRESTO) 24-26 MG Take 1 tablet by mouth 2 (two) times daily. 60 tablet 0  . sertraline (ZOLOFT) 50 MG tablet Take 50 mg by mouth daily.     Marland Kitchen warfarin (COUMADIN) 2.5 MG tablet TAKE 1 TABLET DAILY EXCEPT 1 & 1/2 (ONE & ONE-HALF) TABLETS BY MOUTH ONCE ON SUNDAYS AND THURSDAYS. (Patient taking differently: Take 2.5 mg by mouth See admin instructions. Take 1 tablet daily except 1 & 1/2 (one and one half) tablets on Sundays and Thursdays) 115 tablet 0  . furosemide (LASIX) 40 MG tablet Take 1 tablet (40 mg total) by mouth daily. (Patient not taking: Reported on 04/15/2020) 30 tablet 0  . Homeopathic Products (ALLERGY MEDICINE PO) Take 1 tablet by mouth as needed (allergies).      No current facility-administered medications for this visit.   Allergies:  Bee venom, Corn-containing products, Codeine, Invert sugar, Rice, and Vanilla   Social History: The patient  reports that he quit smoking about 42 years ago. His smoking use included cigarettes. He started smoking about 60 years ago. He has a 30.00 pack-year smoking history. He has never used smokeless tobacco. He reports that he does not drink alcohol or use drugs.   Family History: The patient's family history includes Cancer in his father;  Suicidality in his mother.   ROS:  Please see the history of present illness. Otherwise, complete review of systems is positive for none.  All other systems are reviewed and negative.   Physical Exam: VS:  BP (!) 70/50 (BP Location: Right Arm, Cuff Size: Large)   Pulse 72   Ht 5\' 10"  (1.778 m)   Wt 232 lb 3.2 oz (105.3 kg)   SpO2 98%   BMI 33.32 kg/m , BMI Body mass index is 33.32 kg/m.  Wt Readings from Last 3 Encounters:  04/15/20 232 lb 3.2 oz (105.3 kg)  03/31/20 234 lb 9.6 oz (106.4 kg)  03/08/20 228 lb 6.3 oz (103.6 kg)    Unable to do a full assessment on patient due to his agitation and frustration. Patient was obviously in respiratory distress with a low blood  pressure.  His heart rate was 126 in atrial flutter with a rapid ventricular response.  His blood pressure was 70/50 but he was not orthostatic.  He was advised he needed immediate attention.  He chose to drive himself to [facility] emergency room to be treated.  Refused to be transported by EMS.  ECG:  An ECG dated 04/15/2020 was personally reviewed today and demonstrated:  Atrial fibrillation with RVR 126, left axis deviation, left bundle branch block  Recent Labwork: 03/03/2020: B Natriuretic Peptide 214.6 03/04/2020: Magnesium 2.0; TSH 2.093 03/08/2020: ALT 16; AST 14; BUN 27; Creatinine, Ser 0.98; Hemoglobin 15.3; Platelets 456; Potassium 4.6; Sodium 135     Component Value Date/Time   CHOL 156 03/04/2020 0249   TRIG 136 03/04/2020 0249   HDL 27 (L) 03/04/2020 0249   CHOLHDL 5.8 03/04/2020 0249   VLDL 27 03/04/2020 0249   LDLCALC 102 (H) 03/04/2020 0249   LDLDIRECT 147.9 08/30/2008 0842    Other Studies Reviewed Today:  Echocardiogram 03/04/2020  1. Left ventricular ejection fraction, by estimation, is 25 to 30%. The left ventricle has severely decreased function. The left ventricle demonstrates global hypokinesis. Left ventricular diastolic function could not be evaluated. 2. Right ventricular systolic  function was not well visualized. The right ventricular size is normal. There is normal pulmonary artery systolic pressure. The estimated right ventricular systolic pressure is 21.3 mmHg. 3. Left atrial size was mildly dilated. 4. The mitral valve is normal in structure. Trivial mitral valve regurgitation. 5. The aortic valve is normal in structure. Aortic valve regurgitation is not visualized. 6. The inferior vena cava is normal in size with greater than 50% respiratory variability, suggesting right atrial pressure of 3 mmHg. Comparison(s): Prior images reviewed side by side. Changes from prior study are noted. The left ventricular function is worsened  Assessment and Plan:  1. Sinoatrial node dysfunction (HCC)   2. Chronic systolic heart failure (HCC)   3. Dyspnea, unspecified type   4. Pacemaker   5. Paroxysmal atrial fibrillation (HCC)    1. Sinoatrial node dysfunction (HCC) Previous pacemaker placement for tachybradycardia syndrome.  Followed by EP  2. Chronic systolic heart failure (HCC) Echo 03/04/2020 EF 25-30% decreased after recent Covid infection. Dr Graciela Husbands has ordered a follow up echocardiogram in 1 month tentatively scheduled for 05/01/2020.  He presents today with dyspnea atrial fibrillation with RVR rate is 126.  He has not been taking his Lasix.  He is apparently taking both carvedilol and metoprolol tartrate.  Advised him he was only supposed to be taking the carvedilol.  Patient is short of breath on arrival with A. fib RVR rate of 126.  Advised him he needs to go to the emergency room for treatment.  Initially called EMS to transport patient to [facility] emergency room.  Patient refuses transport or to go to [facility] emergency room.  He chose to go to [facility] emergency room by POV instead.  3. Dyspnea, unspecified type Patient is short of breath on arrival with him mildly labored breathing/mild distress O2 sat 98% on room air.  Patient has not been taking his Lasix  as ordered by Dr. Graciela Husbands of 40 mg/day. He states he ran out.   4. Pacemaker St Jude pacemaker. Follows with Dr Graciela Husbands. Recent remote check was normal per Dr Graciela Husbands. 03/16/2020   5. Paroxysmal atrial fibrillation (HCC) Recent Afib with RVR 2/2 covid infection.  EKG today shows atrial fibrillation with RVR rate of 126.   Medication Adjustments/Labs and  Tests Ordered: Current medicines are reviewed at length with the patient today.  Concerns regarding medicines are outlined above.   Disposition: Follow-up with Dr Diona Browner or APP  Signed, Rennis Harding, NP 04/15/2020 12:40 PM    [facility] at [facility] [address] [place], [place], [place] [address] Phone: [phone]; Fax: [phone]

## 2020-04-15 ENCOUNTER — Ambulatory Visit (INDEPENDENT_AMBULATORY_CARE_PROVIDER_SITE_OTHER): Payer: Medicare HMO | Admitting: Family Medicine

## 2020-04-15 ENCOUNTER — Encounter: Payer: Self-pay | Admitting: Family Medicine

## 2020-04-15 ENCOUNTER — Other Ambulatory Visit: Payer: Self-pay

## 2020-04-15 VITALS — BP 70/50 | HR 72 | Ht 70.0 in | Wt 232.2 lb

## 2020-04-15 DIAGNOSIS — R0600 Dyspnea, unspecified: Secondary | ICD-10-CM | POA: Diagnosis not present

## 2020-04-15 DIAGNOSIS — I5022 Chronic systolic (congestive) heart failure: Secondary | ICD-10-CM | POA: Diagnosis not present

## 2020-04-15 DIAGNOSIS — I480 Paroxysmal atrial fibrillation: Secondary | ICD-10-CM

## 2020-04-15 DIAGNOSIS — Z950 Presence of cardiac pacemaker: Secondary | ICD-10-CM

## 2020-04-15 DIAGNOSIS — I495 Sick sinus syndrome: Secondary | ICD-10-CM | POA: Diagnosis not present

## 2020-04-15 NOTE — Patient Instructions (Signed)
Medication Instructions:   Your physician recommends that you continue on your current medications as directed. Please refer to the Current Medication list given to you today.  Labwork:  NONE  Testing/Procedures:  NONE  Follow-Up:  Your physician recommends that you schedule a follow-up appointment in: sent to ED-patient chose [facility].  Any Other Special Instructions Will Be Listed Below (If Applicable).  If you need a refill on your cardiac medications before your next appointment, please call your pharmacy.

## 2020-04-16 MED ORDER — AMIODARONE HCL 200 MG PO TABS
100.00 | ORAL_TABLET | ORAL | Status: DC
Start: 2020-04-16 — End: 2020-04-16

## 2020-04-16 NOTE — Addendum Note (Signed)
Addended by: Eustace Moore on: 04/16/2020 09:29 AM   Modules accepted: Orders

## 2020-04-18 ENCOUNTER — Ambulatory Visit: Payer: Medicare HMO | Admitting: Student

## 2020-04-20 LAB — CUP PACEART REMOTE DEVICE CHECK
Battery Remaining Longevity: 108 mo
Battery Remaining Percentage: 95.5 %
Battery Voltage: 2.99 V
Brady Statistic AP VP Percent: 1 %
Brady Statistic AP VS Percent: 60 %
Brady Statistic AS VP Percent: 1 %
Brady Statistic AS VS Percent: 40 %
Brady Statistic RA Percent Paced: 46 %
Brady Statistic RV Percent Paced: 1.5 %
Date Time Interrogation Session: 20210522020018
Implantable Lead Implant Date: 20001120
Implantable Lead Implant Date: 20001120
Implantable Lead Location: 753859
Implantable Lead Location: 753860
Implantable Pulse Generator Implant Date: 20171220
Lead Channel Impedance Value: 240 Ohm
Lead Channel Impedance Value: 400 Ohm
Lead Channel Pacing Threshold Amplitude: 0.5 V
Lead Channel Pacing Threshold Amplitude: 1 V
Lead Channel Pacing Threshold Pulse Width: 0.5 ms
Lead Channel Pacing Threshold Pulse Width: 0.5 ms
Lead Channel Sensing Intrinsic Amplitude: 5 mV
Lead Channel Sensing Intrinsic Amplitude: 9 mV
Lead Channel Setting Pacing Amplitude: 1.5 V
Lead Channel Setting Pacing Amplitude: 2.5 V
Lead Channel Setting Pacing Pulse Width: 0.5 ms
Lead Channel Setting Sensing Sensitivity: 2 mV
Pulse Gen Model: 2272
Pulse Gen Serial Number: 7983565

## 2020-04-29 ENCOUNTER — Encounter: Payer: Self-pay | Admitting: Internal Medicine

## 2020-05-05 ENCOUNTER — Telehealth: Payer: Self-pay

## 2020-05-05 MED ORDER — ACETAMINOPHEN 325 MG PO TABS
650.00 | ORAL_TABLET | ORAL | Status: DC
Start: ? — End: 2020-05-05

## 2020-05-05 MED ORDER — CARVEDILOL 3.125 MG PO TABS
6.25 | ORAL_TABLET | ORAL | Status: DC
Start: 2020-05-03 — End: 2020-05-05

## 2020-05-05 MED ORDER — AMIODARONE HCL 200 MG PO TABS
200.00 | ORAL_TABLET | ORAL | Status: DC
Start: 2020-05-04 — End: 2020-05-05

## 2020-05-05 MED ORDER — LIDOCAINE HCL 2 % EX GEL
CUTANEOUS | Status: DC
Start: ? — End: 2020-05-05

## 2020-05-05 MED ORDER — SERTRALINE HCL 50 MG PO TABS
50.00 | ORAL_TABLET | ORAL | Status: DC
Start: 2020-05-04 — End: 2020-05-05

## 2020-05-05 MED ORDER — SACUBITRIL-VALSARTAN 24-26 MG PO TABS
1.00 | ORAL_TABLET | ORAL | Status: DC
Start: 2020-05-03 — End: 2020-05-05

## 2020-05-05 MED ORDER — GENERIC EXTERNAL MEDICATION
Status: DC
Start: ? — End: 2020-05-05

## 2020-05-05 MED ORDER — WARFARIN SODIUM 2.5 MG PO TABS
2.50 | ORAL_TABLET | ORAL | Status: DC
Start: 2020-05-03 — End: 2020-05-05

## 2020-05-05 MED ORDER — DEXTROSE 10 % IV SOLN
125.00 | INTRAVENOUS | Status: DC
Start: ? — End: 2020-05-05

## 2020-05-05 NOTE — Telephone Encounter (Signed)
Merlin alert- Alert for long AF >1 day and RVR at times. Presenting ASVS 60's. On pacerone, coreg, lopressor and coumadin.    Spoke with pt, he reports he was discharged from [facility] on 05/04/20 during hospitalization changes were made in medications however he did not want to provide those changes at time of call as he did not want to et his medications out. Pt did confirm that he continues to take Coumadin for AF.   Advised pt he should have a f/u visit with Dr. Graciela Husbands to review the changes and ongoing management of Amiodarone.

## 2020-06-02 NOTE — Progress Notes (Signed)
Cardiology Office Note Date:  06/04/2020  Patient ID:  [name], [name] 1941/01/17, MRN [id number] PCP:  Suzan Slick, MD  Cardiologist:  [name] EP: Dr. Graciela Husbands     Chief Complaint:  AFib burden, recent hospitalization with HF exacerbation   History of Present Illness: [name] is a 79 y.o. 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  He was hospitalized April 2021 with HF exacerbation, HS trop ,mildly elevated felt demand w/HF and  He was also COVID positive Cardiology service s/o 4/8, his EF down to 25-30%, started on Entresto, transitioned to oral lasix.  He converted to SR/Av paced rhythm and transitioned to his home oral dose of amiodarone.  He saw Dr. Graciela Husbands 03/31/20, (he recalled the last office visit the pt was infested with bedbugs).  He noted significant peripheral edema and SOB.  His lopressor was changed to toprol, and lasix double for 5 days.  F/u with cardiology service, Verita Schneiders, NP on 04/15/2020.  He was in Afib w/RVR, rate 126.  He had 3 BB pill bottle, tartrate, succinate and coreg, was taking the succinate and coreg, he was not taking the lasix.  He was hypotensive though not symptomatic with this, recommended he be brought via [facility] though he refused and voiced frustration with the [facility] system and insisted on driving himself to [facility].   Review of care everywhere 05/23/2020 there is a note of "termination of physician/patient relationship by the patient" with [facility] 05/19/20: "No follow up planned for returning to PCP office. Advised pt to find another provider for his care due to confrontational and rude behavior towards provider" At this visit is noted the patient voiced he did not intend on following up with pulmonology despite recommendation    Admitted 04/27/20 > discharged 6/5/2021Hospitalization [facility]  Discharge Follow-up Action Items: 1. Follow-up  with primary care doctor regarding ongoing need for oxygen and hospital FU 2. Follow-up with pulmonology in 1 month regarding amiodarone toxicity, ct scan, and resolution of pulmonary abnormalities. 3. Follow-up with cardiology in 2-4 weeks regarding amiodarone management and possible transition to other antiarrhythmics if amiodarone toxicity is still in the differential. Additionally he will need an ECHO outpatient to monitor for EF recovery. If no recovery, he will need cath.  Hospital Course:  [name] is 79 year old male with history of AFIB (on warfarin; PPM), HFrEF and BPH who presented to OSH with complaint of SOB. The patient reported symptoms had been present for 2 days. Constant and progressively worsening. No modifying factors. There is associated cough. No fever. No lower extremity swelling. No CP or SOB. No orthopnea or PND. In the ED the patient was afebrile and hypoxemic with increased WOB. CBC revealed WBC normal. CMP normal electrolytes and renal function. Troponin 700 and decreased to 600 on repeat. CXR consistent with interstitial edema and volume overload. EKG revealed AFIB with RVR. The patient was placed on BiPAP and transferred for additional management.   Patient received in CCU in stable condition. BiPAP quickly transitioned to optiflow. TTE obtained and showed EF: 20-25% without effusion or wall motion abnormalities. The patient was diuresed with 60 IV lasix BID. He had appropriate urine outpatient and rapidly reached euvolemia. It was determined on chart review that EF was not newly reduced and the patient would benefit from consideration of ischemic evaluation outpatient. The patient did require transient IV amiodarone infusion for AFIB with RVR. Rate  control was achieved and he was restarted on home PO amiodarone (dose increased to 200). Warfarin was continued for anticoagulation throughout hospitalization.   Of note, the patient has history of recent admission to OSH for  COVID PNA. He continued to require oxygen after euvolemia achieved. We therefore obtained CT chest that revealed findings consistent with UIP. Pulmonology was consulted and after discussion with their team, we elected to obtain follow up CT chest in 3 weeks. His amio may need to be discontinued if his pulm abnormalities are thought to be related to amio toxicity on FU CT.   Prior to discharge, his carvedilol and entresto were uptitrated. He was educated regarding his need for lasix PRN if his weight were to increase. He will need an outpatient ECHO to determine the EF for possible cath and revascularization in 2-4 weeks. His EF reduction was thought to be secondary to resp failure however this may require further workup.     04/28/2020: TTE SUMMARY  The left ventricular size is normal.  Left ventricular systolic function is severely reduced.  LV ejection fraction = 20-25%.  Left ventricular filling pattern is prolonged relaxation.  Abnormal (paradoxical) septal motion consistent with LBBB.  Unable to fully assess LV regional wall motion  The right ventricle is normal size.  The right ventricular systolic function is mildly reduced.  There is mild mitral regurgitation.  No significant stenosis seen  There was insufficient TR detected to calculate RV systolic pressure.  Estimated right atrial pressure is 5 mmHg.Marland Kitchen  There is no pericardial effusion.  There is no comparison study available.      CT CHEST WO CONTRAST 04/29/2020 Final Result  1. Bilateral diffuse subpleural reticulations, interlobular septal thickening with traction bronchiectasis, upper lobe predominance. Heterogenous groundglass opacities with upper lobe predominant air trapping. No definite bronchiectasis. 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 Differential considerations include chronic hypersensitivity  pneumonitis. Consider pulmonology consult.  2. Cardiomegaly with superimposed mild pulmonary interstitial edema.  3. Additional patchy airspace disease involving upper lobes, may favor acute exacerbation of interstitial lung disease versus multifocal infection.  Imaging: (images independently reviewed) CT chest w/o contrast (04/29/2020):  Bilateral diffuse subpleural reticulations, interlobular septal thickening with traction bronchiectasis, upper lobe predominance. Heterogenous groundglass opacities with upper lobe predominant air trapping. No definite bronchiectasis.    Patient had bronchoscopy with BAL on 05/02/2020 with BAL differential showing 50% lymphocytes and 40% macrophages with only 1% eosinophils. BAL resp cx and PJP negative. AFB and fungal cx pending. Amiodarone lung toxicity is a diagnosis of exclusion. Bronch does help to rule out infectious process and acute eosinophilic PNA. However, bronch does not prove diagnosis of amiodarone lung toxicity. In this gentleman with recent COVID, certainly changes in his CT chest could be 2/2 fibrosis from COVID vs. Amiodarone lung toxicity.     Our device clinic noted 6/7 increase in Afib with an episode of >1 day with RVR at times.  The pt reported he had just been discharged from [facility] 6/6 and his medicines had been adjusted he did not want to review the changes with the nurse, she advised Dr. Graciela Husbands follow up.  He did confirm compliance with his warfarin.   TODAY He comes in today bring his O2 but tank was off.   He mentions DOE since the 1st hospital stay but denies rest SOB and denies symptoms of PND or orthopnea.  No near syncope or syncope.  When asked about CP, he mentions an occasional fleeting sharp pain far left low thorax, none otherwise.  This with no pattern or clear trigger.  No palpitations or cardiac awareness.  His biggest concern is his back pain, this he mentions is why he fired his PMD, stated that she told him she would prescribe  him pain medicine but did not.  He does not plan on following up with pulmonology, this is to inconvenient in location and the burden of another doctor's visit among all the others.  Is just too much.  He is not taking the lasix every day because he says his PMD prescribed it for him and he does not want to take anything she gave him.  But has used it at least once in the last couple weeks for some ankle swelling that resolved with a single dose. He also mentions that it makes him urinate too much and would not take it every day anyway.  O2 sat on RA after walking in from the waiting room was 93%, increased to 97% on 2L n/c He reports at home he will take it off for several hours, sometimes his O2 sat will get into the 80's and sometimes on RA with ambulation to high 70's  He was very unhappy being on my schedule today rather then seeing Dr. 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   Dysrhythmia    History of epididymitis    History of recurrent UTIs    History of ruptured appendix    Paroxysmal atrial fibrillation (HCC)    Peptic ulcer disease    Presence of permanent cardiac pacemaker    Tachy-brady syndrome (HCC)    PPM (SJM) implanted by Dr Graciela Husbands 10/19/99, gen change 04/03/08    Past Surgical History:  Procedure Laterality Date   EP IMPLANTABLE DEVICE [name] 11/17/2016   Procedure: PPM Generator Changeout;  Surgeon: Marinus Maw, MD;  Location: [facility] INVASIVE CV LAB;  Service: Cardiovascular;  Laterality: [name];   INSERT / REPLACE / REMOVE PACEMAKER     PACEMAKER PLACEMENT  03/2008   [employer], [place]. [address]    Current Outpatient Medications  Medication Sig Dispense Refill   acetaminophen (TYLENOL) 325 MG tablet Take 2 tablets (650 mg total) by mouth every 6 (six) hours as  needed for mild pain, fever or headache (T over 101).     amiodarone (PACERONE) 200 MG tablet Take 1/2 (one-half) tablet by mouth once daily 45 tablet 2   carvedilol (COREG) 6.25 MG tablet Take 1 tablet (6.25 mg total) by mouth 2 (two) times daily with a meal. 180 tablet 3   furosemide (LASIX) 40 MG tablet Take 1 tablet (40 mg total) by mouth daily. 30 tablet 0   Homeopathic Products (ALLERGY MEDICINE PO) Take 1 tablet by mouth as needed (allergies).      sacubitril-valsartan (ENTRESTO) 24-26 MG Take 1 tablet by mouth 2 (two) times daily. 60 tablet 0   sertraline (ZOLOFT) 50 MG tablet Take 50 mg by mouth daily.      warfarin (COUMADIN) 2.5 MG tablet TAKE 1 TABLET DAILY EXCEPT 1 & 1/2 (ONE & ONE-HALF) TABLETS BY MOUTH ONCE ON SUNDAYS AND THURSDAYS. (Patient taking differently: Take 2.5 mg by mouth See admin instructions. Take 1 tablet daily  except 1 & 1/2 (one and one half) tablets on Sundays and Thursdays) 115 tablet 0   No current facility-administered medications for this visit.    Allergies:   Bee venom, Corn-containing products, Codeine, Invert sugar, Rice, and Vanilla   Social History:  The patient  reports that he quit smoking about 42 years ago. His smoking use included cigarettes. He started smoking about 60 years ago. He has a 30.00 pack-year smoking history. He has never used smokeless tobacco. He reports that he does not drink alcohol and does not use drugs.   Family History:  The patient's family history includes Cancer in his father; Suicidality in his mother.  ROS:  Please see the history of present illness.  All other systems are reviewed and otherwise negative.   PHYSICAL EXAM:  VS:  BP (!) 92/56    Pulse (!) 58    Ht  (1.778 m)    Wt 223 lb (101.2 kg)    SpO2 93%    BMI 32.00 kg/m  BMI: Body mass index is 32 kg/m. Well nourished, well developed, in no acute distress  HEENT: normocephalic, atraumatic  Neck: no JVD, carotid bruits or masses Cardiac: RRR; no  significant murmurs, no rubs, or gallops Lungs:  CTA b/l, no wheezing, rhonchi or rales  Abd: soft, nontender MS: no deformity or atrophy Ext: trace edema only Skin: warm and dry, no rash Neuro:  No gross deficits appreciated Psych: euthymic mood, unusual affect  PPM site is stable, no tethering or discomfort   EKG:  Not done today (his pacer check he presents AP/VS and AS/VS)  PPM interrogation done today and reviewed by myself: Battery and lead measurements are good AFib burden is 34%, with increased burden in the last 3-2mo and RVR noted a fair amount of time by histograms as well.  04/28/2020: TTE ([place]) SUMMARY  The left ventricular size is normal.  Left ventricular systolic function is severely reduced.  LV ejection fraction = 20-25%.  Left ventricular filling pattern is prolonged relaxation.  Abnormal (paradoxical) septal motion consistent with LBBB.  Unable to fully assess LV regional wall motion  The right ventricle is normal size.  The right ventricular systolic function is mildly reduced.  There is mild mitral regurgitation.  No significant stenosis seen  There was insufficient TR detected to calculate RV systolic pressure.  Estimated right atrial pressure is 5 mmHg.Marland Kitchen  There is no pericardial effusion.  There is no comparison study available.     Echocardiogram 03/04/2020 1. Left ventricular ejection fraction, by estimation, is 25 to 30%. The left ventricle has severely decreased function. The left ventricle demonstrates global hypokinesis. Left ventricular diastolic function could not be evaluated. 2. Right ventricular systolic function was not well visualized. The right ventricular size is normal. There is normal pulmonary artery systolic pressure. The estimated right ventricular systolic pressure is 21.3 mmHg. 3. Left atrial size was mildly dilated. 4. The mitral valve is normal in structure. Trivial mitral valve regurgitation. 5. The aortic valve is normal  in structure. Aortic valve regurgitation is not visualized. 6. The inferior vena cava is normal in size with greater than 50% respiratory variability, suggesting right atrial pressure of 3 mmHg. Comparison(s): Prior images reviewed side by side. Changes from prior study are noted. The left ventricular function is worsened    Recent Labs: 03/03/2020: B Natriuretic Peptide 214.6 03/04/2020: Magnesium 2.0; TSH 2.093 03/08/2020: ALT 16; BUN 27; Creatinine, Ser 0.98; Hemoglobin 15.3; Platelets 456; Potassium 4.6; Sodium 135  03/04/2020: Cholesterol 156; HDL 27; LDL Cholesterol 102; Total CHOL/HDL Ratio 5.8; Triglycerides 136; VLDL 27   CrCl cannot be calculated (Patient's most recent lab result is older than the maximum 21 days allowed.).   Wt Readings from Last 3 Encounters:  06/04/20 223 lb (101.2 kg)  04/15/20 232 lb 3.2 oz (105.3 kg)  03/31/20 234 lb 9.6 oz (106.4 kg)     Other studies reviewed: Additional studies/records reviewed today include: summarized above  ASSESSMENT AND PLAN:  1. PPM    Intact function, no programming changes made  2. Paroxysmal Afib     CHADs2vasc is 3, on warfarin, he reports checked at our [facility] office coumadin clinic and reports compliance with this, though has been in the hospital, and when he goes to see [name] he will have them check his INR, declines a check today     increased burden, in frequency with poorly controlled AF riates    On amioadrone  3. CM     Suspect tachy mediated vs post COVID change     Chronic CHF     Denies symptoms of angina     LVEF has worsened, from 2017 was 40-45% >> 20-25%     BP marginal on coreg and entresto     Exam does not suggest overt volume OL today, he is using his lasix PRN  Initially metoprolol and coreg were listed as medicines for him, he clarified for me that he was not taking the metoprolol, was in a separate bag and knew that this was an old medicine.  4. Oxygen dependence since his hospitalization  in April/COVID infection     Advised to wear his O2 as he has been instructed to and educated on the use of his tank and danger of allowing his O2 sats to get as low as he has     Dr. Graciela Husbands came and spoke with the patient about a few concerns and recommendations.  1. Afib poorly controlled and with RVR and the potential consequences of this.    Further worsening of his CM, more CHF particularly 2. Discussed concernss of his CT scans and bronchoscopy, amio tox vs post COVID changes.  Recommended stopping the amiodarone (and adding digoxin, and further changes below), that if his lung changes are amiodarone related they will continue to worsen and could be lethal I also discussed the importance of pulmonary follow up to aid in management  3. BP is slightly low today (asymptomatic)     recommended stopping Entresto and starting losartan to allow better BP and titration of his coreg for better HR control  Despite Dr. Koren Bound discussions (as well as mine) and concerns the patient reports that while dying a death of inability to breath is terrufying, he does not think her feels bad and is not inclined to make any changes at this time.  He would like to follow up with [name], scheduled for 7/20 and see what he wants him to do   Disposition: F/u with [name] as scheduled, continue Remotes q3 months and Dr. Graciela Husbands (patient very much prefers to only see Dr. Graciela Husbands) in 23mo, sooner if needed   Current medicines are reviewed at length with the patient today.  The patient did not have any concerns regarding medicines.  Norma Fredrickson, PA-C 06/04/2020 4:10 PM     [facility] [address] [place] [address] [phone] (office)  [phone] (fax)

## 2020-06-04 ENCOUNTER — Other Ambulatory Visit: Payer: Self-pay

## 2020-06-04 ENCOUNTER — Ambulatory Visit (INDEPENDENT_AMBULATORY_CARE_PROVIDER_SITE_OTHER): Payer: Medicare HMO | Admitting: Physician Assistant

## 2020-06-04 VITALS — BP 92/56 | HR 58 | Ht 70.0 in | Wt 223.0 lb

## 2020-06-04 DIAGNOSIS — I480 Paroxysmal atrial fibrillation: Secondary | ICD-10-CM

## 2020-06-04 DIAGNOSIS — Z950 Presence of cardiac pacemaker: Secondary | ICD-10-CM

## 2020-06-04 DIAGNOSIS — I4891 Unspecified atrial fibrillation: Secondary | ICD-10-CM

## 2020-06-04 DIAGNOSIS — I5022 Chronic systolic (congestive) heart failure: Secondary | ICD-10-CM

## 2020-06-04 DIAGNOSIS — I428 Other cardiomyopathies: Secondary | ICD-10-CM | POA: Diagnosis not present

## 2020-06-04 NOTE — Patient Instructions (Signed)
Medication Instructions:   Your physician recommends that you continue on your current medications as directed. Please refer to the Current Medication list given to you today.  *If you need a refill on your cardiac medications before your next appointment, please call your pharmacy*   Lab Work: NONE ORDERED  TODAY   If you have labs (blood work) drawn today and your tests are completely normal, you will receive your results only by: . MyChart Message (if you have MyChart) OR . A paper copy in the mail If you have any lab test that is abnormal or we need to change your treatment, we will call you to review the results.   Testing/Procedures: NONE ORDERED  TODAY   Follow-Up: At [facility], you and your health needs are our priority.  As part of our continuing mission to provide you with exceptional heart care, we have created designated Provider Care Teams.  These Care Teams include your primary Cardiologist (physician) and Advanced Practice Providers (APPs -  Physician Assistants and Nurse Practitioners) who all work together to provide you with the care you need, when you need it.  We recommend signing up for the patient portal called "MyChart".  Sign up information is provided on this After Visit Summary.  MyChart is used to connect with patients for Virtual Visits (Telemedicine).  Patients are able to view lab/test results, encounter notes, upcoming appointments, etc.  Non-urgent messages can be sent to your provider as well.   To learn more about what you can do with MyChart, go to [email or web address].    Your next appointment:   6 month(s)  The format for your next appointment:   In Person  Provider:   You may see Dr. Klein  or one of the following Advanced Practice Providers on your designated Care Team:    Amber Seiler, NP  Renee Ursuy, PA-C  Michael "Andy" Tillery, PA-C    Other Instructions   

## 2020-06-12 ENCOUNTER — Other Ambulatory Visit: Payer: Self-pay | Admitting: Cardiology

## 2020-06-16 ENCOUNTER — Ambulatory Visit (INDEPENDENT_AMBULATORY_CARE_PROVIDER_SITE_OTHER): Payer: Medicare HMO | Admitting: *Deleted

## 2020-06-16 ENCOUNTER — Encounter: Payer: Self-pay | Admitting: Cardiology

## 2020-06-16 DIAGNOSIS — I4891 Unspecified atrial fibrillation: Secondary | ICD-10-CM | POA: Diagnosis not present

## 2020-06-16 NOTE — Progress Notes (Signed)
Cardiology Office Note  Date: 06/17/2020   ID: [name], [name] 1941-07-15, MRN [id number]  PCP:  Suzan Slick, MD  Cardiologist:  [name], MD Electrophysiologist:  [name], MD   Chief Complaint  Patient presents with  . Cardiac follow-up    History of Present Illness: [name] is a 79 y.o. male that I last assessed via telehealth encounter in May of last year - I reviewed substantial interval records.  He was last seen in May by Mr. Vincenza Hews NP.  Recent follow-up with [name] in the device clinic noted in early July following hospitalization at [facility].  Echocardiogram during that stay reported LVEF 20 to 25% range with diffuse hypokinesis noted in the setting of rapid atrial fibrillation (already had documentation of similar LV dysfunction in the setting of COVID-19 pneumonia prior to this hospital stay).  He was treated with IV diuresis, also IV amiodarone along with Coumadin.  Chest CT suggested UIP and there was some concern about potential for amiodarone toxicity.  He was also treated with carvedilol and Entresto.  [name] recommended stopping amiodarone and adding Lanoxin given concerns about potential amiodarone toxicity, also switching from Entresto to losartan to allow more blood pressure room for up titration of AV nodal blockers given increased AF burden.  [name] declined any medication changes at that point however.  He presents today for follow-up.  He brought in a bag of multiple medications, few of which he states that he has been taking regularly.  As best I can ascertain, he has been taking his Entresto on a regular basis along with Coumadin and amiodarone.  He had bottles of both Coreg and metoprolol, neither of which he is taking.  Also not taking Lasix regularly.  His weight has been increasing.    Vital signs are stable today, better that at last office visit with EP.  His systolic is 126, heart rate 60s and his ECG shows sinus rhythm  today.  I had a long talk with [name] today about reasonable medication choices in light of above discussion and concerns.  He agreed to come off of amiodarone.  I have asked him to continue Entresto and Coumadin, start back on Coreg, and use his Lasix on a regular basis.  He is going to continue to have breakthrough atrial fibrillation and hopefully heart rate control will be adequate with beta-blocker alone, if not we might consider adding Lanoxin next.  I had a nurse come in with me at the end of the visit so that we can clarify all of his medications and dispose of old unnecessary bottles.  He was not wearing his oxygen today, oxygen saturation was 97% at rest.  He states that he only uses it at nighttime.  Past Medical History:  Diagnosis Date  . Anginal pain (HCC)   . Asthma   . Bronchitis   . 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 History of epididymitis   . History of recurrent UTIs   . History of ruptured appendix   . Paroxysmal atrial fibrillation (HCC)   . Peptic ulcer disease   . Pneumonia due to COVID-19 virus   . Presence of permanent cardiac pacemaker   . Tachy-brady syndrome (HCC)    PPM (SJM) implanted by Dr Graciela Thomas 10/19/99, gen change 04/03/08    Past Surgical History:  Procedure Laterality Date  . EP IMPLANTABLE DEVICE [name] 11/17/2016   Procedure: PPM Generator Changeout;  Surgeon:  Marinus Maw, MD;  Location: [facility] INVASIVE CV LAB;  Service: Cardiovascular;  Laterality: [name];  . INSERT / REPLACE / REMOVE PACEMAKER    . PACEMAKER PLACEMENT  03/2008   [employer], [place]. 0947    Current Outpatient Medications  Medication Sig Dispense Refill  . acetaminophen (TYLENOL) 325 MG tablet Take 2 tablets (650 mg total) by mouth every 6 (six) hours as needed for mild pain, fever or headache (T over 101).    . Homeopathic Products (ALLERGY MEDICINE PO) Take 1 tablet by mouth as needed (allergies).     . sacubitril-valsartan (ENTRESTO) 24-26 MG Take 1 tablet  by mouth 2 (two) times daily. 180 tablet 2  . warfarin (COUMADIN) 2.5 MG tablet TAKE 1 TABLET BY MOUTH ONCE DAILY EXCEPT  1  AND  1/2  TABLETS  ONCE  ON  SUNDAYS  AND  THURSDAYS 30 tablet 0  . carvedilol (COREG) 6.25 MG tablet Take 1 tablet (6.25 mg total) by mouth 2 (two) times daily. 180 tablet 3  . furosemide (LASIX) 40 MG tablet Take 1 tablet (40 mg total) by mouth daily. 90 tablet 2   No current facility-administered medications for this visit.   Allergies:  Bee venom, Corn-containing products, Codeine, Invert sugar, Rice, and Vanilla   Social History: The patient  reports that he quit smoking about 42 years ago. His smoking use included cigarettes. He started smoking about 60 years ago. He has a 30.00 pack-year smoking history. He has never used smokeless tobacco. He reports that he does not drink alcohol and does not use drugs.   Family History: The patient's family history includes Cancer in his father; Suicidality in his mother.   ROS:   Hearing loss.  Chronic shortness of breath, NYHA class II-III.  Physical Exam: VS:  BP 126/68   Pulse 60   Ht 5\' 10"  (1.778 m)   Wt 227 lb (103 kg)   SpO2 97%   BMI 32.57 kg/m , BMI Body mass index is 32.57 kg/m.  Wt Readings from Last 3 Encounters:  06/17/20 227 lb (103 kg)  06/04/20 223 lb (101.2 kg)  04/15/20 232 lb 3.2 oz (105.3 kg)    General: Chronically ill-appearing male in no distress. HEENT: Conjunctiva and lids normal, wearing a mask. Neck: Supple, no elevated JVP or carotid bruits, no thyromegaly. Lungs: Diminished breath sounds without wheezing or active crackles. Cardiac: Indistinct PMI, regular rate and rhythm, no S3. Abdomen: Protuberant, bowel sounds present, no guarding or rebound. Extremities: Mild lower leg and ankle edema, distal pulses 2+. Skin: Warm and dry.  ECG:  An ECG dated 04/15/2020 was personally reviewed today and demonstrated:  Atrial fibrillation with RVR, left bundle branch block.  Recent  Labwork: 03/03/2020: B Natriuretic Peptide 214.6 03/04/2020: Magnesium 2.0; TSH 2.093 03/08/2020: ALT 16; AST 14; BUN 27; Creatinine, Ser 0.98; Hemoglobin 15.3; Platelets 456; Potassium 4.6; Sodium 135     Component Value Date/Time   CHOL 156 03/04/2020 0249   TRIG 136 03/04/2020 0249   HDL 27 (L) 03/04/2020 0249   CHOLHDL 5.8 03/04/2020 0249   VLDL 27 03/04/2020 0249   LDLCALC 102 (H) 03/04/2020 0249   LDLDIRECT 147.9 08/30/2008 0842  May 2021: Hemoglobin 14.3, platelets 470, potassium 4.9, BUN 27, creatinine 0.95, INR 2.08  Other Studies Reviewed Today:  Echocardiogram 04/28/2020 [facility]): SUMMARY  The left ventricular size is normal.  Left ventricular systolic function is severely reduced.  LV ejection fraction = 20-25%.  Left ventricular filling pattern is prolonged  relaxation.  Abnormal (paradoxical) septal motion consistent with LBBB.  Unable to fully assess LV regional wall motion  The right ventricle is normal size.  The right ventricular systolic function is mildly reduced.  There is mild mitral regurgitation.  No significant stenosis seen  There was insufficient TR detected to calculate RV systolic pressure.  Estimated right atrial pressure is 5 mmHg.Marland Kitchen  There is no pericardial effusion.  There is no comparison study available.   Assessment and Plan:  1.  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  Most recent LVEF 20 to 25% range by assessment at [facility].  We went over his medications in detail today, plan to reinitiate Coreg 6.25 mg twice daily, continue Entresto 24/26 mg twice daily, and reinitiate Lasix at 40 mg daily.  He will need a follow-up BMET at next visit within a month.  I reviewed his lab work from May at which point renal function and potassium were normal.  Can decide if he needs potassium supplement and/or addition  of Aldactone as well.  2.  Paroxysmal to persistent atrial fibrillation with CHA2DS2-VASc or of at least 3.  He is in sinus rhythm today but does have increased AF burden despite use of amiodarone as discussed in recent notes and based on device interrogation.  He will continue Coumadin with follow-up in anticoagulation clinic as before.  I have recommended that he stop amiodarone altogether as has also been recommended previously given concerns about potential pulmonary toxicity and the fact that it has not been overly effective in rhythm suppression.  Reinitiating beta-blocker as discussed above, can consider addition of Lanoxin next if necessary.  3.  Chronic hypoxic respiratory failure, had COVID-19 pneumonia in April.  He has supplemental oxygen which he is using at nighttime only at this point.  Oxygen saturation normal on room air today.  4.  St[employer] Jude pacemaker in place with follow-up by [name].  Medication Adjustments/Labs and Tests Ordered: Current medicines are reviewed at length with the patient today.  Concerns regarding medicines are outlined above.   Tests Ordered: Orders Placed This Encounter  Procedures  . Basic metabolic panel  . EKG 12-Lead    Medication Changes: Meds ordered this encounter  Medications  . carvedilol (COREG) 6.25 MG tablet    Sig: Take 1 tablet (6.25 mg total) by mouth 2 (two) times daily.    Dispense:  180 tablet    Refill:  3    Stop metoprolol  . furosemide (LASIX) 40 MG tablet    Sig: Take 1 tablet (40 mg total) by mouth daily.    Dispense:  90 tablet    Refill:  2    RESTART  . sacubitril-valsartan (ENTRESTO) 24-26 MG    Sig: Take 1 tablet by mouth 2 (two) times daily.    Dispense:  180 tablet    Refill:  2    06/17/2020 change in directions    Disposition:  Follow up 1 month in the [place] office.  Signed, Jonelle Sidle, MD, [facility] 06/17/2020 10:57 AM    [facility] at [facility] [address] [place], [place], [place]  [address] Phone: [phone]; Fax: [phone]

## 2020-06-17 ENCOUNTER — Ambulatory Visit (INDEPENDENT_AMBULATORY_CARE_PROVIDER_SITE_OTHER): Payer: Medicare HMO | Admitting: Cardiology

## 2020-06-17 ENCOUNTER — Encounter: Payer: Self-pay | Admitting: Cardiology

## 2020-06-17 ENCOUNTER — Ambulatory Visit (INDEPENDENT_AMBULATORY_CARE_PROVIDER_SITE_OTHER): Payer: Medicare HMO | Admitting: *Deleted

## 2020-06-17 VITALS — BP 126/68 | HR 60 | Ht 70.0 in | Wt 227.0 lb

## 2020-06-17 DIAGNOSIS — I429 Cardiomyopathy, unspecified: Secondary | ICD-10-CM | POA: Diagnosis not present

## 2020-06-17 DIAGNOSIS — Z5181 Encounter for therapeutic drug level monitoring: Secondary | ICD-10-CM | POA: Diagnosis not present

## 2020-06-17 DIAGNOSIS — I4819 Other persistent atrial fibrillation: Secondary | ICD-10-CM

## 2020-06-17 DIAGNOSIS — I480 Paroxysmal atrial fibrillation: Secondary | ICD-10-CM | POA: Diagnosis not present

## 2020-06-17 DIAGNOSIS — Z950 Presence of cardiac pacemaker: Secondary | ICD-10-CM | POA: Diagnosis not present

## 2020-06-17 LAB — CUP PACEART REMOTE DEVICE CHECK
Battery Remaining Longevity: 108 mo
Battery Remaining Percentage: 95.5 %
Battery Voltage: 2.99 V
Brady Statistic AP VP Percent: 1 %
Brady Statistic AP VS Percent: 58 %
Brady Statistic AS VP Percent: 1 %
Brady Statistic AS VS Percent: 41 %
Brady Statistic RA Percent Paced: 40 %
Brady Statistic RV Percent Paced: 1.6 %
Date Time Interrogation Session: 20210719020023
Implantable Lead Implant Date: 20001120
Implantable Lead Implant Date: 20001120
Implantable Lead Location: 753859
Implantable Lead Location: 753860
Implantable Pulse Generator Implant Date: 20171220
Lead Channel Impedance Value: 240 Ohm
Lead Channel Impedance Value: 430 Ohm
Lead Channel Pacing Threshold Amplitude: 0.5 V
Lead Channel Pacing Threshold Amplitude: 1 V
Lead Channel Pacing Threshold Pulse Width: 0.5 ms
Lead Channel Pacing Threshold Pulse Width: 0.5 ms
Lead Channel Sensing Intrinsic Amplitude: 5 mV
Lead Channel Sensing Intrinsic Amplitude: 8.8 mV
Lead Channel Setting Pacing Amplitude: 1.5 V
Lead Channel Setting Pacing Amplitude: 2.5 V
Lead Channel Setting Pacing Pulse Width: 0.5 ms
Lead Channel Setting Sensing Sensitivity: 2 mV
Pulse Gen Model: 2272
Pulse Gen Serial Number: 7983565

## 2020-06-17 LAB — POCT INR: INR: 1.8 — AB (ref 2.0–3.0)

## 2020-06-17 MED ORDER — FUROSEMIDE 40 MG PO TABS: 40.0000 mg | ORAL_TABLET | Freq: Every day | ORAL | 2 refills | Status: DC

## 2020-06-17 MED ORDER — ENTRESTO 24-26 MG PO TABS: 1.0000 | ORAL_TABLET | Freq: Two times a day (BID) | ORAL | 2 refills | Status: DC

## 2020-06-17 MED ORDER — CARVEDILOL 6.25 MG PO TABS: 6.2500 mg | ORAL_TABLET | Freq: Two times a day (BID) | ORAL | 3 refills | Status: DC

## 2020-06-17 NOTE — Progress Notes (Signed)
Remote pacemaker transmission.   

## 2020-06-17 NOTE — Patient Instructions (Signed)
Take warfarin 2 tablets tonight then resume 1 tablet daily except 1 1/2 tablets on Sundays and Thursdays Recheck in 3 wks

## 2020-06-17 NOTE — Patient Instructions (Addendum)
Medication Instructions:    Your physician has recommended you make the following change in your medication:   Stop metoprolol  Stop amiodarone  Please take your furosemide 40 mg daily  Please take carvedilol 6.125 mg by mouth twice daily  Please take entresto 24/26 mg by mouth twice daily  Continue other listed medications the same  Labwork:  Your physician recommends that you return for lab work in: 1 month just before your next visit to check your BMET. This may be done at [facility]. No appointment is needed.  Testing/Procedures:  NONE  Follow-Up:  Your physician recommends that you schedule a follow-up appointment in: 1 month (office).  Any Other Special Instructions Will Be Listed Below (If Applicable). Your physician recommends that you weigh, daily, at the same time every day, and in the same amount of clothing. Please record your daily weights on the handout provided and bring it to your next appointment.  If you need a refill on your cardiac medications before your next appointment, please call your pharmacy.

## 2020-06-30 LAB — CUP PACEART INCLINIC DEVICE CHECK
Date Time Interrogation Session: 20210503165506
Implantable Lead Implant Date: 20001120
Implantable Lead Implant Date: 20001120
Implantable Lead Location: 753859
Implantable Lead Location: 753860
Implantable Pulse Generator Implant Date: 20171220
Lead Channel Pacing Threshold Amplitude: 0.5 V
Lead Channel Pacing Threshold Amplitude: 1 V
Lead Channel Pacing Threshold Pulse Width: 0.5 ms
Lead Channel Pacing Threshold Pulse Width: 0.5 ms
Lead Channel Sensing Intrinsic Amplitude: 5 mV
Lead Channel Sensing Intrinsic Amplitude: 8.4 mV
Pulse Gen Model: 2272
Pulse Gen Serial Number: 7983565

## 2020-07-08 ENCOUNTER — Other Ambulatory Visit: Payer: Self-pay | Admitting: Cardiology

## 2020-07-14 NOTE — Progress Notes (Deleted)
Cardiology Office Note  Date: 07/14/2020   ID: [name], DOB 1941-07-31, MRN [id number]  PCP:  Suzan Slick, MD  Cardiologist:  [name], MD Electrophysiologist:  [name], MD   Chief Complaint: Persistent atrial fibrillation  History of Present Illness: [name] is a 79 y.o. male with a history of persistent atrial fibrillation.  Last seen by [name] on 06/17/2020.  Patient agreed to come off of amiodarone due to concern over potential pulmonary toxicity.  He was asked to continue Entresto and Coumadin and start back on Coreg as well as use his Lasix on a regular basis.  [name] mentioned he was going to continue to have breakthrough atrial fibrillation and hopefully heart rate control would be adequate with beta-blocker alone.  If not, may consider an adding Lanoxin next.  [name] reinitiated Coreg 6.25 mg p.o. twice daily, Entresto 24/26 mg p.o. twice daily and Lasix 40 mg daily.  BMP at follow-up.  [name] mentioned can decide if he needs potassium supplement and/or addition of Aldactone as well.  Past Medical History:  Diagnosis Date  . Anginal pain (HCC)   . Asthma   . Bronchitis   . 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 History of epididymitis   . History of recurrent UTIs   . History of ruptured appendix   . Paroxysmal atrial fibrillation (HCC)   . Peptic ulcer disease   . Pneumonia due to COVID-19 virus   . Presence of permanent cardiac pacemaker   . Tachy-brady syndrome (HCC)    PPM (SJM) implanted by Dr Graciela Husbands 10/19/99, gen change 04/03/08    Past Surgical History:  Procedure Laterality Date  . EP IMPLANTABLE DEVICE [name] 11/17/2016   Procedure: PPM Generator Changeout;  Surgeon: Marinus Maw, MD;  Location: [facility] INVASIVE CV LAB;  Service: Cardiovascular;  Laterality: [name];  . INSERT / REPLACE / REMOVE PACEMAKER    . PACEMAKER PLACEMENT  03/2008   [employer], [place]. 1287    Current Outpatient  Medications  Medication Sig Dispense Refill  . acetaminophen (TYLENOL) 325 MG tablet Take 2 tablets (650 mg total) by mouth every 6 (six) hours as needed for mild pain, fever or headache (T over 101).    . carvedilol (COREG) 6.25 MG tablet Take 1 tablet (6.25 mg total) by mouth 2 (two) times daily. 180 tablet 3  . furosemide (LASIX) 40 MG tablet Take 1 tablet (40 mg total) by mouth daily. 90 tablet 2  . Homeopathic Products (ALLERGY MEDICINE PO) Take 1 tablet by mouth as needed (allergies).     . sacubitril-valsartan (ENTRESTO) 24-26 MG Take 1 tablet by mouth 2 (two) times daily. 180 tablet 2  . warfarin (COUMADIN) 2.5 MG tablet TAKE ONE TABLET BY MOUOTH ONCE DAILY EXCEPT ONE AND ONE-HALF TABLETS ONCE ON SUNDAYS AND THURSDAYS 45 tablet 3   No current facility-administered medications for this visit.   Allergies:  Bee venom, Corn-containing products, Codeine, Invert sugar, Rice, and Vanilla   Social History: The patient  reports that he quit smoking about 42 years ago. His smoking use included cigarettes. He started smoking about 60 years ago. He has a 30.00 pack-year smoking history. He has never used smokeless tobacco. He reports that he does not drink alcohol and does not use drugs.   Family History: The patient's family history includes Cancer in his father; Suicidality in his mother.   ROS:  Please see the history of present illness.  Otherwise, complete review of systems is positive for {NONE DEFAULTED:18576::"none"}.  All other systems are reviewed and negative.   Physical Exam: VS:  There were no vitals taken for this visit., BMI There is no height or weight on file to calculate BMI.  Wt Readings from Last 3 Encounters:  06/17/20 227 lb (103 kg)  06/04/20 223 lb (101.2 kg)  04/15/20 232 lb 3.2 oz (105.3 kg)    General: Patient appears comfortable at rest. HEENT: Conjunctiva and lids normal, oropharynx clear with moist mucosa. Neck: Supple, no elevated JVP or carotid bruits, no  thyromegaly. Lungs: Clear to auscultation, nonlabored breathing at rest. Cardiac: Regular rate and rhythm, no S3 or significant systolic murmur, no pericardial rub. Abdomen: Soft, nontender, no hepatomegaly, bowel sounds present, no guarding or rebound. Extremities: No pitting edema, distal pulses 2+. Skin: Warm and dry. Musculoskeletal: No kyphosis. Neuropsychiatric: Alert and oriented x3, affect grossly appropriate.  ECG:  {EKG/Telemetry Strips [phone]}  Recent Labwork: 03/03/2020: B Natriuretic Peptide 214.6 03/04/2020: Magnesium 2.0; TSH 2.093 03/08/2020: ALT 16; AST 14; BUN 27; Creatinine, Ser 0.98; Hemoglobin 15.3; Platelets 456; Potassium 4.6; Sodium 135     Component Value Date/Time   CHOL 156 03/04/2020 0249   TRIG 136 03/04/2020 0249   HDL 27 (L) 03/04/2020 0249   CHOLHDL 5.8 03/04/2020 0249   VLDL 27 03/04/2020 0249   LDLCALC 102 (H) 03/04/2020 0249   LDLDIRECT 147.9 08/30/2008 0842    Other Studies Reviewed Today:  Echocardiogram 04/28/2020 [facility]): SUMMARY  The left ventricular size is normal.  Left ventricular systolic function is severely reduced.  LV ejection fraction = 20-25%.  Left ventricular filling pattern is prolonged relaxation.  Abnormal (paradoxical) septal motion consistent with LBBB.  Unable to fully assess LV regional wall motion  The right ventricle is normal size.  The right ventricular systolic function is mildly reduced.  There is mild mitral regurgitation.  No significant stenosis seen  There was insufficient TR detected to calculate RV systolic pressure.  Estimated right atrial pressure is 5 mmHg.Marland Kitchen  There is no pericardial effusion.  There is no comparison study available.   Assessment and Plan:  1. Cardiomyopathy, unspecified type (HCC)   2. PAF (paroxysmal atrial fibrillation) (HCC)   3. Chronic respiratory failure with hypoxia (HCC)   4. Pacemaker    1. 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  2. PAF (paroxysmal atrial fibrillation) (HCC)  CHADS2-VASc 3, Amiodarone d/c'd due to potential pulmonary toxicity  3. Chronic respiratory failure with hypoxia (HCC) Using 02 at home at night. O2 saturation on RA at last visit was normal.   4. Pacemaker   Medication Adjustments/Labs and Tests Ordered: Current medicines are reviewed at length with the patient today.  Concerns regarding medicines are outlined above.   Disposition: Follow-up with   Signed, Rennis Harding, NP 07/14/2020 10:46 PM    [facility] at [facility] [address] [place], [place], [place] [address] Phone: [phone]; Fax: [phone]

## 2020-07-15 ENCOUNTER — Encounter: Payer: Medicare HMO | Admitting: Family Medicine

## 2020-08-05 ENCOUNTER — Encounter: Payer: Self-pay | Admitting: Family Medicine

## 2020-08-05 ENCOUNTER — Encounter: Payer: Medicare HMO | Admitting: Family Medicine

## 2020-08-05 ENCOUNTER — Ambulatory Visit (INDEPENDENT_AMBULATORY_CARE_PROVIDER_SITE_OTHER): Payer: Medicare HMO | Admitting: Pharmacist

## 2020-08-05 DIAGNOSIS — Z5181 Encounter for therapeutic drug level monitoring: Secondary | ICD-10-CM | POA: Diagnosis not present

## 2020-08-05 DIAGNOSIS — I480 Paroxysmal atrial fibrillation: Secondary | ICD-10-CM | POA: Diagnosis not present

## 2020-08-05 LAB — POCT INR: INR: 1.2 — AB (ref 2.0–3.0)

## 2020-08-05 NOTE — Patient Instructions (Signed)
Description   Take 2 tablets today and tomorrow, then resume 1 tablet daily except 1.5 tablets on Sundays and Thursdays Recheck in 1 week

## 2020-08-05 NOTE — Progress Notes (Signed)
Error - Erroneous Encounter 

## 2020-08-06 ENCOUNTER — Other Ambulatory Visit: Payer: Self-pay | Admitting: *Deleted

## 2020-08-06 MED ORDER — ENTRESTO 24-26 MG PO TABS: 1.0000 | ORAL_TABLET | Freq: Two times a day (BID) | ORAL | 2 refills | Status: DC

## 2020-08-11 NOTE — Progress Notes (Signed)
Cardiology Office Note  Date: 08/12/2020   ID: [name], DOB Sep 06, 1941, MRN [id number]  PCP:  Suzan Slick, MD  Cardiologist:  [name], MD Electrophysiologist:  [name], MD   Chief Complaint: Follow-up persistent atrial fibrillation  History of Present Illness: [name] is a 78 y.o. male with a history of persistent atrial fibrillation, secondary cardiomyopathy, pacemaker.  Last encounter with [name] on 06/17/2020.  Patient had a recent follow-up with Dr. Graciela Husbands in device clinic in early July following hospitalization at [facility].  Echo demonstrated LVEF 20 to 25% with diffuse hypokinesis in setting of rapid atrial fibrillation.  He was treated with IV diuresis, IV amiodarone and Coumadin.  Chest CT suggested UIP and concern about potential for amiodarone toxicity.  He was treated with carvedilol and Entresto.  Dr. Graciela Husbands recommended stopping amiodarone and adding Lanoxin given concerns about potential amiodarone toxicity.  Also switching from Entresto to losartan to allow more blood pressure room for up titration of AV nodal blockers given increased AF burden.  Patient declined any medication changes at that point  At visit at with [name] he had been taking his Entresto regularly along with Coumadin and amiodarone.  He had bottles of both Coreg and metoprolol.  He was taking neither.  Also not taking his Lasix regularly and weight had been increasing.  Daughter had a long talk with patient about reasonable medication choices.  He agreed to stop amiodarone.  He was going to continue Entresto and Coumadin and start back on Coreg and use Lasix as needed.  [name] mentioned patient will continue to have breakthrough atrial fibrillation but hopefully heart rate control would be adequate with beta-blocker alone.  If this were not adequate could consider adding Lanoxin  Patient is here for 1 month follow-up.  He denies any recent issues.  No anginal or  exertional symptoms, palpitations or arrhythmias, orthostatic symptoms, CVA or TIA-like symptoms, bleeding issues, PND, orthopnea, lightheadedness, dizziness, presyncope or syncopal episodes.  No claudication-like symptoms, DVT or PE-like symptoms, lower extremity edema.Recent lab work ordered by [name] at last visit showed sodium 142, potassium 4.6, creatinine 1.05, GFR 69, glucose 109, calcium 9.7.  Patient states he is feeling well and is compliant with all of his medications.  He continues on his cardiac regimen for systolic heart failure of carvedilol 6.25 mg p.o. twice daily, Entresto 24/26 mg p.o. twice daily, Lasix 40 mg p.o. daily.  Continues warfarin 2.5 mg daily except for 1-1/2 tablets on Sundays and Thursdays.  He is scheduled for INR check today after our appointment.  EKG today shows normal sinus rhythm with a rate of 67, possible LAE, anteroseptal infarct, age undetermined.   Past Medical History:  Diagnosis Date  . Anginal pain (HCC)   . Asthma   . Bronchitis   . 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 History of epididymitis   . History of recurrent UTIs   . History of ruptured appendix   . Paroxysmal atrial fibrillation (HCC)   . Peptic ulcer disease   . Pneumonia due to COVID-19 virus   . Presence of permanent cardiac pacemaker   . Tachy-brady syndrome (HCC)    PPM (SJM) implanted by Dr Graciela Husbands 10/19/99, gen change 04/03/08    Past Surgical History:  Procedure Laterality Date  . EP IMPLANTABLE DEVICE [name] 11/17/2016   Procedure: PPM Generator Changeout;  Surgeon: Cameron Maw, MD;  Location: [facility] INVASIVE CV LAB;  Service:  Cardiovascular;  Laterality: [name];  . INSERT / REPLACE / REMOVE PACEMAKER    . PACEMAKER PLACEMENT  03/2008   [facility], [place]. [address]    Current Outpatient Medications  Medication Sig Dispense Refill  . acetaminophen (TYLENOL) 325 MG tablet Take 2 tablets (650 mg total) by mouth every 6 (six) hours as needed for mild pain, fever  or headache (T over 101).    . carvedilol (COREG) 6.25 MG tablet Take 1 tablet (6.25 mg total) by mouth 2 (two) times daily. 180 tablet 3  . furosemide (LASIX) 40 MG tablet Take 1 tablet (40 mg total) by mouth daily. 90 tablet 2  . Homeopathic Products (ALLERGY MEDICINE PO) Take 1 tablet by mouth as needed (allergies).     . sacubitril-valsartan (ENTRESTO) 24-26 MG Take 1 tablet by mouth 2 (two) times daily. 180 tablet 2  . warfarin (COUMADIN) 2.5 MG tablet TAKE ONE TABLET BY MOUOTH ONCE DAILY EXCEPT ONE AND ONE-HALF TABLETS ONCE ON SUNDAYS AND THURSDAYS 45 tablet 3   No current facility-administered medications for this visit.   Allergies:  Bee venom, Corn-containing products, Codeine, Invert sugar, Rice, and Vanilla   Social History: The patient  reports that he quit smoking about 42 years ago. His smoking use included cigarettes. He started smoking about 61 years ago. He has a 30.00 pack-year smoking history. He has never used smokeless tobacco. He reports that he does not drink alcohol and does not use drugs.   Family History: The patient's family history includes Cancer in his father; Suicidality in his mother.   ROS:  Please see the history of present illness. Otherwise, complete review of systems is positive for none.  All other systems are reviewed and negative.   Physical Exam: VS:  BP 104/64   Pulse 64   Ht 5\' 10"  (1.778 m)   Wt 220 lb (99.8 kg)   SpO2 98%   BMI 31.57 kg/m , BMI Body mass index is 31.57 kg/m.  Wt Readings from Last 3 Encounters:  08/12/20 220 lb (99.8 kg)  08/05/20 222 lb 6.4 oz (100.9 kg)  06/17/20 227 lb (103 kg)    General: Patient appears comfortable at rest. Neck: Supple, no elevated JVP or carotid bruits, no thyromegaly. Lungs: Clear to auscultation, nonlabored breathing at rest. Cardiac: Regular rate and rhythm, no S3 or significant systolic murmur, no pericardial rub. Extremities: No pitting edema, distal pulses 2+. Skin: Warm and  dry. Musculoskeletal: No kyphosis. Neuropsychiatric: Alert and oriented x3, affect grossly appropriate.  ECG:  An ECG dated August 12, 2020 was personally reviewed today and demonstrated:  Normal sinus rhythm rate of 67, possible left atrial enlargement, anteroseptal infarct, age undetermined.  Recent Labwork: 03/03/2020: B Natriuretic Peptide 214.6 03/04/2020: Magnesium 2.0; TSH 2.093 03/08/2020: ALT 16; AST 14; BUN 27; Creatinine, Ser 0.98; Hemoglobin 15.3; Platelets 456; Potassium 4.6; Sodium 135     Component Value Date/Time   CHOL 156 03/04/2020 0249   TRIG 136 03/04/2020 0249   HDL 27 (L) 03/04/2020 0249   CHOLHDL 5.8 03/04/2020 0249   VLDL 27 03/04/2020 0249   LDLCALC 102 (H) 03/04/2020 0249   LDLDIRECT 147.9 08/30/2008 0842    Other Studies Reviewed Today:  Echocardiogram 04/28/2020 [facility]): SUMMARY  The left ventricular size is normal.  Left ventricular systolic function is severely reduced.  LV ejection fraction = 20-25%.  Left ventricular filling pattern is prolonged relaxation.  Abnormal (paradoxical) septal motion consistent with LBBB.  Unable to fully assess LV regional wall motion  The right ventricle is normal size.  The right ventricular systolic function is mildly reduced.  There is mild mitral regurgitation.  No significant stenosis seen  There was insufficient TR detected to calculate RV systolic pressure.  Estimated right atrial pressure is 5 mmHg.Marland Kitchen  There is no pericardial effusion.  There is no comparison study available.   Assessment and Plan:  1. Cardiomyopathy, unspecified type (HCC)   2. PAF (paroxysmal atrial fibrillation) (HCC)   3. Chronic respiratory failure with hypoxia (HCC)   4. Cardiac pacemaker in situ    1. Cardiomyopathy, unspecified type [facility]) Echocardiogram at [facility] on 04/28/2020 showed LVEF of 20 to 25%.  LV filling pattern prolonged relaxation, abnormal/paradoxical septal motion consistent with LBBB.   Continue carvedilol 6.25 mg p.o. twice daily, Entresto 24/26 mg p.o. twice daily.  Lasix 40 mg p.o. daily.  Please get a repeat echocardiogram 1 month prior to 65-month follow-up.  2. PAF (paroxysmal atrial fibrillation) (HCC) EKG today shows normal sinus rhythm with a rate of 67, possible left atrial enlargement, anteroseptal infarct, age undetermined.  Continue carvedilol 6.25 mg p.o. twice daily.  Continue Coumadin 2.5 mg daily except for 1-1/2 tablets on Sundays and Thursdays.  Follow with Coumadin clinic for INR checks.  3. Chronic respiratory failure with hypoxia (HCC) Patient denies any significant dyspnea on exertion.  He does use oxygen as needed at home.  O2 saturations on room air today 98%.  He is in no acute respiratory distress.   4. Cardiac pacemaker in situ [facility] Jude pacemaker in place initially placed in 2009 with generator change in 2017.  Recent remote device check on 06/16/2020 by Dr. Graciela Husbands.  Battery status is good, lead measurements unchanged, histograms were appropriate.  Medication Adjustments/Labs and Tests Ordered: Current medicines are reviewed at length with the patient today.  Concerns regarding medicines are outlined above.   Disposition: Follow-up with [name] or APP 6 months.  Signed, Rennis Harding, NP 08/12/2020 11:20 AM    [facility] at [facility] [address], [place], [place] [address] Phone: [phone]; Fax: [phone]

## 2020-08-12 ENCOUNTER — Other Ambulatory Visit: Payer: Self-pay

## 2020-08-12 ENCOUNTER — Encounter: Payer: Self-pay | Admitting: Family Medicine

## 2020-08-12 ENCOUNTER — Ambulatory Visit (INDEPENDENT_AMBULATORY_CARE_PROVIDER_SITE_OTHER): Payer: Medicare HMO | Admitting: Family Medicine

## 2020-08-12 ENCOUNTER — Ambulatory Visit (INDEPENDENT_AMBULATORY_CARE_PROVIDER_SITE_OTHER): Payer: Medicare HMO | Admitting: *Deleted

## 2020-08-12 VITALS — BP 104/64 | HR 64 | Ht 70.0 in | Wt 220.0 lb

## 2020-08-12 DIAGNOSIS — Z5181 Encounter for therapeutic drug level monitoring: Secondary | ICD-10-CM

## 2020-08-12 DIAGNOSIS — I480 Paroxysmal atrial fibrillation: Secondary | ICD-10-CM

## 2020-08-12 DIAGNOSIS — Z23 Encounter for immunization: Secondary | ICD-10-CM

## 2020-08-12 DIAGNOSIS — J9611 Chronic respiratory failure with hypoxia: Secondary | ICD-10-CM | POA: Diagnosis not present

## 2020-08-12 DIAGNOSIS — I429 Cardiomyopathy, unspecified: Secondary | ICD-10-CM

## 2020-08-12 DIAGNOSIS — Z950 Presence of cardiac pacemaker: Secondary | ICD-10-CM

## 2020-08-12 LAB — POCT INR: INR: 1.2 — AB (ref 2.0–3.0)

## 2020-08-12 NOTE — Patient Instructions (Signed)
Medication Instructions:  Continue all current medications.  Labwork: none  Testing/Procedures: Your physician has requested that you have an echocardiogram. Echocardiography is a painless test that uses sound waves to create images of your heart. It provides your doctor with information about the size and shape of your heart and how well your heart's chambers and valves are working. This procedure takes approximately one hour. There are no restrictions for this procedure - DUE JUST PRIOR TO NEXT OFFICE VISIT   Follow-Up: 6 months   Any Other Special Instructions Will Be Listed Below (If Applicable).  If you need a refill on your cardiac medications before your next appointment, please call your pharmacy.  

## 2020-08-12 NOTE — Patient Instructions (Signed)
Take 2 tablets today and tomorrow, then increase dose to 1.5 tablets daily except 1 tablet on Mondays, Wednesdays and Fridays Recheck in 10 days

## 2020-08-21 ENCOUNTER — Ambulatory Visit (INDEPENDENT_AMBULATORY_CARE_PROVIDER_SITE_OTHER): Payer: Medicare HMO | Admitting: *Deleted

## 2020-08-21 DIAGNOSIS — I480 Paroxysmal atrial fibrillation: Secondary | ICD-10-CM | POA: Diagnosis not present

## 2020-08-21 DIAGNOSIS — Z5181 Encounter for therapeutic drug level monitoring: Secondary | ICD-10-CM | POA: Diagnosis not present

## 2020-08-21 LAB — POCT INR: INR: 1.6 — AB (ref 2.0–3.0)

## 2020-08-21 NOTE — Patient Instructions (Signed)
Take 2 tablets today then increase dose to 1 1/2 tablets daily except 1 tablet on Mondays and Thursdays Recheck in 10 days

## 2020-09-01 ENCOUNTER — Ambulatory Visit (INDEPENDENT_AMBULATORY_CARE_PROVIDER_SITE_OTHER): Payer: Medicare HMO | Admitting: *Deleted

## 2020-09-01 DIAGNOSIS — Z5181 Encounter for therapeutic drug level monitoring: Secondary | ICD-10-CM

## 2020-09-01 DIAGNOSIS — I480 Paroxysmal atrial fibrillation: Secondary | ICD-10-CM

## 2020-09-01 LAB — POCT INR: INR: 2.2 (ref 2.0–3.0)

## 2020-09-01 NOTE — Patient Instructions (Signed)
Continue warfarin 1 1/2 tablets daily except 1 tablet on Mondays and Thursdays Recheck in 3 wks 

## 2020-09-08 ENCOUNTER — Telehealth: Payer: Self-pay | Admitting: Cardiology

## 2020-09-08 MED ORDER — ENTRESTO 24-26 MG PO TABS: 1.0000 | ORAL_TABLET | Freq: Two times a day (BID) | ORAL | 0 refills | Status: DC

## 2020-09-08 NOTE — Telephone Encounter (Signed)
Reports entresto normally cost him $47/mth and price increased this month. Advised that samples would be provided until patient assistance paperwork can be filled out and faxed to [employer]. Advised to fill out patient sections and bring back with proof of income and prescription out of pocket expense for 2021. Verbalized understanding of plan.

## 2020-09-08 NOTE — Telephone Encounter (Signed)
Pt cannot afford his Rx for his sacubitril-valsartan (ENTRESTO) 24-26 MG [[id number]]  This month. It's $115   Please call [phone]

## 2020-09-15 ENCOUNTER — Ambulatory Visit (INDEPENDENT_AMBULATORY_CARE_PROVIDER_SITE_OTHER): Payer: Medicare HMO

## 2020-09-15 DIAGNOSIS — I4891 Unspecified atrial fibrillation: Secondary | ICD-10-CM | POA: Diagnosis not present

## 2020-09-16 LAB — CUP PACEART REMOTE DEVICE CHECK
Battery Remaining Longevity: 108 mo
Battery Remaining Percentage: 95.5 %
Battery Voltage: 2.99 V
Brady Statistic AP VP Percent: 1 %
Brady Statistic AP VS Percent: 63 %
Brady Statistic AS VP Percent: 1 %
Brady Statistic AS VS Percent: 37 %
Brady Statistic RA Percent Paced: 44 %
Brady Statistic RV Percent Paced: 1.9 %
Date Time Interrogation Session: 20211018020018
Implantable Lead Implant Date: 20001120
Implantable Lead Implant Date: 20001120
Implantable Lead Location: 753859
Implantable Lead Location: 753860
Implantable Pulse Generator Implant Date: 20171220
Lead Channel Impedance Value: 240 Ohm
Lead Channel Impedance Value: 430 Ohm
Lead Channel Pacing Threshold Amplitude: 0.5 V
Lead Channel Pacing Threshold Amplitude: 1 V
Lead Channel Pacing Threshold Pulse Width: 0.5 ms
Lead Channel Pacing Threshold Pulse Width: 0.5 ms
Lead Channel Sensing Intrinsic Amplitude: 5 mV
Lead Channel Sensing Intrinsic Amplitude: 8.2 mV
Lead Channel Setting Pacing Amplitude: 1.5 V
Lead Channel Setting Pacing Amplitude: 2.5 V
Lead Channel Setting Pacing Pulse Width: 0.5 ms
Lead Channel Setting Sensing Sensitivity: 2 mV
Pulse Gen Model: 2272
Pulse Gen Serial Number: 7983565

## 2020-09-19 ENCOUNTER — Other Ambulatory Visit: Payer: Self-pay | Admitting: *Deleted

## 2020-09-19 MED ORDER — ENTRESTO 24-26 MG PO TABS: 1.0000 | ORAL_TABLET | Freq: Two times a day (BID) | ORAL | 0 refills | Status: DC

## 2020-09-22 ENCOUNTER — Ambulatory Visit (INDEPENDENT_AMBULATORY_CARE_PROVIDER_SITE_OTHER): Payer: Medicare HMO | Admitting: *Deleted

## 2020-09-22 DIAGNOSIS — I480 Paroxysmal atrial fibrillation: Secondary | ICD-10-CM

## 2020-09-22 DIAGNOSIS — Z5181 Encounter for therapeutic drug level monitoring: Secondary | ICD-10-CM | POA: Diagnosis not present

## 2020-09-22 LAB — POCT INR: INR: 1.8 — AB (ref 2.0–3.0)

## 2020-09-22 NOTE — Progress Notes (Signed)
Remote pacemaker transmission.   

## 2020-09-22 NOTE — Patient Instructions (Signed)
Take 1 1/2 tablets tonight then resume 1 1/2 tablets daily except 1 tablet on Mondays and Thursdays Recheck in 3 wks

## 2020-10-03 ENCOUNTER — Other Ambulatory Visit: Payer: Self-pay | Admitting: *Deleted

## 2020-10-03 MED ORDER — ENTRESTO 24-26 MG PO TABS: 1.0000 | ORAL_TABLET | Freq: Two times a day (BID) | ORAL | 0 refills | Status: DC

## 2020-10-13 ENCOUNTER — Ambulatory Visit (INDEPENDENT_AMBULATORY_CARE_PROVIDER_SITE_OTHER): Payer: Medicare HMO | Admitting: *Deleted

## 2020-10-13 DIAGNOSIS — Z5181 Encounter for therapeutic drug level monitoring: Secondary | ICD-10-CM

## 2020-10-13 DIAGNOSIS — I480 Paroxysmal atrial fibrillation: Secondary | ICD-10-CM | POA: Diagnosis not present

## 2020-10-13 LAB — POCT INR: INR: 2.3 (ref 2.0–3.0)

## 2020-10-13 NOTE — Patient Instructions (Signed)
Continue warfarin 1 1/2 tablets daily except 1 tablet on Mondays and Thursdays Recheck in 4 wks 

## 2020-11-17 ENCOUNTER — Telehealth: Payer: Self-pay | Admitting: Cardiology

## 2020-11-17 MED ORDER — ENTRESTO 24-26 MG PO TABS: 1.0000 | ORAL_TABLET | Freq: Two times a day (BID) | ORAL | 0 refills | Status: DC

## 2020-11-17 NOTE — Telephone Encounter (Signed)
Advised that entresto samples are available for pick up.

## 2020-11-17 NOTE — Telephone Encounter (Signed)
*  STAT* If patient is at the pharmacy, call can be transferred to refill team.   1. Which medications need to be refilled? (please list name of each medication and dose if known)  sacubitril-valsartan (ENTRESTO) 24-26 MG [[id number]]    2. Which pharmacy/location (including street and city if local pharmacy) is medication to be sent to? [facility]  3. Do they need a 30 day or 90 day supply?  Requested a 12 day supply- just enough to last to the first of the year   Been out of this medication since Saturday.

## 2020-11-19 ENCOUNTER — Ambulatory Visit (INDEPENDENT_AMBULATORY_CARE_PROVIDER_SITE_OTHER): Payer: Medicare HMO | Admitting: *Deleted

## 2020-11-19 DIAGNOSIS — Z5181 Encounter for therapeutic drug level monitoring: Secondary | ICD-10-CM | POA: Diagnosis not present

## 2020-11-19 DIAGNOSIS — I480 Paroxysmal atrial fibrillation: Secondary | ICD-10-CM

## 2020-11-19 LAB — POCT INR: INR: 1.4 — AB (ref 2.0–3.0)

## 2020-11-19 NOTE — Patient Instructions (Signed)
Take warfarin 2 tablets today and tomorrow then resume 1 1/2 tablets daily except 1 tablet on Mondays and Thursdays Recheck in 2 wks

## 2020-12-01 ENCOUNTER — Other Ambulatory Visit: Payer: Self-pay | Admitting: Cardiology

## 2020-12-04 ENCOUNTER — Ambulatory Visit (INDEPENDENT_AMBULATORY_CARE_PROVIDER_SITE_OTHER): Payer: Medicare Other | Admitting: *Deleted

## 2020-12-04 DIAGNOSIS — Z5181 Encounter for therapeutic drug level monitoring: Secondary | ICD-10-CM | POA: Diagnosis not present

## 2020-12-04 DIAGNOSIS — I480 Paroxysmal atrial fibrillation: Secondary | ICD-10-CM | POA: Diagnosis not present

## 2020-12-04 LAB — POCT INR: INR: 2 (ref 2.0–3.0)

## 2020-12-04 NOTE — Patient Instructions (Signed)
Continue warfarin 1 1/2 tablets daily except 1 tablet on Mondays and Thursdays Recheck in 3 wks

## 2020-12-15 ENCOUNTER — Ambulatory Visit (INDEPENDENT_AMBULATORY_CARE_PROVIDER_SITE_OTHER): Payer: Medicare Other

## 2020-12-15 DIAGNOSIS — I480 Paroxysmal atrial fibrillation: Secondary | ICD-10-CM

## 2020-12-17 ENCOUNTER — Telehealth: Payer: Self-pay | Admitting: *Deleted

## 2020-12-17 ENCOUNTER — Telehealth: Payer: Self-pay

## 2020-12-17 LAB — CUP PACEART REMOTE DEVICE CHECK
Battery Remaining Longevity: 107 mo
Battery Remaining Percentage: 95.5 %
Battery Voltage: 2.99 V
Brady Statistic AP VP Percent: 1 %
Brady Statistic AP VS Percent: 65 %
Brady Statistic AS VP Percent: 1 %
Brady Statistic AS VS Percent: 35 %
Brady Statistic RA Percent Paced: 41 %
Brady Statistic RV Percent Paced: 2.6 %
Date Time Interrogation Session: 20220117024025
Implantable Lead Implant Date: 20001120
Implantable Lead Implant Date: 20001120
Implantable Lead Location: 753859
Implantable Lead Location: 753860
Implantable Pulse Generator Implant Date: 20171220
Lead Channel Impedance Value: 240 Ohm
Lead Channel Impedance Value: 400 Ohm
Lead Channel Pacing Threshold Amplitude: 0.5 V
Lead Channel Pacing Threshold Amplitude: 1 V
Lead Channel Pacing Threshold Pulse Width: 0.5 ms
Lead Channel Pacing Threshold Pulse Width: 0.5 ms
Lead Channel Sensing Intrinsic Amplitude: 5 mV
Lead Channel Sensing Intrinsic Amplitude: 7.9 mV
Lead Channel Setting Pacing Amplitude: 1.5 V
Lead Channel Setting Pacing Amplitude: 2.5 V
Lead Channel Setting Pacing Pulse Width: 0.5 ms
Lead Channel Setting Sensing Sensitivity: 2 mV
Pulse Gen Model: 2272
Pulse Gen Serial Number: 7983565

## 2020-12-17 MED ORDER — ENTRESTO 24-26 MG PO TABS: 1.0000 | ORAL_TABLET | Freq: Two times a day (BID) | ORAL | 6 refills | Status: DC

## 2020-12-17 NOTE — Telephone Encounter (Signed)
Merlin transmission received- 51 new VHR events; longest 1 hour 46 minutes w/ rates 160's bpm; EGMs suggest AF w/ RVR AF burden 36%; EGMs suggest A. Fib - ongoing; overall V rates controlled. History of PAF and on carvedilol and warfarin.   Patient ahs history of persistent AF, on Coumadin and Carvedilol 6.25mg  BID.    Spoke with pt, he has noticed increased fatigue as of lately, no other cardiac symptoms noted.    Pt confirmed compliance with medications as ordered.  Although notes that he will be out of Entresto within next couple of days.   Pt not currently at home, he will send a new transmission from his remote monitor when he gets home to determine if he is still in AF.

## 2020-12-17 NOTE — Telephone Encounter (Signed)
Contacted patient and advised that new rx for entresto has been sent to [facility] Patient says he has new insurance and doesn't know his copay for entresto. Advised to contact our office with co pay price and we could try patient assistance if needed. Advised that no samples available at this time and he may have to purchase a few pills in the meantime. Verbalized understanding.

## 2020-12-17 NOTE — Telephone Encounter (Signed)
Per Judy Pimple RN--Kieffer Blatz- I see that you were talking to this patient about Entresto back in October, can you help him with getting a refill, if not please advise if you know who can

## 2020-12-19 ENCOUNTER — Telehealth: Payer: Self-pay | Admitting: Emergency Medicine

## 2020-12-19 NOTE — Telephone Encounter (Signed)
Patient still in AF on 12/17/20 remote with RVR. Patient sent remote transmission and patient is still in AF with v-rate in 120's. Patient reports he has felt tired the past 3-4 days, no CP, chest pressure, or increase in his baseline SOB. Patient reports he did use his home O2 last night to sleep and felt "better", he normally sleeps in a recliner due to chronic back problems. Patient reports he has not been taking his Lasix 40 mg daily. Patient advised to take Lasix 40 mg daily and education done on daily weights. Patient to send transmission 12/22/20 to evaluate AF status. ED precautions given for worsening cardiac condition.

## 2020-12-19 NOTE — Telephone Encounter (Signed)
Per Glory Buff NP increase carvedilol to 12.5 mg BID x 2 days. Take BP before each dose to ensure SBP > 100 before taking medication. Patient education done on changing positions slowly and if he becomes dizzy or lightheaded after taking increased dose of carvedilol he will resume 6.25 mg dose and call the office.patient will send transmission on 12/22/20.

## 2020-12-22 NOTE — Telephone Encounter (Signed)
Transmission received. 2 new VHR events logged, longest 2 hours w/ rates from 160's bpm. EGM suggest AF w/ RVR.   Presenting: AF/VS 120-160's. Attempted to call patient to discuss medication compliance and symptoms. No answer, VM box is not set up. DPR on file (No one). Will attempt at later time.

## 2020-12-25 ENCOUNTER — Ambulatory Visit (INDEPENDENT_AMBULATORY_CARE_PROVIDER_SITE_OTHER): Payer: Medicare Other | Admitting: *Deleted

## 2020-12-25 ENCOUNTER — Other Ambulatory Visit: Payer: Self-pay

## 2020-12-25 DIAGNOSIS — I4891 Unspecified atrial fibrillation: Secondary | ICD-10-CM | POA: Diagnosis not present

## 2020-12-25 DIAGNOSIS — Z5181 Encounter for therapeutic drug level monitoring: Secondary | ICD-10-CM | POA: Diagnosis not present

## 2020-12-25 DIAGNOSIS — I480 Paroxysmal atrial fibrillation: Secondary | ICD-10-CM | POA: Diagnosis not present

## 2020-12-25 LAB — POCT INR: INR: 2.2 (ref 2.0–3.0)

## 2020-12-25 NOTE — Patient Instructions (Signed)
Continue warfarin 1 1/2 tablets daily except 1 tablet on Mondays and Thursdays Recheck in 4 wks

## 2020-12-29 NOTE — Telephone Encounter (Signed)
Please refer to atrial fib clinic to start anti-coagulation. GT

## 2020-12-29 NOTE — Progress Notes (Signed)
Remote pacemaker transmission.   

## 2020-12-30 NOTE — Telephone Encounter (Signed)
Called patient to advise Dr. Ladona Ridgel would like for him to follow up with AF clinic to discuss starting OAC. Advised to call further questions or concerns.

## 2020-12-30 NOTE — Telephone Encounter (Signed)
Called and spoke with patient, he is aware of appt 01/08/21 at 11:30 am with Rudi Coco, NP.  Pt declined sooner appt.

## 2021-01-08 ENCOUNTER — Ambulatory Visit (INDEPENDENT_AMBULATORY_CARE_PROVIDER_SITE_OTHER): Payer: Medicare Other | Admitting: *Deleted

## 2021-01-08 ENCOUNTER — Other Ambulatory Visit: Payer: Self-pay

## 2021-01-08 ENCOUNTER — Ambulatory Visit (HOSPITAL_COMMUNITY)
Admission: RE | Admit: 2021-01-08 | Discharge: 2021-01-08 | Disposition: A | Discharge status: Home / Self Care | Payer: Medicare Other | Source: Ambulatory Visit | Attending: Nurse Practitioner | Admitting: Nurse Practitioner

## 2021-01-08 ENCOUNTER — Encounter (HOSPITAL_COMMUNITY): Payer: Self-pay | Admitting: Nurse Practitioner

## 2021-01-08 VITALS — BP 84/54 | HR 92 | Ht 70.0 in | Wt 224.0 lb

## 2021-01-08 DIAGNOSIS — I4891 Unspecified atrial fibrillation: Secondary | ICD-10-CM | POA: Diagnosis present

## 2021-01-08 DIAGNOSIS — Z950 Presence of cardiac pacemaker: Secondary | ICD-10-CM | POA: Diagnosis not present

## 2021-01-08 DIAGNOSIS — Z7901 Long term (current) use of anticoagulants: Secondary | ICD-10-CM | POA: Diagnosis not present

## 2021-01-08 DIAGNOSIS — D6869 Other thrombophilia: Secondary | ICD-10-CM

## 2021-01-08 DIAGNOSIS — I951 Orthostatic hypotension: Secondary | ICD-10-CM | POA: Diagnosis not present

## 2021-01-08 DIAGNOSIS — Z87891 Personal history of nicotine dependence: Secondary | ICD-10-CM | POA: Insufficient documentation

## 2021-01-08 DIAGNOSIS — I4819 Other persistent atrial fibrillation: Secondary | ICD-10-CM

## 2021-01-08 DIAGNOSIS — I480 Paroxysmal atrial fibrillation: Secondary | ICD-10-CM | POA: Diagnosis not present

## 2021-01-08 DIAGNOSIS — I429 Cardiomyopathy, unspecified: Secondary | ICD-10-CM | POA: Diagnosis not present

## 2021-01-08 DIAGNOSIS — I454 Nonspecific intraventricular block: Secondary | ICD-10-CM | POA: Insufficient documentation

## 2021-01-08 LAB — COMPREHENSIVE METABOLIC PANEL
ALT: 18 U/L (ref 0–44)
AST: 19 U/L (ref 15–41)
Albumin: 3.7 g/dL (ref 3.5–5.0)
Alkaline Phosphatase: 63 U/L (ref 38–126)
Anion gap: 10 (ref 5–15)
BUN: 19 mg/dL (ref 8–23)
CO2: 20 mmol/L — ABNORMAL LOW (ref 22–32)
Calcium: 9.3 mg/dL (ref 8.9–10.3)
Chloride: 110 mmol/L (ref 98–111)
Creatinine, Ser: 1.05 mg/dL (ref 0.61–1.24)
GFR, Estimated: >60 mL/min (ref >60)
Glucose, Bld: 112 mg/dL — ABNORMAL HIGH (ref 70–99)
Potassium: 4.2 mmol/L (ref 3.5–5.1)
Sodium: 140 mmol/L (ref 135–145)
Total Bilirubin: 1.1 mg/dL (ref 0.3–1.2)
Total Protein: 6.5 g/dL (ref 6.5–8.1)

## 2021-01-08 LAB — CBC
HCT: 49.5 % (ref 39.0–52.0)
Hemoglobin: 16.3 g/dL (ref 13.0–17.0)
MCH: 29.4 pg (ref 26.0–34.0)
MCHC: 32.9 g/dL (ref 30.0–36.0)
MCV: 89.2 fL (ref 80.0–100.0)
Platelets: 219 10*3/uL (ref 150–400)
RBC: 5.55 MIL/uL (ref 4.22–5.81)
RDW: 14.1 % (ref 11.5–15.5)
WBC: 8.2 10*3/uL (ref 4.0–10.5)
nRBC: 0 % (ref 0.0–0.2)

## 2021-01-08 LAB — TSH: TSH: 2.395 u[IU]/mL (ref 0.350–4.500)

## 2021-01-08 LAB — PROTIME-INR
INR: 2 — ABNORMAL HIGH (ref 0.8–1.2)
Prothrombin Time: 22.4 seconds — ABNORMAL HIGH (ref 11.4–15.2)

## 2021-01-08 NOTE — Progress Notes (Signed)
Primary Care Physician: Suzan Slick, MD Referring Physician: Dr. Ladona Ridgel [name]   [name] is a 80 y.o. male with a h/o paroxysmal afib, cardiomyopathy, HF  and PPM, on coumadin with a CHA2DS2VASc score of at least 3, in the afib clinic for recent increase in afib.. Last INR 12/25/20, was 2.2. EKG today shows afib at 92 bpm. BP is chronically soft and cannot tolerate any further up titration of BB. He has noted fatigue and some exertional dyspnea for a few weeks. His weight is stable. He is on carvedilol 6.25 mg bid. He was on amiodarone that was discontinued in July 2021 for concerns of pulmonary toxicity on the appointment with Salli Quarry. He was hospitalized last April, when amiodarone was started,  for respiratory failure with Covid.   Today, he denies symptoms of palpitations, chest pain, shortness of breath, orthopnea, PND, lower extremity edema, dizziness, presyncope, syncope, or neurologic sequela. + for exertional dyspnea and fatigue.The patient is tolerating medications without difficulties and is otherwise without complaint today.   Past Medical History:  Diagnosis Date  . Anginal pain (HCC)   . Asthma   . Bronchitis   . 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 History of epididymitis   . History of recurrent UTIs   . History of ruptured appendix   . Paroxysmal atrial fibrillation (HCC)   . Peptic ulcer disease   . Pneumonia due to COVID-19 virus   . Presence of permanent cardiac pacemaker   . Tachy-brady syndrome (HCC)    PPM (SJM) implanted by Dr [name] 10/19/99, gen change 04/03/08   Past Surgical History:  Procedure Laterality Date  . EP IMPLANTABLE DEVICE [name] 11/17/2016   Procedure: PPM Generator Changeout;  Surgeon: Marinus Maw, MD;  Location: [facility] INVASIVE CV LAB;  Service: Cardiovascular;  Laterality: [name];  . INSERT / REPLACE / REMOVE PACEMAKER    . PACEMAKER PLACEMENT  03/2008   [facility] [facility], [place]. 8850    Current Outpatient  Medications  Medication Sig Dispense Refill  . acetaminophen (TYLENOL) 325 MG tablet Take 2 tablets (650 mg total) by mouth every 6 (six) hours as needed for mild pain, fever or headache (T over 101).    . carvedilol (COREG) 6.25 MG tablet Take 1 tablet (6.25 mg total) by mouth 2 (two) times daily. 180 tablet 3  . furosemide (LASIX) 40 MG tablet Take 1 tablet (40 mg total) by mouth daily. 90 tablet 2  . Homeopathic Products (ALLERGY MEDICINE PO) Take 1 tablet by mouth as needed (allergies).     . sacubitril-valsartan (ENTRESTO) 24-26 MG Take 1 tablet by mouth 2 (two) times daily. 60 tablet 6  . warfarin (COUMADIN) 2.5 MG tablet Take 1 1/2 tablets daily except 1 tablet on Mondays and Thursdays or as directed 45 tablet 3   No current facility-administered medications for this encounter.    Allergies  Allergen Reactions  . Bee Venom Shortness Of Breath    BEE STINGS - dizziness   . Corn-Containing Products Anaphylaxis    Corn bread, pop corn----tylenol is OK  . Codeine     REACTION: muscle cramps  . Invert Sugar   . Rice   . Vanilla Diarrhea    Social History   Socioeconomic History  . Marital status: Divorced    Spouse name: Not on file  . Number of children: Not on file  . Years of education: Not on file  . Highest education level: Not on file  Occupational History  . Not on file  Tobacco Use  . Smoking status: Former Smoker    Packs/day: 1.50    Years: 20.00    Pack years: 30.00    Types: Cigarettes    Start date: 08/20/1959    Quit date: 11/30/1977    Years since quitting: 43.1  . Smokeless tobacco: Never Used  Vaping Use  . Vaping Use: Never used  Substance and Sexual Activity  . Alcohol use: No    Alcohol/week: 0.0 standard drinks  . Drug use: No  . Sexual activity: Not on file  Other Topics Concern  . Not on file  Social History Narrative  . Not on file   Social Determinants of Health   Financial Resource Strain: Not on file  Food Insecurity: Not on file   Transportation Needs: Not on file  Physical Activity: Not on file  Stress: Not on file  Social Connections: Not on file  Intimate Partner Violence: Not on file    Family History  Problem Relation Age of Onset  . Suicidality Mother   . Cancer Father     ROS- All systems are reviewed and negative except as per the HPI above  Physical Exam: Vitals:   01/08/21 1053  Height: 5\' 10"  (1.778 m)   Wt Readings from Last 3 Encounters:  08/12/20 99.8 kg  08/05/20 100.9 kg  06/17/20 103 kg    Labs: Lab Results  Component Value Date   NA 135 03/08/2020   K 4.6 03/08/2020   CL 103 03/08/2020   CO2 21 (L) 03/08/2020   GLUCOSE 114 (H) 03/08/2020   BUN 27 (H) 03/08/2020   CREATININE 0.98 03/08/2020   CALCIUM 8.9 03/08/2020   MG 2.0 03/04/2020   Lab Results  Component Value Date   INR 2.2 12/25/2020   Lab Results  Component Value Date   CHOL 156 03/04/2020   HDL 27 (L) 03/04/2020   LDLCALC 102 (H) 03/04/2020   TRIG 136 03/04/2020     GEN- The patient is well appearing, alert and oriented x 3 today.   Head- normocephalic, atraumatic Eyes-  Sclera clear, conjunctiva pink Ears- hearing intact Oropharynx- clear Neck- supple, no JVP Lymph- no cervical lymphadenopathy Lungs- Clear to ausculation bilaterally, normal work of breathing Heart- irregular rate and rhythm, no murmurs, rubs or gallops, PMI not laterally displaced GI- soft, NT, ND, + BS Extremities- no clubbing, cyanosis, or edema MS- no significant deformity or atrophy Skin- no rash or lesion Psych- euthymic mood, full affect Neuro- strength and sensation are intact  EKG-afib at 92 bpm, qrs int 100 ms, qtc 477 ms  Epic records reviewed 1. Left ventricular ejection fraction, by estimation, is 25 to 30%. The  left ventricle has severely decreased function. The left ventricle  demonstrates global hypokinesis. Left ventricular diastolic function could  not be evaluated.  2. Right ventricular systolic function  was not well visualized. The right  ventricular size is normal. There is normal pulmonary artery systolic  pressure. The estimated right ventricular systolic pressure is 21.3 mmHg.  3. Left atrial size was mildly dilated.  4. The mitral valve is normal in structure. Trivial mitral valve  regurgitation.  5. The aortic valve is normal in structure. Aortic valve regurgitation is  not visualized.  6. The inferior vena cava is normal in size with greater than 50%  respiratory variability, suggesting right atrial pressure of 3 mmHg.   Comparison(s): Prior images reviewed side by side. Changes from prior  study  are noted. The left ventricular function is worsened.   Assessment and Plan: 1. Afib Appears to be persistent for several weeks with pt noticing exertional dyspnea and fatigue Taken off amiodarone July 2021 for pulmonary toxicity concerns  Appears  to have reasonable rate control  Cannot increase BB for hypotension (chronic)  Continue coreg 6.125 mg bid   Pt offered cardioversion but is on warfarin so will require four therapeutic INR's  Offered TEEDCCV but he says he does not feel that bad Cmet/tsh/cbc/Inr today   2. CHA2DS2VASc score of at least 3 Contiue warfarin Will let Vashti Hey know that weekly INR's are needed for possible  DCCV   3. Cardiomyopathy Lasix as needed Weight appears stable  Entresto 24/26 bid   4. Dysautonomia orthostatic hypotension syndrome  BP on arrival 84/54 Recheck 94/60  No room to adjust BB for rate control   Will see back as the INR's come in showing him to be therapeutic to get scheduled for DCCV  Lupita Leash C. Matthew Folks [facility] [address] [place], [place] [address] [phone]

## 2021-01-08 NOTE — Patient Instructions (Signed)
Pending DCCV  (1st pre DCCV)  Needs weekly x 4 Increase warfarin to 1 1/2 tablets daily Recheck in 1 wk

## 2021-01-08 NOTE — H&P (View-Only) (Signed)
Primary Care Physician: Suzan Slick, MD Referring Physician: Dr. Ladona Ridgel Graciela Husbands   [name] is a 80 y.o. male with a h/o paroxysmal afib, cardiomyopathy, HF  and PPM, on coumadin with a CHA2DS2VASc score of at least 3, in the afib clinic for recent increase in afib.. Last INR 12/25/20, was 2.2. EKG today shows afib at 92 bpm. BP is chronically soft and cannot tolerate any further up titration of BB. He has noted fatigue and some exertional dyspnea for a few weeks. His weight is stable. He is on carvedilol 6.25 mg bid. He was on amiodarone that was discontinued in July 2021 for concerns of pulmonary toxicity on the appointment with Salli Quarry. He was hospitalized last April, when amiodarone was started,  for respiratory failure with Covid.   Today, he denies symptoms of palpitations, chest pain, shortness of breath, orthopnea, PND, lower extremity edema, dizziness, presyncope, syncope, or neurologic sequela. + for exertional dyspnea and fatigue.The patient is tolerating medications without difficulties and is otherwise without complaint today.   Past Medical History:  Diagnosis Date  . Anginal pain (HCC)   . Asthma   . Bronchitis   . 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 History of epididymitis   . History of recurrent UTIs   . History of ruptured appendix   . Paroxysmal atrial fibrillation (HCC)   . Peptic ulcer disease   . Pneumonia due to COVID-19 virus   . Presence of permanent cardiac pacemaker   . Tachy-brady syndrome (HCC)    PPM (SJM) implanted by Dr Graciela Husbands 10/19/99, gen change 04/03/08   Past Surgical History:  Procedure Laterality Date  . EP IMPLANTABLE DEVICE [name] 11/17/2016   Procedure: PPM Generator Changeout;  Surgeon: Marinus Maw, MD;  Location: [facility] INVASIVE CV LAB;  Service: Cardiovascular;  Laterality: [name];  . INSERT / REPLACE / REMOVE PACEMAKER    . PACEMAKER PLACEMENT  03/2008   [facility], [place]. 8850    Current Outpatient  Medications  Medication Sig Dispense Refill  . acetaminophen (TYLENOL) 325 MG tablet Take 2 tablets (650 mg total) by mouth every 6 (six) hours as needed for mild pain, fever or headache (T over 101).    . carvedilol (COREG) 6.25 MG tablet Take 1 tablet (6.25 mg total) by mouth 2 (two) times daily. 180 tablet 3  . furosemide (LASIX) 40 MG tablet Take 1 tablet (40 mg total) by mouth daily. 90 tablet 2  . Homeopathic Products (ALLERGY MEDICINE PO) Take 1 tablet by mouth as needed (allergies).     . sacubitril-valsartan (ENTRESTO) 24-26 MG Take 1 tablet by mouth 2 (two) times daily. 60 tablet 6  . warfarin (COUMADIN) 2.5 MG tablet Take 1 1/2 tablets daily except 1 tablet on Mondays and Thursdays or as directed 45 tablet 3   No current facility-administered medications for this encounter.    Allergies  Allergen Reactions  . Bee Venom Shortness Of Breath    BEE STINGS - dizziness   . Corn-Containing Products Anaphylaxis    Corn bread, pop corn----tylenol is OK  . Codeine     REACTION: muscle cramps  . Invert Sugar   . Rice   . Vanilla Diarrhea    Social History   Socioeconomic History  . Marital status: Divorced    Spouse name: Not on file  . Number of children: Not on file  . Years of education: Not on file  . Highest education level: Not on file  Occupational History  . Not on file  Tobacco Use  . Smoking status: Former Smoker    Packs/day: 1.50    Years: 20.00    Pack years: 30.00    Types: Cigarettes    Start date: 08/20/1959    Quit date: 11/30/1977    Years since quitting: 43.1  . Smokeless tobacco: Never Used  Vaping Use  . Vaping Use: Never used  Substance and Sexual Activity  . Alcohol use: No    Alcohol/week: 0.0 standard drinks  . Drug use: No  . Sexual activity: Not on file  Other Topics Concern  . Not on file  Social History Narrative  . Not on file   Social Determinants of Health   Financial Resource Strain: Not on file  Food Insecurity: Not on file   Transportation Needs: Not on file  Physical Activity: Not on file  Stress: Not on file  Social Connections: Not on file  Intimate Partner Violence: Not on file    Family History  Problem Relation Age of Onset  . Suicidality Mother   . Cancer Father     ROS- All systems are reviewed and negative except as per the HPI above  Physical Exam: Vitals:   01/08/21 1053  Height: 5' 10" (1.778 m)   Wt Readings from Last 3 Encounters:  08/12/20 99.8 kg  08/05/20 100.9 kg  06/17/20 103 kg    Labs: Lab Results  Component Value Date   NA 135 03/08/2020   K 4.6 03/08/2020   CL 103 03/08/2020   CO2 21 (L) 03/08/2020   GLUCOSE 114 (H) 03/08/2020   BUN 27 (H) 03/08/2020   CREATININE 0.98 03/08/2020   CALCIUM 8.9 03/08/2020   MG 2.0 03/04/2020   Lab Results  Component Value Date   INR 2.2 12/25/2020   Lab Results  Component Value Date   CHOL 156 03/04/2020   HDL 27 (L) 03/04/2020   LDLCALC 102 (H) 03/04/2020   TRIG 136 03/04/2020     GEN- The patient is well appearing, alert and oriented x 3 today.   Head- normocephalic, atraumatic Eyes-  Sclera clear, conjunctiva pink Ears- hearing intact Oropharynx- clear Neck- supple, no JVP Lymph- no cervical lymphadenopathy Lungs- Clear to ausculation bilaterally, normal work of breathing Heart- irregular rate and rhythm, no murmurs, rubs or gallops, PMI not laterally displaced GI- soft, NT, ND, + BS Extremities- no clubbing, cyanosis, or edema MS- no significant deformity or atrophy Skin- no rash or lesion Psych- euthymic mood, full affect Neuro- strength and sensation are intact  EKG-afib at 92 bpm, qrs int 100 ms, qtc 477 ms  Epic records reviewed 1. Left ventricular ejection fraction, by estimation, is 25 to 30%. The  left ventricle has severely decreased function. The left ventricle  demonstrates global hypokinesis. Left ventricular diastolic function could  not be evaluated.  2. Right ventricular systolic function  was not well visualized. The right  ventricular size is normal. There is normal pulmonary artery systolic  pressure. The estimated right ventricular systolic pressure is 21.3 mmHg.  3. Left atrial size was mildly dilated.  4. The mitral valve is normal in structure. Trivial mitral valve  regurgitation.  5. The aortic valve is normal in structure. Aortic valve regurgitation is  not visualized.  6. The inferior vena cava is normal in size with greater than 50%  respiratory variability, suggesting right atrial pressure of 3 mmHg.   Comparison(s): Prior images reviewed side by side. Changes from prior  study   are noted. The left ventricular function is worsened.   Assessment and Plan: 1. Afib Appears to be persistent for several weeks with pt noticing exertional dyspnea and fatigue Taken off amiodarone July 2021 for pulmonary toxicity concerns  Appears  to have reasonable rate control  Cannot increase BB for hypotension (chronic)  Continue coreg 6.125 mg bid   Pt offered cardioversion but is on warfarin so will require four therapeutic INR's  Offered TEEDCCV but he says he does not feel that bad Cmet/tsh/cbc/Inr today   2. CHA2DS2VASc score of at least 3 Contiue warfarin Will let Vashti Hey know that weekly INR's are needed for possible  DCCV   3. Cardiomyopathy Lasix as needed Weight appears stable  Entresto 24/26 bid   4. Dysautonomia orthostatic hypotension syndrome  BP on arrival 84/54 Recheck 94/60  No room to adjust BB for rate control   Will see back as the INR's come in showing him to be therapeutic to get scheduled for DCCV  Lupita Leash C. [facility] [address] [place], [place] [address] [phone]

## 2021-01-08 NOTE — Patient Instructions (Signed)
Cameron Hey RN will be contacting you to start weekly INR checks. Once we have 4 normal checks we will setup cardioversion.

## 2021-01-15 ENCOUNTER — Ambulatory Visit (INDEPENDENT_AMBULATORY_CARE_PROVIDER_SITE_OTHER): Payer: Medicare Other | Admitting: *Deleted

## 2021-01-15 DIAGNOSIS — Z5181 Encounter for therapeutic drug level monitoring: Secondary | ICD-10-CM

## 2021-01-15 DIAGNOSIS — I480 Paroxysmal atrial fibrillation: Secondary | ICD-10-CM

## 2021-01-15 LAB — POCT INR: INR: 4.2 — AB (ref 2.0–3.0)

## 2021-01-15 NOTE — Patient Instructions (Signed)
Pending DCCV  (1st pre DCCV)  Needs weekly x 4 Pt has been taking 2 tablets daily instead of prescribed 1 1/2 tablets daily Hold warfarin tonight, take 1 tablet tomorrow night then decrease dose to 1 1/2 tablets daily as directed last office visit. Recheck in 1 wk

## 2021-01-22 ENCOUNTER — Encounter: Payer: Self-pay | Admitting: Cardiology

## 2021-01-22 ENCOUNTER — Ambulatory Visit (INDEPENDENT_AMBULATORY_CARE_PROVIDER_SITE_OTHER): Payer: Medicare Other | Admitting: *Deleted

## 2021-01-22 ENCOUNTER — Encounter: Payer: Self-pay | Admitting: *Deleted

## 2021-01-22 DIAGNOSIS — I480 Paroxysmal atrial fibrillation: Secondary | ICD-10-CM

## 2021-01-22 DIAGNOSIS — Z5181 Encounter for therapeutic drug level monitoring: Secondary | ICD-10-CM | POA: Diagnosis not present

## 2021-01-22 LAB — POCT INR: INR: 3.2 — AB (ref 2.0–3.0)

## 2021-01-22 NOTE — Patient Instructions (Signed)
Pending DCCV  (3rd pre DCCV)  Needs weekly x 4 Take warfarin 1 tablet tonight then continue 1 1/2 tablets daily Recheck in 1 wk

## 2021-01-23 ENCOUNTER — Encounter: Payer: Self-pay | Admitting: *Deleted

## 2021-01-29 ENCOUNTER — Other Ambulatory Visit: Payer: Self-pay

## 2021-01-29 ENCOUNTER — Ambulatory Visit (INDEPENDENT_AMBULATORY_CARE_PROVIDER_SITE_OTHER): Payer: Medicare Other | Admitting: *Deleted

## 2021-01-29 DIAGNOSIS — Z79899 Other long term (current) drug therapy: Secondary | ICD-10-CM

## 2021-01-29 DIAGNOSIS — Z5181 Encounter for therapeutic drug level monitoring: Secondary | ICD-10-CM | POA: Diagnosis not present

## 2021-01-29 DIAGNOSIS — I480 Paroxysmal atrial fibrillation: Secondary | ICD-10-CM | POA: Diagnosis not present

## 2021-01-29 LAB — POCT INR: INR: 2.9 (ref 2.0–3.0)

## 2021-01-29 NOTE — Patient Instructions (Signed)
Pending DCCV  (4th pre DCCV)  DCCV scheduled for 02/05/21 Labs and Covid test 3/9 at [facility] at 8:30am Continue warfarin 1 1/2 tablets daily  Recheck week after procedure

## 2021-02-03 NOTE — Patient Instructions (Signed)
[name]  02/03/2021     @PREFPERIOPPHARMACY @   Your procedure is scheduled on  02/05/2021   Report to 04/07/2021 at  0830  A.M.   Call this number if you have problems the morning of surgery:  [phone]   Remember:  Do not eat or drink after midnight.                       Take these medicines the morning of surgery with A SIP OF WATER  Entresto.     DO NOT miss any doses of your coumadin.    Please brush your teeth.  Do not wear jewelry, make-up or nail polish.  Do not wear lotions, powders, or perfumes, or deodorant.  Do not shave 48 hours prior to surgery.  Men may shave face and neck.  Do not bring valuables to the hospital.  [facility] is not responsible for any belongings or valuables.  Contacts, dentures or bridgework may not be worn into surgery.  Leave your suitcase in the car.  After surgery it may be brought to your room.  For patients admitted to the hospital, discharge time will be determined by your treatment team.  Patients discharged the day of surgery will not be allowed to drive home and must have someone with them for 24 hours.   Special instructions:  DO NOT smoke tobacco or vape the morning of your procedure.    Please read over the following fact sheets that you were given. Anesthesia Post-op Instructions and Care and Recovery After Surgery       Electrical Cardioversion Electrical cardioversion is the delivery of a jolt of electricity to restore a normal rhythm to the heart. A rhythm that is too fast or is not regular keeps the heart from pumping well. In this procedure, sticky patches or metal paddles are placed on the chest to deliver electricity to the heart from a device. This procedure may be done in an emergency if:  There is low or no blood pressure as a result of the heart rhythm.  Normal rhythm must be restored as fast as possible to protect the brain and heart from further damage.  It may save a life. This may  also be a scheduled procedure for irregular or fast heart rhythms that are not immediately life-threatening. Tell a health care provider about:  Any allergies you have.  All medicines you are taking, including vitamins, herbs, eye drops, creams, and over-the-counter medicines.  Any problems you or family members have had with anesthetic medicines.  Any blood disorders you have.  Any surgeries you have had.  Any medical conditions you have.  Whether you are pregnant or may be pregnant. What are the risks? Generally, this is a safe procedure. However, problems may occur, including:  Allergic reactions to medicines.  A blood clot that breaks free and travels to other parts of your body.  The possible return of an abnormal heart rhythm within hours or days after the procedure.  Your heart stopping (cardiac arrest). This is rare. What happens before the procedure? Medicines  Your health care provider may have you start taking: ? Blood-thinning medicines (anticoagulants) so your blood does not clot as easily. ? Medicines to help stabilize your heart rate and rhythm.  Ask your health care provider about: ? Changing or stopping your regular medicines. This is especially important if you are taking diabetes medicines or blood thinners. ?  Taking medicines such as aspirin and ibuprofen. These medicines can thin your blood. Do not take these medicines unless your health care provider tells you to take them. ? Taking over-the-counter medicines, vitamins, herbs, and supplements. General instructions  Follow instructions from your health care provider about eating or drinking restrictions.  Plan to have someone take you home from the hospital or clinic.  If you will be going home right after the procedure, plan to have someone with you for 24 hours.  Ask your health care provider what steps will be taken to help prevent infection. These may include washing your skin with a germ-killing  soap. What happens during the procedure?  An IV will be inserted into one of your veins.  Sticky patches (electrodes) or metal paddles may be placed on your chest.  You will be given a medicine to help you relax (sedative).  An electrical shock will be delivered. The procedure may vary among health care providers and hospitals.   What can I expect after the procedure?  Your blood pressure, heart rate, breathing rate, and blood oxygen level will be monitored until you leave the hospital or clinic.  Your heart rhythm will be watched to make sure it does not change.  You may have some redness on the skin where the shocks were given. Follow these instructions at home:  Do not drive for 24 hours if you were given a sedative during your procedure.  Take over-the-counter and prescription medicines only as told by your health care provider.  Ask your health care provider how to check your pulse. Check it often.  Rest for 48 hours after the procedure or as told by your health care provider.  Avoid or limit your caffeine use as told by your health care provider.  Keep all follow-up visits as told by your health care provider. This is important. Contact a health care provider if:  You feel like your heart is beating too quickly or your pulse is not regular.  You have a serious muscle cramp that does not go away. Get help right away if:  You have discomfort in your chest.  You are dizzy or you feel faint.  You have trouble breathing or you are short of breath.  Your speech is slurred.  You have trouble moving an arm or leg on one side of your body.  Your fingers or toes turn cold or blue. Summary  Electrical cardioversion is the delivery of a jolt of electricity to restore a normal rhythm to the heart.  This procedure may be done right away in an emergency or may be a scheduled procedure if the condition is not an emergency.  Generally, this is a safe procedure.  After  the procedure, check your pulse often as told by your health care provider. This information is not intended to replace advice given to you by your health care provider. Make sure you discuss any questions you have with your health care provider. Document Revised: 06/18/2019 Document Reviewed: 06/18/2019 Elsevier Patient Education  2021 Else[employer] Inc. Monitored Anesthesia Care, Care After This sheet gives you information about how to care for yourself after your procedure. Your health care provider may also give you more specific instructions. If you have problems or questions, contact your health care provider. What can I expect after the procedure? After the procedure, it is common to have:  Tiredness.  Forgetfulness about what happened after the procedure.  Impaired judgment for important decisions.  Nausea or vomiting.  Some difficulty with balance. Follow these instructions at home: For the time period you were told by your health care provider:  Rest as needed.  Do not participate in activities where you could fall or become injured.  Do not drive or use machinery.  Do not drink alcohol.  Do not take sleeping pills or medicines that cause drowsiness.  Do not make important decisions or sign legal documents.  Do not take care of children on your own.      Eating and drinking  Follow the diet that is recommended by your health care provider.  Drink enough fluid to keep your urine pale yellow.  If you vomit: ? Drink water, juice, or soup when you can drink without vomiting. ? Make sure you have little or no nausea before eating solid foods. General instructions  Have a responsible adult stay with you for the time you are told. It is important to have someone help care for you until you are awake and alert.  Take over-the-counter and prescription medicines only as told by your health care provider.  If you have sleep apnea, surgery and certain medicines can  increase your risk for breathing problems. Follow instructions from your health care provider about wearing your sleep device: ? Anytime you are sleeping, including during daytime naps. ? While taking prescription pain medicines, sleeping medicines, or medicines that make you drowsy.  Avoid smoking.  Keep all follow-up visits as told by your health care provider. This is important. Contact a health care provider if:  You keep feeling nauseous or you keep vomiting.  You feel light-headed.  You are still sleepy or having trouble with balance after 24 hours.  You develop a rash.  You have a fever.  You have redness or swelling around the IV site. Get help right away if:  You have trouble breathing.  You have new-onset confusion at home. Summary  For several hours after your procedure, you may feel tired. You may also be forgetful and have poor judgment.  Have a responsible adult stay with you for the time you are told. It is important to have someone help care for you until you are awake and alert.  Rest as told. Do not drive or operate machinery. Do not drink alcohol or take sleeping pills.  Get help right away if you have trouble breathing, or if you suddenly become confused. This information is not intended to replace advice given to you by your health care provider. Make sure you discuss any questions you have with your health care provider. Document Revised: 07/31/2020 Document Reviewed: 10/18/2019 Elsevier Patient Education  2021 [employer].

## 2021-02-04 ENCOUNTER — Encounter (HOSPITAL_COMMUNITY): Payer: Self-pay

## 2021-02-04 ENCOUNTER — Other Ambulatory Visit (HOSPITAL_COMMUNITY)
Admission: RE | Admit: 2021-02-04 | Discharge: 2021-02-04 | Disposition: A | Discharge status: Home / Self Care | Payer: Medicare Other | Source: Ambulatory Visit | Attending: Cardiology | Admitting: Cardiology

## 2021-02-04 ENCOUNTER — Other Ambulatory Visit: Payer: Self-pay

## 2021-02-04 ENCOUNTER — Other Ambulatory Visit (HOSPITAL_COMMUNITY): Payer: Self-pay | Admitting: *Deleted

## 2021-02-04 ENCOUNTER — Encounter (HOSPITAL_COMMUNITY)
Admission: RE | Admit: 2021-02-04 | Discharge: 2021-02-04 | Disposition: A | Discharge status: Home / Self Care | Payer: Medicare Other | Source: Ambulatory Visit | Attending: Cardiology | Admitting: Cardiology

## 2021-02-04 DIAGNOSIS — Z01812 Encounter for preprocedural laboratory examination: Secondary | ICD-10-CM | POA: Diagnosis present

## 2021-02-04 DIAGNOSIS — Z20822 Contact with and (suspected) exposure to covid-19: Secondary | ICD-10-CM | POA: Diagnosis not present

## 2021-02-04 HISTORY — DX: Other specified postprocedural states: R11.2

## 2021-02-04 HISTORY — DX: Other specified postprocedural states: Z98.890

## 2021-02-04 LAB — BASIC METABOLIC PANEL
Anion gap: 9 (ref 5–15)
BUN: 21 mg/dL (ref 8–23)
CO2: 21 mmol/L — ABNORMAL LOW (ref 22–32)
Calcium: 9.1 mg/dL (ref 8.9–10.3)
Chloride: 109 mmol/L (ref 98–111)
Creatinine, Ser: 1.04 mg/dL (ref 0.61–1.24)
GFR, Estimated: >60 mL/min (ref >60)
Glucose, Bld: 100 mg/dL — ABNORMAL HIGH (ref 70–99)
Potassium: 4.3 mmol/L (ref 3.5–5.1)
Sodium: 139 mmol/L (ref 135–145)

## 2021-02-04 LAB — PROTIME-INR
INR: 2.7 — ABNORMAL HIGH (ref 0.8–1.2)
Prothrombin Time: 27.7 seconds — ABNORMAL HIGH (ref 11.4–15.2)

## 2021-02-04 LAB — SARS CORONAVIRUS 2 (TAT 6-24 HRS): SARS Coronavirus 2: NEGATIVE

## 2021-02-05 ENCOUNTER — Encounter (HOSPITAL_COMMUNITY): Payer: Self-pay | Admitting: Cardiology

## 2021-02-05 ENCOUNTER — Encounter (HOSPITAL_COMMUNITY)
Admission: RE | Disposition: A | Discharge status: Home / Self Care | Payer: Self-pay | Source: Home / Self Care | Attending: Cardiology

## 2021-02-05 ENCOUNTER — Ambulatory Visit (HOSPITAL_COMMUNITY): Payer: Medicare Other | Admitting: Anesthesiology

## 2021-02-05 ENCOUNTER — Ambulatory Visit (HOSPITAL_COMMUNITY)
Admission: RE | Admit: 2021-02-05 | Discharge: 2021-02-05 | Disposition: A | Discharge status: Home / Self Care | Payer: Medicare Other | Attending: Cardiology | Admitting: Cardiology

## 2021-02-05 DIAGNOSIS — Z91018 Allergy to other foods: Secondary | ICD-10-CM | POA: Insufficient documentation

## 2021-02-05 DIAGNOSIS — Z79899 Other long term (current) drug therapy: Secondary | ICD-10-CM | POA: Diagnosis not present

## 2021-02-05 DIAGNOSIS — Z91030 Bee allergy status: Secondary | ICD-10-CM | POA: Diagnosis not present

## 2021-02-05 DIAGNOSIS — Z87891 Personal history of nicotine dependence: Secondary | ICD-10-CM | POA: Diagnosis not present

## 2021-02-05 DIAGNOSIS — Z7901 Long term (current) use of anticoagulants: Secondary | ICD-10-CM | POA: Insufficient documentation

## 2021-02-05 DIAGNOSIS — I429 Cardiomyopathy, unspecified: Secondary | ICD-10-CM | POA: Diagnosis not present

## 2021-02-05 DIAGNOSIS — I4819 Other persistent atrial fibrillation: Secondary | ICD-10-CM | POA: Insufficient documentation

## 2021-02-05 DIAGNOSIS — G901 Familial dysautonomia [Riley-Day]: Secondary | ICD-10-CM | POA: Insufficient documentation

## 2021-02-05 DIAGNOSIS — Z950 Presence of cardiac pacemaker: Secondary | ICD-10-CM | POA: Diagnosis not present

## 2021-02-05 DIAGNOSIS — Z8616 Personal history of COVID-19: Secondary | ICD-10-CM | POA: Insufficient documentation

## 2021-02-05 HISTORY — PX: CARDIOVERSION: SHX1299

## 2021-02-05 SURGERY — CARDIOVERSION
Anesthesia: General

## 2021-02-05 MED ORDER — CHLORHEXIDINE GLUCONATE 0.12 % MT SOLN
15.0000 mL | Freq: Once | OROMUCOSAL | Status: AC
  Administered 2021-02-05: 15 mL via OROMUCOSAL

## 2021-02-05 MED ORDER — PROPOFOL 10 MG/ML IV BOLUS
INTRAVENOUS | Status: DC | PRN
  Administered 2021-02-05: 80 mg via INTRAVENOUS

## 2021-02-05 MED ORDER — LACTATED RINGERS IV SOLN
INTRAVENOUS | Status: DC
  Administered 2021-02-05: 1000 mL via INTRAVENOUS

## 2021-02-05 MED ORDER — ORAL CARE MOUTH RINSE: 15.0000 mL | Freq: Once | OROMUCOSAL | Status: AC

## 2021-02-05 NOTE — Transfer of Care (Signed)
Immediate Anesthesia Transfer of Care Note  Patient: [name]  Procedure(s) Performed: CARDIOVERSION ([name] )  Patient Location: PACU  Anesthesia Type:General  Level of Consciousness: drowsy  Airway & Oxygen Therapy: Patient Spontanous Breathing and Patient connected to nasal cannula oxygen  Post-op Assessment: Report given to RN and Post -op Vital signs reviewed and stable  Post vital signs: Reviewed and stable  Last Vitals:  Vitals Value Taken Time  BP 105/89   Temp 98.1   Pulse 99   Resp 15   SpO2 96%     Last Pain:  Vitals:   02/05/21 0908  TempSrc: Oral  PainSc: 0-No pain      Patients Stated Pain Goal: 5 (02/05/21 0908)  Complications: No complications documented.

## 2021-02-05 NOTE — Anesthesia Postprocedure Evaluation (Signed)
Anesthesia Post Note  Patient: [name]  Procedure(s) Performed: CARDIOVERSION ([name] )  Patient location during evaluation: PACU Anesthesia Type: General Level of consciousness: awake and alert and oriented Pain management: satisfactory to patient Vital Signs Assessment: post-procedure vital signs reviewed and stable Respiratory status: spontaneous breathing and respiratory function stable Cardiovascular status: blood pressure returned to baseline and stable Postop Assessment: no apparent nausea or vomiting and adequate PO intake Anesthetic complications: no   No complications documented.   Last Vitals:  Vitals:   02/05/21 1030 02/05/21 1040  BP: 108/82   Pulse: 67 78  Resp: 13 17  Temp:    SpO2: 99% 98%    Last Pain:  Vitals:   02/05/21 1040  TempSrc:   PainSc: 0-No pain                 Lorin Glass

## 2021-02-05 NOTE — CV Procedure (Signed)
Elective direct-current cardioversion  Indication: Persistent atrial fibrillation.  Description of procedure: After informed consent was obtained, patient was taken to the PACU where a timeout was performed.  Anterior and posterior pads were placed in standard fashion and connected to a biphasic defibrillator.  Deep sedation was achieved via propofol with administration and monitoring by the Anesthesia service.  With sandbag on anterior chest pad, a single synchronized 150 J shock was delivered with persistent atrial fibrillation present thereafter.  A second, synchronized 200 J shock was then delivered with persistent atrial fibrillation present thereafter.  A third, synchronized 200 J shock was then delivered with persistent atrial fibrillation thereafter.  He remained hemodynamically stable throughout.  Persistent atrial fibrillation was confirmed by follow-up ECG.  There were no immediate complications.  Disposition: Patient will be discharged home today, continue same outpatient cardiac regimen and keep follow-up as scheduled for echocardiogram and clinical reassessment in the [facility] office.  Anticipate strategy of heart rate control and anticoagulation at this point.  Jonelle Sidle, M.D., F.A.C.C.

## 2021-02-05 NOTE — Discharge Instructions (Signed)
Electrical Cardioversion Electrical cardioversion is the delivery of a jolt of electricity to restore a normal rhythm to the heart. A rhythm that is too fast or is not regular keeps the heart from pumping well. In this procedure, sticky patches or metal paddles are placed on the chest to deliver electricity to the heart from a device. This procedure may be done in an emergency if:  There is low or no blood pressure as a result of the heart rhythm.  Normal rhythm must be restored as fast as possible to protect the brain and heart from further damage.  It may save a life. This may also be a scheduled procedure for irregular or fast heart rhythms that are not immediately life-threatening. Tell a health care provider about:  Any allergies you have.  All medicines you are taking, including vitamins, herbs, eye drops, creams, and over-the-counter medicines.  Any problems you or family members have had with anesthetic medicines.  Any blood disorders you have.  Any surgeries you have had.  Any medical conditions you have.  Whether you are pregnant or may be pregnant. What are the risks? Generally, this is a safe procedure. However, problems may occur, including:  Allergic reactions to medicines.  A blood clot that breaks free and travels to other parts of your body.  The possible return of an abnormal heart rhythm within hours or days after the procedure.  Your heart stopping (cardiac arrest). This is rare. What happens before the procedure? Medicines  Your health care provider may have you start taking: ? Blood-thinning medicines (anticoagulants) so your blood does not clot as easily. ? Medicines to help stabilize your heart rate and rhythm.  Ask your health care provider about: ? Changing or stopping your regular medicines. This is especially important if you are taking diabetes medicines or blood thinners. ? Taking medicines such as aspirin and ibuprofen. These medicines can  thin your blood. Do not take these medicines unless your health care provider tells you to take them. ? Taking over-the-counter medicines, vitamins, herbs, and supplements. General instructions  Follow instructions from your health care provider about eating or drinking restrictions.  Plan to have someone take you home from the hospital or clinic.  If you will be going home right after the procedure, plan to have someone with you for 24 hours.  Ask your health care provider what steps will be taken to help prevent infection. These may include washing your skin with a germ-killing soap. What happens during the procedure?  An IV will be inserted into one of your veins.  Sticky patches (electrodes) or metal paddles may be placed on your chest.  You will be given a medicine to help you relax (sedative).  An electrical shock will be delivered. The procedure may vary among health care providers and hospitals.   What can I expect after the procedure?  Your blood pressure, heart rate, breathing rate, and blood oxygen level will be monitored until you leave the hospital or clinic.  Your heart rhythm will be watched to make sure it does not change.  You may have some redness on the skin where the shocks were given. Follow these instructions at home:  Do not drive for 24 hours if you were given a sedative during your procedure.  Take over-the-counter and prescription medicines only as told by your health care provider.  Ask your health care provider how to check your pulse. Check it often.  Rest for 48 hours after the procedure   or as told by your health care provider.  Avoid or limit your caffeine use as told by your health care provider.  Keep all follow-up visits as told by your health care provider. This is important. Contact a health care provider if:  You feel like your heart is beating too quickly or your pulse is not regular.  You have a serious muscle cramp that does not go  away. Get help right away if:  You have discomfort in your chest.  You are dizzy or you feel faint.  You have trouble breathing or you are short of breath.  Your speech is slurred.  You have trouble moving an arm or leg on one side of your body.  Your fingers or toes turn cold or blue. Summary  Electrical cardioversion is the delivery of a jolt of electricity to restore a normal rhythm to the heart.  This procedure may be done right away in an emergency or may be a scheduled procedure if the condition is not an emergency.  Generally, this is a safe procedure.  After the procedure, check your pulse often as told by your health care provider. This information is not intended to replace advice given to you by your health care provider. Make sure you discuss any questions you have with your health care provider. Document Revised: 06/18/2019 Document Reviewed: 06/18/2019 Elsevier Patient Education  2021 Elsevier Inc.         Monitored Anesthesia Care, Care After This sheet gives you information about how to care for yourself after your procedure. Your health care provider may also give you more specific instructions. If you have problems or questions, contact your health care provider. What can I expect after the procedure? After the procedure, it is common to have:  Tiredness.  Forgetfulness about what happened after the procedure.  Impaired judgment for important decisions.  Nausea or vomiting.  Some difficulty with balance. Follow these instructions at home: For the time period you were told by your health care provider:  Rest as needed.  Do not participate in activities where you could fall or become injured.  Do not drive or use machinery.  Do not drink alcohol.  Do not take sleeping pills or medicines that cause drowsiness.  Do not make important decisions or sign legal documents.  Do not take care of children on your own.      Eating and  drinking  Follow the diet that is recommended by your health care provider.  Drink enough fluid to keep your urine pale yellow.  If you vomit: ? Drink water, juice, or soup when you can drink without vomiting. ? Make sure you have little or no nausea before eating solid foods. General instructions  Have a responsible adult stay with you for the time you are told. It is important to have someone help care for you until you are awake and alert.  Take over-the-counter and prescription medicines only as told by your health care provider.  If you have sleep apnea, surgery and certain medicines can increase your risk for breathing problems. Follow instructions from your health care provider about wearing your sleep device: ? Anytime you are sleeping, including during daytime naps. ? While taking prescription pain medicines, sleeping medicines, or medicines that make you drowsy.  Avoid smoking.  Keep all follow-up visits as told by your health care provider. This is important. Contact a health care provider if:  You keep feeling nauseous or you keep vomiting.  You feel   light-headed.  You are still sleepy or having trouble with balance after 24 hours.  You develop a rash.  You have a fever.  You have redness or swelling around the IV site. Get help right away if:  You have trouble breathing.  You have new-onset confusion at home. Summary  For several hours after your procedure, you may feel tired. You may also be forgetful and have poor judgment.  Have a responsible adult stay with you for the time you are told. It is important to have someone help care for you until you are awake and alert.  Rest as told. Do not drive or operate machinery. Do not drink alcohol or take sleeping pills.  Get help right away if you have trouble breathing, or if you suddenly become confused. This information is not intended to replace advice given to you by your health care provider. Make sure  you discuss any questions you have with your health care provider. Document Revised: 07/31/2020 Document Reviewed: 10/18/2019 Elsevier Patient Education  2021 Elsevier Inc.     

## 2021-02-05 NOTE — Progress Notes (Incomplete)
Pt has a

## 2021-02-05 NOTE — Interval H&P Note (Signed)
History and Physical Interval Note:  02/05/2021 10:20 AM  Patient presents for elective cardioversion arranged by Ms. Noralyn Pick NP as discussed above.  Mr. Saric reports compliance with all of his medications including anticoagulation, I reviewed his recent lab work.  All INR levels therapeutic over the last 4 weeks.  He is ready to proceed.  Jonelle Sidle, M.D., F.A.C.C.

## 2021-02-05 NOTE — Progress Notes (Signed)
Electrical Cardioversion Procedure Note [name] [id number] 05/26/1941  Procedure: Electrical Cardioversion Indications: A-Fib Procedure Details Consent: yes Time Out: Verified patient identification, verified procedure, site/side was marked, verified correct patient position, special equipment/implants available, medications/allergies/relevent history reviewed, required imaging and test results available. Time out [phone] Patient placed on cardiac monitor, pulse oximetry, supplemental oxygen as necessary.  Sedation given: yes - propofol Pacer pads placed  Yes front and back Cardioverted 3 times Cardioverted at  150 joules once, 200 joules twice  Evaluation Findings: Post procedure EKG shows: A- Fib Complications: none Patient  did tolerate procedure well.   [name] 02/05/2021, 10:37 AM

## 2021-02-05 NOTE — Anesthesia Preprocedure Evaluation (Signed)
Anesthesia Evaluation  Patient identified by MRN, date of birth, ID band Patient awake    Reviewed: Allergy & Precautions, NPO status , Patient's Chart, lab work & pertinent test results, reviewed documented beta blocker date and time   History of Anesthesia Complications (+) PONV and history of anesthetic complications  Airway Mallampati: III  TM Distance: <3 FB Neck ROM: Full    Dental  (+) Dental Advisory Given, Chipped, Missing   Pulmonary asthma , sleep apnea and Oxygen sleep apnea , pneumonia, resolved, former smoker,    breath sounds clear to auscultation       Cardiovascular Exercise Tolerance: Poor (-) hypertensionPt. on home beta blockers + angina + dysrhythmias (On coumadin) Atrial Fibrillation + pacemaker  Rhythm:Irregular Rate:Bradycardia  1. Left ventricular ejection fraction, by estimation, is 25 to 30%. The left ventricle has severely decreased function. The left ventricle demonstrates global hypokinesis. Left ventricular diastolic function could not be evaluated.  2. Right ventricular systolic function was not well visualized. The right ventricular size is normal. There is normal pulmonary artery systolic  pressure. The estimated right ventricular systolic pressure is 21.3 mmHg.  3. Left atrial size was mildly dilated.  4. The mitral valve is normal in structure. Trivial mitral valve  regurgitation.  5. The aortic valve is normal in structure. Aortic valve regurgitation is not visualized.  6. The inferior vena cava is normal in size with greater than 50% respiratory variability, suggesting right atrial pressure of 3 mmHg.   Comparison(s): Prior images reviewed side by side. Changes from prior  study are noted. The left ventricular function is worsened   Neuro/Psych PSYCHIATRIC DISORDERS Depression    GI/Hepatic Neg liver ROS, PUD,   Endo/Other  negative endocrine ROS  Renal/GU negative Renal ROS      Musculoskeletal   Abdominal   Peds  Hematology negative hematology ROS (+)   Anesthesia Other Findings   Reproductive/Obstetrics                            Anesthesia Physical Anesthesia Plan  ASA: IV  Anesthesia Plan: General   Post-op Pain Management:    Induction: Intravenous  PONV Risk Score and Plan: TIVA  Airway Management Planned: Nasal Cannula, Natural Airway and Simple Face Mask  Additional Equipment:   Intra-op Plan:   Post-operative Plan:   Informed Consent: I have reviewed the patients History and Physical, chart, labs and discussed the procedure including the risks, benefits and alternatives for the proposed anesthesia with the patient or authorized representative who has indicated his/her understanding and acceptance.     Dental advisory given  Plan Discussed with: CRNA and Surgeon  Anesthesia Plan Comments:         Anesthesia Quick Evaluation

## 2021-02-06 ENCOUNTER — Encounter (HOSPITAL_COMMUNITY): Payer: Self-pay | Admitting: Cardiology

## 2021-02-06 NOTE — Progress Notes (Signed)
Patient called for post procedure follow up call post cardioversion on 02/05/2021. Patient complaining of shortness of breath, feeling weak, and intermittent chest pain. Burning on chest and back from procedure.  Patient told he needs to be seen in  ED for these symptoms. Verbalized understanding.

## 2021-02-11 ENCOUNTER — Other Ambulatory Visit: Payer: Medicare HMO

## 2021-02-13 ENCOUNTER — Inpatient Hospital Stay (HOSPITAL_COMMUNITY)
Admission: AD | Disposition: A | Discharge status: Home / Self Care | Payer: Self-pay | Source: Home / Self Care | Attending: Cardiology

## 2021-02-13 ENCOUNTER — Inpatient Hospital Stay (HOSPITAL_COMMUNITY)
Admission: AD | Admit: 2021-02-13 | Discharge: 2021-02-17 | DRG: 247 | Disposition: A | Discharge status: Home / Self Care | Payer: Medicare Other | Attending: Cardiology | Admitting: Cardiology

## 2021-02-13 ENCOUNTER — Other Ambulatory Visit: Payer: Self-pay

## 2021-02-13 ENCOUNTER — Ambulatory Visit: Payer: Medicare HMO | Admitting: Family Medicine

## 2021-02-13 ENCOUNTER — Encounter (HOSPITAL_COMMUNITY): Payer: Self-pay | Admitting: Cardiovascular Disease

## 2021-02-13 ENCOUNTER — Inpatient Hospital Stay (HOSPITAL_COMMUNITY): Payer: Medicare Other

## 2021-02-13 DIAGNOSIS — E785 Hyperlipidemia, unspecified: Secondary | ICD-10-CM

## 2021-02-13 DIAGNOSIS — I4819 Other persistent atrial fibrillation: Secondary | ICD-10-CM | POA: Diagnosis not present

## 2021-02-13 DIAGNOSIS — I420 Dilated cardiomyopathy: Secondary | ICD-10-CM | POA: Diagnosis present

## 2021-02-13 DIAGNOSIS — Z8616 Personal history of COVID-19: Secondary | ICD-10-CM

## 2021-02-13 DIAGNOSIS — R079 Chest pain, unspecified: Secondary | ICD-10-CM

## 2021-02-13 DIAGNOSIS — I5022 Chronic systolic (congestive) heart failure: Secondary | ICD-10-CM | POA: Diagnosis present

## 2021-02-13 DIAGNOSIS — I213 ST elevation (STEMI) myocardial infarction of unspecified site: Secondary | ICD-10-CM

## 2021-02-13 DIAGNOSIS — I2119 ST elevation (STEMI) myocardial infarction involving other coronary artery of inferior wall: Principal | ICD-10-CM | POA: Diagnosis present

## 2021-02-13 DIAGNOSIS — I2510 Atherosclerotic heart disease of native coronary artery without angina pectoris: Secondary | ICD-10-CM

## 2021-02-13 DIAGNOSIS — Z91030 Bee allergy status: Secondary | ICD-10-CM | POA: Diagnosis not present

## 2021-02-13 DIAGNOSIS — I25110 Atherosclerotic heart disease of native coronary artery with unstable angina pectoris: Secondary | ICD-10-CM

## 2021-02-13 DIAGNOSIS — Z91018 Allergy to other foods: Secondary | ICD-10-CM

## 2021-02-13 DIAGNOSIS — Z950 Presence of cardiac pacemaker: Secondary | ICD-10-CM

## 2021-02-13 DIAGNOSIS — Z20822 Contact with and (suspected) exposure to covid-19: Secondary | ICD-10-CM | POA: Diagnosis present

## 2021-02-13 DIAGNOSIS — I255 Ischemic cardiomyopathy: Secondary | ICD-10-CM | POA: Diagnosis present

## 2021-02-13 DIAGNOSIS — Z7901 Long term (current) use of anticoagulants: Secondary | ICD-10-CM | POA: Diagnosis not present

## 2021-02-13 DIAGNOSIS — I110 Hypertensive heart disease with heart failure: Secondary | ICD-10-CM | POA: Diagnosis present

## 2021-02-13 DIAGNOSIS — Z885 Allergy status to narcotic agent status: Secondary | ICD-10-CM

## 2021-02-13 DIAGNOSIS — I2111 ST elevation (STEMI) myocardial infarction involving right coronary artery: Secondary | ICD-10-CM | POA: Diagnosis present

## 2021-02-13 DIAGNOSIS — Z955 Presence of coronary angioplasty implant and graft: Secondary | ICD-10-CM | POA: Diagnosis present

## 2021-02-13 DIAGNOSIS — I495 Sick sinus syndrome: Secondary | ICD-10-CM | POA: Diagnosis present

## 2021-02-13 DIAGNOSIS — I429 Cardiomyopathy, unspecified: Secondary | ICD-10-CM | POA: Diagnosis not present

## 2021-02-13 DIAGNOSIS — Z809 Family history of malignant neoplasm, unspecified: Secondary | ICD-10-CM

## 2021-02-13 DIAGNOSIS — Z79899 Other long term (current) drug therapy: Secondary | ICD-10-CM

## 2021-02-13 DIAGNOSIS — Z818 Family history of other mental and behavioral disorders: Secondary | ICD-10-CM

## 2021-02-13 DIAGNOSIS — Z87891 Personal history of nicotine dependence: Secondary | ICD-10-CM

## 2021-02-13 DIAGNOSIS — I447 Left bundle-branch block, unspecified: Secondary | ICD-10-CM | POA: Diagnosis present

## 2021-02-13 DIAGNOSIS — I4821 Permanent atrial fibrillation: Secondary | ICD-10-CM | POA: Diagnosis present

## 2021-02-13 HISTORY — PX: LEFT HEART CATH AND CORONARY ANGIOGRAPHY: CATH118249

## 2021-02-13 HISTORY — PX: CORONARY/GRAFT ACUTE MI REVASCULARIZATION: CATH118305

## 2021-02-13 LAB — POCT I-STAT, CHEM 8
BUN: 24 mg/dL — ABNORMAL HIGH (ref 8–23)
Calcium, Ion: 1.33 mmol/L (ref 1.15–1.40)
Chloride: 106 mmol/L (ref 98–111)
Creatinine, Ser: 0.9 mg/dL (ref 0.61–1.24)
Glucose, Bld: 138 mg/dL — ABNORMAL HIGH (ref 70–99)
HCT: 48 % (ref 39.0–52.0)
Hemoglobin: 16.3 g/dL (ref 13.0–17.0)
Potassium: 4.1 mmol/L (ref 3.5–5.1)
Sodium: 141 mmol/L (ref 135–145)
TCO2: 21 mmol/L — ABNORMAL LOW (ref 22–32)

## 2021-02-13 LAB — ECHOCARDIOGRAM COMPLETE
Area-P 1/2: 4.68 cm2
Calc EF: 32.5 %
Height: 70 in
S' Lateral: 4.5 cm
Single Plane A2C EF: 44.7 %
Single Plane A4C EF: 25.4 %
Weight: 3481.5 oz

## 2021-02-13 LAB — LIPID PANEL
Cholesterol: 112 mg/dL (ref 0–200)
HDL: 29 mg/dL — ABNORMAL LOW (ref >40)
LDL Cholesterol: 73 mg/dL (ref 0–99)
Total CHOL/HDL Ratio: 3.9 RATIO
Triglycerides: 48 mg/dL (ref <150)
VLDL: 10 mg/dL (ref 0–40)

## 2021-02-13 LAB — PROTIME-INR
INR: 1.5 — ABNORMAL HIGH (ref 0.8–1.2)
Prothrombin Time: 17.7 seconds — ABNORMAL HIGH (ref 11.4–15.2)

## 2021-02-13 LAB — COMPREHENSIVE METABOLIC PANEL
ALT: 30 U/L (ref 0–44)
AST: 29 U/L (ref 15–41)
Albumin: 3.3 g/dL — ABNORMAL LOW (ref 3.5–5.0)
Alkaline Phosphatase: 58 U/L (ref 38–126)
Anion gap: 9 (ref 5–15)
BUN: 22 mg/dL (ref 8–23)
CO2: 21 mmol/L — ABNORMAL LOW (ref 22–32)
Calcium: 9 mg/dL (ref 8.9–10.3)
Chloride: 108 mmol/L (ref 98–111)
Creatinine, Ser: 1.09 mg/dL (ref 0.61–1.24)
GFR, Estimated: >60 mL/min (ref >60)
Glucose, Bld: 140 mg/dL — ABNORMAL HIGH (ref 70–99)
Potassium: 4.1 mmol/L (ref 3.5–5.1)
Sodium: 138 mmol/L (ref 135–145)
Total Bilirubin: 1.4 mg/dL — ABNORMAL HIGH (ref 0.3–1.2)
Total Protein: 5.9 g/dL — ABNORMAL LOW (ref 6.5–8.1)

## 2021-02-13 LAB — POCT ACTIVATED CLOTTING TIME
Activated Clotting Time: 291 seconds
Activated Clotting Time: 338 seconds
Activated Clotting Time: 172 seconds

## 2021-02-13 LAB — APTT: aPTT: 35 seconds (ref 24–36)

## 2021-02-13 LAB — CBC
HCT: 46 % (ref 39.0–52.0)
Hemoglobin: 15.4 g/dL (ref 13.0–17.0)
MCH: 29.2 pg (ref 26.0–34.0)
MCHC: 33.5 g/dL (ref 30.0–36.0)
MCV: 87.3 fL (ref 80.0–100.0)
Platelets: 213 10*3/uL (ref 150–400)
RBC: 5.27 MIL/uL (ref 4.22–5.81)
RDW: 14.7 % (ref 11.5–15.5)
WBC: 9 10*3/uL (ref 4.0–10.5)
nRBC: 0 % (ref 0.0–0.2)

## 2021-02-13 LAB — GLUCOSE, CAPILLARY: Glucose-Capillary: 120 mg/dL — ABNORMAL HIGH (ref 70–99)

## 2021-02-13 LAB — HEMOGLOBIN A1C
Hgb A1c MFr Bld: 6 % — ABNORMAL HIGH (ref 4.8–5.6)
Mean Plasma Glucose: 125.5 mg/dL

## 2021-02-13 LAB — TROPONIN I (HIGH SENSITIVITY): Troponin I (High Sensitivity): 644 ng/L (ref <18)

## 2021-02-13 LAB — MRSA PCR SCREENING: MRSA by PCR: NEGATIVE

## 2021-02-13 SURGERY — CORONARY/GRAFT ACUTE MI REVASCULARIZATION
Anesthesia: LOCAL

## 2021-02-13 MED ORDER — CLOPIDOGREL BISULFATE 300 MG PO TABS
ORAL_TABLET | ORAL | Status: AC
  Filled 2021-02-13: qty 2

## 2021-02-13 MED ORDER — PANTOPRAZOLE SODIUM 40 MG PO TBEC
40.0000 mg | DELAYED_RELEASE_TABLET | Freq: Every day | ORAL | Status: DC
  Administered 2021-02-13 – 2021-02-17 (×5): 40 mg via ORAL
  Filled 2021-02-13 (×5): qty 1

## 2021-02-13 MED ORDER — HEPARIN (PORCINE) 25000 UT/250ML-% IV SOLN
1800.0000 [IU]/h | INTRAVENOUS | Status: DC
  Administered 2021-02-13: 1100 [IU]/h via INTRAVENOUS
  Administered 2021-02-14: 1300 [IU]/h via INTRAVENOUS
  Administered 2021-02-15 (×2): 1800 [IU]/h via INTRAVENOUS
  Filled 2021-02-13 (×6): qty 250

## 2021-02-13 MED ORDER — SODIUM CHLORIDE 0.9 % IV SOLN: INTRAVENOUS | Status: AC

## 2021-02-13 MED ORDER — IOHEXOL 350 MG/ML SOLN
INTRAVENOUS | Status: DC | PRN
  Administered 2021-02-13: 200 mL via INTRA_ARTERIAL

## 2021-02-13 MED ORDER — ATROPINE SULFATE 1 MG/10ML IJ SOSY
PREFILLED_SYRINGE | INTRAMUSCULAR | Status: AC
  Filled 2021-02-13: qty 10

## 2021-02-13 MED ORDER — LABETALOL HCL 5 MG/ML IV SOLN: 10.0000 mg | INTRAVENOUS | Status: AC | PRN

## 2021-02-13 MED ORDER — SACUBITRIL-VALSARTAN 24-26 MG PO TABS
1.0000 | ORAL_TABLET | Freq: Two times a day (BID) | ORAL | Status: DC
  Filled 2021-02-13: qty 1

## 2021-02-13 MED ORDER — FENTANYL CITRATE (PF) 100 MCG/2ML IJ SOLN
INTRAMUSCULAR | Status: DC | PRN
  Administered 2021-02-13: 25 ug via INTRAVENOUS

## 2021-02-13 MED ORDER — CLOPIDOGREL BISULFATE 300 MG PO TABS
ORAL_TABLET | ORAL | Status: DC | PRN
  Administered 2021-02-13: 600 mg via ORAL

## 2021-02-13 MED ORDER — MIDAZOLAM HCL 2 MG/2ML IJ SOLN
INTRAMUSCULAR | Status: DC | PRN
  Administered 2021-02-13: 1 mg via INTRAVENOUS

## 2021-02-13 MED ORDER — HYDRALAZINE HCL 20 MG/ML IJ SOLN: 10.0000 mg | INTRAMUSCULAR | Status: AC | PRN

## 2021-02-13 MED ORDER — NITROGLYCERIN 1 MG/10 ML FOR IR/CATH LAB
INTRA_ARTERIAL | Status: DC | PRN
  Administered 2021-02-13 (×2): 100 ug via INTRACORONARY

## 2021-02-13 MED ORDER — SODIUM CHLORIDE 0.9% FLUSH
3.0000 mL | Freq: Two times a day (BID) | INTRAVENOUS | Status: DC
  Administered 2021-02-13 – 2021-02-15 (×6): 3 mL via INTRAVENOUS

## 2021-02-13 MED ORDER — CLOPIDOGREL BISULFATE 75 MG PO TABS
75.0000 mg | ORAL_TABLET | Freq: Every day | ORAL | Status: DC
  Administered 2021-02-14 – 2021-02-17 (×4): 75 mg via ORAL
  Filled 2021-02-13 (×4): qty 1

## 2021-02-13 MED ORDER — LIDOCAINE HCL (PF) 1 % IJ SOLN
INTRAMUSCULAR | Status: DC | PRN
  Administered 2021-02-13: 5 mL via SUBCUTANEOUS
  Administered 2021-02-13: 15 mL via SUBCUTANEOUS

## 2021-02-13 MED ORDER — ACETAMINOPHEN 325 MG PO TABS
650.0000 mg | ORAL_TABLET | ORAL | Status: DC | PRN
  Administered 2021-02-16 (×2): 650 mg via ORAL
  Filled 2021-02-13 (×3): qty 2

## 2021-02-13 MED ORDER — HEPARIN SODIUM (PORCINE) 1000 UNIT/ML IJ SOLN
INTRAMUSCULAR | Status: DC | PRN
  Administered 2021-02-13: 9000 [IU] via INTRAVENOUS
  Administered 2021-02-13: 2000 [IU] via INTRAVENOUS

## 2021-02-13 MED ORDER — SODIUM CHLORIDE 0.9 % IV SOLN
INTRAVENOUS | Status: AC | PRN
Start: 2021-02-13 — End: 2021-02-13
  Administered 2021-02-13: 10 mL/h via INTRAVENOUS

## 2021-02-13 MED ORDER — NITROGLYCERIN 1 MG/10 ML FOR IR/CATH LAB
INTRA_ARTERIAL | Status: AC
  Filled 2021-02-13: qty 10

## 2021-02-13 MED ORDER — FENTANYL CITRATE (PF) 100 MCG/2ML IJ SOLN
INTRAMUSCULAR | Status: AC
  Filled 2021-02-13: qty 2

## 2021-02-13 MED ORDER — HEPARIN SODIUM (PORCINE) 1000 UNIT/ML IJ SOLN
INTRAMUSCULAR | Status: AC
  Filled 2021-02-13: qty 1

## 2021-02-13 MED ORDER — SODIUM CHLORIDE 0.9% FLUSH: 3.0000 mL | INTRAVENOUS | Status: DC | PRN

## 2021-02-13 MED ORDER — HEPARIN (PORCINE) IN NACL 1000-0.9 UT/500ML-% IV SOLN
INTRAVENOUS | Status: AC
  Filled 2021-02-13: qty 1000

## 2021-02-13 MED ORDER — PERFLUTREN LIPID MICROSPHERE
1.0000 mL | INTRAVENOUS | Status: AC | PRN
  Administered 2021-02-13: 2 mL via INTRAVENOUS
  Filled 2021-02-13: qty 10

## 2021-02-13 MED ORDER — CARVEDILOL 6.25 MG PO TABS: 6.2500 mg | ORAL_TABLET | Freq: Two times a day (BID) | ORAL | Status: DC

## 2021-02-13 MED ORDER — HEPARIN (PORCINE) IN NACL 1000-0.9 UT/500ML-% IV SOLN
INTRAVENOUS | Status: DC | PRN
  Administered 2021-02-13 (×2): 500 mL

## 2021-02-13 MED ORDER — SODIUM CHLORIDE 0.9 % IV SOLN
250.0000 mL | INTRAVENOUS | Status: DC | PRN
Start: 2021-02-13 — End: 2021-02-17

## 2021-02-13 MED ORDER — VERAPAMIL HCL 2.5 MG/ML IV SOLN
INTRAVENOUS | Status: AC
  Filled 2021-02-13: qty 2

## 2021-02-13 MED ORDER — LIDOCAINE HCL (PF) 1 % IJ SOLN
INTRAMUSCULAR | Status: AC
  Filled 2021-02-13: qty 30

## 2021-02-13 MED ORDER — CARVEDILOL 3.125 MG PO TABS
3.1250 mg | ORAL_TABLET | Freq: Two times a day (BID) | ORAL | Status: DC
  Administered 2021-02-13 – 2021-02-17 (×7): 3.125 mg via ORAL
  Filled 2021-02-13 (×7): qty 1

## 2021-02-13 MED ORDER — CHLORHEXIDINE GLUCONATE CLOTH 2 % EX PADS
6.0000 | MEDICATED_PAD | Freq: Every day | CUTANEOUS | Status: DC
  Administered 2021-02-13 – 2021-02-15 (×3): 6 via TOPICAL

## 2021-02-13 MED ORDER — IOHEXOL 350 MG/ML SOLN
INTRAVENOUS | Status: AC
  Filled 2021-02-13: qty 1

## 2021-02-13 MED ORDER — MIDAZOLAM HCL 2 MG/2ML IJ SOLN
INTRAMUSCULAR | Status: AC
  Filled 2021-02-13: qty 2

## 2021-02-13 MED ORDER — ATORVASTATIN CALCIUM 80 MG PO TABS
80.0000 mg | ORAL_TABLET | Freq: Every day | ORAL | Status: DC
  Administered 2021-02-13 – 2021-02-17 (×5): 80 mg via ORAL
  Filled 2021-02-13 (×5): qty 1

## 2021-02-13 MED ORDER — ONDANSETRON HCL 4 MG/2ML IJ SOLN
4.0000 mg | Freq: Four times a day (QID) | INTRAMUSCULAR | Status: DC | PRN
  Administered 2021-02-16: 4 mg via INTRAVENOUS
  Filled 2021-02-13: qty 2

## 2021-02-13 SURGICAL SUPPLY — 26 items
BALLN SAPPHIRE 2.5X12 (BALLOONS) ×2
BALLN ~~LOC~~ EMERGE MR 3.5X12 (BALLOONS) ×2
BALLOON SAPPHIRE 2.5X12 (BALLOONS) ×1 IMPLANT
BALLOON ~~LOC~~ EMERGE MR 3.5X12 (BALLOONS) ×1 IMPLANT
CATH INFINITI 5FR JL5 (CATHETERS) ×2 IMPLANT
CATH INFINITI 5FR MULTPACK ANG (CATHETERS) ×2 IMPLANT
CATH LAUNCHER 6FR 3DRC (CATHETERS) ×1 IMPLANT
CATH LAUNCHER 6FR JR4 (CATHETERS) ×2 IMPLANT
CATHETER LAUNCHER 6FR 3DRC (CATHETERS) ×2
DEVICE RAD COMP TR BAND LRG (VASCULAR PRODUCTS) ×2 IMPLANT
ELECT DEFIB PAD ADLT CADENCE (PAD) ×2 IMPLANT
GLIDESHEATH SLEND SS 6F .021 (SHEATH) ×2 IMPLANT
GUIDEWIRE INQWIRE 1.5J.035X260 (WIRE) ×1 IMPLANT
INQWIRE 1.5J .035X260CM (WIRE) ×2
KIT ENCORE 26 ADVANTAGE (KITS) ×2 IMPLANT
KIT HEART LEFT (KITS) ×2 IMPLANT
KIT HEMO VALVE WATCHDOG (MISCELLANEOUS) ×2 IMPLANT
KIT MICROPUNCTURE NIT STIFF (SHEATH) ×2 IMPLANT
PACK CARDIAC CATHETERIZATION (CUSTOM PROCEDURE TRAY) ×2 IMPLANT
SHEATH PINNACLE 6F 10CM (SHEATH) ×2 IMPLANT
SHEATH PROBE COVER 6X72 (BAG) ×2 IMPLANT
STENT RESOLUTE ONYX 3.5X18 (Permanent Stent) ×2 IMPLANT
TRANSDUCER W/STOPCOCK (MISCELLANEOUS) ×2 IMPLANT
TUBING CIL FLEX 10 FLL-RA (TUBING) ×2 IMPLANT
WIRE COUGAR XT STRL 190CM (WIRE) ×2 IMPLANT
WIRE EMERALD 3MM-J .035X150CM (WIRE) ×2 IMPLANT

## 2021-02-13 NOTE — Progress Notes (Signed)
  Echocardiogram 2D Echocardiogram has been performed.  Janalyn Harder 02/13/2021, 3:28 PM

## 2021-02-13 NOTE — H&P (Signed)
Cardiology Admission History and Physical:   Patient ID: [name] MRN: [id number]; DOB: Feb 13, 1941   Admission date: 02/13/2021  PCP:  Medicine, [facility]  Cardiologist:  [name], MD  Advanced Practice Provider:  No care team member to display Electrophysiologist:  [name], MD        Chief Complaint:  CP/STEMI  Patient Profile:   [name] is a 80 y.o. 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   History of Present Illness:   [name] is a 80 y.o. 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 Notes indicate LHC was planned, but pt refused to stay for the procedure and left AMA.  Pt has h/o afib w/ several unsuccessful attempts at DCCV, most recently last week. He was previously on amiodarone, but that had to be stopped due to pulmonary toxicity. He has been rate-controlled w/ coreg. He was recently changed over from coumadin to eliquis when at [facility]. He presented to [name] earlier this evening c/o 2 days of CP. He states the pain has been occurring basically since he left [name] AMA on 02-10-21. Pain described as "crushing" MS discomfort that is associated with diaphoresis, lightheadedness, SOB and nausea. The pain became continuous and escalated in severity around 11pm on 02-12-21, and pt decided to head to ED at [name] for further evaluation. EKG at [name] showed afib w/ RVR, LBBB, and ST elevation in aVR, III, and V3. Pt transferred to [name] urgently for LHC given STEMI.    Past Medical History:  Diagnosis Date  . Anginal pain (HCC)   . Asthma   . Bronchitis   .  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 History of epididymitis   . History of recurrent UTIs   . History of ruptured appendix   . Paroxysmal atrial fibrillation (HCC)   . Peptic ulcer disease   . Pneumonia due to COVID-19 virus   . PONV (postoperative nausea and vomiting)   . Presence of permanent cardiac pacemaker   . Tachy-brady syndrome (HCC)    PPM (SJM) implanted by Dr Graciela Husbands 10/19/99, gen change 04/03/08    Past Surgical History:  Procedure Laterality Date  . APPENDECTOMY    . CARDIOVERSION [name] 02/05/2021   Procedure: CARDIOVERSION;  Surgeon: Jonelle Sidle, MD;  Location: [facility];  Service: Cardiovascular;  Laterality: [name];  . EP IMPLANTABLE DEVICE [name] 11/17/2016   Procedure: PPM Generator Changeout;  Surgeon: Marinus Maw, MD;  Location: [facility] INVASIVE CV LAB;  Service: Cardiovascular;  Laterality: [name];  . INSERT / REPLACE / REMOVE PACEMAKER    . PACEMAKER PLACEMENT  03/2008   [facility], [place]. 5826     Medications Prior to Admission: Prior to Admission medications   Medication Sig Start Date End Date Taking? Authorizing Provider  acetaminophen (TYLENOL) 325 MG tablet Take 2 tablets (650 mg total) by mouth every 6 (six) hours as needed for mild pain, fever or headache (T over 101). 03/08/20   Elgergawy, Leana Roe, MD  carvedilol (COREG) 6.25 MG tablet Take 1 tablet (6.25 mg total) by mouth 2 (two) times daily. 06/17/20  Jonelle Sidle, MD  furosemide (LASIX) 40 MG tablet Take 1 tablet (40 mg total) by mouth daily. Patient taking differently: Take 40 mg by mouth as needed for fluid or edema. 06/17/20   Jonelle Sidle, MD  ibuprofen (ADVIL) 400 MG tablet Take 400 mg by mouth as needed for fever, headache or mild pain. 06/29/20   [provider]  sacubitril-valsartan (ENTRESTO) 24-26 MG Take 1 tablet by mouth 2 (two) times daily. 12/17/20   Jonelle Sidle, MD  warfarin (COUMADIN) 2.5 MG tablet Take 1 1/2 tablets daily except 1 tablet on Mondays  and Thursdays or as directed 12/01/20   Jonelle Sidle, MD     Allergies:    Allergies  Allergen Reactions  . Bee Venom Shortness Of Breath    BEE STINGS - dizziness   . Corn-Containing Products Anaphylaxis    Corn bread, pop corn----tylenol is OK Confirmed with pt that he avoids all corn containing products. SS 04/30/20 Corn bread, pop corn----tylenol is OK   . Citrullus Vulgaris Other (See Comments)    Other reaction(s): GI Upset (intolerance)   . Codeine     REACTION: muscle cramps  . Invert Sugar   . Rice   . Vanilla Diarrhea  . Watermelon Flavor     GI problems    Social History:   Social History   Socioeconomic History  . Marital status: Divorced    Spouse name: Not on file  . Number of children: Not on file  . Years of education: Not on file  . Highest education level: Not on file  Occupational History  . Not on file  Tobacco Use  . Smoking status: Former Smoker    Packs/day: 1.50    Years: 20.00    Pack years: 30.00    Types: Cigarettes    Start date: 08/20/1959    Quit date: 11/30/1977    Years since quitting: 43.2  . Smokeless tobacco: Never Used  Vaping Use  . Vaping Use: Never used  Substance and Sexual Activity  . Alcohol use: No    Alcohol/week: 0.0 standard drinks  . Drug use: No  . Sexual activity: Not Currently  Other Topics Concern  . Not on file  Social History Narrative  . Not on file   Social Determinants of Health   Financial Resource Strain: Not on file  Food Insecurity: Not on file  Transportation Needs: Not on file  Physical Activity: Not on file  Stress: Not on file  Social Connections: Not on file  Intimate Partner Violence: Not on file    Family History:   The patient's family history includes Cancer in his father; Suicidality in his mother.    ROS:  Please see the history of present illness.  All other ROS reviewed and negative.     Physical Exam/Data:   Vitals:   02/13/21 0331  SpO2: 100%   No intake or output  data in the 24 hours ending 02/13/21 0332 Last 3 Weights 02/04/2021 01/08/2021 08/12/2020  Weight (lbs) 212 lb 224 lb 220 lb  Weight (kg) 96.163 kg 101.606 kg 99.791 kg    BP 136/87, HR 102 at [facility] There is no height or weight on file to calculate BMI.  General:  Well nourished, well developed, in no acute distress HEENT: normal Lymph: no adenopathy Neck: no JVD Endocrine:  No thryomegaly Vascular: No carotid bruits; DP pulses not palpable Cardiac:  normal S1, S2; irreg irreg, tachy; no murmur  Lungs:  clear to auscultation bilaterally anteriorly  Abd: soft, nontender, no hepatomegaly  Ext: no edema Musculoskeletal:  No deformities Skin: warm and dry  Neuro:  no focal abnormalities noted Psych:  Normal affect    EKG:  The ECG that was done 02-13-21 was personally reviewed and demonstrates afib w/ RVR, HR 102, rightward axis, LBBB, ST elevation in III, aVR, V3  Relevant CV Studies: 03-04-20 TTE 1. Left ventricular ejection fraction, by estimation, is 25 to 30%. The  left ventricle has severely decreased function. The left ventricle  demonstrates global hypokinesis. Left ventricular diastolic function could  not be evaluated.  2. Right ventricular systolic function was not well visualized. The right  ventricular size is normal. There is normal pulmonary artery systolic  pressure. The estimated right ventricular systolic pressure is 21.3 mmHg.  3. Left atrial size was mildly dilated.  4. The mitral valve is normal in structure. Trivial mitral valve  regurgitation.  5. The aortic valve is normal in structure. Aortic valve regurgitation is  not visualized.  6. The inferior vena cava is normal in size with greater than 50%  respiratory variability, suggesting right atrial pressure of 3 mmHg.   Comparison(s): Prior images reviewed side by side. Changes from prior  study are noted. The left ventricular function is worsened.   Laboratory Data:  High Sensitivity Troponin:   No results for input(s): TROPONINIHS in the last 720 hours.    ChemistryNo results for input(s): NA, K, CL, CO2, GLUCOSE, BUN, CREATININE, CALCIUM, GFRNONAA, GFRAA, ANIONGAP in the last 168 hours.  No results for input(s): PROT, ALBUMIN, AST, ALT, ALKPHOS, BILITOT in the last 168 hours. HematologyNo results for input(s): WBC, RBC, HGB, HCT, MCV, MCH, MCHC, RDW, PLT in the last 168 hours. BNPNo results for input(s): BNP, PROBNP in the last 168 hours.  DDimer No results for input(s): DDIMER in the last 168 hours.   Radiology/Studies:  No results found.   Assessment and Plan:   1. CP/STEMI: emergent LHC for further evaluation.  2. Afib: currently in RVR; will resume BB for rate control. Pt has been on eliquis; will plan to restart [facility] post-cath for CVA prophylaxis.  3. 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 Will restart home meds/GDMT (pt on entresto, coreg PTA) and make adjustments accordingly 4. PPM: [facility]. Follows w/ EP outpt   Risk Assessment/Risk Scores:     TIMI Risk Score for ST  Elevation MI:   The patient's TIMI risk score is 8, which indicates a 26.8% risk of all cause mortality at 30 days.   New York Heart Association (NYHA) Functional Class NYHA Class II  CHA2DS2-VASc Score = 3  This indicates a 3.2% annual risk of stroke. The patient's score is based upon: CHF History: Yes HTN History: No Diabetes History: No Stroke History: No Vascular Disease History: No      For questions or updates, please contact [facility] Please consult [email or web address] for contact info under     Signed, Precious Reel, MD, [facility] 02/13/2021 3:32 AM

## 2021-02-13 NOTE — Plan of Care (Signed)
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
## 2021-02-13 NOTE — Progress Notes (Signed)
ANTICOAGULATION CONSULT NOTE - Initial Consult  Pharmacy Consult for Heparin drip Indication: chest pain/ACS and atrial fibrillation  Allergies  Allergen Reactions  . Bee Venom Shortness Of Breath    BEE STINGS - dizziness   . Corn-Containing Products Anaphylaxis    Confirmed with pt that he avoids all corn containing products. SS 04/30/20   . Citrullus Vulgaris Other (See Comments)    Other reaction(s): GI Upset (intolerance)   . Codeine Other (See Comments)    REACTION: muscle cramps  . Invert Sugar Other (See Comments)    Unknown reaction  . Rice Other (See Comments)    Unknown reaction  . Vanilla Diarrhea    Patient Measurements: Height: 5\' 10"  (177.8 cm) Weight: 98.7 kg (217 lb 9.5 oz) IBW/kg (Calculated) : 73   Vital Signs: Temp: 97.6 F (36.4 C) (03/18 1100) Temp Source: Axillary (03/18 1100) BP: 80/59 (03/18 1333) Pulse Rate: 69 (03/18 1333)  Labs: Recent Labs    02/13/21 0422 02/13/21 0425  HGB 16.3 15.4  HCT 48.0 46.0  PLT  --  213  APTT  --  35  LABPROT  --  17.7*  INR  --  1.5*  CREATININE 0.90 1.09  TROPONINIHS  --  644*    Estimated Creatinine Clearance: 66.9 mL/min (by C-G formula based on SCr of 1.09 mg/dL).   Medical History: Past Medical History:  Diagnosis Date  . Anginal pain (HCC)   . Asthma   . Bronchitis   . 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 History of epididymitis   . History of recurrent UTIs   . History of ruptured appendix   . Paroxysmal atrial fibrillation (HCC)   . Peptic ulcer disease   . Pneumonia due to COVID-19 virus   . PONV (postoperative nausea and vomiting)   . Presence of permanent cardiac pacemaker   . Tachy-brady syndrome (HCC)    PPM (SJM) implanted by Dr 02/15/21 10/19/99, gen change 04/03/08      Assessment: 79yom admitted for STEMI s/p DES with plans to go back to cath lab for staged PCI next week.  Afib on warfarin PTA held the last week INR 1.5 Will start heparin drip while off  oral anticoagulation.  Sheath removed at 11am begin heparin at 6pm  Goal of Therapy:  Heparin level 0.3-0.7 units/ml Monitor platelets by anticoagulation protocol: Yes   Plan:  Heparin drip 1100 uts/hr  Daily heparin level and CBC     06/03/08 Pharm.D. CPP, BCPS Clinical Pharmacist [phone] 02/13/2021 4:08 PM

## 2021-02-14 DIAGNOSIS — I4821 Permanent atrial fibrillation: Secondary | ICD-10-CM

## 2021-02-14 DIAGNOSIS — I429 Cardiomyopathy, unspecified: Secondary | ICD-10-CM

## 2021-02-14 LAB — HEPARIN LEVEL (UNFRACTIONATED)
Heparin Unfractionated: 0.2 IU/mL — ABNORMAL LOW (ref 0.30–0.70)
Heparin Unfractionated: 0.17 IU/mL — ABNORMAL LOW (ref 0.30–0.70)

## 2021-02-14 LAB — CBC
HCT: 44.6 % (ref 39.0–52.0)
Hemoglobin: 14.8 g/dL (ref 13.0–17.0)
MCH: 28.7 pg (ref 26.0–34.0)
MCHC: 33.2 g/dL (ref 30.0–36.0)
MCV: 86.6 fL (ref 80.0–100.0)
Platelets: 193 10*3/uL (ref 150–400)
RBC: 5.15 MIL/uL (ref 4.22–5.81)
RDW: 14.8 % (ref 11.5–15.5)
WBC: 8.5 10*3/uL (ref 4.0–10.5)
nRBC: 0 % (ref 0.0–0.2)

## 2021-02-14 LAB — BASIC METABOLIC PANEL
Anion gap: 8 (ref 5–15)
BUN: 19 mg/dL (ref 8–23)
CO2: 22 mmol/L (ref 22–32)
Calcium: 9 mg/dL (ref 8.9–10.3)
Chloride: 108 mmol/L (ref 98–111)
Creatinine, Ser: 1.02 mg/dL (ref 0.61–1.24)
GFR, Estimated: >60 mL/min (ref >60)
Glucose, Bld: 118 mg/dL — ABNORMAL HIGH (ref 70–99)
Potassium: 3.8 mmol/L (ref 3.5–5.1)
Sodium: 138 mmol/L (ref 135–145)

## 2021-02-14 NOTE — Progress Notes (Signed)
ANTICOAGULATION CONSULT NOTE  Pharmacy Consult for Heparin drip Indication: chest pain/ACS and atrial fibrillation  Allergies  Allergen Reactions  . Bee Venom Shortness Of Breath    BEE STINGS - dizziness   . Corn-Containing Products Anaphylaxis    Confirmed with pt that he avoids all corn containing products. SS 04/30/20   . Citrullus Vulgaris Other (See Comments)    Other reaction(s): GI Upset (intolerance)   . Codeine Other (See Comments)    REACTION: muscle cramps  . Invert Sugar Other (See Comments)    Unknown reaction  . Rice Other (See Comments)    Unknown reaction  . Vanilla Diarrhea    Patient Measurements: Height: 5\' 10"  (177.8 cm) Weight: 98.7 kg (217 lb 9.5 oz) IBW/kg (Calculated) : 73  Heparin dosing wt: 94 kg   Vital Signs: Temp: 97.9 F (36.6 C) (03/18 1900) Temp Source: Oral (03/18 1900) BP: 92/69 (03/19 0107) Pulse Rate: 84 (03/19 0107)  Labs: Recent Labs    02/13/21 0422 02/13/21 0425 02/14/21 0046  HGB 16.3 15.4 14.8  HCT 48.0 46.0 44.6  PLT  --  213 193  APTT  --  35  --   LABPROT  --  17.7*  --   INR  --  1.5*  --   HEPARINUNFRC  --   --  0.20*  CREATININE 0.90 1.09 1.02  TROPONINIHS  --  644*  --     Estimated Creatinine Clearance: 71.5 mL/min (by C-G formula based on SCr of 1.02 mg/dL).  Assessment: 79yom admitted for STEMI s/p DES with plans to go back to cath lab for staged PCI next week.  Afib on warfarin PTA held the last week INR 1.5. Will start heparin drip while off oral anticoagulation.   Heparin level subtherapeutic (0.2) on gtt at 1100 units/hr. No issues with line or bleeding reported per RN.  Goal of Therapy:  Heparin level 0.3-0.7 units/ml Monitor platelets by anticoagulation protocol: Yes   Plan:  Increase heparin drip to 1300 uts/hr  Will f/u 8 hr heparin level  02/16/21, PharmD, BCPS Please see amion for complete clinical pharmacist phone list 02/14/2021 1:35 AM

## 2021-02-14 NOTE — Progress Notes (Signed)
CARDIAC REHAB PHASE I   PRE:  Rate/Rhythm: Afib 130-150s    BP: sitting 124/109    SaO2: 100  0850-0930 Patient sitting in bed upon arrival. Afib with RVR noted on cardiac monitor. Patient asymptomatic. Patient complain of mild SOB per primary RN. Patient assisted to recliner. C/O chronic lower back pain. Provided patient with CHF and MI booklet to review. Staged PCI Monday. Stent card from cath 02/13/21 given to patient.   Xiomar Crompton Hoover Brunette RN, BSN

## 2021-02-14 NOTE — Progress Notes (Signed)
Progress Note  Patient Name: Cameron KluverJerry A Isais Date of Encounter: 02/14/2021  Primary Cardiologist: Nona DellSamuel Cherylynn Liszewski, MD  Subjective   No chest pain or shortness of breath at rest.  Not happy with the food he received today for breakfast, states that he wants eggs and sausage.  Inpatient Medications    Scheduled Meds: . atorvastatin  80 mg Oral Daily  . carvedilol  3.125 mg Oral BID  . Chlorhexidine Gluconate Cloth  6 each Topical Daily  . clopidogrel  75 mg Oral Q breakfast  . pantoprazole  40 mg Oral Q0600  . sodium chloride flush  3 mL Intravenous Q12H   Continuous Infusions: . sodium chloride    . heparin 1,300 Units/hr (02/14/21 0310)   PRN Meds: sodium chloride, acetaminophen, ondansetron (ZOFRAN) IV, sodium chloride flush   Vital Signs    Vitals:   02/14/21 0445 02/14/21 0500 02/14/21 0600 02/14/21 0700  BP:  (!) 86/70 96/67   Pulse:  (!) 109 (!) 112   Resp:  14 (!) 21   Temp: 98.1 F (36.7 C)   (!) 97.4 F (36.3 C)  TempSrc: Oral     SpO2:  93% 100%   Weight:      Height:        Intake/Output Summary (Last 24 hours) at 02/14/2021 0804 Last data filed at 02/13/2021 1200 Gross per 24 hour  Intake 90 ml  Output 250 ml  Net -160 ml   Filed Weights   02/13/21 0545  Weight: 98.7 kg    Telemetry    Atrial fibrillation.  Personally reviewed.  ECG    An ECG dated 02/14/2021 was personally reviewed today and demonstrated:  Atrial fibrillation with left bundle branch block and recent inferior infarct pattern.  Physical Exam   GEN: No acute distress.   Neck: No JVD. Cardiac:  Irregularly irregular, no gallop.  Respiratory: Nonlabored.  Decreased breath sounds at the bases. GI: Soft, nontender, bowel sounds present. MS: No edema; No deformity. Neuro:  Nonfocal. Psych: Alert and oriented x 3. Normal affect.  Labs    Chemistry Recent Labs  Lab 02/13/21 0422 02/13/21 0425 02/14/21 0046  NA 141 138 138  K 4.1 4.1 3.8  CL 106 108 108  CO2  --   21* 22  GLUCOSE 138* 140* 118*  BUN 24* 22 19  CREATININE 0.90 1.09 1.02  CALCIUM  --  9.0 9.0  PROT  --  5.9*  --   ALBUMIN  --  3.3*  --   AST  --  29  --   ALT  --  30  --   ALKPHOS  --  58  --   BILITOT  --  1.4*  --   GFRNONAA  --  >60 >60  ANIONGAP  --  9 8     Hematology Recent Labs  Lab 02/13/21 0422 02/13/21 0425 02/14/21 0046  WBC  --  9.0 8.5  RBC  --  5.27 5.15  HGB 16.3 15.4 14.8  HCT 48.0 46.0 44.6  MCV  --  87.3 86.6  MCH  --  29.2 28.7  MCHC  --  33.5 33.2  RDW  --  14.7 14.8  PLT  --  213 193    Cardiac Enzymes Recent Labs  Lab 02/13/21 0425  TROPONINIHS 644*    Radiology    CARDIAC CATHETERIZATION  Result Date: 02/13/2021  Prox Cx lesion is 80% stenosed.  Prox Cx to Mid Cx lesion is 90% stenosed.  Prox LAD to Mid LAD lesion is 40% stenosed.  Mid LAD-1 lesion is 95% stenosed.  Mid LAD-2 lesion is 20% stenosed.  Prox RCA lesion is 20% stenosed.  Prox RCA to Mid RCA lesion is 100% stenosed.  Post intervention, there is a 0% residual stenosis.  A stent was successfully placed.  ST segment elevation MI with total occlusion of the large mid RCA with thrombus and initial TIMI 0 flow. Severe concomitant multivessel disease with 40% proximal LAD stenosis followed by 90 to 95% stenosis; and 80 and 90% proximal circumflex stenoses. LVEDP 21 mmHg Successful PCI to the totally occluded RCA with ultimate insertion of a 3.5x18 mm Resolute Onyx stent postdilated to 3.55 mm with resumption of brisk TIMI-3 flow resulting in a large dominant RCA with the 100% occlusion being reduced to 0%.  There is mild 30% stenosis in the distal RCA before the PDA takeoff with 30% proximal PDA stenoses. Recommendation: At present we will initiate aspirin/Plavix and hold Eliquis.  The patient will need staged intervention to his LAD and circumflex vessels.  He has reported EF in the 20 to 25% range.  Hopefully LV function may improve following revascularization.  The patient's  warfarin dose had been discontinued at [facility] earlier in the week.  The patient was uncertain when his last dose of Eliquis was but he did not take it this evening.  Continue optimal therapy for HFrEF, and aggressive lipid-lowering therapy with target LDL less than 70.   ECHOCARDIOGRAM COMPLETE  Result Date: 02/13/2021    ECHOCARDIOGRAM REPORT   Patient Name:   [name] Date of Exam: 02/13/2021 Medical Rec #:  [id number]       Height:       70.0 in Accession #:    [id number]      Weight:       217.6 lb Date of Birth:  13-Jun-1941       BSA:          2.163 m Patient Age:    80 years        BP:           80/59 mmHg Patient Gender: M               HR:           103 bpm. Exam Location:  Inpatient Procedure: 2D Echo, Color Doppler, Cardiac Doppler and Intracardiac            Opacification Agent Indications:    R07.9* Chest pain, unspecified; I25.110 Atherosclerotic heart                 disease of native coronary artery with unstable angina pectoris;                 I48.91* Unspeicified atrial fibrillation; I42.9 Cardiomyopathy                 (unspecified)  History:        Patient has prior history of Echocardiogram examinations, most                 recent 03/04/2020. CHF, CAD, Acute MI and Previous Myocardial                 Infarction, Abnormal ECG and Pacemaker, Arrythmias:Atrial                 Fibrillation and LBBB; Signs/Symptoms:Shortness of Breath,  Dyspnea and Chest Pain.  Sonographer:    Sheralyn Boatman RDCS Referring Phys: [id number] [facility]  Sonographer Comments: Technically difficult study due to poor echo windows and patient is morbidly obese. Image acquisition challenging due to patient body habitus. IMPRESSIONS  1. Left ventricular ejection fraction, by estimation, is 25%. The left ventricle has severely decreased function. The left ventricle demonstrates global hypokinesis. There is mild left ventricular hypertrophy. Left ventricular diastolic parameters are indeterminate.  2.  Right ventricular systolic function is severely reduced. The right ventricular size is mildly enlarged. There is normal pulmonary artery systolic pressure.  3. Left atrial size was mildly dilated.  4. Right atrial size was mildly dilated.  5. The mitral valve is grossly normal. Mild mitral valve regurgitation.  6. The aortic valve is grossly normal. Aortic valve regurgitation is not visualized. No aortic stenosis is present.  7. The inferior vena cava is dilated in size with <50% respiratory variability, suggesting right atrial pressure of 15 mmHg. Conclusion(s)/Recommendation(s): No left ventricular mural or apical thrombus/thrombi. Noncontrast images suggest fixed calcified apical structure, which is not seen on contrast images. FINDINGS  Left Ventricle: Left ventricular ejection fraction, by estimation, is 25%. The left ventricle has severely decreased function. The left ventricle demonstrates global hypokinesis. Definity contrast agent was given IV to delineate the left ventricular endocardial borders. The left ventricular internal cavity size was normal in size. There is mild left ventricular hypertrophy. Left ventricular diastolic parameters are indeterminate. Right Ventricle: The right ventricular size is mildly enlarged. Right vetricular wall thickness was not well visualized. Right ventricular systolic function is severely reduced. There is normal pulmonary artery systolic pressure. The tricuspid regurgitant velocity is 1.86 m/s, and with an assumed right atrial pressure of 15 mmHg, the estimated right ventricular systolic pressure is 28.8 mmHg. Left Atrium: Left atrial size was mildly dilated. Right Atrium: Right atrial size was mildly dilated. Pericardium: Trivial pericardial effusion is present. Mitral Valve: The mitral valve is grossly normal. Mild mitral valve regurgitation. Tricuspid Valve: The tricuspid valve is grossly normal. Tricuspid valve regurgitation is trivial. Aortic Valve: The aortic valve  is grossly normal. Aortic valve regurgitation is not visualized. No aortic stenosis is present. Pulmonic Valve: The pulmonic valve was not well visualized. Pulmonic valve regurgitation is trivial. Aorta: The aortic root is normal in size and structure. Venous: The inferior vena cava is dilated in size with less than 50% respiratory variability, suggesting right atrial pressure of 15 mmHg. IAS/Shunts: The interatrial septum was not well visualized. Additional Comments: A device lead is visualized in the right atrium and right ventricle.  LEFT VENTRICLE PLAX 2D LVIDd:         4.50 cm LVIDs:         4.50 cm LV PW:         1.10 cm LV IVS:        1.30 cm LVOT diam:     2.00 cm LV SV:         29 LV SV Index:   13 LVOT Area:     3.14 cm  LV Volumes (MOD) LV vol d, MOD A2C: 113.0 ml LV vol d, MOD A4C: 142.0 ml LV vol s, MOD A2C: 62.5 ml LV vol s, MOD A4C: 106.0 ml LV SV MOD A2C:     50.5 ml LV SV MOD A4C:     142.0 ml LV SV MOD BP:      41.6 ml RIGHT VENTRICLE  IVC RV S prime:     6.39 cm/s  IVC diam: 3.00 cm TAPSE (M-mode): 0.7 cm LEFT ATRIUM             Index       RIGHT ATRIUM           Index LA diam:        4.30 cm 1.99 cm/m  RA Area:     20.20 cm LA Vol (A2C):   42.2 ml 19.51 ml/m RA Volume:   64.10 ml  29.63 ml/m LA Vol (A4C):   47.3 ml 21.86 ml/m LA Biplane Vol: 45.2 ml 20.89 ml/m  AORTIC VALVE             PULMONIC VALVE LVOT Vmax:   75.00 cm/s  PR End Diast Vel: 1.53 msec LVOT Vmean:  46.700 cm/s LVOT VTI:    0.093 m  AORTA Ao Root diam: 3.10 cm Ao Asc diam:  3.00 cm Ao Arch diam: 3.2 cm MITRAL VALVE            TRICUSPID VALVE MV Area (PHT): 4.68 cm TR Peak grad:   13.8 mmHg MV Decel Time: 162 msec TR Vmax:        186.00 cm/s                          SHUNTS                         Systemic VTI:  0.09 m                         Systemic Diam: 2.00 cm Weston Brass MD Electronically signed by Weston Brass MD Signature Date/Time: 02/13/2021/5:40:25 PM    Final     Patient Profile     80 y.o.  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  Mr. Sharp left AMA on 15 March and then presented with escalating symptoms on at roughly 11 PM on 02/12/2021 to [facility] ER-EKG showed A. fib RVR with left on right block and ST elevations in aVR, III and V3. He was brought emergently to [facility] for emergent cardiac catheterization revealing RCA occlusion with severe lesions in the LAD and LCx.  The occluded RCA treated with a DES stent restoring flow, plan was for staged PCI of the other 2 lesions.  Assessment & Plan    1.  Status post inferior STEMI with placement of DES to the mid RCA on March 18.  High-sensitivity troponin I 644.  Currently on Plavix.  Patient reports an aspirin allergy.  2.  Multivessel CAD, residual disease and recent cardiac catheterization including risk 40% and 95% mid LAD, also 80% proximal circumflex and 90% mid circumflex.  Plan is for staged PCI of the LAD and circumflex first of this week.  He remains on heparin at this time.  3.  Persistent/permanent atrial fibrillation.  CHA2DS2-VASc score is 3.  He had been on Eliquis most recently for stroke prophylaxis, ultimately requesting to switch back to Coumadin which  he had been on in the past with follow-up in the [facility] anticoagulation clinic.  4.  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  RV contraction also significantly  reduced.  Currently on low-dose Coreg, low blood pressure limits resumption of Entresto.and would also hold off on Aldactone at this point.  5.  Corn allergy.  Patient unhappy with his food choices per discussion today.  We will ask for formal dietary consultation to help him with reasonable alternatives.  Continue Plavix, Coreg, Lipitor, and heparin.  Plan for staged PCI LAD and circumflex first of this week.  Discussed with nursing.  Follow-up CBC, BMET, and ECG in a.m.  Signed, [name], MD  02/14/2021, 8:04 AM

## 2021-02-14 NOTE — Progress Notes (Signed)
ANTICOAGULATION CONSULT NOTE  Pharmacy Consult for Heparin drip Indication: chest pain/ACS and atrial fibrillation  Allergies  Allergen Reactions  . Bee Venom Shortness Of Breath    BEE STINGS - dizziness   . Corn-Containing Products Anaphylaxis    Confirmed with pt that he avoids all corn containing products. SS 04/30/20   . Citrullus Vulgaris Other (See Comments)    Other reaction(s): GI Upset (intolerance)   . Codeine Other (See Comments)    REACTION: muscle cramps  . Invert Sugar Other (See Comments)    Unknown reaction  . Rice Other (See Comments)    Unknown reaction  . Vanilla Diarrhea    Patient Measurements: Height: 5\' 10"  (177.8 cm) Weight: 98.7 kg (217 lb 9.5 oz) IBW/kg (Calculated) : 73  Heparin dosing wt: 94 kg   Vital Signs: Temp: 97.9 F (36.6 C) (03/19 1100) Temp Source: Oral (03/19 0445) BP: 103/75 (03/19 1430) Pulse Rate: 102 (03/19 1430)  Labs: Recent Labs    02/13/21 0422 02/13/21 0425 02/14/21 0046 02/14/21 0956  HGB 16.3 15.4 14.8  --   HCT 48.0 46.0 44.6  --   PLT  --  213 193  --   APTT  --  35  --   --   LABPROT  --  17.7*  --   --   INR  --  1.5*  --   --   HEPARINUNFRC  --   --  0.20* 0.17*  CREATININE 0.90 1.09 1.02  --   TROPONINIHS  --  644*  --   --     Estimated Creatinine Clearance: 71.5 mL/min (by C-G formula based on SCr of 1.02 mg/dL).  Assessment: 79yom admitted for STEMI s/p DES with plans to go back to cath lab for staged PCI next week.  Afib on warfarin PTA held the last week INR 1.5. Will start heparin drip while off oral anticoagulation.   Heparin level subtherapeutic 0.17 < goal despite increased heparin drip rate 1300 uts/hr this am . No issues with line or bleeding reported per RN. Lab draw heparin in PIV running fine   Goal of Therapy:  Heparin level 0.3-0.7 units/ml Monitor platelets by anticoagulation protocol: Yes   Plan:  Increase heparin drip to 1500 uts/hr  Daily CBC and heparin level     02/16/21 Pharm.D. CPP, BCPS Clinical Pharmacist [phone] 02/14/2021 3:13 PM    Please see amion for complete clinical pharmacist phone list 02/14/2021 3:12 PM

## 2021-02-15 LAB — BASIC METABOLIC PANEL
Anion gap: 10 (ref 5–15)
BUN: 15 mg/dL (ref 8–23)
CO2: 21 mmol/L — ABNORMAL LOW (ref 22–32)
Calcium: 9 mg/dL (ref 8.9–10.3)
Chloride: 106 mmol/L (ref 98–111)
Creatinine, Ser: 1.07 mg/dL (ref 0.61–1.24)
GFR, Estimated: >60 mL/min (ref >60)
Glucose, Bld: 92 mg/dL (ref 70–99)
Potassium: 4.3 mmol/L (ref 3.5–5.1)
Sodium: 137 mmol/L (ref 135–145)

## 2021-02-15 LAB — CBC
HCT: 44.7 % (ref 39.0–52.0)
Hemoglobin: 14.4 g/dL (ref 13.0–17.0)
MCH: 28.8 pg (ref 26.0–34.0)
MCHC: 32.2 g/dL (ref 30.0–36.0)
MCV: 89.4 fL (ref 80.0–100.0)
Platelets: 183 10*3/uL (ref 150–400)
RBC: 5 MIL/uL (ref 4.22–5.81)
RDW: 15 % (ref 11.5–15.5)
WBC: 8.4 10*3/uL (ref 4.0–10.5)
nRBC: 0 % (ref 0.0–0.2)

## 2021-02-15 LAB — HEPARIN LEVEL (UNFRACTIONATED)
Heparin Unfractionated: 0.16 IU/mL — ABNORMAL LOW (ref 0.30–0.70)
Heparin Unfractionated: 0.36 IU/mL (ref 0.30–0.70)

## 2021-02-15 NOTE — Progress Notes (Signed)
Initial Nutrition Assessment  RD working remotely.  DOCUMENTATION CODES:   Obesity unspecified  INTERVENTION:   Pt reports that he IS allergic to corn kernels, corn meal (used to make cornbread), vanilla, apples, white rice, and regular sugar.  Pt states that he is NOT allergic to corn cooking spray or watermelon.  - Liberalize diet to Regular to provide additional food options, verbal with readback order placed per [name]  - RD has ordered meals through breakfast tomorrow (pt declined to order meals any further ahead despite RD encouragement); added "with assist" to pt's meal ordering preferences on [employer]  NUTRITION DIAGNOSIS:   Increased nutrient needs related to chronic illness (CHF) as evidenced by estimated needs.  GOAL:   Patient will meet greater than or equal to 90% of their needs  MONITOR:   PO intake,Weight trends,I & O's  REASON FOR ASSESSMENT:   Consult Assessment of nutrition requirement/status (Comments: Patient with corn allergy and unhappy with current food choices. Please help with reasonable alternatives.)  ASSESSMENT:   80 year old male who presented on 3/18 with NSTEMI. PMH of CHF, Afib, PPM, CM. Pt underwent emergent cardiac catheterization revealing RCA occlusion with severe lesions in the LAD and LCx. The occluded RCA was treated with a DES stent. Plan for staged PCI of the other 2 lesions.   RD consulted for assessment and assistance in ordering meals due to multiple food allergies. Discussed diet liberalization with MD to provide more food options given pt already has significant restrictions. MD agreed and verbal with readback order placed for Regular diet.  RD spoke with RN via phone call. RN reports pt dissatisfied with limited food options. Per notes, pt threatening to leave AMA.  Spoke with pt at length via phone call. Also spoke with [facility] representative as RD was unable to order certain meal items (example: tuna) that did not  contain pt's allergens but were unavailable to order.  Pt reports that he IS allergic to corn kernels, corn meal (used to make cornbread), vanilla, apples, white rice, and regular sugar.  Pt states that he is NOT allergic to corn cooking spray or watermelon.  RD adjusted allergies in [employer] and removed cooking spray allergy. This made many more options available to pt. RD ordered pt's dinner meal for today and breakfast meal for tomorrow. RD offered to order meals further out but pt declined stating "I can't think that far in advance." RD made pt's ordering status "with assist" so that he can have assistance in ordering meals.  At this point pt requested to end phone call as he was having trouble getting comfortable after transitioning from his recliner back to his bed.  Reviewed available weight history in chart. Pt with a weight loss of 7.7 kg since 03/31/20. This is a 7.2% weight loss in 10 months which is not significant for timeframe. Suspect some of weight fluctuations can be attributed to fluid status given CHF diagnosis. Per RN edema assessment, pt with non-pitting edema to BLE. This may be masking additional weight loss.  Meal Completion: 25-50%  Medications reviewed and include: protonix, heparin  Labs reviewed.  UOP: 1800 ml x 24 hours I/O's: -542 ml since admission  NUTRITION - FOCUSED PHYSICAL EXAM:  Unable to complete at this time. RD working remotely.  Diet Order:   Diet Order            Diet regular Room service appropriate? Yes; Fluid consistency: Thin  Diet effective now  EDUCATION NEEDS:   No education needs have been identified at this time  Skin:  Skin Assessment: Reviewed RN Assessment  Last BM:  02/13/21  Height:   Ht Readings from Last 1 Encounters:  02/13/21 5\' 10"  (1.778 m)    Weight:   Wt Readings from Last 1 Encounters:  02/13/21 98.7 kg    BMI:  Body mass index is 31.22 kg/m.  Estimated Nutritional Needs:    Kcal:  2000-2200  Protein:  100-115 grams  Fluid:  1.8 L/day    02/15/21, MS, RD, LDN Inpatient Clinical Dietitian Please see AMiON for contact information.

## 2021-02-15 NOTE — Progress Notes (Signed)
ANTICOAGULATION CONSULT NOTE  Pharmacy Consult for Heparin drip Indication: chest pain/ACS and atrial fibrillation  Allergies  Allergen Reactions  . Bee Venom Shortness Of Breath    BEE STINGS - dizziness   . Corn-Containing Products Anaphylaxis    Confirmed with pt that he avoids all corn containing products. SS 04/30/20 Patient states cooking spray is not an issue  . Citrullus Vulgaris Other (See Comments)    Other reaction(s): GI Upset (intolerance)   . Codeine Other (See Comments)    REACTION: muscle cramps  . Invert Sugar Other (See Comments)    Unknown reaction  . Rice Other (See Comments)    Unknown reaction  . Vanilla Diarrhea    Patient Measurements: Height: 5\' 10"  (177.8 cm) Weight: 98.7 kg (217 lb 9.5 oz) IBW/kg (Calculated) : 73  Heparin dosing wt: 94 kg   Vital Signs: Temp: 97.4 F (36.3 C) (03/20 1100) BP: 104/83 (03/20 0900) Pulse Rate: 77 (03/20 1100)  Labs: Recent Labs    02/13/21 0425 02/13/21 0425 02/14/21 0046 02/14/21 0956 02/15/21 0119 02/15/21 1046  HGB 15.4  --  14.8  --  14.4  --   HCT 46.0  --  44.6  --  44.7  --   PLT 213  --  193  --  183  --   APTT 35  --   --   --   --   --   LABPROT 17.7*  --   --   --   --   --   INR 1.5*  --   --   --   --   --   HEPARINUNFRC  --    < > 0.20* 0.17* 0.16* 0.36  CREATININE 1.09  --  1.02  --  1.07  --   TROPONINIHS 644*  --   --   --   --   --    < > = values in this interval not displayed.    Estimated Creatinine Clearance: 68.1 mL/min (by C-G formula based on SCr of 1.07 mg/dL).  Assessment: [name] admitted for STEMI s/p DES with plans to go back to cath lab for staged PCI next week.  Afib on warfarin PTA held the last week INR 1.5. Will start heparin drip while off oral anticoagulation.   Heparin level 0.38 at therapeutic goal on heparin drip rate 1800 uts/hr this am. No issues with line or bleeding reported per RN. Lab draw heparin in PIV running fine   Goal of Therapy:  Heparin level  0.3-0.7 units/ml Monitor platelets by anticoagulation protocol: Yes   Plan:  Continue heparin drip to 1800 uts/hr  Daily CBC and heparin level     02/17/21 Pharm.D. CPP, BCPS Clinical Pharmacist [phone] 02/15/2021 12:55 PM    Please see amion for complete clinical pharmacist phone list 02/15/2021 12:55 PM

## 2021-02-15 NOTE — Progress Notes (Signed)
ANTICOAGULATION CONSULT NOTE  Pharmacy Consult for Heparin drip Indication: chest pain/ACS and atrial fibrillation  Allergies  Allergen Reactions  . Bee Venom Shortness Of Breath    BEE STINGS - dizziness   . Corn-Containing Products Anaphylaxis    Confirmed with pt that he avoids all corn containing products. SS 04/30/20   . Citrullus Vulgaris Other (See Comments)    Other reaction(s): GI Upset (intolerance)   . Codeine Other (See Comments)    REACTION: muscle cramps  . Invert Sugar Other (See Comments)    Unknown reaction  . Rice Other (See Comments)    Unknown reaction  . Vanilla Diarrhea    Patient Measurements: Height: 5\' 10"  (177.8 cm) Weight: 98.7 kg (217 lb 9.5 oz) IBW/kg (Calculated) : 73  Heparin dosing wt: 94 kg   Vital Signs: Temp: 97.4 F (36.3 C) (03/19 1921) Temp Source: Oral (03/19 1921) BP: 99/82 (03/20 0100) Pulse Rate: 43 (03/20 0100)  Labs: Recent Labs    02/13/21 0422 02/13/21 0425 02/14/21 0046 02/14/21 0956 02/15/21 0119  HGB 16.3 15.4 14.8  --   --   HCT 48.0 46.0 44.6  --   --   PLT  --  213 193  --   --   APTT  --  35  --   --   --   LABPROT  --  17.7*  --   --   --   INR  --  1.5*  --   --   --   HEPARINUNFRC  --   --  0.20* 0.17* 0.16*  CREATININE 0.90 1.09 1.02  --   --   TROPONINIHS  --  644*  --   --   --     Estimated Creatinine Clearance: 71.5 mL/min (by C-G formula based on SCr of 1.02 mg/dL).  Assessment: 79yom admitted for STEMI s/p DES with plans to go back to cath lab for staged PCI next week.  Afib on warfarin PTA held the last week INR 1.5. Will start heparin drip while off oral anticoagulation.   Heparin level subtherapeutic (0.16) on gtt at 1500 units/hr. No issues with line or bleeding reported per RN.  Goal of Therapy:  Heparin level 0.3-0.7 units/ml Monitor platelets by anticoagulation protocol: Yes   Plan:  Increase heparin drip to 1800 uts/hr  Will f/u 8 hr heparin level  02/17/21, PharmD,  BCPS Please see amion for complete clinical pharmacist phone list 02/15/2021 2:56 AM

## 2021-02-15 NOTE — H&P (View-Only) (Signed)
Progress Note  Patient Name: [name] Date of Encounter: 02/15/2021  Primary Cardiologist: [name], MD  Subjective   Overall seems frustrated.  He wants to go home.  He remains unhappy with food choices.  I explained to him that I have requested a dietary consultation given concerns about food allergies.  He does not report any chest pain at this time.  Inpatient Medications    Scheduled Meds: . atorvastatin  80 mg Oral Daily  . carvedilol  3.125 mg Oral BID  . Chlorhexidine Gluconate Cloth  6 each Topical Daily  . clopidogrel  75 mg Oral Q breakfast  . pantoprazole  40 mg Oral Q0600  . sodium chloride flush  3 mL Intravenous Q12H   Continuous Infusions: . sodium chloride    . heparin 1,800 Units/hr (02/15/21 0859)   PRN Meds: sodium chloride, acetaminophen, ondansetron (ZOFRAN) IV, sodium chloride flush   Vital Signs    Vitals:   02/15/21 0400 02/15/21 0500 02/15/21 0600 02/15/21 0700  BP: 112/73 (!) 84/62 (!) 89/78 103/90  Pulse: 65 74 67 (!) 53  Resp: (!) 21 (!) 21 (!) 22 (!) 34  Temp:      TempSrc:      SpO2: 100% 94% 98% 97%  Weight:      Height:        Intake/Output Summary (Last 24 hours) at 02/15/2021 0935 Last data filed at 02/15/2021 0700 Gross per 24 hour  Intake 641.22 ml  Output 1600 ml  Net -958.78 ml   Filed Weights   02/13/21 0545  Weight: 98.7 kg    Telemetry    Atrial fibrillation.  Personally reviewed.  ECG    I personally reviewed his ECG today which shows atrial fibrillation with left bundle branch block.  Physical Exam   GEN: No acute distress.   Neck: No JVD. Cardiac:  Irregularly irregular, no gallop.  Respiratory: Nonlabored.  Decreased breath sounds at the bases. GI: Soft, nontender, bowel sounds present. MS:  No pitting edema. Neuro:  Nonfocal. Psych: Alert and oriented x 3. Normal affect.  Labs    Chemistry Recent Labs  Lab 02/13/21 0425 02/14/21 0046 02/15/21 0119  NA 138 138 137  K 4.1 3.8  4.3  CL 108 108 106  CO2 21* 22 21*  GLUCOSE 140* 118* 92  BUN 22 19 15   CREATININE 1.09 1.02 1.07  CALCIUM 9.0 9.0 9.0  PROT 5.9*  --   --   ALBUMIN 3.3*  --   --   AST 29  --   --   ALT 30  --   --   ALKPHOS 58  --   --   BILITOT 1.4*  --   --   GFRNONAA >60 >60 >60  ANIONGAP 9 8 10      Hematology Recent Labs  Lab 02/13/21 0425 02/14/21 0046 02/15/21 0119  WBC 9.0 8.5 8.4  RBC 5.27 5.15 5.00  HGB 15.4 14.8 14.4  HCT 46.0 44.6 44.7  MCV 87.3 86.6 89.4  MCH 29.2 28.7 28.8  MCHC 33.5 33.2 32.2  RDW 14.7 14.8 15.0  PLT 213 193 183    Cardiac Enzymes Recent Labs  Lab 02/13/21 0425  TROPONINIHS 644*    Radiology    ECHOCARDIOGRAM COMPLETE  Result Date: 02/13/2021    ECHOCARDIOGRAM REPORT   Patient Name:   02/15/21 Date of Exam: 02/13/2021 Medical Rec #:  [name]       Height:  70.0 in Accession #:    [id number]      Weight:       217.6 lb Date of Birth:  02-18-1941       BSA:          2.163 m Patient Age:    80 years        BP:           80/59 mmHg Patient Gender: M               HR:           103 bpm. Exam Location:  Inpatient Procedure: 2D Echo, Color Doppler, Cardiac Doppler and Intracardiac            Opacification Agent Indications:    R07.9* Chest pain, unspecified; I25.110 Atherosclerotic heart                 disease of native coronary artery with unstable angina pectoris;                 I48.91* Unspeicified atrial fibrillation; I42.9 Cardiomyopathy                 (unspecified)  History:        Patient has prior history of Echocardiogram examinations, most                 recent 03/04/2020. CHF, CAD, Acute MI and Previous Myocardial                 Infarction, Abnormal ECG and Pacemaker, Arrythmias:Atrial                 Fibrillation and LBBB; Signs/Symptoms:Shortness of Breath,                 Dyspnea and Chest Pain.  Sonographer:    Sheralyn Boatman RDCS Referring Phys: [id number] DAVID W [facility]  Sonographer Comments: Technically difficult study due to poor echo  windows and patient is morbidly obese. Image acquisition challenging due to patient body habitus. IMPRESSIONS  1. Left ventricular ejection fraction, by estimation, is 25%. The left ventricle has severely decreased function. The left ventricle demonstrates global hypokinesis. There is mild left ventricular hypertrophy. Left ventricular diastolic parameters are indeterminate.  2. Right ventricular systolic function is severely reduced. The right ventricular size is mildly enlarged. There is normal pulmonary artery systolic pressure.  3. Left atrial size was mildly dilated.  4. Right atrial size was mildly dilated.  5. The mitral valve is grossly normal. Mild mitral valve regurgitation.  6. The aortic valve is grossly normal. Aortic valve regurgitation is not visualized. No aortic stenosis is present.  7. The inferior vena cava is dilated in size with <50% respiratory variability, suggesting right atrial pressure of 15 mmHg. Conclusion(s)/Recommendation(s): No left ventricular mural or apical thrombus/thrombi. Noncontrast images suggest fixed calcified apical structure, which is not seen on contrast images. FINDINGS  Left Ventricle: Left ventricular ejection fraction, by estimation, is 25%. The left ventricle has severely decreased function. The left ventricle demonstrates global hypokinesis. Definity contrast agent was given IV to delineate the left ventricular endocardial borders. The left ventricular internal cavity size was normal in size. There is mild left ventricular hypertrophy. Left ventricular diastolic parameters are indeterminate. Right Ventricle: The right ventricular size is mildly enlarged. Right vetricular wall thickness was not well visualized. Right ventricular systolic function is severely reduced. There is normal pulmonary artery systolic pressure. The tricuspid regurgitant velocity is 1.86 m/s, and with an assumed right  atrial pressure of 15 mmHg, the estimated right ventricular systolic pressure  is 28.8 mmHg. Left Atrium: Left atrial size was mildly dilated. Right Atrium: Right atrial size was mildly dilated. Pericardium: Trivial pericardial effusion is present. Mitral Valve: The mitral valve is grossly normal. Mild mitral valve regurgitation. Tricuspid Valve: The tricuspid valve is grossly normal. Tricuspid valve regurgitation is trivial. Aortic Valve: The aortic valve is grossly normal. Aortic valve regurgitation is not visualized. No aortic stenosis is present. Pulmonic Valve: The pulmonic valve was not well visualized. Pulmonic valve regurgitation is trivial. Aorta: The aortic root is normal in size and structure. Venous: The inferior vena cava is dilated in size with less than 50% respiratory variability, suggesting right atrial pressure of 15 mmHg. IAS/Shunts: The interatrial septum was not well visualized. Additional Comments: A device lead is visualized in the right atrium and right ventricle.  LEFT VENTRICLE PLAX 2D LVIDd:         4.50 cm LVIDs:         4.50 cm LV PW:         1.10 cm LV IVS:        1.30 cm LVOT diam:     2.00 cm LV SV:         29 LV SV Index:   13 LVOT Area:     3.14 cm  LV Volumes (MOD) LV vol d, MOD A2C: 113.0 ml LV vol d, MOD A4C: 142.0 ml LV vol s, MOD A2C: 62.5 ml LV vol s, MOD A4C: 106.0 ml LV SV MOD A2C:     50.5 ml LV SV MOD A4C:     142.0 ml LV SV MOD BP:      41.6 ml RIGHT VENTRICLE            IVC RV S prime:     6.39 cm/s  IVC diam: 3.00 cm TAPSE (M-mode): 0.7 cm LEFT ATRIUM             Index       RIGHT ATRIUM           Index LA diam:        4.30 cm 1.99 cm/m  RA Area:     20.20 cm LA Vol (A2C):   42.2 ml 19.51 ml/m RA Volume:   64.10 ml  29.63 ml/m LA Vol (A4C):   47.3 ml 21.86 ml/m LA Biplane Vol: 45.2 ml 20.89 ml/m  AORTIC VALVE             PULMONIC VALVE LVOT Vmax:   75.00 cm/s  PR End Diast Vel: 1.53 msec LVOT Vmean:  46.700 cm/s LVOT VTI:    0.093 m  AORTA Ao Root diam: 3.10 cm Ao Asc diam:  3.00 cm Ao Arch diam: 3.2 cm MITRAL VALVE            TRICUSPID  VALVE MV Area (PHT): 4.68 cm TR Peak grad:   13.8 mmHg MV Decel Time: 162 msec TR Vmax:        186.00 cm/s                          SHUNTS                         Systemic VTI:  0.09 m  Systemic Diam: 2.00 cm Gayatri Acharya MD Electronically signed by Gayatri Acharya MD Signature Date/Time: 02/13/2021/5:40:25 PM    Final     Cardiac Studies    Cardiac Cath-PCI 02/13/2021: Culprit: Proximal-mid RCA 100% (->PCI Resolute Onyx DES 3.5 x 18-3.6 mm, 0% post PCI);, Proximal mid LAD 40% followed by 95% mid LAD.  Proximal LCx 80% followed by proximal to mid 90%. => Plan was aspirin Plavix and hold Eliquis pending staged PCI. Diagnostic : Dominance: Right       Patient Profile     79 y.o. 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  Mr. Tani left AMA on 15 March and then presented with escalating symptoms on at roughly 11 PM on 02/12/2021 to [facility] ER-EKG showed A. fib RVR with left on right block and ST elevations in aVR, III and V3. He was brought emergently to [place] hospital for emergent cardiac catheterization revealing RCA occlusion with severe lesions in the LAD and LCx.  The occluded RCA treated with a DES stent restoring flow, plan was for staged PCI of the other 2 lesions.  Assessment & Plan    1.  Status post inferior STEMI with placement of DES to the mid RCA on March 18.  High-sensitivity troponin I 644.  Currently on Plavix.  Patient reports an aspirin allergy.  He does not report any  angina at this time, ECG is stable with left bundle branch block.  2.  Multivessel CAD, residual disease at recent cardiac catheterization including 40% and 95% mid LAD, also 80% proximal circumflex and 90% mid circumflex.  Plan is for staged PCI of the LAD and circumflex on Monday.  He remains on heparin at this time.  3.  Persistent/permanent atrial fibrillation.  CHA2DS2-VASc score is 3.  He had been on Eliquis most recently for stroke prophylaxis, ultimately requesting to switch back to Coumadin which he had been on in the past with follow-up in the [facility] anticoagulation clinic.  Of note he did undergo recent cardioversion attempt as arranged by the AF clinic, however not successful in restoring sinus rhythm and plan at this point is to continue strategy of heart rate control and anticoagulation.  4.  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  RV contraction also significantly reduced.  Currently on low-dose Coreg.  Given current blood pressure will hold off on resuming Entresto and Aldactone at this point.  5.  Corn allergy.  Patient unhappy with his food choices.  Formal dietary consultation has been requested.  Continue Plavix, Coreg, Lipitor, and heparin.  Plan for staged PCI LAD and circumflex on Monday.  Had long discussion with patient this morning and tried to reassure him.  I encouraged him not to leave the hospital until we are able to further stabilize his cardiac status with planned PCI and further medical therapy adjustments.  Follow-up CBC, BMET, and ECG in a.m.  Signed, Uchenna Rappaport, MD  02/15/2021, 9:35 AM   

## 2021-02-15 NOTE — Progress Notes (Signed)
Progress Note  Patient Name: [name] Date of Encounter: 02/15/2021  Primary Cardiologist: [name], MD  Subjective   Overall seems frustrated.  He wants to go home.  He remains unhappy with food choices.  I explained to him that I have requested a dietary consultation given concerns about food allergies.  He does not report any chest pain at this time.  Inpatient Medications    Scheduled Meds: . atorvastatin  80 mg Oral Daily  . carvedilol  3.125 mg Oral BID  . Chlorhexidine Gluconate Cloth  6 each Topical Daily  . clopidogrel  75 mg Oral Q breakfast  . pantoprazole  40 mg Oral Q0600  . sodium chloride flush  3 mL Intravenous Q12H   Continuous Infusions: . sodium chloride    . heparin 1,800 Units/hr (02/15/21 0859)   PRN Meds: sodium chloride, acetaminophen, ondansetron (ZOFRAN) IV, sodium chloride flush   Vital Signs    Vitals:   02/15/21 0400 02/15/21 0500 02/15/21 0600 02/15/21 0700  BP: 112/73 (!) 84/62 (!) 89/78 103/90  Pulse: 65 74 67 (!) 53  Resp: (!) 21 (!) 21 (!) 22 (!) 34  Temp:      TempSrc:      SpO2: 100% 94% 98% 97%  Weight:      Height:        Intake/Output Summary (Last 24 hours) at 02/15/2021 0935 Last data filed at 02/15/2021 0700 Gross per 24 hour  Intake 641.22 ml  Output 1600 ml  Net -958.78 ml   Filed Weights   02/13/21 0545  Weight: 98.7 kg    Telemetry    Atrial fibrillation.  Personally reviewed.  ECG    I personally reviewed his ECG today which shows atrial fibrillation with left bundle branch block.  Physical Exam   GEN: No acute distress.   Neck: No JVD. Cardiac:  Irregularly irregular, no gallop.  Respiratory: Nonlabored.  Decreased breath sounds at the bases. GI: Soft, nontender, bowel sounds present. MS:  No pitting edema. Neuro:  Nonfocal. Psych: Alert and oriented x 3. Normal affect.  Labs    Chemistry Recent Labs  Lab 02/13/21 0425 02/14/21 0046 02/15/21 0119  NA 138 138 137  K 4.1 3.8  4.3  CL 108 108 106  CO2 21* 22 21*  GLUCOSE 140* 118* 92  BUN 22 19 15   CREATININE 1.09 1.02 1.07  CALCIUM 9.0 9.0 9.0  PROT 5.9*  --   --   ALBUMIN 3.3*  --   --   AST 29  --   --   ALT 30  --   --   ALKPHOS 58  --   --   BILITOT 1.4*  --   --   GFRNONAA >60 >60 >60  ANIONGAP 9 8 10      Hematology Recent Labs  Lab 02/13/21 0425 02/14/21 0046 02/15/21 0119  WBC 9.0 8.5 8.4  RBC 5.27 5.15 5.00  HGB 15.4 14.8 14.4  HCT 46.0 44.6 44.7  MCV 87.3 86.6 89.4  MCH 29.2 28.7 28.8  MCHC 33.5 33.2 32.2  RDW 14.7 14.8 15.0  PLT 213 193 183    Cardiac Enzymes Recent Labs  Lab 02/13/21 0425  TROPONINIHS 644*    Radiology    ECHOCARDIOGRAM COMPLETE  Result Date: 02/13/2021    ECHOCARDIOGRAM REPORT   Patient Name:   02/15/21 Date of Exam: 02/13/2021 Medical Rec #:  [name]       Height:  70.0 in Accession #:    [id number]      Weight:       217.6 lb Date of Birth:  02-18-1941       BSA:          2.163 m Patient Age:    80 years        BP:           80/59 mmHg Patient Gender: M               HR:           103 bpm. Exam Location:  Inpatient Procedure: 2D Echo, Color Doppler, Cardiac Doppler and Intracardiac            Opacification Agent Indications:    R07.9* Chest pain, unspecified; I25.110 Atherosclerotic heart                 disease of native coronary artery with unstable angina pectoris;                 I48.91* Unspeicified atrial fibrillation; I42.9 Cardiomyopathy                 (unspecified)  History:        Patient has prior history of Echocardiogram examinations, most                 recent 03/04/2020. CHF, CAD, Acute MI and Previous Myocardial                 Infarction, Abnormal ECG and Pacemaker, Arrythmias:Atrial                 Fibrillation and LBBB; Signs/Symptoms:Shortness of Breath,                 Dyspnea and Chest Pain.  Sonographer:    Sheralyn Boatman RDCS Referring Phys: [id number] DAVID W [facility]  Sonographer Comments: Technically difficult study due to poor echo  windows and patient is morbidly obese. Image acquisition challenging due to patient body habitus. IMPRESSIONS  1. Left ventricular ejection fraction, by estimation, is 25%. The left ventricle has severely decreased function. The left ventricle demonstrates global hypokinesis. There is mild left ventricular hypertrophy. Left ventricular diastolic parameters are indeterminate.  2. Right ventricular systolic function is severely reduced. The right ventricular size is mildly enlarged. There is normal pulmonary artery systolic pressure.  3. Left atrial size was mildly dilated.  4. Right atrial size was mildly dilated.  5. The mitral valve is grossly normal. Mild mitral valve regurgitation.  6. The aortic valve is grossly normal. Aortic valve regurgitation is not visualized. No aortic stenosis is present.  7. The inferior vena cava is dilated in size with <50% respiratory variability, suggesting right atrial pressure of 15 mmHg. Conclusion(s)/Recommendation(s): No left ventricular mural or apical thrombus/thrombi. Noncontrast images suggest fixed calcified apical structure, which is not seen on contrast images. FINDINGS  Left Ventricle: Left ventricular ejection fraction, by estimation, is 25%. The left ventricle has severely decreased function. The left ventricle demonstrates global hypokinesis. Definity contrast agent was given IV to delineate the left ventricular endocardial borders. The left ventricular internal cavity size was normal in size. There is mild left ventricular hypertrophy. Left ventricular diastolic parameters are indeterminate. Right Ventricle: The right ventricular size is mildly enlarged. Right vetricular wall thickness was not well visualized. Right ventricular systolic function is severely reduced. There is normal pulmonary artery systolic pressure. The tricuspid regurgitant velocity is 1.86 m/s, and with an assumed right  atrial pressure of 15 mmHg, the estimated right ventricular systolic pressure  is 28.8 mmHg. Left Atrium: Left atrial size was mildly dilated. Right Atrium: Right atrial size was mildly dilated. Pericardium: Trivial pericardial effusion is present. Mitral Valve: The mitral valve is grossly normal. Mild mitral valve regurgitation. Tricuspid Valve: The tricuspid valve is grossly normal. Tricuspid valve regurgitation is trivial. Aortic Valve: The aortic valve is grossly normal. Aortic valve regurgitation is not visualized. No aortic stenosis is present. Pulmonic Valve: The pulmonic valve was not well visualized. Pulmonic valve regurgitation is trivial. Aorta: The aortic root is normal in size and structure. Venous: The inferior vena cava is dilated in size with less than 50% respiratory variability, suggesting right atrial pressure of 15 mmHg. IAS/Shunts: The interatrial septum was not well visualized. Additional Comments: A device lead is visualized in the right atrium and right ventricle.  LEFT VENTRICLE PLAX 2D LVIDd:         4.50 cm LVIDs:         4.50 cm LV PW:         1.10 cm LV IVS:        1.30 cm LVOT diam:     2.00 cm LV SV:         29 LV SV Index:   13 LVOT Area:     3.14 cm  LV Volumes (MOD) LV vol d, MOD A2C: 113.0 ml LV vol d, MOD A4C: 142.0 ml LV vol s, MOD A2C: 62.5 ml LV vol s, MOD A4C: 106.0 ml LV SV MOD A2C:     50.5 ml LV SV MOD A4C:     142.0 ml LV SV MOD BP:      41.6 ml RIGHT VENTRICLE            IVC RV S prime:     6.39 cm/s  IVC diam: 3.00 cm TAPSE (M-mode): 0.7 cm LEFT ATRIUM             Index       RIGHT ATRIUM           Index LA diam:        4.30 cm 1.99 cm/m  RA Area:     20.20 cm LA Vol (A2C):   42.2 ml 19.51 ml/m RA Volume:   64.10 ml  29.63 ml/m LA Vol (A4C):   47.3 ml 21.86 ml/m LA Biplane Vol: 45.2 ml 20.89 ml/m  AORTIC VALVE             PULMONIC VALVE LVOT Vmax:   75.00 cm/s  PR End Diast Vel: 1.53 msec LVOT Vmean:  46.700 cm/s LVOT VTI:    0.093 m  AORTA Ao Root diam: 3.10 cm Ao Asc diam:  3.00 cm Ao Arch diam: 3.2 cm MITRAL VALVE            TRICUSPID  VALVE MV Area (PHT): 4.68 cm TR Peak grad:   13.8 mmHg MV Decel Time: 162 msec TR Vmax:        186.00 cm/s                          SHUNTS                         Systemic VTI:  0.09 m  Systemic Diam: 2.00 cm Weston Brass MD Electronically signed by Weston Brass MD Signature Date/Time: 02/13/2021/5:40:25 PM    Final     Cardiac Studies    Cardiac Cath-PCI 02/13/2021: Culprit: Proximal-mid RCA 100% (->PCI Resolute Onyx DES 3.5 x 18-3.6 mm, 0% post PCI);, Proximal mid LAD 40% followed by 95% mid LAD.  Proximal LCx 80% followed by proximal to mid 90%. => Plan was aspirin Plavix and hold Eliquis pending staged PCI. Diagnostic : Dominance: Right       Patient Profile     80 y.o. 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  Mr. Printy left AMA on 15 March and then presented with escalating symptoms on at roughly 11 PM on 02/12/2021 to [facility] ER-EKG showed A. fib RVR with left on right block and ST elevations in aVR, III and V3. He was brought emergently to [facility] for emergent cardiac catheterization revealing RCA occlusion with severe lesions in the LAD and LCx.  The occluded RCA treated with a DES stent restoring flow, plan was for staged PCI of the other 2 lesions.  Assessment & Plan    1.  Status post inferior STEMI with placement of DES to the mid RCA on March 18.  High-sensitivity troponin I 644.  Currently on Plavix.  Patient reports an aspirin allergy.  He does not report any  angina at this time, ECG is stable with left bundle branch block.  2.  Multivessel CAD, residual disease at recent cardiac catheterization including 40% and 95% mid LAD, also 80% proximal circumflex and 90% mid circumflex.  Plan is for staged PCI of the LAD and circumflex on Monday.  He remains on heparin at this time.  3.  Persistent/permanent atrial fibrillation.  CHA2DS2-VASc score is 3.  He had been on Eliquis most recently for stroke prophylaxis, ultimately requesting to switch back to Coumadin which he had been on in the past with follow-up in the [facility] anticoagulation clinic.  Of note he did undergo recent cardioversion attempt as arranged by the AF clinic, however not successful in restoring sinus rhythm and plan at this point is to continue strategy of heart rate control and anticoagulation.  4.  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  RV contraction also significantly reduced.  Currently on low-dose Coreg.  Given current blood pressure will hold off on resuming Entresto and Aldactone at this point.  5.  Corn allergy.  Patient unhappy with his food choices.  Formal dietary consultation has been requested.  Continue Plavix, Coreg, Lipitor, and heparin.  Plan for staged PCI LAD and circumflex on Monday.  Had long discussion with patient this morning and tried to reassure him.  I encouraged him not to leave the hospital until we are able to further stabilize his cardiac status with planned PCI and further medical therapy adjustments.  Follow-up CBC, BMET, and ECG in a.m.  Signed, [name], MD  02/15/2021, 9:35 AM

## 2021-02-16 ENCOUNTER — Inpatient Hospital Stay (HOSPITAL_COMMUNITY)
Admission: AD | Disposition: A | Discharge status: Home / Self Care | Payer: Self-pay | Source: Home / Self Care | Attending: Cardiology

## 2021-02-16 ENCOUNTER — Ambulatory Visit: Payer: Medicare Other | Admitting: Cardiology

## 2021-02-16 ENCOUNTER — Encounter (HOSPITAL_COMMUNITY): Payer: Self-pay | Admitting: Cardiology

## 2021-02-16 DIAGNOSIS — I5022 Chronic systolic (congestive) heart failure: Secondary | ICD-10-CM

## 2021-02-16 HISTORY — PX: LEFT HEART CATH: CATH118248

## 2021-02-16 HISTORY — PX: CORONARY STENT INTERVENTION: CATH118234

## 2021-02-16 LAB — CBC
HCT: 46.2 % (ref 39.0–52.0)
Hemoglobin: 14.6 g/dL (ref 13.0–17.0)
MCH: 28.3 pg (ref 26.0–34.0)
MCHC: 31.6 g/dL (ref 30.0–36.0)
MCV: 89.5 fL (ref 80.0–100.0)
Platelets: 190 10*3/uL (ref 150–400)
RBC: 5.16 MIL/uL (ref 4.22–5.81)
RDW: 14.8 % (ref 11.5–15.5)
WBC: 7.8 10*3/uL (ref 4.0–10.5)
nRBC: 0 % (ref 0.0–0.2)

## 2021-02-16 LAB — BASIC METABOLIC PANEL
Anion gap: 9 (ref 5–15)
BUN: 16 mg/dL (ref 8–23)
CO2: 22 mmol/L (ref 22–32)
Calcium: 9 mg/dL (ref 8.9–10.3)
Chloride: 106 mmol/L (ref 98–111)
Creatinine, Ser: 1.27 mg/dL — ABNORMAL HIGH (ref 0.61–1.24)
GFR, Estimated: 58 mL/min — ABNORMAL LOW (ref >60)
Glucose, Bld: 91 mg/dL (ref 70–99)
Potassium: 4.3 mmol/L (ref 3.5–5.1)
Sodium: 137 mmol/L (ref 135–145)

## 2021-02-16 LAB — POCT ACTIVATED CLOTTING TIME
Activated Clotting Time: 321 seconds
Activated Clotting Time: 249 seconds

## 2021-02-16 LAB — HEPARIN LEVEL (UNFRACTIONATED): Heparin Unfractionated: 0.52 IU/mL (ref 0.30–0.70)

## 2021-02-16 LAB — SARS CORONAVIRUS 2 BY RT PCR (HOSPITAL ORDER, PERFORMED IN ~~LOC~~ HOSPITAL LAB): SARS Coronavirus 2: NEGATIVE

## 2021-02-16 LAB — GLUCOSE, CAPILLARY: Glucose-Capillary: 111 mg/dL — ABNORMAL HIGH (ref 70–99)

## 2021-02-16 SURGERY — CORONARY STENT INTERVENTION
Anesthesia: LOCAL

## 2021-02-16 MED ORDER — FENTANYL CITRATE (PF) 100 MCG/2ML IJ SOLN
INTRAMUSCULAR | Status: AC
  Filled 2021-02-16: qty 2

## 2021-02-16 MED ORDER — IOHEXOL 350 MG/ML SOLN
INTRAVENOUS | Status: AC
  Filled 2021-02-16: qty 1

## 2021-02-16 MED ORDER — VERAPAMIL HCL 2.5 MG/ML IV SOLN
INTRAVENOUS | Status: DC | PRN
  Administered 2021-02-16: 10 mL via INTRA_ARTERIAL

## 2021-02-16 MED ORDER — HEPARIN (PORCINE) 25000 UT/250ML-% IV SOLN
1800.0000 [IU]/h | INTRAVENOUS | Status: AC
  Administered 2021-02-16: 1800 [IU]/h via INTRAVENOUS
  Filled 2021-02-16 (×2): qty 250

## 2021-02-16 MED ORDER — MIDAZOLAM HCL 2 MG/2ML IJ SOLN
INTRAMUSCULAR | Status: AC
  Filled 2021-02-16: qty 2

## 2021-02-16 MED ORDER — HEPARIN (PORCINE) IN NACL 1000-0.9 UT/500ML-% IV SOLN
INTRAVENOUS | Status: DC | PRN
  Administered 2021-02-16 (×2): 500 mL

## 2021-02-16 MED ORDER — IOHEXOL 350 MG/ML SOLN
INTRAVENOUS | Status: DC | PRN
  Administered 2021-02-16: 70 mL

## 2021-02-16 MED ORDER — SODIUM CHLORIDE 0.9 % IV SOLN: INTRAVENOUS | Status: DC

## 2021-02-16 MED ORDER — SODIUM CHLORIDE 0.9 % IV SOLN: 250.0000 mL | INTRAVENOUS | Status: DC | PRN

## 2021-02-16 MED ORDER — VERAPAMIL HCL 2.5 MG/ML IV SOLN
INTRAVENOUS | Status: AC
  Filled 2021-02-16: qty 2

## 2021-02-16 MED ORDER — SODIUM CHLORIDE 0.9% FLUSH: 3.0000 mL | Freq: Two times a day (BID) | INTRAVENOUS | Status: DC

## 2021-02-16 MED ORDER — HEPARIN SODIUM (PORCINE) 1000 UNIT/ML IJ SOLN
INTRAMUSCULAR | Status: DC | PRN
  Administered 2021-02-16: 8000 [IU] via INTRAVENOUS

## 2021-02-16 MED ORDER — SODIUM CHLORIDE 0.9% FLUSH: 3.0000 mL | INTRAVENOUS | Status: DC | PRN

## 2021-02-16 MED ORDER — MIDAZOLAM HCL 2 MG/2ML IJ SOLN
INTRAMUSCULAR | Status: DC | PRN
  Administered 2021-02-16 (×2): 1 mg via INTRAVENOUS

## 2021-02-16 MED ORDER — LIDOCAINE HCL (PF) 1 % IJ SOLN
INTRAMUSCULAR | Status: DC | PRN
  Administered 2021-02-16: 2 mL

## 2021-02-16 MED ORDER — ALUM & MAG HYDROXIDE-SIMETH 200-200-20 MG/5ML PO SUSP
30.0000 mL | ORAL | Status: DC | PRN
  Filled 2021-02-16: qty 30

## 2021-02-16 MED ORDER — FENTANYL CITRATE (PF) 100 MCG/2ML IJ SOLN
INTRAMUSCULAR | Status: DC | PRN
  Administered 2021-02-16 (×3): 25 ug via INTRAVENOUS

## 2021-02-16 MED ORDER — HYDRALAZINE HCL 20 MG/ML IJ SOLN
10.0000 mg | INTRAMUSCULAR | Status: AC | PRN
Start: 2021-02-16 — End: 2021-02-16

## 2021-02-16 MED ORDER — SODIUM CHLORIDE 0.9 % IV SOLN: INTRAVENOUS | Status: AC

## 2021-02-16 MED ORDER — NITROGLYCERIN 1 MG/10 ML FOR IR/CATH LAB
INTRA_ARTERIAL | Status: AC
  Filled 2021-02-16: qty 10

## 2021-02-16 MED ORDER — LIDOCAINE HCL (PF) 1 % IJ SOLN
INTRAMUSCULAR | Status: AC
  Filled 2021-02-16: qty 30

## 2021-02-16 MED ORDER — HEPARIN (PORCINE) IN NACL 1000-0.9 UT/500ML-% IV SOLN
INTRAVENOUS | Status: AC
  Filled 2021-02-16: qty 1000

## 2021-02-16 MED ORDER — LABETALOL HCL 5 MG/ML IV SOLN: 10.0000 mg | INTRAVENOUS | Status: AC | PRN

## 2021-02-16 MED FILL — Verapamil HCl IV Soln 2.5 MG/ML: INTRAVENOUS | Qty: 2 | Status: AC

## 2021-02-16 SURGICAL SUPPLY — 17 items
BALLN SAPPHIRE 2.5X15 (BALLOONS) ×2
BALLOON SAPPHIRE 2.5X15 (BALLOONS) ×1 IMPLANT
CATH INFINITI JR4 5F (CATHETERS) ×2 IMPLANT
CATH VISTA GUIDE 6FR XBLAD3.5 (CATHETERS) ×2 IMPLANT
DEVICE RAD COMP TR BAND LRG (VASCULAR PRODUCTS) ×2 IMPLANT
ELECT DEFIB PAD ADLT CADENCE (PAD) ×2 IMPLANT
GLIDESHEATH SLEND SS 6F .021 (SHEATH) ×2 IMPLANT
GUIDEWIRE INQWIRE 1.5J.035X260 (WIRE) ×1 IMPLANT
INQWIRE 1.5J .035X260CM (WIRE) ×2
KIT ENCORE 26 ADVANTAGE (KITS) ×2 IMPLANT
KIT HEART LEFT (KITS) ×2 IMPLANT
PACK CARDIAC CATHETERIZATION (CUSTOM PROCEDURE TRAY) ×2 IMPLANT
STENT RESOLUTE ONYX 2.5X30 (Permanent Stent) ×2 IMPLANT
STENT RESOLUTE ONYX 3.0X30 (Permanent Stent) ×2 IMPLANT
TRANSDUCER W/STOPCOCK (MISCELLANEOUS) ×2 IMPLANT
TUBING CIL FLEX 10 FLL-RA (TUBING) ×2 IMPLANT
WIRE ASAHI PROWATER 180CM (WIRE) ×2 IMPLANT

## 2021-02-16 NOTE — Progress Notes (Signed)
ANTICOAGULATION CONSULT NOTE  Pharmacy Consult for Heparin drip Indication: chest pain/ACS and atrial fibrillation  Allergies  Allergen Reactions  . Bee Venom Shortness Of Breath    BEE STINGS - dizziness   . Corn-Containing Products Anaphylaxis    Confirmed with pt that he avoids all corn containing products. SS 04/30/20 Patient states cooking spray is not an issue  . Citrullus Vulgaris Other (See Comments)    Other reaction(s): GI Upset (intolerance)   . Codeine Other (See Comments)    REACTION: muscle cramps  . Invert Sugar Other (See Comments)    Unknown reaction  . Rice Other (See Comments)    Unknown reaction  . Vanilla Diarrhea    Patient Measurements: Height: 5\' 10"  (177.8 cm) Weight: 98.7 kg (217 lb 9.5 oz) IBW/kg (Calculated) : 73  Heparin dosing wt: 94 kg   Vital Signs: Temp: 97.4 F (36.3 C) (03/21 0700) Temp Source: Oral (03/21 0700) BP: 125/92 (03/21 0953) Pulse Rate: 88 (03/21 0953)  Labs: Recent Labs    02/14/21 0046 02/14/21 0956 02/15/21 0119 02/15/21 1046 02/16/21 0123  HGB 14.8  --  14.4  --  14.6  HCT 44.6  --  44.7  --  46.2  PLT 193  --  183  --  190  HEPARINUNFRC 0.20*   < > 0.16* 0.36 0.52  CREATININE 1.02  --  1.07  --  1.27*   < > = values in this interval not displayed.    Estimated Creatinine Clearance: 57.4 mL/min (A) (by C-G formula based on SCr of 1.27 mg/dL (H)).  Assessment: [name] admitted for STEMI s/p DES with plans to go back to cath lab for staged PCI next week.  Afib on warfarin PTA held the last week INR 1.5. Will start heparin drip while off oral anticoagulation.   Pt now s/p LHC with plans to resume heparin 8h after sheath pull ~1000.  Goal of Therapy:  Heparin level 0.3-0.7 units/ml Monitor platelets by anticoagulation protocol: Yes   Plan:  Restart heparin 1800 units/h no bolus at 1800 toonight Check heparin level 8h after restart Need to F/U anticoagulation plans tomorrow for discharge   02/18/21,  PharmD, BCPS, [facility] Clinical Pharmacist [phone] Please check AMION for all [facility] Pharmacy numbers 02/16/2021

## 2021-02-16 NOTE — Interval H&P Note (Signed)
History and Physical Interval Note:  02/16/2021 8:41 AM  [name]  has presented today for surgery, with the diagnosis of CORONARY ARTERY DISEASE,ISCHEMIC CARDIOMYOPATHY, RECENT INFERIOR STEMI.  The various methods of treatment have been discussed with the patient and family. After consideration of risks, benefits and other options for treatment, the patient has consented to  Procedure(s): CORONARY STENT INTERVENTION ([name]) as a surgical intervention - as a STAGED procedure.    The patient's history has been reviewed, patient examined, no change in status, stable for surgery.  I have reviewed the patient's chart and labs.  Questions were answered to the patient's satisfaction.      Cath Lab Visit (complete for each Cath Lab visit)  Clinical Evaluation Leading to the Procedure:   ACS: No.  Non-ACS:    Anginal Classification: CCS III - CHF  Anti-ischemic medical therapy: Minimal Therapy (1 class of medications)  Non-Invasive Test Results: No non-invasive testing performed  Prior CABG: No previous CABG    Bryan Lemma

## 2021-02-16 NOTE — Progress Notes (Signed)
Progress Note  Patient Name: [name] Date of Encounter: 02/16/2021  [facility] Cardiologist: [name], MD   Subjective   Patient is still sedated following double vessel PCI by Dr. Herbie Baltimore.  Dr. Herbie Baltimore tells me that the patient wants to go home today.  He has many unresolved issues related to management that include anticoagulation along with dual antiplatelet therapy.  Severe left ventricular systolic dysfunction.  Inpatient Medications    Scheduled Meds: . atorvastatin  80 mg Oral Daily  . carvedilol  3.125 mg Oral BID  . Chlorhexidine Gluconate Cloth  6 each Topical Daily  . clopidogrel  75 mg Oral Q breakfast  . pantoprazole  40 mg Oral Q0600  . sodium chloride flush  3 mL Intravenous Q12H  . sodium chloride flush  3 mL Intravenous Q12H   Continuous Infusions: . sodium chloride    . sodium chloride    . sodium chloride    . heparin 1,800 Units/hr (02/16/21 0600)   PRN Meds: sodium chloride, sodium chloride, acetaminophen, ondansetron (ZOFRAN) IV, sodium chloride flush, sodium chloride flush   Vital Signs    Vitals:   02/16/21 0315 02/16/21 0400 02/16/21 0600 02/16/21 0700  BP: 113/86 112/86 105/85   Pulse: 81 99 81   Resp: (!) 0 (!) 26 15   Temp:  (!) 97.3 F (36.3 C)  (!) 97.4 F (36.3 C)  TempSrc:  Axillary  Oral  SpO2: (!) 81% 97% 95%   Weight:      Height:        Intake/Output Summary (Last 24 hours) at 02/16/2021 0819 Last data filed at 02/16/2021 0600 Gross per 24 hour  Intake 1251.4 ml  Output 1600 ml  Net -348.6 ml   Last 3 Weights 02/13/2021 02/04/2021 01/08/2021  Weight (lbs) 217 lb 9.5 oz 212 lb 224 lb  Weight (kg) 98.7 kg 96.163 kg 101.606 kg      Telemetry    Atrial fib with rates 85 and 115 bpm.- Personally Reviewed  ECG    Left bundle branch block with QRS D1 146 ms.- Personally Reviewed  Physical Exam  Asleep with obvious Cheyne Stokes respirations. GEN: No acute distress.   Neck: No JVD Cardiac:  Audible S3  gallop Respiratory: Clear to auscultation bilaterally. GI: Soft, nontender, non-distended  MS: No edema; No deformity. Neuro:   Just received sedation and is not arousable Psych: Normal affect   Labs    High Sensitivity Troponin:   Recent Labs  Lab 02/13/21 0425  TROPONINIHS 644*      Chemistry Recent Labs  Lab 02/13/21 0425 02/14/21 0046 02/15/21 0119 02/16/21 0123  NA 138 138 137 137  K 4.1 3.8 4.3 4.3  CL 108 108 106 106  CO2 21* 22 21* 22  GLUCOSE 140* 118* 92 91  BUN 22 19 15 16   CREATININE 1.09 1.02 1.07 1.27*  CALCIUM 9.0 9.0 9.0 9.0  PROT 5.9*  --   --   --   ALBUMIN 3.3*  --   --   --   AST 29  --   --   --   ALT 30  --   --   --   ALKPHOS 58  --   --   --   BILITOT 1.4*  --   --   --   GFRNONAA >60 >60 >60 58*  ANIONGAP 9 8 10 9      Hematology Recent Labs  Lab 02/14/21 0046 02/15/21 0119 02/16/21 0123  WBC 8.5 8.4 7.8  RBC 5.15 5.00 5.16  HGB 14.8 14.4 14.6  HCT 44.6 44.7 46.2  MCV 86.6 89.4 89.5  MCH 28.7 28.8 28.3  MCHC 33.2 32.2 31.6  RDW 14.8 15.0 14.8  PLT 193 183 190    BNPNo results for input(s): BNP, PROBNP in the last 168 hours.   DDimer No results for input(s): DDIMER in the last 168 hours.   Radiology    No results found.  Cardiac Studies   CATH PCI 02/13/2021: Diagnostic Dominance: Right    Intervention     Just completed stenting of the proximal to mid LAD and proximal circumflex.  There is 70% ostial circumflex that was not treated.    Patient Profile     80 y.o. 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 RCA stent 3/18 and plan staged PCI 02/16/2021 LAD and Cfx.  Assessment & Plan    1. STEMI presentation with urgent management of total occlusion of RCA --> stented with good result. 2. Multivessel CAD with staged double vessel PCI on LAD and circumflex: Will need long-term antiplatelet  therapy.  Aspirin and Plavix.  Discontinue aspirin after 2 to 3 weeks. 3. Systolic heart failure, chronic: Etiology is possibly next related to underlying CAD and atrial fib with poor rate control.  Plan is to achieve better rate control, potentially by adding low-dose digoxin.  Beta-blocker, Entresto, will be resumed once stable following PCI.  Continue carvedilol.  After sedation, Cheyne-Stokes respirations developed suggesting the heart failure may be more severe than we think. 4. Persistent atrial fibrillation with recent failed cardioversion: Beta-blocker therapy to control rate and consider adding low-dose digoxin.  Will use Eliquis for the time being as this will be simpler to ensure adequate anticoagulation. 5. Patient discontent and tendency to sign out AGAINST MEDICAL ADVICE: We will encourage the patient to stay and allow medication titration.  For questions or updates, please contact [facility] Please consult [email or web address] for contact info under        Signed, Lesleigh Noe, MD  02/16/2021, 8:19 AM

## 2021-02-17 ENCOUNTER — Ambulatory Visit: Payer: Medicare Other | Admitting: Family Medicine

## 2021-02-17 ENCOUNTER — Telehealth: Payer: Self-pay | Admitting: Cardiology

## 2021-02-17 ENCOUNTER — Other Ambulatory Visit: Payer: Self-pay | Admitting: Cardiology

## 2021-02-17 LAB — BASIC METABOLIC PANEL
Anion gap: 9 (ref 5–15)
BUN: 16 mg/dL (ref 8–23)
CO2: 19 mmol/L — ABNORMAL LOW (ref 22–32)
Calcium: 8.8 mg/dL — ABNORMAL LOW (ref 8.9–10.3)
Chloride: 108 mmol/L (ref 98–111)
Creatinine, Ser: 1.14 mg/dL (ref 0.61–1.24)
GFR, Estimated: >60 mL/min (ref >60)
Glucose, Bld: 91 mg/dL (ref 70–99)
Potassium: 4.6 mmol/L (ref 3.5–5.1)
Sodium: 136 mmol/L (ref 135–145)

## 2021-02-17 LAB — CBC
HCT: 45.2 % (ref 39.0–52.0)
Hemoglobin: 14.5 g/dL (ref 13.0–17.0)
MCH: 28.7 pg (ref 26.0–34.0)
MCHC: 32.1 g/dL (ref 30.0–36.0)
MCV: 89.5 fL (ref 80.0–100.0)
Platelets: 186 10*3/uL (ref 150–400)
RBC: 5.05 MIL/uL (ref 4.22–5.81)
RDW: 14.9 % (ref 11.5–15.5)
WBC: 8.1 10*3/uL (ref 4.0–10.5)
nRBC: 0 % (ref 0.0–0.2)

## 2021-02-17 LAB — HEPARIN LEVEL (UNFRACTIONATED): Heparin Unfractionated: 0.23 IU/mL — ABNORMAL LOW (ref 0.30–0.70)

## 2021-02-17 MED ORDER — ATORVASTATIN CALCIUM 80 MG PO TABS: 80.0000 mg | ORAL_TABLET | Freq: Every day | ORAL | 0 refills | Status: DC

## 2021-02-17 MED ORDER — SENNOSIDES-DOCUSATE SODIUM 8.6-50 MG PO TABS
1.0000 | ORAL_TABLET | Freq: Two times a day (BID) | ORAL | Status: DC | PRN
  Administered 2021-02-17: 1 via ORAL
  Filled 2021-02-17: qty 1

## 2021-02-17 MED ORDER — ELIQUIS 5 MG PO TABS: 5.0000 mg | ORAL_TABLET | Freq: Two times a day (BID) | ORAL | 2 refills | Status: DC

## 2021-02-17 MED ORDER — SACUBITRIL-VALSARTAN 24-26 MG PO TABS
1.0000 | ORAL_TABLET | Freq: Two times a day (BID) | ORAL | Status: DC
  Administered 2021-02-17: 1 via ORAL
  Filled 2021-02-17 (×2): qty 1

## 2021-02-17 MED ORDER — ASPIRIN EC 81 MG PO TBEC
81.0000 mg | DELAYED_RELEASE_TABLET | Freq: Every day | ORAL | Status: DC
  Administered 2021-02-17: 81 mg via ORAL
  Filled 2021-02-17: qty 1

## 2021-02-17 MED ORDER — PANTOPRAZOLE SODIUM 40 MG PO TBEC: 40.0000 mg | DELAYED_RELEASE_TABLET | Freq: Every day | ORAL | 1 refills | Status: DC

## 2021-02-17 MED ORDER — CARVEDILOL 3.125 MG PO TABS: 3.1250 mg | ORAL_TABLET | Freq: Two times a day (BID) | ORAL | 0 refills | Status: DC

## 2021-02-17 MED ORDER — ASPIRIN 81 MG PO TBEC: 81.0000 mg | DELAYED_RELEASE_TABLET | Freq: Every day | ORAL | 0 refills | Status: DC

## 2021-02-17 MED ORDER — NITROGLYCERIN 0.4 MG SL SUBL: 0.4000 mg | SUBLINGUAL_TABLET | SUBLINGUAL | 2 refills | Status: DC | PRN

## 2021-02-17 MED ORDER — APIXABAN 5 MG PO TABS
5.0000 mg | ORAL_TABLET | Freq: Two times a day (BID) | ORAL | Status: DC
  Administered 2021-02-17: 5 mg via ORAL
  Filled 2021-02-17: qty 1

## 2021-02-17 MED ORDER — SACUBITRIL-VALSARTAN 24-26 MG PO TABS: 1.0000 | ORAL_TABLET | Freq: Two times a day (BID) | ORAL | 2 refills | Status: DC

## 2021-02-17 MED ORDER — CLOPIDOGREL BISULFATE 75 MG PO TABS: 75.0000 mg | ORAL_TABLET | Freq: Every day | ORAL | 2 refills | Status: DC

## 2021-02-17 MED FILL — NITROGLYCERIN 0.4 MG TAB SL: 0.4 | 7 days supply | Qty: 25 | Fill #0

## 2021-02-17 MED FILL — CLOPIDOGREL 75 MG TABLET: 75 | 90 days supply | Qty: 90 | Fill #0

## 2021-02-17 MED FILL — CARVEDILOL 3.125 MG TABLET: 3.125 | 90 days supply | Qty: 180 | Fill #0

## 2021-02-17 MED FILL — ASPIRIN LOW DOSE 81 MG TBEC: 81 | 14 days supply | Qty: 14 | Fill #0

## 2021-02-17 MED FILL — Nitroglycerin IV Soln 100 MCG/ML in D5W: INTRA_ARTERIAL | Qty: 10 | Status: AC

## 2021-02-17 NOTE — Progress Notes (Signed)
CARDIAC REHAB PHASE I   PRE:  Rate/Rhythm: 105 Afib  BP:  Sitting:103/79      SaO2: 96 RA  MODE:  Ambulation: 170 ft   POST:  Rate/Rhythm: 133 Afib  BP:  Sitting: 111/92    SaO2: 95 RA   Pt ambulated 113ft in hallway assist of one with front wheel walker. Pt states some weakness and SOB due to inactivity. Denies CP. Pt returned to recliner. Pt educated on importance of ASA, Plavix, and Eliquis. Pt given stent card, MI Book, HF book, and heart healthy diet. Reviewed restrictions, site care, exercise guidelines, and importance of daily weights. Will refer to CRP II [place]. Pt anxious for d/c.   [id number] Reynold Bowen, RN BSN 02/17/2021 10:07 AM

## 2021-02-17 NOTE — Progress Notes (Signed)
Received pt from room 18 with Heparin drip at 1800 units/h.Alert and oriented.On 1 L [place].Sats >95%.Will continue to monitor. 

## 2021-02-17 NOTE — Discharge Summary (Addendum)
The patient has been seen in conjunction with Laverda Page, NP. All aspects of care have been considered and discussed. The patient has been personally interviewed, examined, and all clinical data has been reviewed.  Complicated patient with significant CAD / AF with poor rate control / and systolic HF. Plan DC today with instructions as outlined in this note and the previous note written earlier today.    Discharge Summary    Patient ID: [name] MRN: [id number]; DOB: 1941/09/15  Admit date: 02/13/2021 Discharge date: 02/17/2021  PCP:  [facility]  Cardiologist:  [name], MD  Advanced Practice Provider:  No care team member to display Electrophysiologist:  [name], MD    Discharge Diagnoses    Principal Problem:   Acute ST elevation myocardial infarction (STEMI) of inferior wall [facility]) Active Problems:   Sinoatrial node dysfunction (HCC)   Left bundle branch block   Long term current use of anticoagulant therapy   Dilated cardiomyopathy (HCC)   Persistent atrial fibrillation (HCC)   ST elevation myocardial infarction involving right coronary artery (HCC)   Coronary artery disease involving native coronary artery of native heart with unstable angina pectoris (HCC)   Presence of drug-eluting stent in right coronary artery   Hyperlipidemia with target LDL less than 70    Diagnostic Studies/Procedures    Cath: 02/13/21  Prox Cx lesion is 80% stenosed. Prox Cx to Mid Cx lesion is 90% stenosed. Prox LAD to Mid LAD lesion is 40% stenosed. Mid LAD-1 lesion is 95% stenosed. Mid LAD-2 lesion is 20% stenosed. Prox RCA lesion is 20% stenosed. Prox RCA to Mid RCA lesion is 100% stenosed. Post intervention, there is a 0% residual stenosis. A stent was successfully placed.   ST segment elevation MI with total occlusion of the large mid RCA with thrombus and initial TIMI 0 flow.   Severe concomitant  multivessel disease with 40% proximal LAD stenosis followed by 90 to 95% stenosis; and 80 and 90% proximal circumflex stenoses.   LVEDP 21 mmHg   Successful PCI to the totally occluded RCA with ultimate insertion of a 3.5x18 mm Resolute Onyx stent postdilated to 3.55 mm with resumption of brisk TIMI-3 flow resulting in a large dominant RCA with the 100% occlusion being reduced to 0%.  There is mild 30% stenosis in the distal RCA before the PDA takeoff with 30% proximal PDA stenoses.   Recommendation: At present we will initiate aspirin/Plavix and hold Eliquis.  The patient will need staged intervention to his LAD and circumflex vessels.  He has reported EF in the 20 to 25% range.  Hopefully LV function may improve following revascularization.  The patient's warfarin dose had been discontinued at [facility] earlier in the week.  The patient was uncertain when his last dose of Eliquis was but he did not take it this evening.  Continue optimal therapy for HFrEF, and aggressive lipid-lowering therapy with target LDL less than 70.   Diagnostic Dominance: Right    Intervention     Cath: 02/16/21  LV end diastolic pressure is mildly elevated. ------------------------------- Lesion Segment #1: Prox LAD to Mid LAD lesion is 40% stenosed. Mid LAD-1 lesion is 95% stenosed. A drug-eluting stent was successfully placed covering both lesions, using a STENT RESOLUTE ONYX 3.0X30 -> postdilated to 3.3 mm Post intervention, there is a 0% residual stenosis throughout. --------------------------- Suezanne Jacquet Cx lesion is 50% stenosed. -> The intention was to avoid during this lesion.  Lesion Segment #2: Prox Cx lesion is 80% stenosed. Prox Cx to Mid Cx lesion is 90% stenosed. A drug-eluting stent was successfully placed covering both lesions, using a STENT RESOLUTE ONYX 2.5X30. Postdilated 2.65 mm. Post intervention, there is a 0% residual stenosis throughout..   SUMMARY Successful two-vessel PCI: Mid LAD:  Resolute Onyx DES 3.0 mm x 30 mm postdilated to 3.3 mm Proximal to mid LCx: Resolute Onyx DES 2.5 mm x 30 mm postdilated to 2.65 mm LVEDP 18 mm     RECOMMENDATIONS Would strongly consider at least the short-term use of Eliquis as opposed to warfarin to facilitate ease of discharge Initial thoughts of potential discharge today, however with his uncontrolled A. fib rate, I suspect that he may need another day or so of management. We will gently hydrate, may need diuretic at the end of the day.  Bryan Lemma, M.D., M.S.  Diagnostic Dominance: Right    Intervention      Echo: 02/13/21  IMPRESSIONS     1. Left ventricular ejection fraction, by estimation, is 25%. The left  ventricle has severely decreased function. The left ventricle demonstrates  global hypokinesis. There is mild left ventricular hypertrophy. Left  ventricular diastolic parameters are  indeterminate.   2. Right ventricular systolic function is severely reduced. The right  ventricular size is mildly enlarged. There is normal pulmonary artery  systolic pressure.   3. Left atrial size was mildly dilated.   4. Right atrial size was mildly dilated.   5. The mitral valve is grossly normal. Mild mitral valve regurgitation.   6. The aortic valve is grossly normal. Aortic valve regurgitation is not  visualized. No aortic stenosis is present.   7. The inferior vena cava is dilated in size with <50% respiratory  variability, suggesting right atrial pressure of 15 mmHg.   Conclusion(s)/Recommendation(s): No left ventricular mural or apical  thrombus/thrombi. Noncontrast images suggest fixed calcified apical  structure, which is not seen on contrast images.  _____________   History of Present Illness     [name] is a 80 y.o. 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  He was found to be in atrial fibrillation with RVR with an elevated troponin consistent with non-STEMI.  Notes they are indicated a left heart cath was planned but patient refused to stay for the procedure and left AMA.   He does have history of atrial fibrillation with several unsuccessful attempts at cardioversion most recently 1 week prior to admission.  He was previously on amiodarone but this was stopped due to pulmonary toxicity.  He has been rate controlled with Coreg.  He was changed over from Coumadin to Eliquis while at [facility].  He initially presented to the [facility] the evening prior to admission complaining of 2 days of chest pain.  States that pain basically reoccurred after leaving AGAINST MEDICAL ADVICE on 3/15.  Pain was described as a "crushing sensation" with associated diaphoresis and lightheadedness, shortness of breath and nausea as well.  EKG done in the ED at [facility] showed atrial fibrillation with RVR, left bundle branch block and ST elevation in aVR, lead III and V3.  Code STEMI was activated and he was brought emergently to [facility] for further management with cardiac catheterization.  Hospital  Course     1.  Acute inferior wall STEMI: Underwent cardiac catheterization with Dr. Tresa Endo noting proximal to mid RCA 100% occlusion which was treated with PCI/DES x1.  Was placed on Plavix post cath, with aspirin initially held as there was concern for possible allergy.  Did have residual disease in the LAD and circumflex which was planned for staged intervention.  Was confirmed that he did not have aspirin allergy and placed on DAPT with aspirin/Plavix.  Underwent staged intervention with successful two-vessel PCI with DES to mid LAD, DES to proximal/mid circumflex. --Plan for triple therapy with aspirin 81 mg for 2 weeks, Plavix 75 mg daily and Eliquis 5 mg twice daily.  After  that time DC aspirin and continue Plavix and Eliquis. --Worked with cardiac rehab without recurrent chest pain.  Education provided regarding need for medication compliance.  2.  Persistent atrial fibrillation: Remained in atrial fibrillation throughout admission. ChadsVasc of 3. Rate controlled with beta-blocker therapy.  Does have history of failed cardioversion at outside hospitals. --He was resumed on Eliquis 5 mg twice daily post cath.  3.  Systolic heart failure: Suspect mixed etiology with underlying CAD and tachycardia mediated.  EF noted at 20 to 25% with global hypokinesis.  -- Placed on Coreg 3.125 mg twice daily as well as Entresto 24/26 mg twice daily  --Consider addition of spironolactone/SGLT2 therapy at follow-up appointment --Repeat echocardiogram in 3 months, if no LV recovery then would consider resynchronization therapy as patient has known left bundle branch block  4.  Tachybrady syndrome s/p PPM: Followed by Dr. Graciela Husbands as an outpatient.  5. HLD: LDL 73 -- started on atorvastatin 80mg  daily -- FLP/LFTs in 8 weeks   Patient was seen by Dr. Katrinka Blazing and determined stable for discharge.  Follow-up in the [facility] office has been arranged.  Medications were delivered from the [facility] pharmacy/educated by Pharm.D. prior to discharge.  Did the patient have an acute coronary syndrome (MI, NSTEMI, STEMI, etc) this admission?:  Yes                               AHA/ACC Clinical Performance & Quality Measures: Aspirin prescribed? - Yes ADP Receptor Inhibitor (Plavix/Clopidogrel, Brilinta/Ticagrelor or Effient/Prasugrel) prescribed (includes medically managed patients)? - Yes Beta Blocker prescribed? - Yes High Intensity Statin (Lipitor 40-80mg  or Crestor 20-40mg ) prescribed? - Yes EF assessed during THIS hospitalization? - Yes For EF <40%, was ACEI/ARB prescribed? - Yes For EF <40%, Aldosterone Antagonist (Spironolactone or Eplerenone) prescribed? - No - Reason:  Consider adding an  outpatient follow-up Cardiac Rehab Phase II ordered (including medically managed patients)? - Yes       _____________  Discharge Vitals Blood pressure 125/88, pulse 79, temperature (!) 97.5 F (36.4 C), temperature source Axillary, resp. rate (!) 26, height 5\' 10"  (1.778 m), weight 98.7 kg, SpO2 98 %.  Filed Weights   02/13/21 0545  Weight: 98.7 kg    Labs & Radiologic Studies    CBC Recent Labs    02/16/21 0123 02/17/21 0039  WBC 7.8 8.1  HGB 14.6 14.5  HCT 46.2 45.2  MCV 89.5 89.5  PLT 190 186   Basic Metabolic Panel Recent Labs    16/10/96 0123 02/17/21 0039  NA 137 136  K 4.3 4.6  CL 106 108  CO2 22 19*  GLUCOSE 91 91  BUN 16 16  CREATININE 1.27* 1.14  CALCIUM 9.0 8.8*   Liver Function Tests No  results for input(s): AST, ALT, ALKPHOS, BILITOT, PROT, ALBUMIN in the last 72 hours. No results for input(s): LIPASE, AMYLASE in the last 72 hours. High Sensitivity Troponin:   Recent Labs  Lab 02/13/21 0425  TROPONINIHS 644*    BNP Invalid input(s): POCBNP D-Dimer No results for input(s): DDIMER in the last 72 hours. Hemoglobin A1C No results for input(s): HGBA1C in the last 72 hours. Fasting Lipid Panel No results for input(s): CHOL, HDL, LDLCALC, TRIG, CHOLHDL, LDLDIRECT in the last 72 hours. Thyroid Function Tests No results for input(s): TSH, T4TOTAL, T3FREE, THYROIDAB in the last 72 hours.  Invalid input(s): FREET3 _____________  CARDIAC CATHETERIZATION  Result Date: 02/16/2021  LV end diastolic pressure is mildly elevated.  -------------------------------  Lesion Segment #1: Prox LAD to Mid LAD lesion is 40% stenosed. Mid LAD-1 lesion is 95% stenosed.  A drug-eluting stent was successfully placed covering both lesions, using a STENT RESOLUTE ONYX 3.0X30 -> postdilated to 3.3 mm  Post intervention, there is a 0% residual stenosis throughout.  ---------------------------  Suezanne Jacquet Cx lesion is 50% stenosed. -> The intention was to avoid during this  lesion.  Lesion Segment #2: Prox Cx lesion is 80% stenosed. Prox Cx to Mid Cx lesion is 90% stenosed.  A drug-eluting stent was successfully placed covering both lesions, using a STENT RESOLUTE ONYX 2.5X30. Postdilated 2.65 mm.  Post intervention, there is a 0% residual stenosis throughout..  SUMMARY  Successful two-vessel PCI:  Mid LAD: Resolute Onyx DES 3.0 mm x 30 mm postdilated to 3.3 mm  Proximal to mid LCx: Resolute Onyx DES 2.5 mm x 30 mm postdilated to 2.65 mm  LVEDP 18 mm RECOMMENDATIONS  Would strongly consider at least the short-term use of Eliquis as opposed to warfarin to facilitate ease of discharge  Initial thoughts of potential discharge today, however with his uncontrolled A. fib rate, I suspect that he may need another day or so of management.  We will gently hydrate, may need diuretic at the end of the day. Bryan Lemma, M.D., M.S. Interventional Cardiologist   CARDIAC CATHETERIZATION  Result Date: 02/13/2021  Prox Cx lesion is 80% stenosed.  Prox Cx to Mid Cx lesion is 90% stenosed.  Prox LAD to Mid LAD lesion is 40% stenosed.  Mid LAD-1 lesion is 95% stenosed.  Mid LAD-2 lesion is 20% stenosed.  Prox RCA lesion is 20% stenosed.  Prox RCA to Mid RCA lesion is 100% stenosed.  Post intervention, there is a 0% residual stenosis.  A stent was successfully placed.  ST segment elevation MI with total occlusion of the large mid RCA with thrombus and initial TIMI 0 flow. Severe concomitant multivessel disease with 40% proximal LAD stenosis followed by 90 to 95% stenosis; and 80 and 90% proximal circumflex stenoses. LVEDP 21 mmHg Successful PCI to the totally occluded RCA with ultimate insertion of a 3.5x18 mm Resolute Onyx stent postdilated to 3.55 mm with resumption of brisk TIMI-3 flow resulting in a large dominant RCA with the 100% occlusion being reduced to 0%.  There is mild 30% stenosis in the distal RCA before the PDA takeoff with 30% proximal PDA stenoses. Recommendation:  At present we will initiate aspirin/Plavix and hold Eliquis.  The patient will need staged intervention to his LAD and circumflex vessels.  He has reported EF in the 20 to 25% range.  Hopefully LV function may improve following revascularization.  The patient's warfarin dose had been discontinued at [facility] earlier in the week.  The patient was uncertain  when his last dose of Eliquis was but he did not take it this evening.  Continue optimal therapy for HFrEF, and aggressive lipid-lowering therapy with target LDL less than 70.   ECHOCARDIOGRAM COMPLETE  Result Date: 02/13/2021    ECHOCARDIOGRAM REPORT   Patient Name:   [name] Date of Exam: 02/13/2021 Medical Rec #:  [id number]       Height:       70.0 in Accession #:    [id number]      Weight:       217.6 lb Date of Birth:  01-27-1941       BSA:          2.163 m Patient Age:    79 years        BP:           80/59 mmHg Patient Gender: M               HR:           103 bpm. Exam Location:  Inpatient Procedure: 2D Echo, Color Doppler, Cardiac Doppler and Intracardiac            Opacification Agent Indications:    R07.9* Chest pain, unspecified; I25.110 Atherosclerotic heart                 disease of native coronary artery with unstable angina pectoris;                 I48.91* Unspeicified atrial fibrillation; I42.9 Cardiomyopathy                 (unspecified)  History:        Patient has prior history of Echocardiogram examinations, most                 recent 03/04/2020. CHF, CAD, Acute MI and Previous Myocardial                 Infarction, Abnormal ECG and Pacemaker, Arrythmias:Atrial                 Fibrillation and LBBB; Signs/Symptoms:Shortness of Breath,                 Dyspnea and Chest Pain.  Sonographer:    Sheralyn Boatman RDCS Referring Phys: [id number] DAVID [facility]  Sonographer Comments: Technically difficult study due to poor echo windows and patient is morbidly obese. Image acquisition challenging due to patient body habitus. IMPRESSIONS  1. Left  ventricular ejection fraction, by estimation, is 25%. The left ventricle has severely decreased function. The left ventricle demonstrates global hypokinesis. There is mild left ventricular hypertrophy. Left ventricular diastolic parameters are indeterminate.  2. Right ventricular systolic function is severely reduced. The right ventricular size is mildly enlarged. There is normal pulmonary artery systolic pressure.  3. Left atrial size was mildly dilated.  4. Right atrial size was mildly dilated.  5. The mitral valve is grossly normal. Mild mitral valve regurgitation.  6. The aortic valve is grossly normal. Aortic valve regurgitation is not visualized. No aortic stenosis is present.  7. The inferior vena cava is dilated in size with <50% respiratory variability, suggesting right atrial pressure of 15 mmHg. Conclusion(s)/Recommendation(s): No left ventricular mural or apical thrombus/thrombi. Noncontrast images suggest fixed calcified apical structure, which is not seen on contrast images. FINDINGS  Left Ventricle: Left ventricular ejection fraction, by estimation, is 25%. The left ventricle has severely decreased function. The left ventricle demonstrates global hypokinesis. Definity contrast agent  was given IV to delineate the left ventricular endocardial borders. The left ventricular internal cavity size was normal in size. There is mild left ventricular hypertrophy. Left ventricular diastolic parameters are indeterminate. Right Ventricle: The right ventricular size is mildly enlarged. Right vetricular wall thickness was not well visualized. Right ventricular systolic function is severely reduced. There is normal pulmonary artery systolic pressure. The tricuspid regurgitant velocity is 1.86 m/s, and with an assumed right atrial pressure of 15 mmHg, the estimated right ventricular systolic pressure is 28.8 mmHg. Left Atrium: Left atrial size was mildly dilated. Right Atrium: Right atrial size was mildly dilated.  Pericardium: Trivial pericardial effusion is present. Mitral Valve: The mitral valve is grossly normal. Mild mitral valve regurgitation. Tricuspid Valve: The tricuspid valve is grossly normal. Tricuspid valve regurgitation is trivial. Aortic Valve: The aortic valve is grossly normal. Aortic valve regurgitation is not visualized. No aortic stenosis is present. Pulmonic Valve: The pulmonic valve was not well visualized. Pulmonic valve regurgitation is trivial. Aorta: The aortic root is normal in size and structure. Venous: The inferior vena cava is dilated in size with less than 50% respiratory variability, suggesting right atrial pressure of 15 mmHg. IAS/Shunts: The interatrial septum was not well visualized. Additional Comments: A device lead is visualized in the right atrium and right ventricle.  LEFT VENTRICLE PLAX 2D LVIDd:         4.50 cm LVIDs:         4.50 cm LV PW:         1.10 cm LV IVS:        1.30 cm LVOT diam:     2.00 cm LV SV:         29 LV SV Index:   13 LVOT Area:     3.14 cm  LV Volumes (MOD) LV vol d, MOD A2C: 113.0 ml LV vol d, MOD A4C: 142.0 ml LV vol s, MOD A2C: 62.5 ml LV vol s, MOD A4C: 106.0 ml LV SV MOD A2C:     50.5 ml LV SV MOD A4C:     142.0 ml LV SV MOD BP:      41.6 ml RIGHT VENTRICLE            IVC RV S prime:     6.39 cm/s  IVC diam: 3.00 cm TAPSE (M-mode): 0.7 cm LEFT ATRIUM             Index       RIGHT ATRIUM           Index LA diam:        4.30 cm 1.99 cm/m  RA Area:     20.20 cm LA Vol (A2C):   42.2 ml 19.51 ml/m RA Volume:   64.10 ml  29.63 ml/m LA Vol (A4C):   47.3 ml 21.86 ml/m LA Biplane Vol: 45.2 ml 20.89 ml/m  AORTIC VALVE             PULMONIC VALVE LVOT Vmax:   75.00 cm/s  PR End Diast Vel: 1.53 msec LVOT Vmean:  46.700 cm/s LVOT VTI:    0.093 m  AORTA Ao Root diam: 3.10 cm Ao Asc diam:  3.00 cm Ao Arch diam: 3.2 cm MITRAL VALVE            TRICUSPID VALVE MV Area (PHT): 4.68 cm TR Peak grad:   13.8 mmHg MV Decel Time: 162 msec TR Vmax:        186.00 cm/s  SHUNTS                         Systemic VTI:  0.09 m                         Systemic Diam: 2.00 cm Weston Brass MD Electronically signed by Weston Brass MD Signature Date/Time: 02/13/2021/5:40:25 PM    Final    Disposition   Pt is being discharged home today in good condition.  Follow-up Plans & Appointments     Follow-up Information     Netta Neat., NP Follow up on 03/02/2021.   Specialty: Cardiology Why: at 2:30pm for your follow up appt.  Contact information: [address] [place] [address] [phone]                Discharge Instructions     Amb Referral to Cardiac Rehabilitation   Complete by: As directed    Diagnosis:  Coronary Stents STEMI     After initial evaluation and assessments completed: Virtual Based Care may be provided alone or in conjunction with Phase 2 Cardiac Rehab based on patient barriers.: Yes   Call MD for:  difficulty breathing, headache or visual disturbances   Complete by: As directed    Call MD for:  persistant dizziness or light-headedness   Complete by: As directed    Call MD for:  redness, tenderness, or signs of infection (pain, swelling, redness, odor or green/yellow discharge around incision site)   Complete by: As directed    Diet - low sodium heart healthy   Complete by: As directed    Discharge instructions   Complete by: As directed    Radial Site Care Refer to this sheet in the next few weeks. These instructions provide you with information on caring for yourself after your procedure. Your caregiver may also give you more specific instructions. Your treatment has been planned according to current medical practices, but problems sometimes occur. Call your caregiver if you have any problems or questions after your procedure. HOME CARE INSTRUCTIONS You may shower the day after the procedure. Remove the bandage (dressing) and gently wash the site with plain soap and water. Gently pat the site  dry.  Do not apply powder or lotion to the site.  Do not submerge the affected site in water for 3 to 5 days.  Inspect the site at least twice daily.  Do not flex or bend the affected arm for 24 hours.  No lifting over 5 pounds (2.3 kg) for 5 days after your procedure.  Do not drive home if you are discharged the same day of the procedure. Have someone else drive you.  You may drive 24 hours after the procedure unless otherwise instructed by your caregiver.  What to expect: Any bruising will usually fade within 1 to 2 weeks.  Blood that collects in the tissue (hematoma) may be painful to the touch. It should usually decrease in size and tenderness within 1 to 2 weeks.  SEEK IMMEDIATE MEDICAL CARE IF: You have unusual pain at the radial site.  You have redness, warmth, swelling, or pain at the radial site.  You have drainage (other than a small amount of blood on the dressing).  You have chills.  You have a fever or persistent symptoms for more than 72 hours.  You have a fever and your symptoms suddenly get worse.  Your arm becomes  pale, cool, tingly, or numb.  You have heavy bleeding from the site. Hold pressure on the site.   Groin Site Care Refer to this sheet in the next few weeks. These instructions provide you with information on caring for yourself after your procedure. Your caregiver may also give you more specific instructions. Your treatment has been planned according to current medical practices, but problems sometimes occur. Call your caregiver if you have any problems or questions after your procedure. HOME CARE INSTRUCTIONS You may shower 24 hours after the procedure. Remove the bandage (dressing) and gently wash the site with plain soap and water. Gently pat the site dry.  Do not apply powder or lotion to the site.  Do not sit in a bathtub, swimming pool, or whirlpool for 5 to 7 days.  No bending, squatting, or lifting anything over 10 pounds (4.5 kg) as directed by your  caregiver.  Inspect the site at least twice daily.  Do not drive home if you are discharged the same day of the procedure. Have someone else drive you.  You may drive 24 hours after the procedure unless otherwise instructed by your caregiver.  What to expect: Any bruising will usually fade within 1 to 2 weeks.  Blood that collects in the tissue (hematoma) may be painful to the touch. It should usually decrease in size and tenderness within 1 to 2 weeks.  SEEK IMMEDIATE MEDICAL CARE IF: You have unusual pain at the groin site or down the affected leg.  You have redness, warmth, swelling, or pain at the groin site.  You have drainage (other than a small amount of blood on the dressing).  You have chills.  You have a fever or persistent symptoms for more than 72 hours.  You have a fever and your symptoms suddenly get worse.  Your leg becomes pale, cool, tingly, or numb.  You have heavy bleeding from the site. Hold pressure on the site. Marland Kitchen  PLEASE DO NOT MISS ANY DOSES OF YOUR PLAVIX!!!!! Also keep a log of you blood pressures and bring back to your follow up appt. Please call the office with any questions.   Patients taking blood thinners should generally stay away from medicines like ibuprofen, Advil, Motrin, naproxen, and Aleve due to risk of stomach bleeding. You may take Tylenol as directed or talk to your primary doctor about alternatives.  Some studies suggest Prilosec/Omeprazole interacts with Plavix. We changed your Prilosec/Omeprazole to the equivalent dose of Protonix for less chance of interaction.  PLEASE ENSURE THAT YOU DO NOT RUN OUT OF YOUR PLAVIX. This medication is very important to remain on for at least one year. IF you have issues obtaining this medication due to cost please CALL the office 3-5 business days prior to running out in order to prevent missing doses of this medication.   You will only need to take aspirin for 2 weeks, then stop. Continue your plavix and eliquis.    Increase activity slowly   Complete by: As directed        Discharge Medications   Allergies as of 02/17/2021       Reactions   Bee Venom Shortness Of Breath   BEE STINGS - dizziness    Corn-containing Products Anaphylaxis   Confirmed with pt that he avoids all corn containing products. SS 04/30/20 Patient states cooking spray is not an issue   Citrullus Vulgaris Other (See Comments)   Other reaction(s): GI Upset (intolerance)   Codeine Other (See Comments)  REACTION: muscle cramps   Invert Sugar Other (See Comments)   Unknown reaction   Rice Other (See Comments)   Unknown reaction   Vanilla Diarrhea        Medication List     TAKE these medications    aspirin 81 MG EC tablet Take 1 tablet (81 mg total) by mouth daily. Swallow whole. Start taking on: February 18, 2021   atorvastatin 80 MG tablet Commonly known as: LIPITOR Take 1 tablet (80 mg total) by mouth daily. Start taking on: February 18, 2021   carvedilol 3.125 MG tablet Commonly known as: COREG Take 1 tablet (3.125 mg total) by mouth 2 (two) times daily. What changed:  medication strength how much to take   clopidogrel 75 MG tablet Commonly known as: PLAVIX Take 1 tablet (75 mg total) by mouth daily with breakfast. Start taking on: February 18, 2021   Eliquis 5 MG Tabs tablet Generic drug: apixaban Take 1 tablet (5 mg total) by mouth 2 (two) times daily.   nitroGLYCERIN 0.4 MG SL tablet Commonly known as: Nitrostat Place 1 tablet (0.4 mg total) under the tongue every 5 (five) minutes as needed.   pantoprazole 40 MG tablet Commonly known as: PROTONIX Take 1 tablet (40 mg total) by mouth daily at 6 (six) AM. Start taking on: February 18, 2021   sacubitril-valsartan 24-26 MG Commonly known as: ENTRESTO Take 1 tablet by mouth 2 (two) times daily.         Outstanding Labs/Studies   -- Echo in 3 months -- FLP/LFTs in 8 weeks -- BMET at follow up appt   Duration of Discharge Encounter    Greater than 30 minutes including physician time.  Signed, Laverda Page, NP 02/17/2021, 12:03 PM

## 2021-02-17 NOTE — Progress Notes (Deleted)
Received pt from room 18 with Heparin drip at 1800 units/h.Alert and oriented.On 1 L [place].Sats >95%.Will continue to monitor.

## 2021-02-17 NOTE — Telephone Encounter (Signed)
Patient has a TOC follow-up appointment scheduled for 03/02/21 at 2:30 PM with Rennis Harding, NP, per Laverda Page, NP.

## 2021-02-17 NOTE — TOC Transition Note (Signed)
Transition of Care [facility]) - CM/SW Discharge Note   Patient Details  Name: [name] MRN: [id number] Date of Birth: Jun 15, 1941  [facility] CM/SW Contact:  Terrial Rhodes, LCSWA Phone Number: 02/17/2021, 12:53 PM   Clinical Narrative:     Patient will DC to:[address] [place] [address]  Anticipated DC date: 02/17/2021  Family notified: Patient declined  Transport by: Tressie Ellis Transportation   ?  Per MD patient ready for DC to [address] [place] [address] . RN and patient notified of DC. [facility] transport requested for patient.  CSW signing off.          Patient Goals and CMS Choice        Discharge Placement                       Discharge Plan and Services                                     Social Determinants of Health (SDOH) Interventions     Readmission Risk Interventions No flowsheet data found.

## 2021-02-17 NOTE — Progress Notes (Signed)
Pt discharged home via [facility] transportation arranged by social work. Discharged via wheelchair with medication from pharmacy, discharge packet and education completed.   AAOx4, increasingly becoming irate and verbally threatening towards staff as well as threatening to leave AMA if we did not discharge by 1300. On RA, clear lungs, Afib/occasioanlly VP with PPM, BP WDL. Pt refused last set of vital signs before discharge.R radial and L groin sites CDI, dressings removed, and patient instructed about home care for sites. No evidence of hematoma. L FA PIV removed.   Ewan Grau RN

## 2021-02-17 NOTE — Progress Notes (Signed)
ANTICOAGULATION CONSULT NOTE - Follow Up Consult  Pharmacy Consult for heparin Indication: atrial fibrillation  Labs: Recent Labs    02/15/21 0119 02/15/21 1046 02/16/21 0123 02/17/21 0039  HGB 14.4  --  14.6 14.5  HCT 44.7  --  46.2 45.2  PLT 183  --  190 186  HEPARINUNFRC 0.16* 0.36 0.52 0.23*  CREATININE 1.07  --  1.27*  --     Assessment/Plan:  79yo male subtherapeutic on heparin though likely needs more time to accumulate, previously therapeutic at this rate. Will continue gtt at current rate of 1800 units/hr and check additional level.   [name], PharmD, BCPS  02/17/2021,2:14 AM

## 2021-02-17 NOTE — Progress Notes (Addendum)
  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 Medication recommendations:  2 weeks of aspirin, clopidogrel, and apixaban then discontinue aspirin  Entresto 24/26 mg twice daily  Carvedilol 3.125 mg twice daily --> uptitrate for rate control  High intensity statin therapy, atorvastatin 80 mg daily  Discontinue Pepto-Bismol  As OP, should consider adding spironolactone and SGLT2 therapy.  If no LV recovery, consider resynchronization therapy states patient has left bundle branch block (QRSD is 136 ms)  Needs basic metabolic panel on follow-up will be  Follow-up:  [name] in 7 days.

## 2021-02-19 NOTE — Telephone Encounter (Signed)
Brought medications to office but says he misplaced his d/c instructions. New AVS with d/c instructions printed and reviewed with patient. Medications reviewed. Did not bring entresto 24/26 and says he misplaced it. [facility] contacted and asked to refill entresto 24/26 mg for patient. Cost $37 for 1 months supply. Advised to pick up entresto from [place]. Praised patient for having all prescriptions except entresto. Verbalized understanding of plan.

## 2021-02-19 NOTE — Telephone Encounter (Signed)
Patient contacted regarding discharge from [facility] on 02/17/2021.  Patient understands to follow up with provider Rennis Harding, NP on 03/02/2021 at 2:30 pm at [facility]. Patient does not understand discharge instructions. Patient does not understand medications and regimen. Patient understands to bring all medications to this visit.  Advised patient to come to the office today with all medications and discharge instructions for review. Verbalized understanding.

## 2021-02-24 ENCOUNTER — Telehealth: Payer: Self-pay | Admitting: Family Medicine

## 2021-02-24 DIAGNOSIS — I429 Cardiomyopathy, unspecified: Secondary | ICD-10-CM

## 2021-02-24 DIAGNOSIS — Z79899 Other long term (current) drug therapy: Secondary | ICD-10-CM

## 2021-02-24 NOTE — Telephone Encounter (Signed)
Patient called would like Dr Diona Browner to call him in to [facility] some LASIXS. He said he has gained weight in the 2wks since he had his stint procedure at [facility]. Starting weight 210 lbs. currently 225 lbs. He said his biggest complaint is he does not know why he was taken off of the Lasixs and due to weight gain, he is having difficulty breathing. He can be reached at [phone].

## 2021-02-24 NOTE — Telephone Encounter (Signed)
Reports weight has gradually increased since being off furosemide and discharge from hospital. Hospital discharge weight is 217 lbs on 02/17/2021. Reports that he started weighing daily on this past Saturday.  Unable to give exact weight--says he didn't have access to chart at time of call Reports sob. Denies dizziness or chest pain Advised that message would be sent to provider and if symptoms got worse, to go to the ED for an evaluation. Verbalized understanding.

## 2021-02-25 ENCOUNTER — Ambulatory Visit: Payer: Self-pay | Admitting: *Deleted

## 2021-02-25 ENCOUNTER — Other Ambulatory Visit: Payer: Self-pay | Admitting: *Deleted

## 2021-02-25 DIAGNOSIS — Z79899 Other long term (current) drug therapy: Secondary | ICD-10-CM

## 2021-02-25 DIAGNOSIS — I429 Cardiomyopathy, unspecified: Secondary | ICD-10-CM

## 2021-02-25 MED ORDER — FUROSEMIDE 40 MG PO TABS: 40.0000 mg | ORAL_TABLET | Freq: Every day | ORAL | 2 refills | Status: DC

## 2021-02-25 NOTE — Progress Notes (Signed)
Cardiology Office Note  Date: 02/26/2021   ID: [name], DOB 1941-06-28, MRN [id number]  PCP:  Medicine, [facility]  Cardiologist:  [name], MD Electrophysiologist:  [name], MD   Chief Complaint: Hospital follow up STEMI  History of Present Illness: [name] is a 80 y.o. 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  Recent admission to [facility] 02/09/2021 with SOB / CP. Found to be in atrial fibrillation with RVR with elevated troponin c/w NSTEM. LHC was planned but patient left AMA and refused procedure.   Presented to [facility] 02/13/2021 with c/o of 2 days of CP. Hospital note stated pain had recurred after leaving AMA on 02/10/2021. He was transported emergently to [facility] for cardiac catheterization. PCI initially to RCA with DES x 1. Staged intervention with successful PCI with DES to mid LAD, and prox/mid circumflex. Initial plan to start triple therapy with ASA 81 mg x 2 weeks, Plavix 75 mg daily and Eliquis 5 mg po bid. Remained in atrial fibrillation with ChadsVasc of 3 . Rate controlled on BB, Eliquis 5 mg po bid. HF with EF 20-25% with suspected etiology mixed due to CAD and tachycardia mediated. Started Coreg 3.125 mg po bid and Entresto 24/26 mg po bid. Advised consideration of SGLT2 inhibitor and addition of spironolactone as outpatient.. Recheck Echo in 3 months. If no improvement, consider CRT as patient has known LBBB. To follow with Dr Graciela Husbands as outpatient. Atorvastatin 80 mg started. Check FLP in 8 weeks  He is here today for hospital follow-up status post recent STEMI with initial PCI to RCA and subsequent PCI to mid LAD and proximal to mid circumflex with DES .  He states today he feels short of breath.  States has been taking his medications as directed.  Blood pressure is on the  soft side at 94/62.  He denies any orthostatic symptoms.  Weight today is 225.  Has some minimal lower extremity edema.  He denies any anginal symptoms.  Denies any near syncopal or syncopal episodes.  States occasional dizziness.  Denies any CVA or TIA-like symptoms.  Past Medical History:  Diagnosis Date  . Anginal pain (HCC)   . Asthma   . Bronchitis   . 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 History of epididymitis   . History of recurrent UTIs   . History of ruptured appendix   . Paroxysmal atrial fibrillation (HCC)   . Peptic ulcer disease   . Pneumonia due to COVID-19 virus   . PONV (postoperative nausea and vomiting)   . Presence of permanent cardiac pacemaker   . Tachy-brady syndrome (HCC)    PPM (SJM) implanted by Dr Graciela Husbands 10/19/99, gen change 04/03/08    Past Surgical History:  Procedure Laterality Date  . APPENDECTOMY    . CARDIOVERSION [name] 02/05/2021   Procedure: CARDIOVERSION;  Surgeon: Jonelle Sidle, MD;  Location: [facility];  Service: Cardiovascular;  Laterality: [name];  . CORONARY STENT INTERVENTION [name] 02/16/2021   Procedure: CORONARY STENT INTERVENTION;  Surgeon: Marykay Lex, MD;  Location: [facility] INVASIVE CV LAB;  Service: Cardiovascular;  Laterality: [name];  . CORONARY/GRAFT ACUTE MI REVASCULARIZATION [name] 02/13/2021   Procedure: Coronary/Graft Acute MI Revascularization;  Surgeon: Lennette Bihari, MD;  Location: [facility] INVASIVE CV LAB;  Service: Cardiovascular;  Laterality: [name];  . EP IMPLANTABLE DEVICE [name] 11/17/2016  Procedure: PPM Generator Changeout;  Surgeon: Marinus Maw, MD;  Location: [facility] INVASIVE CV LAB;  Service: Cardiovascular;  Laterality: [name];  . INSERT / REPLACE / REMOVE PACEMAKER    . LEFT HEART CATH [name] 02/16/2021   Procedure: Left Heart Cath;  Surgeon: Marykay Lex, MD;  Location: [facility] INVASIVE CV LAB;  Service: Cardiovascular;  Laterality: [name];  . LEFT HEART CATH AND CORONARY ANGIOGRAPHY [name] 02/13/2021   Procedure: LEFT HEART CATH  AND CORONARY ANGIOGRAPHY;  Surgeon: Lennette Bihari, MD;  Location: [facility] INVASIVE CV LAB;  Service: Cardiovascular;  Laterality: [name];  . PACEMAKER PLACEMENT  03/2008   [employer], [place]. 1941    Current Outpatient Medications  Medication Sig Dispense Refill  . atorvastatin (LIPITOR) 80 MG tablet Take 1 tablet (80 mg total) by mouth daily. 90 tablet 0  . carvedilol (COREG) 3.125 MG tablet Take 1 tablet (3.125 mg total) by mouth 2 (two) times daily. 180 tablet 0  . clopidogrel (PLAVIX) 75 MG tablet Take 1 tablet (75 mg total) by mouth daily with breakfast. 90 tablet 2  . ELIQUIS 5 MG TABS tablet Take 1 tablet (5 mg total) by mouth 2 (two) times daily. 60 tablet 2  . furosemide (LASIX) 40 MG tablet Take 1 tablet (40 mg total) by mouth daily. 30 tablet 2  . nitroGLYCERIN (NITROSTAT) 0.4 MG SL tablet Place 1 tablet (0.4 mg total) under the tongue every 5 (five) minutes as needed. 25 tablet 2  . sacubitril-valsartan (ENTRESTO) 24-26 MG Take 1 tablet by mouth 2 (two) times daily. 60 tablet 2   No current facility-administered medications for this visit.   Allergies:  Bee venom, Corn-containing products, Citrullus vulgaris, Codeine, Invert sugar, Rice, and Vanilla   Social History: The patient  reports that he quit smoking about 43 years ago. His smoking use included cigarettes. He started smoking about 61 years ago. He has a 30.00 pack-year smoking history. He has never used smokeless tobacco. He reports that he does not drink alcohol and does not use drugs.   Family History: The patient's family history includes Cancer in his father; Suicidality in his mother.   ROS:  Please see the history of present illness. Otherwise, complete review of systems is positive for none.  All other systems are reviewed and negative.   Physical Exam: VS:  BP 94/62   Pulse (!) 56   Ht 5\' 10"  (1.778 m)   Wt 225 lb 3.2 oz (102.2 kg)   SpO2 96%   BMI 32.31 kg/m , BMI Body mass index is 32.31 kg/m.  Wt Readings  from Last 3 Encounters:  02/26/21 225 lb 3.2 oz (102.2 kg)  02/13/21 217 lb 9.5 oz (98.7 kg)  02/04/21 212 lb (96.2 kg)    General: Patient appears comfortable at rest. Neck: Supple, no elevated JVP or carotid bruits, no thyromegaly. Lungs: Clear to auscultation, nonlabored breathing at rest. Cardiac: Irregularly irregular rate and rhythm, no S3 or significant systolic murmur, no pericardial rub. Extremities: No pitting edema, distal pulses 2+. Skin: Warm and dry. Musculoskeletal: No kyphosis. Neuropsychiatric: Alert and oriented x3, affect grossly appropriate.  ECG:  EKG 02/16/2021 atrial fibrillation with occasional ventricular paced complexes, left axis deviation, left bundle branch block, rate of 87.  Recent Labwork: 03/03/2020: B Natriuretic Peptide 214.6 03/04/2020: Magnesium 2.0 01/08/2021: TSH 2.395 02/13/2021: ALT 30; AST 29 02/17/2021: BUN 16; Creatinine, Ser 1.14; Hemoglobin 14.5; Platelets 186; Potassium 4.6; Sodium 136     Component Value Date/Time   CHOL  112 02/13/2021 0425   TRIG 48 02/13/2021 0425   HDL 29 (L) 02/13/2021 0425   CHOLHDL 3.9 02/13/2021 0425   VLDL 10 02/13/2021 0425   LDLCALC 73 02/13/2021 0425   LDLDIRECT 147.9 08/30/2008 0842    Other Studies Reviewed Today: Cath: 02/13/21   Prox Cx lesion is 80% stenosed.  Prox Cx to Mid Cx lesion is 90% stenosed.  Prox LAD to Mid LAD lesion is 40% stenosed.  Mid LAD-1 lesion is 95% stenosed.  Mid LAD-2 lesion is 20% stenosed.  Prox RCA lesion is 20% stenosed.  Prox RCA to Mid RCA lesion is 100% stenosed.  Post intervention, there is a 0% residual stenosis.  A stent was successfully placed.  ST segment elevation MI with total occlusion of the large mid RCA with thrombus and initial TIMI 0 flow.  Severe concomitant multivessel disease with 40% proximal LAD stenosis followed by 90 to 95% stenosis; and 80 and 90% proximal circumflex stenoses.  LVEDP 21 mmHg  Successful PCI to the totally occluded  RCA with ultimate insertion of a 3.5x18 mm Resolute Onyx stent postdilated to 3.55 mm with resumption of brisk TIMI-3 flow resulting in a large dominant RCA with the 100% occlusion being reduced to 0%. There is mild 30% stenosis in the distal RCA before the PDA takeoff with 30% proximal PDA stenoses.  Recommendation: At present we will initiate aspirin/Plavix and hold Eliquis. The patient will need staged intervention to his LAD and circumflex vessels. He has reported EF in the 20 to 25% range. Hopefully LV function may improve following revascularization. The patient's warfarin dose had been discontinued at [facility] earlier in the week. The patient was uncertain when his last dose of Eliquis was but he did not take it this evening. Continue optimal therapy for HFrEF, and aggressive lipid-lowering therapy with target LDL less than 70.  Diagnostic Dominance: Right    Intervention     Cath: 02/16/21   LV end diastolic pressure is mildly elevated.  -------------------------------  Lesion Segment #1: Prox LAD to Mid LAD lesion is 40% stenosed. Mid LAD-1 lesion is 95% stenosed.  A drug-eluting stent was successfully placed covering both lesions, using a STENT RESOLUTE ONYX 3.0X30 -> postdilated to 3.3 mm  Post intervention, there is a 0% residual stenosis throughout.  ---------------------------  Suezanne Jacquet Cx lesion is 50% stenosed. -> The intention was to avoid during this lesion.  Lesion Segment #2: Prox Cx lesion is 80% stenosed. Prox Cx to Mid Cx lesion is 90% stenosed.  A drug-eluting stent was successfully placed covering both lesions, using a STENT RESOLUTE ONYX 2.5X30. Postdilated 2.65 mm.  Post intervention, there is a 0% residual stenosis throughout..  SUMMARY 1. Successful two-vessel PCI: ? Mid LAD: Resolute Onyx DES 3.0 mm x 30 mm postdilated to 3.3 mm ? Proximal to mid LCx: Resolute Onyx DES 2.5 mm x 30 mm postdilated to 2.65 mm 2. LVEDP 18  mm   RECOMMENDATIONS  Would strongly consider at least the short-term use of Eliquis as opposed to warfarin to facilitate ease of discharge  Initial thoughts of potential discharge today, however with his uncontrolled A. fib rate, I suspect that he may need another day or so of management.  We will gently hydrate, may need diuretic at the end of the day.  Bryan Lemma, M.D., M.S.  Diagnostic Dominance: Right    Intervention      Echo: 02/13/21  IMPRESSIONS    1. Left ventricular ejection fraction, by estimation, is 25%.  The left  ventricle has severely decreased function. The left ventricle demonstrates  global hypokinesis. There is mild left ventricular hypertrophy. Left  ventricular diastolic parameters are  indeterminate.  2. Right ventricular systolic function is severely reduced. The right  ventricular size is mildly enlarged. There is normal pulmonary artery  systolic pressure.  3. Left atrial size was mildly dilated.  4. Right atrial size was mildly dilated.  5. The mitral valve is grossly normal. Mild mitral valve regurgitation.  6. The aortic valve is grossly normal. Aortic valve regurgitation is not  visualized. No aortic stenosis is present.  7. The inferior vena cava is dilated in size with <50% respiratory  variability, suggesting right atrial pressure of 15 mmHg.   Conclusion(s)/Recommendation(s): No left ventricular mural or apical  thrombus/thrombi. Noncontrast images suggest fixed calcified apical  structure, which is not seen on contrast images.    Assessment and Plan:  1. ST elevation myocardial infarction involving right coronary artery (HCC)   2. Persistent atrial fibrillation (HCC)   3. Chronic systolic heart failure (HCC)   4. Tachycardia-bradycardia syndrome (HCC)   5. Hyperlipidemia with target LDL less than 70    1. ST elevation myocardial infarction involving right coronary artery (HCC) Recent ST elevation MI was  subsequent stenting of RCA, proximal/mid circumflex, mid LAD.  Currently denies any anginal symptoms but states he feels short of breath.  Heart rate today is 56.  Continue aspirin 81 mg until April 5 which will make 2 weeks then discontinue.  Continue Plavix 75 mg daily.  Continue sublingual nitroglycerin as needed.  2. Persistent atrial fibrillation (HCC) Heart rate controlled today at 56.  Continue Eliquis 5 mg p.o. twice daily.  Continue carvedilol 3.125 mg p.o. twice daily.  3. Chronic systolic heart failure (HCC) Recent echo during hospital admission on 02/13/2021 showed EF of 25% LV severely decreased function, LV demonstrates global hypokinesis.  Mild LVH.  Diastolic parameters indeterminate.  RV systolic function severely reduced.  RV mildly enlarged.  LA mildly dilated, RA mildly dilated, mild MR. Continue Entresto 24/26 mg p.o. twice daily.  Continue furosemide 40 mg daily.  Continue carvedilol 3.125 mg p.o. twice daily.  Repeat echocardiogram in 3 months.  4. Tachycardia-bradycardia syndrome (HCC) Pacemaker in place.  Continue to follow EP Dr. Graciela Husbands 01/09/2021 5:01 PM EST      Device remote reviewed. Remote is normal. Battery status is good. Lead measurements unchanged. Histograms are appropriate.     5. Hyperlipidemia with target LDL less than 70 Continue atorvastatin 80 mg daily.  Get FLP and LFTs in 6 weeks.  Medication Adjustments/Labs and Tests Ordered: Current medicines are reviewed at length with the patient today.  Concerns regarding medicines are outlined above.   Disposition: Follow-up with [name] or APP 3 months  Signed, Rennis Harding, NP 02/26/2021 2:42 PM    [facility] [address] [place], [place], [place] [address] Phone: [phone]; Fax: [phone]

## 2021-02-25 NOTE — Telephone Encounter (Addendum)
Patient informed and verbalized understanding of plan. Lab order faxed to [facility]. 

## 2021-02-25 NOTE — Addendum Note (Signed)
Addended by: Eustace Moore on: 02/25/2021 08:14 AM   Modules accepted: Orders

## 2021-02-25 NOTE — Telephone Encounter (Signed)
Thank you for the update.  I reviewed his most recent lab work around hospital discharge.  Let's start him back on Lasix 40 mg daily.  Keep follow-up visit as scheduled on April 4 and would get BMET for that visit.

## 2021-02-26 ENCOUNTER — Encounter: Payer: Self-pay | Admitting: Family Medicine

## 2021-02-26 ENCOUNTER — Ambulatory Visit (INDEPENDENT_AMBULATORY_CARE_PROVIDER_SITE_OTHER): Payer: Medicare Other | Admitting: Family Medicine

## 2021-02-26 ENCOUNTER — Other Ambulatory Visit: Payer: Self-pay

## 2021-02-26 VITALS — BP 94/62 | HR 56 | Ht 70.0 in | Wt 225.2 lb

## 2021-02-26 DIAGNOSIS — I2111 ST elevation (STEMI) myocardial infarction involving right coronary artery: Secondary | ICD-10-CM | POA: Diagnosis not present

## 2021-02-26 DIAGNOSIS — E785 Hyperlipidemia, unspecified: Secondary | ICD-10-CM

## 2021-02-26 DIAGNOSIS — I4819 Other persistent atrial fibrillation: Secondary | ICD-10-CM | POA: Diagnosis not present

## 2021-02-26 DIAGNOSIS — I5022 Chronic systolic (congestive) heart failure: Secondary | ICD-10-CM | POA: Diagnosis not present

## 2021-02-26 DIAGNOSIS — I495 Sick sinus syndrome: Secondary | ICD-10-CM

## 2021-02-26 NOTE — Patient Instructions (Addendum)
Medication Instructions:   Stop the Aspirin on 03/03/2021.  Continue all other medications.    Labwork:  FLP - due in 6 weeks.   Will mail reminder when tim.   Testing/Procedures: Your physician has requested that you have an echocardiogram. Echocardiography is a painless test that uses sound waves to create images of your heart. It provides your doctor with information about the size and shape of your heart and how well your heart's chambers and valves are working. This procedure takes approximately one hour. There are no restrictions for this procedure - DUE IN 3 MONTHS JUST PRIOR TO NEXT VISIT   Follow-Up: 3 months   Any Other Special Instructions Will Be Listed Below (If Applicable).  If you need a refill on your cardiac medications before your next appointment, please call your pharmacy.

## 2021-03-02 ENCOUNTER — Ambulatory Visit: Payer: Medicare Other | Admitting: Family Medicine

## 2021-03-05 ENCOUNTER — Telehealth: Payer: Self-pay | Admitting: *Deleted

## 2021-03-05 ENCOUNTER — Telehealth: Payer: Self-pay | Admitting: Cardiology

## 2021-03-05 NOTE — Telephone Encounter (Signed)
-----  Message from Satira Sark, MD sent at 03/05/2021  3:15 PM EDT ----- Results reviewed.  Creatinine 1.33 and potassium 4.3.  Creatinine has increased from 1.14 in March.  If he has been taking Lasix 40 mg daily, would see if we could cut the dose in half for now.  Sodium 135 - 145 mmol/L 143  Potassium 3.5 - 5.0 mmol/L 4.3  Chloride 98 - 107 mmol/L 108High  CO2 21.0 - 32.0 mmol/L 21.4  Anion Gap 3 - 11 mmol/L 14High  BUN 8 - 20 mg/dL 21High  Creatinine 0.80 - 1.30 mg/dL 1.33High  BUN/Creatinine Ratio  16  EGFR CKD-EPI Non-African American, Male mL/min/1.47m2 51  EGFR CKD-EPI African American, Male mL/min/1.33m2 59  Glucose 70 - 179 mg/dL 121  Calcium 8.5 - 10.1 mg/dL 9.5

## 2021-03-05 NOTE — Telephone Encounter (Signed)
Opened in error

## 2021-03-05 NOTE — Telephone Encounter (Signed)
Patient informed and verbalized understanding of plan. Copy sent to PCP 

## 2021-04-13 ENCOUNTER — Other Ambulatory Visit: Payer: Self-pay | Admitting: *Deleted

## 2021-04-13 ENCOUNTER — Encounter: Payer: Self-pay | Admitting: *Deleted

## 2021-04-13 DIAGNOSIS — E785 Hyperlipidemia, unspecified: Secondary | ICD-10-CM

## 2021-04-30 NOTE — Progress Notes (Addendum)
Cardiology Office Note  Date: 05/01/2021   ID: Cameron KluverJerry A Corker, DOB 1941/11/01, MRN [id number]  PCP:  Med[facility], [facility]  Cardiologist:  Nona DellSamuel McDowell, MD Electrophysiologist:  Sherryl MangesSteven Klein, MD   Chief Complaint: Hospital follow up STEMI  History of Present Illness: [name] is a 80 y.o. 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  [facility] with SOB / CP. Found to be in atrial fibrillation with RVR with elevated troponin c/w NSTEM. LHC was planned but patient left AMA and refused procedure.   [facility] 02/13/2021 with c/o of 2 days of CP. Hospital note stated pain had recurred after leaving AMA on 02/10/2021. He was [facility] emerg[facility] for cardiac catheterization. PCI initially to RCA with DES x 1. Staged intervention with successful PCI with DES to mid LAD, and prox/mid circumflex. Initial plan to start triple therapy with ASA 81 mg x 2 weeks, Plavix 75 mg daily and Eliquis 5 mg po bid. Remained in atrial fibrillation with ChadsVasc of 3 . Rate controlled on BB, Eliquis 5 mg po bid. HF with EF 20-25% with suspected etiology mixed due to CAD and tachycardia mediated. Started Coreg 3.125 mg po bid and Entresto 24/26 mg po bid. Advised consideration of SGLT2 inhibitor and addition of spironolactone as outpatient.. Recheck Echo in 3 months. If no improvement, consider CRT as patient has known LBBB. To follow with Dr Graciela HusbandsKlein as outpatient. Atorvastatin 80 mg started. Check FLP in 8 weeks  He was last herefor hospital follow-up status post recent STEMI with initial PCI to RCA and subsequent PCI to mid LAD and proximal to mid circumflex with DES .  He states today he feels short of breath.  States has been taking his medications as directed.  Blood pressure is on the soft  side at 94/62.  He denies any orthostatic symptoms.  Weight today is 225.  Has some minimal lower extremity edema.  He denies any anginal symptoms.  Denies any near syncopal or syncopal episodes.  States occasional dizziness.  Denies any CVA or TIA-like symptoms.   Recently presented [facility] 03/11/2021 emergency department with altered mental status.  He was found unresponsive by EMS.  He was in respiratory distress.  He had wide-complex tachycardia on the monitor concerning for ventricular tachycardia.  EKG showed atrial fibrillation with RVR / V. tach.  He was started on amiodarone.  Chest x-ray revealed interstitial edema.  Lasix was started.  His blood pressure was low and he was started on Levophed.  He had a fever of 104.  He was eventually [facility] after being intubated for acute hypoxic respiratory failure.  He was diagnosed with septicemia and influenza.  His proBNP was 23,736.  Creatinine was 2.02, GFR was 31.  [facility] diagnosis of acute respiratory failure with hypoxia, CHF class III, atrial fibrillation and atrial flutter, influenza A.  His echocardiogram on 03/12/2021 showed EF of 15 to 20% he was evaluated by cardiology and started on IV milrinone, IV Lasix, captopril for several days.  He was weaned off medications and started on oral Cozaar, Lasix, digoxin and amiodarone.  He was receiving anticoagulation with Eliquis.  He was discharged on amiodarone 200 mg daily, Digoxin 0.125 daily, Cozaar 12.5 mg p.o. daily.  He was continuing Lipitor 80 mg daily, Plavix 75  mg daily, Eliquis 5 mg p.o. twice daily, Lasix 40 mg daily, sublingual nitroglycerin 0.4 Protonix 40 mg daily.  He is here for follow-up status post recent hospitalization.  He is currently residing at [facility] undergoing rehab.  He denies any recent issues.  His weight has decreased significantly since March 31 when he weighed 225.  His weight today is 178.  He exhibits no signs of  fluid overload i.e. shortness of breath, weight gain, lower extremity edema.  He denies any anginal symptoms, SOB or DOE, PND, orthopnea, claudication-like symptoms, DVT or PE-like symptoms, or lower extremity edema.  No bleeding on Eliquis.  Blood pressure is on the soft side at 100/62 heart rate of 64.  His Entresto was stopped at discharge from recent hospitalization at [facility] and was replaced with losartan.   Past Medical History:  Diagnosis Date   Anginal pain (HCC)    Asthma    Bronchitis    Cardiomyopathy (HCC)    Possibly tachycardia mediated   History of epididymitis    History of recurrent UTIs    History of ruptured appendix    Paroxysmal atrial fibrillation (HCC)    Peptic ulcer disease    Pneumonia due to COVID-19 virus    PONV (postoperative nausea and vomiting)    Presence of permanent cardiac pacemaker    Tachy-brady syndrome (HCC)    PPM (SJM) implanted by Dr Graciela Husbands 10/19/99, gen change 04/03/08    Past Surgical History:  Procedure Laterality Date   APPENDECTOMY     CARDIOVERSION [name] 02/05/2021   Procedure: CARDIOVERSION;  Surgeon: Jonelle Sidle, MD;  Location: [facility];  Service: Cardiovascular;  Laterality: [name];   CORONARY STENT INTERVENTION [name] 02/16/2021   Procedure: CORONARY STENT INTERVENTION;  Surgeon: Marykay Lex, MD;  Location: [facility] INVASIVE CV LAB;  Service: Cardiovascular;  Laterality: [name];   CORONARY/GRAFT ACUTE MI REVASCULARIZATION [name] 02/13/2021   Procedure: Coronary/Graft Acute MI Revascularization;  Surgeon: Lennette Bihari, MD;  Location: [facility] INVASIVE CV LAB;  Service: Cardiovascular;  Laterality: [name];   EP IMPLANTABLE DEVICE [name] 11/17/2016   Procedure: PPM Generator Changeout;  Surgeon: Marinus Maw, MD;  Location: [facility] INVASIVE CV LAB;  Service: Cardiovascular;  Laterality: [name];   INSERT / REPLACE / REMOVE PACEMAKER     LEFT HEART CATH [name] 02/16/2021   Procedure: Left Heart Cath;  Surgeon: Marykay Lex, MD;  Location: [facility]  INVASIVE CV LAB;  Service: Cardiovascular;  Laterality: [name];   LEFT HEART CATH AND CORONARY ANGIOGRAPHY [name] 02/13/2021   Procedure: LEFT HEART CATH AND CORONARY ANGIOGRAPHY;  Surgeon: Lennette Bihari, MD;  Location: [facility] INVASIVE CV LAB;  Service: Cardiovascular;  Laterality: [name];   PACEMAKER PLACEMENT  03/2008   [facility], [place]. [address]    Current Outpatient Medications  Medication Sig Dispense Refill   amiodarone (PACERONE) 200 MG tablet Take 200 mg by mouth daily.     atorvastatin (LIPITOR) 80 MG tablet Take 1 tablet (80 mg total) by mouth daily. 90 tablet 0   clopidogrel (PLAVIX) 75 MG tablet Take 1 tablet (75 mg total) by mouth daily with breakfast. 90 tablet 2   digoxin (LANOXIN) 0.125 MG tablet Take 0.125 mg by mouth daily.     ELIQUIS 5 MG TABS tablet Take 1 tablet (5 mg total) by mouth 2 (two) times daily. 60 tablet 2   furosemide (LASIX) 40 MG tablet Take 1 tablet (40 mg total) by mouth daily. (Patient taking differently: Take 20 mg by  mouth daily.) 30 tablet 2   losartan (COZAAR) 25 MG tablet Take 12.5 mg by mouth daily.     nitroGLYCERIN (NITROSTAT) 0.4 MG SL tablet Place 1 tablet (0.4 mg total) under the tongue every 5 (five) minutes as needed. (Patient taking differently: Place 0.4 mg under the tongue every 5 (five) minutes x 3 doses as needed for chest pain (if no relief after 2nd dose, proceed to the ED for an evaluation or call 911).) 25 tablet 2   pantoprazole (PROTONIX) 40 MG tablet TAKE 1 TABLET (40 MG TOTAL) BY MOUTH DAILY AT 6 (SIX) AM. 30 tablet 1   No current facility-administered medications for this visit.   Allergies:  Bee venom, Corn-containing products, Citrullus vulgaris, Codeine, Invert sugar, Rice, and Vanilla   Social History: The patient  reports that he quit smoking about 43 years ago. His smoking use included cigarettes. He started smoking about 61 years ago. He has a 30.00 pack-year smoking history. He has never used smokeless tobacco. He reports that he does  not drink alcohol and does not use drugs.   Family History: The patient's family history includes Cancer in his father; Suicidality in his mother.   ROS:  Please see the history of present illness. Otherwise, complete review of systems is positive for none.  All other systems are reviewed and negative.   Physical Exam: VS:  BP 100/62   Pulse 64   Ht 5\' 10"  (1.778 m)   Wt 178 lb (80.7 kg)   SpO2 97%   BMI 25.54 kg/m , BMI Body mass index is 25.54 kg/m.  Wt Readings from Last 3 Encounters:  05/01/21 178 lb (80.7 kg)  02/26/21 225 lb 3.2 oz (102.2 kg)  02/13/21 217 lb 9.5 oz (98.7 kg)    General: Patient appears comfortable at rest. Neck: Supple, no elevated JVP or carotid bruits, no thyromegaly. Lungs: Clear to auscultation, nonlabored breathing at rest. Cardiac: Irregularly irregular rate and rhythm, no S3 or significant systolic murmur, no pericardial rub. Extremities: No pitting edema, distal pulses 2+. Skin: Warm and dry. Musculoskeletal: No kyphosis. Neuropsychiatric: Alert and oriented x3, affect grossly appropriate.  ECG:  EKG 02/16/2021 atrial fibrillation with occasional ventricular paced complexes, left axis deviation, left bundle branch block, rate of 87.  Recent Labwork: 01/08/2021: TSH 2.395 02/13/2021: ALT 30; AST 29 02/17/2021: BUN 16; Creatinine, Ser 1.14; Hemoglobin 14.5; Platelets 186; Potassium 4.6; Sodium 136     Component Value Date/Time   CHOL 112 02/13/2021 0425   TRIG 48 02/13/2021 0425   HDL 29 (L) 02/13/2021 0425   CHOLHDL 3.9 02/13/2021 0425   VLDL 10 02/13/2021 0425   LDLCALC 73 02/13/2021 0425   LDLDIRECT 147.9 08/30/2008 0842    Other Studies Reviewed Today:       Echocardiogram 03/12/2021 [facility]  Narrative The left ventricular systolic function was severely abnormal with an EF of 15-20%. There was no significant valvular pathology.   Left Ventricle  Left ventricle size is normal. Wall thickness is normal. EF: 15-20%. Wall motion  is normal. Unable to assess diastolic function due to underlying atrial flutter.   Right Ventricle  Right ventricle is mildly dilated. Systolic function is moderately reduced. A pacing/ICD lead is noted in the right ventricle.   Left Atrium  Left atrium is normal in size.   Right Atrium  Normal sized right atrium.   Mitral Valve  Mitral valve structure is normal. There is no mitral regurgitation.   Tricuspid Valve  Tricuspid valve structure  is normal. There is mild regurgitation. The right ventricular systolic pressure is normal (<36 mmHg).   Aortic Valve  The aortic valve is tricuspid. The leaflets are not thickened and exhibit normal excursion. The leaflets are mildly calcified. There is no regurgitation or stenosis.   Pulmonic Valve  The pulmonic valve was not well visualized. Trace regurgitation.   Ascending Aorta  The aortic root is normal in size.   Pericardium  There is no pericardial effusion. There is a left pleural effusion.   Study Details  A complete echo was performed using complete 2D, color flow Doppler and spectral Doppler. During the study the apical, parasternal and subcostal view was captured. Definity contrast was injected during the study. Overall the study quality was adequate. The study was technically difficult. The study was difficult due to patient's clinical status and body habitus.   Wall Scoring Baseline  Score Index: 2.65  The following segments are dyskinetic: basal anteroseptal, basal inferoseptal, mid anteroseptal, mid inferoseptal and apical septal.  The following segments are akinetic: mid anterolateral.  The following segments are hypokinetic: basal anterior, basal inferior, basal inferolateral, basal anterolateral, mid anterior, mid inferior, mid inferolateral, apical anterior, apical inferior, apical lateral and apex.   Cath: 02/13/21   Prox Cx lesion is 80% stenosed. Prox Cx to Mid Cx lesion is 90% stenosed. Prox LAD to Mid LAD lesion  is 40% stenosed. Mid LAD-1 lesion is 95% stenosed. Mid LAD-2 lesion is 20% stenosed. Prox RCA lesion is 20% stenosed. Prox RCA to Mid RCA lesion is 100% stenosed. Post intervention, there is a 0% residual stenosis. A stent was successfully placed.   ST segment elevation MI with total occlusion of the large mid RCA with thrombus and initial TIMI 0 flow.   Severe concomitant multivessel disease with 40% proximal LAD stenosis followed by 90 to 95% stenosis; and 80 and 90% proximal circumflex stenoses.   LVEDP 21 mmHg   Successful PCI to the totally occluded RCA with ultimate insertion of a 3.5x18 mm Resolute Onyx stent postdilated to 3.55 mm with resumption of brisk TIMI-3 flow resulting in a large dominant RCA with the 100% occlusion being reduced to 0%.  There is mild 30% stenosis in the distal RCA before the PDA takeoff with 30% proximal PDA stenoses.   Recommendation: At present we will initiate aspirin/Plavix and hold Eliquis.  The patient will need staged intervention to his LAD and circumflex vessels.  He has reported EF in the 20 to 25% range.  Hopefully LV function may improve following revascularization.  The patient's warfarin dose had been discontinued at [facility] earlier in the week.  The patient was uncertain when his last dose of Eliquis was but he did not take it this evening.  Continue optimal therapy for HFrEF, and aggressive lipid-lowering therapy with target LDL less than 70.   Diagnostic Dominance: Right      Intervention        Cath: 02/16/21   LV end diastolic pressure is mildly elevated. ------------------------------- Lesion Segment #1: Prox LAD to Mid LAD lesion is 40% stenosed. Mid LAD-1 lesion is 95% stenosed. A drug-eluting stent was successfully placed covering both lesions, using a STENT RESOLUTE ONYX 3.0X30 -> postdilated to 3.3 mm Post intervention, there is a 0% residual stenosis throughout. --------------------------- Suezanne Jacquet Cx lesion is 50%  stenosed. -> The intention was to avoid during this lesion. Lesion Segment #2: Prox Cx lesion is 80% stenosed. Prox Cx to Mid Cx lesion is 90% stenosed. A drug-eluting  stent was successfully placed covering both lesions, using a STENT RESOLUTE ONYX 2.5X30. Postdilated 2.65 mm. Post intervention, there is a 0% residual stenosis throughout..   SUMMARY Successful two-vessel PCI: Mid LAD: Resolute Onyx DES 3.0 mm x 30 mm postdilated to 3.3 mm Proximal to mid LCx: Resolute Onyx DES 2.5 mm x 30 mm postdilated to 2.65 mm LVEDP 18 mm     RECOMMENDATIONS Would strongly consider at least the short-term use of Eliquis as opposed to warfarin to facilitate ease of discharge Initial thoughts of potential discharge today, however with his uncontrolled A. fib rate, I suspect that he may need another day or so of management. We will gently hydrate, may need diuretic at the end of the day.   Bryan Lemma, M.D., M.S.   Diagnostic Dominance: Right      Intervention          Echo: 02/13/21   IMPRESSIONS     1. Left ventricular ejection fraction, by estimation, is 25%. The left  ventricle has severely decreased function. The left ventricle demonstrates  global hypokinesis. There is mild left ventricular hypertrophy. Left  ventricular diastolic parameters are  indeterminate.   2. Right ventricular systolic function is severely reduced. The right  ventricular size is mildly enlarged. There is normal pulmonary artery  systolic pressure.   3. Left atrial size was mildly dilated.   4. Right atrial size was mildly dilated.   5. The mitral valve is grossly normal. Mild mitral valve regurgitation.   6. The aortic valve is grossly normal. Aortic valve regurgitation is not  visualized. No aortic stenosis is present.   7. The inferior vena cava is dilated in size with <50% respiratory  variability, suggesting right atrial pressure of 15 mmHg.   Conclusion(s)/Recommendation(s): No left ventricular  mural or apical  thrombus/thrombi. Noncontrast images suggest fixed calcified apical  structure, which is not seen on contrast images.    Assessment and Plan:  1. CAD in native artery   2. ST elevation myocardial infarction involving right coronary artery (HCC)   3. Persistent atrial fibrillation (HCC)   4. Acute on chronic systolic heart failure (HCC)   5. Tachycardia-bradycardia syndrome (HCC)   6. Hyperlipidemia with target LDL less than 70    1.  S/P ST elevation myocardial infarction involving right coronary artery (HCC) Recent ST elevation MI was subsequent stenting of RCA, proximal/mid circumflex, mid LAD.  No current anginal symptoms.  Continue Plavix 75 mg daily, nitroglycerin sublingual as needed.  2. Persistent atrial fibrillation (HCC)/atrial flutter Heart rate controlled today at 64.  Continue Eliquis 5 mg p.o. twice daily.  Continue amiodarone 200 mg daily.  3. Acute on Chronic systolic heart failure (HCC) Recent echo at [facility] on 03/12/2021 demonstrated EF of 15 to 20%.  Normal wall motion, unable to assess diastolic function due to underlying atrial flutter.  Mild tricuspid regurgitation trace pulmonic regurgitation, left pleural effusion.  Continue furosemide 40 mg daily.  Continue losartan 12.5 mg daily.  Continue digoxin 0.125 mg p.o. daily.  Get follow-up echocardiogram in 3 months.  4. Tachycardia-bradycardia syndrome (HCC) Sees Dr. Graciela Husbands.  For pacemaker management.  Continue follow-up with Dr. Graciela Husbands. Last device check: Normal device function. 51 new VHR events; longest 1 hour 46 minutes w/ rates 160's bpm; EGMs suggest AF w/ RVR, AF burden 36%; EGMs suggest A. Fib - ongoing; overall V rates controlled. History of PAF and on carvedilol and warfarin.   5. Hyperlipidemia with target LDL less  than 70 Continue atorvastatin 80 mg daily.  Get FLP and LFTs in 6 weeks.  Medication Adjustments/Labs and Tests Ordered: Current medicines are  reviewed at length with the patient today.  Concerns regarding medicines are outlined above.   Disposition: Follow-up with [name] or APP 3 months  Signed, Rennis Harding, NP 05/01/2021 9:20 AM    [facility] [address] [place], [place], [place] [address] Phone: [phone]; Fax: [phone]

## 2021-05-01 ENCOUNTER — Other Ambulatory Visit: Payer: Self-pay

## 2021-05-01 ENCOUNTER — Encounter: Payer: Self-pay | Admitting: Family Medicine

## 2021-05-01 ENCOUNTER — Ambulatory Visit (INDEPENDENT_AMBULATORY_CARE_PROVIDER_SITE_OTHER): Payer: Medicare Other | Admitting: Family Medicine

## 2021-05-01 VITALS — BP 100/62 | HR 64 | Ht 70.0 in | Wt 178.0 lb

## 2021-05-01 DIAGNOSIS — I4819 Other persistent atrial fibrillation: Secondary | ICD-10-CM

## 2021-05-01 DIAGNOSIS — I2111 ST elevation (STEMI) myocardial infarction involving right coronary artery: Secondary | ICD-10-CM

## 2021-05-01 DIAGNOSIS — I5023 Acute on chronic systolic (congestive) heart failure: Secondary | ICD-10-CM | POA: Diagnosis not present

## 2021-05-01 DIAGNOSIS — I495 Sick sinus syndrome: Secondary | ICD-10-CM

## 2021-05-01 DIAGNOSIS — I2510 Atherosclerotic heart disease of native coronary artery without angina pectoris: Secondary | ICD-10-CM | POA: Diagnosis not present

## 2021-05-01 DIAGNOSIS — E785 Hyperlipidemia, unspecified: Secondary | ICD-10-CM

## 2021-05-01 NOTE — Patient Instructions (Addendum)
Medication Instructions:   Your physician recommends that you continue on your current medications as directed. Please refer to the Current Medication list given to you today.  Labwork:  none  Testing/Procedures: Your physician has requested that you have an echocardiogram just before your next visit. Echocardiography is a painless test that uses sound waves to create images of your heart. It provides your doctor with information about the size and shape of your heart and how well your heart's chambers and valves are working. This procedure takes approximately one hour. There are no restrictions for this procedure.  Follow-Up:  Your physician recommends that you schedule a follow-up appointment in: 3 months  Any Other Special Instructions Will Be Listed Below (If Applicable).  If you need a refill on your cardiac medications before your next appointment, please call your pharmacy.

## 2021-05-03 LAB — CUP PACEART REMOTE DEVICE CHECK
Battery Remaining Longevity: 106 mo
Battery Remaining Percentage: 95.5 %
Battery Voltage: 2.99 V
Brady Statistic AP VP Percent: 1 %
Brady Statistic AP VS Percent: 65 %
Brady Statistic AS VP Percent: 1 %
Brady Statistic AS VS Percent: 34 %
Brady Statistic RA Percent Paced: 28 %
Brady Statistic RV Percent Paced: 7.7 %
Date Time Interrogation Session: 20220604173136
Implantable Lead Implant Date: 20001120
Implantable Lead Implant Date: 20001120
Implantable Lead Location: 753859
Implantable Lead Location: 753860
Implantable Pulse Generator Implant Date: 20171220
Lead Channel Impedance Value: 240 Ohm
Lead Channel Impedance Value: 390 Ohm
Lead Channel Pacing Threshold Amplitude: 0.625 V
Lead Channel Pacing Threshold Amplitude: 1 V
Lead Channel Pacing Threshold Pulse Width: 0.5 ms
Lead Channel Pacing Threshold Pulse Width: 0.5 ms
Lead Channel Sensing Intrinsic Amplitude: 5 mV
Lead Channel Sensing Intrinsic Amplitude: 7.8 mV
Lead Channel Setting Pacing Amplitude: 1.625
Lead Channel Setting Pacing Amplitude: 2.5 V
Lead Channel Setting Pacing Pulse Width: 0.5 ms
Lead Channel Setting Sensing Sensitivity: 2 mV
Pulse Gen Model: 2272
Pulse Gen Serial Number: 7983565

## 2021-05-04 ENCOUNTER — Ambulatory Visit (INDEPENDENT_AMBULATORY_CARE_PROVIDER_SITE_OTHER): Payer: Medicare Other

## 2021-05-04 DIAGNOSIS — I495 Sick sinus syndrome: Secondary | ICD-10-CM

## 2021-05-26 NOTE — Progress Notes (Signed)
Remote pacemaker transmission.   

## 2021-05-28 ENCOUNTER — Other Ambulatory Visit: Payer: Medicare Other

## 2021-06-02 ENCOUNTER — Ambulatory Visit: Payer: Medicare Other | Admitting: Family Medicine

## 2021-06-02 ENCOUNTER — Other Ambulatory Visit: Payer: Self-pay

## 2021-06-02 MED ORDER — CLOPIDOGREL BISULFATE 75 MG PO TABS: 75.0000 mg | ORAL_TABLET | Freq: Every day | ORAL | 2 refills | Status: DC

## 2021-06-02 NOTE — Telephone Encounter (Signed)
Refilled plavix 75 mg qd #90 to [facility] per fax request.

## 2021-06-02 NOTE — Telephone Encounter (Signed)
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

## 2021-06-17 ENCOUNTER — Other Ambulatory Visit: Payer: Medicare Other

## 2021-06-18 ENCOUNTER — Ambulatory Visit (INDEPENDENT_AMBULATORY_CARE_PROVIDER_SITE_OTHER): Payer: Medicare Other

## 2021-06-18 DIAGNOSIS — I5022 Chronic systolic (congestive) heart failure: Secondary | ICD-10-CM | POA: Diagnosis not present

## 2021-06-18 LAB — ECHOCARDIOGRAM COMPLETE
AR max vel: 2.34 cm2
AV Area VTI: 2.68 cm2
AV Area mean vel: 2.64 cm2
AV Mean grad: 4.8 mmHg
AV Peak grad: 10.3 mmHg
Ao pk vel: 1.6 m/s
Area-P 1/2: 2.05 cm2
Calc EF: 36.1 %
S' Lateral: 4.63 cm
Single Plane A2C EF: 23.8 %
Single Plane A4C EF: 40.8 %

## 2021-07-18 NOTE — Progress Notes (Signed)
Cardiology Office Note  Date: 07/20/2021   ID: Dmitry, Rutter 05/30/1941, MRN [id number]  PCP:  Medicine, [facility]  Cardiologist:  [name], MD Electrophysiologist:  [name], MD   Chief Complaint: Discuss BP and medications  History of Present Illness: [name] is a 80 y.o. 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  Recent admission to [facility] 02/09/2021 with SOB / CP. Found to be in atrial fibrillation with RVR with elevated troponin c/w NSTEM. LHC was planned but patient left AMA and refused procedure.   Presented to [facility] 02/13/2021 with c/o of 2 days of CP. Hospital note stated pain had recurred after leaving AMA on 02/10/2021. He was transported emergently to [facility] for cardiac catheterization. PCI initially to RCA with DES x 1. Staged intervention with successful PCI with DES to mid LAD, and prox/mid circumflex. Initial plan to start triple therapy with ASA 81 mg x 2 weeks, Plavix 75 mg daily and Eliquis 5 mg po bid. Remained in atrial fibrillation with ChadsVasc of 3 . Rate controlled on BB, Eliquis 5 mg po bid. HF with EF 20-25% with suspected etiology mixed due to CAD and tachycardia mediated. Started Coreg 3.125 mg po bid and Entresto 24/26 mg po bid. Advised consideration of SGLT2 inhibitor and addition of spironolactone as outpatient.. Recheck Echo in 3 months. If no improvement, consider CRT as patient has known LBBB. To follow with Dr Graciela Husbands as outpatient. Atorvastatin 80 mg started. Check FLP in 8 weeks  He was last here for hospital follow-up status post recent STEMI with initial PCI to RCA and subsequent PCI to mid LAD and proximal to mid circumflex with DES .  He states today he feels short of breath.  States has been taking his medications as directed.  Blood pressure is on the  soft side at 94/62.  He denies any orthostatic symptoms.  Weight today is 225.  Has some minimal lower extremity edema.  He denies any anginal symptoms.  Denies any near syncopal or syncopal episodes.  States occasional dizziness.  Denies any CVA or TIA-like symptoms.   Recently presented to [facility] 03/11/2021 emergency department with altered mental status.  He was found unresponsive by EMS.  He was in respiratory distress.  He had wide-complex tachycardia on the monitor concerning for ventricular tachycardia.  EKG showed atrial fibrillation with RVR / V. tach.  He was started on amiodarone.  Chest x-ray revealed interstitial edema.  Lasix was started.  His blood pressure was low and he was started on Levophed.  He had a fever of 104.  He was eventually transferred to [facility] after being intubated for acute hypoxic respiratory failure.  He was diagnosed with septicemia and influenza.  His proBNP was 23,736.  Creatinine was 2.02, GFR was 31.  At [facility] 03/12/2021 diagnosis of acute respiratory failure with hypoxia, CHF class III, atrial fibrillation and atrial flutter, influenza A.  His echocardiogram on 03/12/2021 showed EF of 15 to 20% he was evaluated by cardiology and started on IV milrinone, IV Lasix, captopril for several days.  He was weaned off medications and started on oral Cozaar, Lasix, digoxin and amiodarone.  He was receiving anticoagulation with Eliquis.  He was discharged on amiodarone 200 mg daily, Digoxin 0.125 daily, Cozaar 12.5 mg p.o. daily.  He was continuing Lipitor 80 mg daily, Plavix  75 mg daily, Eliquis 5 mg p.o. twice daily, Lasix 40 mg daily, sublingual nitroglycerin 0.4 Protonix 40 mg daily.  He is here for issues with his medications and blood pressure per appointment scheduled note.  Blood pressure appears to be well controlled today at 118/78.  Nursing staff state he has run out of his atorvastatin, Plavix, Lasix, losartan, Protonix.  He denies any current  anginal symptoms, DOE or SOB.  He has lost about 5 pounds since June.  Today's weight is 173.  Heart rate 64 and irregularly irregular.  He denies any bleeding in stool or urine.  Denies any PND or orthopnea.  Denies any CVA or TIA-like symptoms.  No claudication-like symptoms, DVT or PE-like symptoms.  No lower extremity edema.  He states he has a rash in the inguinal perineal area between his legs.  He states he only takes showers twice a week.  He states he has some cream he uses in the area where the irritation is in the perineal area he uses.  Advised him to speak with his primary care provider regarding this problem.  He also states his stomach feels empty all the time.  I advised him to speak with primary care provider.  Otherwise he has no complaints.   Past Medical History:  Diagnosis Date   Anginal pain (HCC)    Asthma    Bronchitis    Cardiomyopathy (HCC)    Possibly tachycardia mediated   History of epididymitis    History of recurrent UTIs    History of ruptured appendix    Paroxysmal atrial fibrillation (HCC)    Peptic ulcer disease    Pneumonia due to COVID-19 virus    PONV (postoperative nausea and vomiting)    Presence of permanent cardiac pacemaker    Tachy-brady syndrome (HCC)    PPM (SJM) implanted by Dr Graciela HusbandsKlein 10/19/99, gen change 04/03/08    Past Surgical History:  Procedure Laterality Date   APPENDECTOMY     CARDIOVERSION [name] 02/05/2021   Procedure: CARDIOVERSION;  Surgeon: Jonelle SidleMcDowell, Samuel G, MD;  Location: [facility];  Service: Cardiovascular;  Laterality: [name];   CORONARY STENT INTERVENTION [name] 02/16/2021   Procedure: CORONARY STENT INTERVENTION;  Surgeon: Marykay LexHarding, David W, MD;  [facility];  Service: Cardiovascular;  Laterality: [name];   CORONARY/GRAFT ACUTE MI REVASCULARIZATION [name] 02/13/2021   Procedure: Coronary/Graft Acute MI Revascularization;  Surgeon: Lennette BihariKelly, Thomas A, MD;  [facility];  Service: Cardiovascular;  Laterality: [name];    EP IMPLANTABLE DEVICE [name] 11/17/2016   Procedure: PPM Generator Changeout;  Surgeon: Marinus MawGregg W Taylor, MD;  Location: [facility] INVASIVE CV LAB;  Service: Cardiovascular;  Laterality: [name];   INSERT / REPLACE / REMOVE PACEMAKER     LEFT HEART CATH [name] 02/16/2021   Procedure: Left Heart Cath;  Surgeon: Marykay LexHarding, David W, MD;  [facility];  Service: Cardiovascular;  Laterality: [name];   LEFT HEART CATH AND CORONARY ANGIOGRAPHY [name] 02/13/2021   Procedure: LEFT HEART CATH AND CORONARY ANGIOGRAPHY;  Surgeon: Lennette BihariKelly, Thomas A, MD;  Location: [facility] INVASIVE CV LAB;  Service: Cardiovascular;  Laterality: [name];   PACEMAKER PLACEMENT  03/2008   [employer], [place]. [id number]    Current Outpatient Medications  Medication Sig Dispense Refill   amiodarone (PACERONE) 200 MG tablet Take 200 mg by mouth daily.     digoxin (LANOXIN) 0.125 MG tablet Take 0.125 mg by mouth daily.     ELIQUIS 5 MG TABS tablet Take 1 tablet (  5 mg total) by mouth 2 (two) times daily. 60 tablet 2   Menthol-Zinc Oxide (CALMOSEPTINE) 0.44-20.6 % OINT Apply topically. As needed to groin area.     nitroGLYCERIN (NITROSTAT) 0.4 MG SL tablet Place 1 tablet (0.4 mg total) under the tongue every 5 (five) minutes as needed. (Patient taking differently: Place 0.4 mg under the tongue every 5 (five) minutes x 3 doses as needed for chest pain (if no relief after 2nd dose, proceed to the ED for an evaluation or call 911).) 25 tablet 2   atorvastatin (LIPITOR) 80 MG tablet Take 1 tablet (80 mg total) by mouth daily. (Patient not taking: Reported on 07/20/2021) 90 tablet 0   clopidogrel (PLAVIX) 75 MG tablet Take 1 tablet (75 mg total) by mouth daily with breakfast. (Patient not taking: Reported on 07/20/2021) 90 tablet 2   furosemide (LASIX) 40 MG tablet Take 1 tablet (40 mg total) by mouth daily. (Patient not taking: Reported on 07/20/2021) 30 tablet 2   losartan (COZAAR) 25 MG tablet Take 12.5 mg by mouth daily. (Patient not taking: Reported on 07/20/2021)      pantoprazole (PROTONIX) 40 MG tablet TAKE 1 TABLET (40 MG TOTAL) BY MOUTH DAILY AT 6 (SIX) AM. (Patient not taking: Reported on 07/20/2021) 30 tablet 1   No current facility-administered medications for this visit.   Allergies:  Bee venom, Corn-containing products, Citrullus vulgaris, Codeine, Invert sugar, Rice, and Vanilla   Social History: The patient  reports that he quit smoking about 43 years ago. His smoking use included cigarettes. He started smoking about 61 years ago. He has a 30.00 pack-year smoking history. He has never used smokeless tobacco. He reports that he does not drink alcohol and does not use drugs.   Family History: The patient's family history includes Cancer in his father; Suicidality in his mother.   ROS:  Please see the history of present illness. Otherwise, complete review of systems is positive for none.  All other systems are reviewed and negative.   Physical Exam: VS:  BP 118/78   Pulse 64   Ht 5\' 10"  (1.778 m)   Wt 173 lb 3.2 oz (78.6 kg)   SpO2 99%   BMI 24.85 kg/m , BMI Body mass index is 24.85 kg/m.  Wt Readings from Last 3 Encounters:  07/20/21 173 lb 3.2 oz (78.6 kg)  05/01/21 178 lb (80.7 kg)  02/26/21 225 lb 3.2 oz (102.2 kg)    General: Patient appears comfortable at rest. Neck: Supple, no elevated JVP or carotid bruits, no thyromegaly. Lungs: Clear to auscultation, nonlabored breathing at rest. Cardiac: Irregularly irregular rate and rhythm, no S3 or significant systolic murmur, no pericardial rub. Extremities: No pitting edema, distal pulses 2+. Skin: Warm and dry. Musculoskeletal: No kyphosis. Neuropsychiatric: Alert and oriented x3, affect grossly appropriate.  ECG:  EKG 02/16/2021 atrial fibrillation with occasional ventricular paced complexes, left axis deviation, left bundle branch block, rate of 87.  Recent Labwork: 01/08/2021: TSH 2.395 02/13/2021: ALT 30; AST 29 02/17/2021: BUN 16; Creatinine, Ser 1.14; Hemoglobin 14.5; Platelets  186; Potassium 4.6; Sodium 136     Component Value Date/Time   CHOL 112 02/13/2021 0425   TRIG 48 02/13/2021 0425   HDL 29 (L) 02/13/2021 0425   CHOLHDL 3.9 02/13/2021 0425   VLDL 10 02/13/2021 0425   LDLCALC 73 02/13/2021 0425   LDLDIRECT 147.9 08/30/2008 0842    Other Studies Reviewed Today:   Echocardiogram 06/18/2021  1. Left ventricular ejection fraction, by estimation, is  30 to 35%. The left ventricle has moderately decreased function. The left ventricle demonstrates global hypokinesis. Left ventricular diastolic parameters are consistent with Grade I diastolic dysfunction (impaired relaxation). Elevated left atrial pressure. The average left ventricular global longitudinal strain is -8.9 %. The global longitudinal strain is abnormal. 2. RV not well visualized, grossly appears normal in size and function. . Right ventricular systolic function was not well visualized. The right ventricular size is not well visualized. There is mildly elevated pulmonary artery systolic pressure. 3. The mitral valve is normal in structure. Mild mitral valve regurgitation. No evidence of mitral stenosis. 4. The aortic valve was not well visualized. There is mild calcification of the aortic valve. There is mild thickening of the aortic valve. Aortic valve regurgitation is not visualized. No aortic stenosis is present. 5. Aortic atherosclerosis is noted. 6. The inferior vena cava is normal in size with greater than 50% respiratory variability, suggesting right atrial pressure of 3 mmHg. Comparison(s): Prior Echo showed LV EF 25%, global hypokinesis, severely reduced RV function. Noncontrast images suggest fixed calcified apical structure, which is not seen on contrast images.           Echocardiogram 03/12/2021 [facility]  Narrative The left ventricular systolic function was severely abnormal with an EF of 15-20%. There was no significant valvular pathology.   Left Ventricle  Left  ventricle size is normal. Wall thickness is normal. EF: 15-20%. Wall motion is normal. Unable to assess diastolic function due to underlying atrial flutter.   Right Ventricle  Right ventricle is mildly dilated. Systolic function is moderately reduced. A pacing/ICD lead is noted in the right ventricle.   Left Atrium  Left atrium is normal in size.   Right Atrium  Normal sized right atrium.   Mitral Valve  Mitral valve structure is normal. There is no mitral regurgitation.   Tricuspid Valve  Tricuspid valve structure is normal. There is mild regurgitation. The right ventricular systolic pressure is normal (<36 mmHg).   Aortic Valve  The aortic valve is tricuspid. The leaflets are not thickened and exhibit normal excursion. The leaflets are mildly calcified. There is no regurgitation or stenosis.   Pulmonic Valve  The pulmonic valve was not well visualized. Trace regurgitation.   Ascending Aorta  The aortic root is normal in size.   Pericardium  There is no pericardial effusion. There is a left pleural effusion.   Study Details  A complete echo was performed using complete 2D, color flow Doppler and spectral Doppler. During the study the apical, parasternal and subcostal view was captured. Definity contrast was injected during the study. Overall the study quality was adequate. The study was technically difficult. The study was difficult due to patient's clinical status and body habitus.   Wall Scoring Baseline  Score Index: 2.65  The following segments are dyskinetic: basal anteroseptal, basal inferoseptal, mid anteroseptal, mid inferoseptal and apical septal.  The following segments are akinetic: mid anterolateral.  The following segments are hypokinetic: basal anterior, basal inferior, basal inferolateral, basal anterolateral, mid anterior, mid inferior, mid inferolateral, apical anterior, apical inferior, apical lateral and apex.   Cath: 02/13/21   Prox Cx lesion is 80%  stenosed. Prox Cx to Mid Cx lesion is 90% stenosed. Prox LAD to Mid LAD lesion is 40% stenosed. Mid LAD-1 lesion is 95% stenosed. Mid LAD-2 lesion is 20% stenosed. Prox RCA lesion is 20% stenosed. Prox RCA to Mid RCA lesion is 100% stenosed. Post intervention, there is a 0% residual stenosis. A  stent was successfully placed.   ST segment elevation MI with total occlusion of the large mid RCA with thrombus and initial TIMI 0 flow.   Severe concomitant multivessel disease with 40% proximal LAD stenosis followed by 90 to 95% stenosis; and 80 and 90% proximal circumflex stenoses.   LVEDP 21 mmHg   Successful PCI to the totally occluded RCA with ultimate insertion of a 3.5x18 mm Resolute Onyx stent postdilated to 3.55 mm with resumption of brisk TIMI-3 flow resulting in a large dominant RCA with the 100% occlusion being reduced to 0%.  There is mild 30% stenosis in the distal RCA before the PDA takeoff with 30% proximal PDA stenoses.   Recommendation: At present we will initiate aspirin/Plavix and hold Eliquis.  The patient will need staged intervention to his LAD and circumflex vessels.  He has reported EF in the 20 to 25% range.  Hopefully LV function may improve following revascularization.  The patient's warfarin dose had been discontinued at [facility] earlier in the week.  The patient was uncertain when his last dose of Eliquis was but he did not take it this evening.  Continue optimal therapy for HFrEF, and aggressive lipid-lowering therapy with target LDL less than 70.   Diagnostic Dominance: Right      Intervention        Cath: 02/16/21   LV end diastolic pressure is mildly elevated. ------------------------------- Lesion Segment #1: Prox LAD to Mid LAD lesion is 40% stenosed. Mid LAD-1 lesion is 95% stenosed. A drug-eluting stent was successfully placed covering both lesions, using a STENT RESOLUTE ONYX 3.0X30 -> postdilated to 3.3 mm Post intervention, there is a 0% residual  stenosis throughout. --------------------------- Suezanne Jacquet Cx lesion is 50% stenosed. -> The intention was to avoid during this lesion. Lesion Segment #2: Prox Cx lesion is 80% stenosed. Prox Cx to Mid Cx lesion is 90% stenosed. A drug-eluting stent was successfully placed covering both lesions, using a STENT RESOLUTE ONYX 2.5X30. Postdilated 2.65 mm. Post intervention, there is a 0% residual stenosis throughout..   SUMMARY Successful two-vessel PCI: Mid LAD: Resolute Onyx DES 3.0 mm x 30 mm postdilated to 3.3 mm Proximal to mid LCx: Resolute Onyx DES 2.5 mm x 30 mm postdilated to 2.65 mm LVEDP 18 mm     RECOMMENDATIONS Would strongly consider at least the short-term use of Eliquis as opposed to warfarin to facilitate ease of discharge Initial thoughts of potential discharge today, however with his uncontrolled A. fib rate, I suspect that he may need another day or so of management. We will gently hydrate, may need diuretic at the end of the day.   Bryan Lemma, M.D., M.S.   Diagnostic Dominance: Right      Intervention          Echo: 02/13/21   IMPRESSIONS     1. Left ventricular ejection fraction, by estimation, is 25%. The left  ventricle has severely decreased function. The left ventricle demonstrates  global hypokinesis. There is mild left ventricular hypertrophy. Left  ventricular diastolic parameters are  indeterminate.   2. Right ventricular systolic function is severely reduced. The right  ventricular size is mildly enlarged. There is normal pulmonary artery  systolic pressure.   3. Left atrial size was mildly dilated.   4. Right atrial size was mildly dilated.   5. The mitral valve is grossly normal. Mild mitral valve regurgitation.   6. The aortic valve is grossly normal. Aortic valve regurgitation is not  visualized. No aortic stenosis  is present.   7. The inferior vena cava is dilated in size with <50% respiratory  variability, suggesting right atrial  pressure of 15 mmHg.   Conclusion(s)/Recommendation(s): No left ventricular mural or apical  thrombus/thrombi. Noncontrast images suggest fixed calcified apical  structure, which is not seen on contrast images.    Assessment and Plan:  1. CAD in native artery   2. Persistent atrial fibrillation (HCC)   3. Chronic systolic heart failure (HCC)   4. Tachycardia-bradycardia syndrome (HCC)   5. Hyperlipidemia with target LDL less than 70     1.  S/P ST elevation myocardial infarction involving right coronary artery (HCC) Status post ST elevation MI with stenting of RCA, proximal/mid circumflex, mid LAD.  No current anginal symptoms.  Denies any anginal or exertional symptoms.  Continue Plavix 75 mg daily, nitroglycerin sublingual as needed.  Patient states he ran out of Plavix.  Please refill  2. Persistent atrial fibrillation (HCC)/atrial flutter Heart rate controlled today at 64.  Continue Eliquis 5 mg p.o. twice daily.  Continue amiodarone 200 mg daily.  3. Acute on Chronic systolic heart failure (HCC) Recent follow-up echocardiogram continue furosemide 40 mg daily.  Continue losartan 12.5 mg daily.  Continue digoxin 0.125 mg p.o. daily.  Recent repeat echocardiogram demonstrated improvement in EF to 30 to 35%.  LV with global hypokinesis, G1 DD.  LV global longitudinal strain -8.9%.  Mild mitral regurgitation.  EF improved since last echocardiogram which showed EF of 25%, global hypokinesis,   Will refer to EP for evaluation for possible ICD evaluation.  Patient already has a pacemaker in place for tachybradycardia syndrome.  Patient states he ran out of the Lasix.  Please refill Lasix.  Patient states he ran out of losartan.  Please refill losartan.  4. Tachycardia-bradycardia syndrome (HCC) Sees Dr. Graciela Husbands.  For pacemaker management.  Continue follow-up with Dr. Graciela Husbands. Last device check: Normal device function. 51 new VHR events; longest 1 hour 46 minutes w/ rates 160's bpm; EGMs suggest  AF w/ RVR, AF burden 36%; EGMs suggest A. Fib - ongoing; overall V rates controlled. History of PAF and on carvedilol and warfarin.   5. Hyperlipidemia with target LDL less than 70 Continue atorvastatin 80 mg daily.  Labs on 02/13/2021 showed total cholesterol 112, HDL 29, LDL 73.  I had previously ordered lipid panel 04/13/2021.  Apparently labs were not done.  Patient states that he ran out of atorvastatin.  Medication Adjustments/Labs and Tests Ordered: Current medicines are reviewed at length with the patient today.  Concerns regarding medicines are outlined above.   Disposition: Follow-up with [name] or APP 6 months  Signed, Rennis Harding, NP 07/20/2021 9:27 AM    [facility] at [facility] [address] [place], [place], [place] [address] Phone: [phone]; Fax: [phone]

## 2021-07-20 ENCOUNTER — Ambulatory Visit (INDEPENDENT_AMBULATORY_CARE_PROVIDER_SITE_OTHER): Payer: Medicare Other | Admitting: Family Medicine

## 2021-07-20 ENCOUNTER — Encounter: Payer: Self-pay | Admitting: Family Medicine

## 2021-07-20 VITALS — BP 118/78 | HR 64 | Ht 70.0 in | Wt 173.2 lb

## 2021-07-20 DIAGNOSIS — I2510 Atherosclerotic heart disease of native coronary artery without angina pectoris: Secondary | ICD-10-CM | POA: Diagnosis not present

## 2021-07-20 DIAGNOSIS — I5022 Chronic systolic (congestive) heart failure: Secondary | ICD-10-CM

## 2021-07-20 DIAGNOSIS — E785 Hyperlipidemia, unspecified: Secondary | ICD-10-CM

## 2021-07-20 DIAGNOSIS — I495 Sick sinus syndrome: Secondary | ICD-10-CM

## 2021-07-20 DIAGNOSIS — I4819 Other persistent atrial fibrillation: Secondary | ICD-10-CM

## 2021-07-20 MED ORDER — LOSARTAN POTASSIUM 25 MG PO TABS: 12.5000 mg | ORAL_TABLET | Freq: Every day | ORAL | 3 refills | Status: DC

## 2021-07-20 MED ORDER — CLOPIDOGREL BISULFATE 75 MG PO TABS: 75.0000 mg | ORAL_TABLET | Freq: Every day | ORAL | 3 refills | Status: DC

## 2021-07-20 MED ORDER — ATORVASTATIN CALCIUM 80 MG PO TABS: 80.0000 mg | ORAL_TABLET | Freq: Every day | ORAL | 3 refills | Status: DC

## 2021-07-20 MED ORDER — FUROSEMIDE 40 MG PO TABS: 40.0000 mg | ORAL_TABLET | Freq: Every day | ORAL | 3 refills | Status: DC

## 2021-07-20 MED ORDER — PANTOPRAZOLE SODIUM 40 MG PO TBEC
40.0000 mg | DELAYED_RELEASE_TABLET | Freq: Every day | ORAL | 3 refills | Status: DC
Start: 2021-07-20 — End: 2023-05-31

## 2021-07-20 NOTE — Patient Instructions (Addendum)
Medication Instructions:  Resume your Plavix, Losartan, Lipitor, Lasix, & Protonix. Continue all other medications.    Labwork: none  Testing/Procedures: none  Follow-Up: 6 months   Any Other Special Instructions Will Be Listed Below (If Applicable). Over due for follow up with EP    If you need a refill on your cardiac medications before your next appointment, please call your pharmacy.

## 2021-08-04 ENCOUNTER — Ambulatory Visit (INDEPENDENT_AMBULATORY_CARE_PROVIDER_SITE_OTHER): Payer: Medicare Other

## 2021-08-04 DIAGNOSIS — I495 Sick sinus syndrome: Secondary | ICD-10-CM | POA: Diagnosis not present

## 2021-08-04 LAB — CUP PACEART REMOTE DEVICE CHECK
Battery Remaining Longevity: 59 mo
Battery Remaining Percentage: 57 %
Battery Voltage: 2.99 V
Brady Statistic AP VP Percent: 1 %
Brady Statistic AP VS Percent: 74 %
Brady Statistic AS VP Percent: 1 %
Brady Statistic AS VS Percent: 26 %
Brady Statistic RA Percent Paced: 37 %
Brady Statistic RV Percent Paced: 8.5 %
Date Time Interrogation Session: 20220905020013
Implantable Lead Implant Date: 20001120
Implantable Lead Implant Date: 20001120
Implantable Lead Location: 753859
Implantable Lead Location: 753860
Implantable Pulse Generator Implant Date: 20171220
Lead Channel Impedance Value: 240 Ohm
Lead Channel Impedance Value: 390 Ohm
Lead Channel Pacing Threshold Amplitude: 0.625 V
Lead Channel Pacing Threshold Amplitude: 1 V
Lead Channel Pacing Threshold Pulse Width: 0.5 ms
Lead Channel Pacing Threshold Pulse Width: 0.5 ms
Lead Channel Sensing Intrinsic Amplitude: 10 mV
Lead Channel Sensing Intrinsic Amplitude: 5 mV
Lead Channel Setting Pacing Amplitude: 1.625
Lead Channel Setting Pacing Amplitude: 2.5 V
Lead Channel Setting Pacing Pulse Width: 0.5 ms
Lead Channel Setting Sensing Sensitivity: 2 mV
Pulse Gen Model: 2272
Pulse Gen Serial Number: 7983565

## 2021-08-12 NOTE — Progress Notes (Signed)
Remote pacemaker transmission.   

## 2021-08-18 ENCOUNTER — Ambulatory Visit: Payer: Medicare Other | Admitting: Family Medicine

## 2021-08-31 NOTE — Progress Notes (Addendum)
Cardiology Office Note Date:  08/31/2021  Patient ID:  [name], [name] December 17, 1940, MRN [id number] PCP:  Medicine, [facility]  Cardiologist:  [name] EP: Dr. Graciela Husbands     Chief Complaint:  unclear, perhaps to discuss device upgrade    History of Present Illness: [name] is a 80 y.o. 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  He converted to SR/Av paced rhythm and transitioned to his home oral dose of amiodarone.  He saw Dr. Graciela Husbands 03/31/20, (he recalled the last office visit the pt was infested with bedbugs).  He noted significant peripheral edema and SOB.  His lopressor was changed to toprol, and lasix double for 5 days.  F/u with cardiology service, Verita Schneiders, NP on 04/15/2020.  He was in Afib w/RVR, rate 126.  He had 3 BB pill bottle, tartrate, succinate and coreg, was taking the succinate and coreg, he was not taking the lasix.  He was hypotensive though not symptomatic with this, recommended he be brought via [facility] though he refused and voiced frustration with the [facility] system and insisted on driving himself to [facility].   Review of care everywhere 05/23/2020 there is a note of "termination of physician/patient relationship by the patient" with [facility] 05/19/20: "No follow up planned for returning to PCP office. Advised pt to find another provider for his care due to confrontational and rude behavior towards provider" At this visit is noted the patient voiced he did not intend on following up with pulmonology despite recommendation    Admitted 04/27/20 > discharged 6/5/2021Hospitalization [facility]  Discharge Follow-up  Action Items: 1. Follow-up with primary care doctor regarding ongoing need for oxygen and hospital FU 2. Follow-up with pulmonology in 1 month regarding amiodarone toxicity, ct scan, and resolution of pulmonary abnormalities. 3. Follow-up with cardiology in 2-4 weeks regarding amiodarone management and possible transition to other antiarrhythmics if amiodarone toxicity is still in the differential. Additionally he will need an ECHO outpatient to monitor for EF recovery. If no recovery, he will need cath.  Hospital Course:  [name] is 80 year old male with history of AFIB (on warfarin; PPM), HFrEF and BPH who presented to OSH with complaint of SOB. The patient reported symptoms had been present for 2 days. Constant and progressively worsening. No modifying factors. There is associated cough. No fever. No lower extremity swelling. No CP or SOB. No orthopnea or PND. In the ED the patient was afebrile and hypoxemic with increased WOB. CBC revealed WBC normal. CMP normal electrolytes and renal function. Troponin 700 and decreased to 600 on repeat. CXR consistent with interstitial edema and volume overload. EKG revealed AFIB with RVR. The patient was placed on BiPAP and transferred for additional management.   Patient received in CCU in stable condition. BiPAP quickly transitioned to optiflow. TTE obtained and showed EF: 20-25% without effusion or wall motion abnormalities. The patient was diuresed with 60 IV lasix BID. He had appropriate urine outpatient and rapidly reached euvolemia. It was determined on chart review that EF was not newly reduced and the patient would benefit from consideration of ischemic evaluation outpatient. The patient did require transient IV amiodarone infusion  for AFIB with RVR. Rate control was achieved and he was restarted on home PO amiodarone (dose increased to 200). Warfarin was continued for anticoagulation throughout hospitalization.   Of note, the patient has history of  recent admission to OSH for COVID PNA. He continued to require oxygen after euvolemia achieved. We therefore obtained CT chest that revealed findings consistent with UIP. Pulmonology was consulted and after discussion with their team, we elected to obtain follow up CT chest in 3 weeks. His amio may need to be discontinued if his pulm abnormalities are thought to be related to amio toxicity on FU CT.   Prior to discharge, his carvedilol and entresto were uptitrated. He was educated regarding his need for lasix PRN if his weight were to increase. He will need an outpatient ECHO to determine the EF for possible cath and revascularization in 2-4 weeks. His EF reduction was thought to be secondary to resp failure however this may require further workup.     04/28/2020: TTE SUMMARY  The left ventricular size is normal.  Left ventricular systolic function is severely reduced.  LV ejection fraction = 20-25%.  Left ventricular filling pattern is prolonged relaxation.  Abnormal (paradoxical) septal motion consistent with LBBB.  Unable to fully assess LV regional wall motion  The right ventricle is normal size.  The right ventricular systolic function is mildly reduced.  There is mild mitral regurgitation.  No significant stenosis seen  There was insufficient TR detected to calculate RV systolic pressure.  Estimated right atrial pressure is 5 mmHg.Marland Kitchen  There is no pericardial effusion.  There is no comparison study available.      CT CHEST WO CONTRAST 04/29/2020 Final Result  1. Bilateral diffuse subpleural reticulations, interlobular septal thickening with traction bronchiectasis, upper lobe predominance. Heterogenous groundglass opacities with upper lobe predominant air trapping. No definite bronchiectasis. 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 Differential considerations include  chronic hypersensitivity pneumonitis. Consider pulmonology consult.  2. Cardiomegaly with superimposed mild pulmonary interstitial edema.  3. Additional patchy airspace disease involving upper lobes, may favor acute exacerbation of interstitial lung disease versus multifocal infection.  Imaging: (images independently reviewed) CT chest w/o contrast (04/29/2020):  Bilateral diffuse subpleural reticulations, interlobular septal thickening with traction bronchiectasis, upper lobe predominance. Heterogenous groundglass opacities with upper lobe predominant air trapping. No definite bronchiectasis.    Patient had bronchoscopy with BAL on 05/02/2020 with BAL differential showing 50% lymphocytes and 40% macrophages with only 1% eosinophils. BAL resp Thomas and PJP negative. AFB and fungal Thomas pending. Amiodarone lung toxicity is a diagnosis of exclusion. Bronch does help to rule out infectious process and acute eosinophilic PNA. However, bronch does not prove diagnosis of amiodarone lung toxicity. In this gentleman with recent COVID, certainly changes in his CT chest could be 2/2 fibrosis from COVID vs. Amiodarone lung toxicity.     Our device clinic noted 6/7 increase in Afib with an episode of >1 day with RVR at times.  The pt reported he had just been discharged from [facility] 6/6 and his medicines had been adjusted he did not want to review the changes with the nurse, she advised Dr. Graciela Husbands follow up.  He did confirm compliance with his warfarin.   I saw him 06/04/20 He comes in today bring his O2 but tank was off.   He mentions DOE since the 1st hospital stay but denies rest SOB and denies symptoms of PND  or orthopnea.  No near syncope or syncope.  When asked about CP, he mentions an occasional fleeting sharp pain far left low thorax, none otherwise.  This with no pattern or clear trigger.  No palpitations or cardiac awareness His biggest concern is his back pain, this he mentions is why he fired his PMD, stated that  she told him she would prescribe him pain medicine but did not He does not plan on following up with pulmonology, this is to inconvenient in location and the burden of another doctor's visit among all the others.  Is just too much. He is not taking the lasix every day because he says his PMD prescribed it for him and he does not want to take anything she gave him.  But has used it at least once in the last couple weeks for some ankle swelling that resolved with a single dose. He also mentions that it makes him urinate too much and would not take it every day anyway. O2 sat on RA after walking in from the waiting room was 93%, increased to 97% on 2L n/c He reports at home he will take it off for several hours, sometimes his O2 sat will get into the 80's and sometimes on RA with ambulation to high 70's He was very unhappy being on my schedule today rather then seeing Dr. Graciela Husbands Despite discussions on importance of O2 and avoiding desaturations, and lengthy dicussion with dr. Graciela Husbands as well for recommended medication changes, the patient was feeling well and not agreeable to make any changes.  March 2022 Admitted with STEMI >> PCI to RCA, though noted with mutivessel disease and recommended to have staged intervention of his LAD and was done during that stay as well. No attempts to DCCV, planned at least at this stage for rate control, on ASA, plavix and Eliquis LVEF 25% with hopes of improvement post revascularization   03/11/2021 emergency department with altered mental status.  He was found unresponsive by EMS.  He was in respiratory distress.  He had wide-complex tachycardia on the monitor concerning for ventricular tachycardia.  EKG showed atrial fibrillation with RVR / V. tach.  He was started on amiodarone.  Chest x-ray revealed interstitial edema.  Lasix was started.  His blood pressure was low and he was started on Levophed.  He had a fever of 104.  He was eventually transferred to [facility]  after being intubated for acute hypoxic respiratory failure.  He was diagnosed with septicemia and influenza His echocardiogram on 03/12/2021 showed EF of 15 to 20% he was evaluated by cardiology and started on IV milrinone, IV Lasix, captopril for several days.  He was weaned off medications and started on oral Cozaar, Lasix, digoxin and amiodarone.  He was receiving anticoagulation with Eliquis.  He was discharged on amiodarone 200 mg daily, Digoxin 0.125 daily, Cozaar 12.5 mg p.o. daily.  He was continuing Lipitor 80 mg daily, Plavix 75 mg daily, Eliquis 5 mg p.o. twice daily, Lasix 40 mg daily, sublingual nitroglycerin 0.4 Protonix 40 mg daily.  He has had f/u with cards team, Verita Schneiders, NP last 07/20/21, had run out of a number of medicines including his plavix, BP looked OK, pt concerns were of b/l inguinal rash   TODAY All in all, doing fair to OK. Mostly he is worried about unintentional weight loss and early satiety. Says he feels hungry all of the time, but seems to get full feeling quickly like his food is stopping at the  top of his stomach and ultimately probably not eating a whole lot.  No belly pain, no vomiting.  Cardiac-wise. He reports he sleeps in his recliner, has for quite a long time and when he first gets up in the am has an achin to his chest that resolves after he gets up and out of the chair, straightens up/stretches. No CP otherwise Not physically active, "just lazy", no CP or SOB with his ADLs, shopping etc. No dizzy spells, near syncope or syncope. Denies SOB with his level of exertion, has been off O2 for "a long time"  No bleeding or signs of bleeding In review of his medicines, looks like as far as I can tell, he has been off coreg since his April hospital stay, but he has an AVS from [facility] ER visit 08/07/21 (where he went for a rash) and coreg, entresto, ASA, lipitor, furosemide, and Eliquis  are listed as "ask your doctor how to take"    He does not know  his medicines  by name or the number, says he is taking what he was told to.  He requests that his appts all be in [place], that getting to [facility] is very difficult   Device information SJM dual chamber PPM implanted  2000, gen change 11/17/2016   Past Medical History:  Diagnosis Date   Anginal pain (HCC)    Asthma    Bronchitis    Cardiomyopathy (HCC)    Possibly tachycardia mediated   History of epididymitis    History of recurrent UTIs    History of ruptured appendix    Paroxysmal atrial fibrillation (HCC)    Peptic ulcer disease    Pneumonia due to COVID-19 virus    PONV (postoperative nausea and vomiting)    Presence of permanent cardiac pacemaker    Tachy-brady syndrome (HCC)    PPM (SJM) implanted by Dr Graciela Husbands 10/19/99, gen change 04/03/08    Past Surgical History:  Procedure Laterality Date   APPENDECTOMY     CARDIOVERSION [name] 02/05/2021   Procedure: CARDIOVERSION;  Surgeon: Jonelle Sidle, MD;  Location: [facility];  Service: Cardiovascular;  Laterality: [name];   CORONARY STENT INTERVENTION [name] 02/16/2021   Procedure: CORONARY STENT INTERVENTION;  Surgeon: Marykay Lex, MD;  Location: [facility] INVASIVE CV LAB;  Service: Cardiovascular;  Laterality: [name];   CORONARY/GRAFT ACUTE MI REVASCULARIZATION [name] 02/13/2021   Procedure: Coronary/Graft Acute MI Revascularization;  Surgeon: Lennette Bihari, MD;  Location: [facility] INVASIVE CV LAB;  Service: Cardiovascular;  Laterality: [name];   EP IMPLANTABLE DEVICE [name] 11/17/2016   Procedure: PPM Generator Changeout;  Surgeon: Marinus Maw, MD;  Location: [facility] INVASIVE CV LAB;  Service: Cardiovascular;  Laterality: [name];   INSERT / REPLACE / REMOVE PACEMAKER     LEFT HEART CATH [name] 02/16/2021   Procedure: Left Heart Cath;  Surgeon: Marykay Lex, MD;  Location: [facility] INVASIVE CV LAB;  Service: Cardiovascular;  Laterality: [name];   LEFT HEART CATH AND CORONARY ANGIOGRAPHY [name] 02/13/2021   Procedure: LEFT HEART CATH AND CORONARY ANGIOGRAPHY;  Surgeon: Lennette Bihari, MD;  Location: [facility] INVASIVE CV LAB;  Service: Cardiovascular;  Laterality: [name];   PACEMAKER PLACEMENT  03/2008   [employer], [place]. [id number]    Current Outpatient Medications  Medication Sig Dispense Refill   amiodarone (PACERONE) 200 MG tablet Take 200 mg by mouth daily.     atorvastatin (LIPITOR) 80 MG tablet Take 1 tablet (80 mg total) by mouth daily. 90 tablet 3  clopidogrel (PLAVIX) 75 MG tablet Take 1 tablet (75 mg total) by mouth daily with breakfast. 90 tablet 3   digoxin (LANOXIN) 0.125 MG tablet Take 0.125 mg by mouth daily.     ELIQUIS 5 MG TABS tablet Take 1 tablet (5 mg total) by mouth 2 (two) times daily. 60 tablet 2   furosemide (LASIX) 40 MG tablet Take 1 tablet (40 mg total) by mouth daily. 90 tablet 3   losartan (COZAAR) 25 MG tablet Take 0.5 tablets (12.5 mg total) by mouth daily. 45 tablet 3   Menthol-Zinc Oxide (CALMOSEPTINE) 0.44-20.6 % OINT Apply topically. As needed to groin area.     nitroGLYCERIN (NITROSTAT) 0.4 MG SL tablet Place 1 tablet (0.4 mg total) under the tongue every 5 (five) minutes as needed. (Patient taking differently: Place 0.4 mg under the tongue every 5 (five) minutes x 3 doses as needed for chest pain (if no relief after 2nd dose, proceed to the ED for an evaluation or call 911).) 25 tablet 2   pantoprazole (PROTONIX) 40 MG tablet Take 1 tablet (40 mg total) by mouth daily. 90 tablet 3   No current facility-administered medications for this visit.    Allergies:   Bee venom, Corn-containing products, Citrullus vulgaris, Codeine, Invert sugar, Rice, and Vanilla   Social History:  The patient  reports that he quit smoking about 43 years ago. His smoking use included cigarettes. He started smoking about 62 years ago. He has a 30.00 pack-year smoking history. He has never used smokeless tobacco. He reports that he does not drink alcohol and does not use drugs.   Family History:  The patient's family history includes Cancer in his father; Suicidality in  his mother.  ROS:  Please see the history of present illness.  All other systems are reviewed and otherwise negative.   PHYSICAL EXAM:  VS:  There were no vitals taken for this visit. BMI: There is no height or weight on file to calculate BMI. Well nourished, well developed, in no acute distress  HEENT: normocephalic, atraumatic  Neck: no JVD, carotid bruits or masses Cardiac: RRR ; no significant murmurs, no rubs, or gallops Lungs:  CTA b/l, no wheezing, rhonchi or rales  Abd: soft, nontender MS: no deformity or atrophy Ext: NO edema today Skin: warm and dry, no rash Neuro:  No gross deficits appreciated Psych: euthymic mood, unusual affect  PPM site is stable, no tethering or discomfort   EKG:  Not done today  PPM interrogation done today and reviewed by myself: Battery and lead measurements are good AF burden is reported at 48%AMS, 19AF burden, though this is since May 2021, and in review of his diagnostics his AF burden is actually [facility] IMPROVED On RV lead noise reversion was very fleeting back in march 2022, not again and unable to reproduce today HVR are Afib   Echocardiogram 06/18/2021  1. Left ventricular ejection fraction, by estimation, is 30 to 35%. The left ventricle has moderately decreased function. The left ventricle demonstrates global hypokinesis. Left ventricular diastolic parameters are consistent with Grade I diastolic dysfunction (impaired relaxation). Elevated left atrial pressure. The average left ventricular global longitudinal strain is -8.9 %. The global longitudinal strain is abnormal. 2. RV not well visualized, grossly appears normal in size and function. . Right ventricular systolic function was not well visualized. The right ventricular size is not well visualized. There is mildly elevated pulmonary artery systolic pressure. 3. The mitral valve is normal in structure. Mild mitral valve regurgitation.  No evidence of mitral stenosis. 4. The aortic  valve was not well visualized. There is mild calcification of the aortic valve. There is mild thickening of the aortic valve. Aortic valve regurgitation is not visualized. No aortic stenosis is present. 5. Aortic atherosclerosis is noted. 6. The inferior vena cava is normal in size with greater than 50% respiratory variability, suggesting right atrial pressure of 3 mmHg. Comparison(s): Prior Echo showed LV EF 25%, global hypokinesis, severely reduced RV function. Noncontrast images suggest fixed calcified apical structure, which is not seen on contrast images.  Echocardiogram 03/12/2021 [facility] Narrative The left ventricular systolic function was severely abnormal with an EF of 15-20%. There was no significant valvular pathology.   Left Ventricle  Left ventricle size is normal. Wall thickness is normal. EF: 15-20%. Wall motion is normal. Unable to assess diastolic function due to underlying atrial flutter.   Right Ventricle  Right ventricle is mildly dilated. Systolic function is moderately reduced. A pacing/ICD lead is noted in the right ventricle.   Left Atrium  Left atrium is normal in size.   Right Atrium  Normal sized right atrium.   Mitral Valve  Mitral valve structure is normal. There is no mitral regurgitation.   Tricuspid Valve  Tricuspid valve structure is normal. There is mild regurgitation. The right ventricular systolic pressure is normal (<36 mmHg).   Aortic Valve  The aortic valve is tricuspid. The leaflets are not thickened and exhibit normal excursion. The leaflets are mildly calcified. There is no regurgitation or stenosis.   Pulmonic Valve  The pulmonic valve was not well visualized. Trace regurgitation.   Ascending Aorta  The aortic root is normal in size.   Pericardium  There is no pericardial effusion. There is a left pleural effusion.   Study Details  A complete echo was performed using complete 2D, color flow Doppler and spectral Doppler.  During the study the apical, parasternal and subcostal view was captured. Definity contrast was injected during the study. Overall the study quality was adequate. The study was technically difficult. The study was difficult due to patient's clinical status and body habitus.     02/16/21: PCI LV end diastolic pressure is mildly elevated. ------------------------------- Lesion Segment #1: Prox LAD to Mid LAD lesion is 40% stenosed. Mid LAD-1 lesion is 95% stenosed. A drug-eluting stent was successfully placed covering both lesions, using a STENT RESOLUTE ONYX 3.0X30 -> postdilated to 3.3 mm Post intervention, there is a 0% residual stenosis throughout. --------------------------- [name] lesion is 50% stenosed. -> The intention was to avoid during this lesion. Lesion Segment #2: Prox Thomas lesion is 80% stenosed. Prox Thomas to Mid Thomas lesion is 90% stenosed. A drug-eluting stent was successfully placed covering both lesions, using a STENT RESOLUTE ONYX 2.5X30. Postdilated 2.65 mm. Post intervention, there is a 0% residual stenosis throughout..   SUMMARY Successful two-vessel PCI: Mid LAD: Resolute Onyx DES 3.0 mm x 30 mm postdilated to 3.3 mm Proximal to mid LCx: Resolute Onyx DES 2.5 mm x 30 mm postdilated to 2.65 mm LVEDP 18 mm     RECOMMENDATIONS Would strongly consider at least the short-term use of Eliquis as opposed to warfarin to facilitate ease of discharge Initial thoughts of potential discharge today, however with his uncontrolled A. fib rate, I suspect that he may need another day or so of management. We will gently hydrate, may need diuretic at the end of the day.   Echo: 02/13/21 IMPRESSIONS   1. Left ventricular ejection fraction, by  estimation, is 25%. The left  ventricle has severely decreased function. The left ventricle demonstrates  global hypokinesis. There is mild left ventricular hypertrophy. Left  ventricular diastolic parameters are  indeterminate.   2. Right  ventricular systolic function is severely reduced. The right  ventricular size is mildly enlarged. There is normal pulmonary artery  systolic pressure.   3. Left atrial size was mildly dilated.   4. Right atrial size was mildly dilated.   5. The mitral valve is grossly normal. Mild mitral valve regurgitation.   6. The aortic valve is grossly normal. Aortic valve regurgitation is not  visualized. No aortic stenosis is present.   7. The inferior vena cava is dilated in size with <50% respiratory  variability, suggesting right atrial pressure of 15 mmHg.   Conclusion(s)/Recommendation(s): No left ventricular mural or apical  thrombus/thrombi. Noncontrast images suggest fixed calcified apical  structure, which is not seen on contrast images.    02/13/21: LHC/PCI Prox Thomas lesion is 80% stenosed. Prox Thomas to Mid Thomas lesion is 90% stenosed. Prox LAD to Mid LAD lesion is 40% stenosed. Mid LAD-1 lesion is 95% stenosed. Mid LAD-2 lesion is 20% stenosed. Prox RCA lesion is 20% stenosed. Prox RCA to Mid RCA lesion is 100% stenosed. Post intervention, there is a 0% residual stenosis. A stent was successfully placed.   ST segment elevation MI with total occlusion of the large mid RCA with thrombus and initial TIMI 0 flow.   Severe concomitant multivessel disease with 40% proximal LAD stenosis followed by 90 to 95% stenosis; and 80 and 90% proximal circumflex stenoses.   LVEDP 21 mmHg   Successful PCI to the totally occluded RCA with ultimate insertion of a 3.5x18 mm Resolute Onyx stent postdilated to 3.55 mm with resumption of brisk TIMI-3 flow resulting in a large dominant RCA with the 100% occlusion being reduced to 0%.  There is mild 30% stenosis in the distal RCA before the PDA takeoff with 30% proximal PDA stenoses.   Recommendation: At present we will initiate aspirin/Plavix and hold Eliquis.  The patient will need staged intervention to his LAD and circumflex vessels.  He has reported EF in  the 20 to 25% range.  Hopefully LV function may improve following revascularization.  The patient's warfarin dose had been discontinued at [facility] earlier in the week.  The patient was uncertain when his last dose of Eliquis was but he did not take it this evening.  Continue optimal therapy for HFrEF, and aggressive lipid-lowering therapy with target LDL less than 70.   04/28/2020: TTE ([facility]) SUMMARY  The left ventricular size is normal.  Left ventricular systolic function is severely reduced.  LV ejection fraction = 20-25%.  Left ventricular filling pattern is prolonged relaxation.  Abnormal (paradoxical) septal motion consistent with LBBB.  Unable to fully assess LV regional wall motion  The right ventricle is normal size.  The right ventricular systolic function is mildly reduced.  There is mild mitral regurgitation.  No significant stenosis seen  There was insufficient TR detected to calculate RV systolic pressure.  Estimated right atrial pressure is 5 mmHg.Marland Kitchen  There is no pericardial effusion.  There is no comparison study available.     Echocardiogram 03/04/2020  1. Left ventricular ejection fraction, by estimation, is 25 to 30%. The left ventricle has severely decreased function. The left ventricle demonstrates global hypokinesis. Left ventricular diastolic function could not be evaluated. 2. Right ventricular systolic function was not well visualized. The right ventricular size  is normal. There is normal pulmonary artery systolic pressure. The estimated right ventricular systolic pressure is 21.3 mmHg. 3. Left atrial size was mildly dilated. 4. The mitral valve is normal in structure. Trivial mitral valve regurgitation. 5. The aortic valve is normal in structure. Aortic valve regurgitation is not visualized. 6. The inferior vena cava is normal in size with greater than 50% respiratory variability, suggesting right atrial pressure of 3 mmHg. Comparison(s): Prior images  reviewed side by side. Changes from prior study are noted. The left ventricular function is worsened    Recent Labs: 01/08/2021: TSH 2.395 02/13/2021: ALT 30 02/17/2021: BUN 16; Creatinine, Ser 1.14; Hemoglobin 14.5; Platelets 186; Potassium 4.6; Sodium 136  02/13/2021: Cholesterol 112; HDL 29; LDL Cholesterol 73; Total CHOL/HDL Ratio 3.9; Triglycerides 48; VLDL 10   CrCl cannot be calculated (Patient's most recent lab result is older than the maximum 21 days allowed.).   Wt Readings from Last 3 Encounters:  07/20/21 173 lb 3.2 oz (78.6 kg)  05/01/21 178 lb (80.7 kg)  02/26/21 225 lb 3.2 oz (102.2 kg)     Other studies reviewed: Additional studies/records reviewed today include: summarized above  ASSESSMENT AND PLAN:  1. PPM     Intact function, no programming changes made  He is OK coming to [place] annually, but if device needs are more often will want to perhaps change to [place] or [place] if we get an EP there going forward  2. Paroxysmal Afib     CHADs2vasc is 3, Eliquis, appropriately dose     Data is a it skewed given reporting total % from May 2021     Diagnostics clearly show a significant improvement in his AF burden in the last few months     on amiodarone  I did not order amiodarone labs today.  I will order them to be done in [place], ask my MA to schedule with the patient at his convenience   3. CM     Suspect tachy mediated vs post COVID change     Chronic CHF     No symptoms or exam findings of volume OL today April 2021, LVEF 25-30% June 2021 LVEF 20-25% during a hospitalization with COVID pneumonia, respiratory failure March 2022, STEMI, LVEF 25% April LVEF 15-20%, required milrinone during this stay, admitted with septicemia and influenza July 2022 LVEF 30-35%  I am unclear if he is on coreg or not at this time He has had reduced LVEF with some improvement by his last echo He is volume stable currently and if not on coreg would resume it and if not on  entresto would switch his ARB back if able  Coming here is difficult.I have asked that he call us or let my MA know when is a good time to call him to make sure we have an accurate medicine list  I think perhaps reasonable if we are able to titrate his meds to more aggressive GDMT a recheck of his EF in a few more months perhaps reasonable Will leave to [name] and team and have him see them in the next couple months to follow up.    4. CAD STEMI March 2022 (left ER initially AMA it seems to return with worsening symptoms PCI to RCA >> stabed LAD intervention By today's list on Plavix, statin c/w dr. Knox Saliva and team   5. Weight loss Feels full early I have urged him to discuss with his PMD for evaluation into this Discussed importance and potential concerns/causes  He reports he will talk with his PMD   Disposition: Dr. McDowell/team in 2 mo, sooner if needed, remotes Q 76mo as usual, in-clinic device check 1 year, sooner if needed.  Should his EF not improve on GDMT, perghaps a virtual visit to discuss with him would be OK   Current medicines are reviewed at length with the patient today.  The patient did not have any concerns regarding medicines.  Norma Fredrickson, PA-C 08/31/2021 9:32 AM     [facility] [address] [place] [address] [phone] (office)  [phone] (fax)

## 2021-09-01 ENCOUNTER — Ambulatory Visit: Payer: Medicare Other | Admitting: Physician Assistant

## 2021-09-01 ENCOUNTER — Other Ambulatory Visit: Payer: Self-pay

## 2021-09-01 ENCOUNTER — Encounter: Payer: Self-pay | Admitting: Physician Assistant

## 2021-09-01 VITALS — BP 120/64 | HR 67 | Ht 70.0 in | Wt 172.0 lb

## 2021-09-01 DIAGNOSIS — I428 Other cardiomyopathies: Secondary | ICD-10-CM

## 2021-09-01 DIAGNOSIS — I4891 Unspecified atrial fibrillation: Secondary | ICD-10-CM | POA: Diagnosis not present

## 2021-09-01 DIAGNOSIS — I2510 Atherosclerotic heart disease of native coronary artery without angina pectoris: Secondary | ICD-10-CM

## 2021-09-01 DIAGNOSIS — I480 Paroxysmal atrial fibrillation: Secondary | ICD-10-CM

## 2021-09-01 DIAGNOSIS — I2119 ST elevation (STEMI) myocardial infarction involving other coronary artery of inferior wall: Secondary | ICD-10-CM

## 2021-09-01 DIAGNOSIS — Z950 Presence of cardiac pacemaker: Secondary | ICD-10-CM

## 2021-09-01 DIAGNOSIS — I5022 Chronic systolic (congestive) heart failure: Secondary | ICD-10-CM

## 2021-09-01 LAB — CUP PACEART INCLINIC DEVICE CHECK
Battery Remaining Longevity: 64 mo
Battery Voltage: 2.99 V
Brady Statistic RA Percent Paced: 40 %
Brady Statistic RV Percent Paced: 8.1 %
Date Time Interrogation Session: 20221004155346
Implantable Lead Implant Date: 20001120
Implantable Lead Implant Date: 20001120
Implantable Lead Location: 753859
Implantable Lead Location: 753860
Implantable Pulse Generator Implant Date: 20171220
Lead Channel Impedance Value: 237.5 Ohm
Lead Channel Impedance Value: 437.5 Ohm
Lead Channel Pacing Threshold Amplitude: 0.5 V
Lead Channel Pacing Threshold Amplitude: 0.5 V
Lead Channel Pacing Threshold Amplitude: 1 V
Lead Channel Pacing Threshold Amplitude: 1 V
Lead Channel Pacing Threshold Pulse Width: 0.5 ms
Lead Channel Pacing Threshold Pulse Width: 0.5 ms
Lead Channel Pacing Threshold Pulse Width: 0.5 ms
Lead Channel Pacing Threshold Pulse Width: 0.5 ms
Lead Channel Sensing Intrinsic Amplitude: 5 mV
Lead Channel Sensing Intrinsic Amplitude: 9 mV
Lead Channel Setting Pacing Amplitude: 1.5 V
Lead Channel Setting Pacing Amplitude: 2.5 V
Lead Channel Setting Pacing Pulse Width: 0.5 ms
Lead Channel Setting Sensing Sensitivity: 2 mV
Pulse Gen Model: 2272
Pulse Gen Serial Number: 7983565

## 2021-09-01 NOTE — Patient Instructions (Signed)
Medication Instructions:   PLEASE CONTACT CLINIC BACK WITH MEDICATIONS IN HAND   CALL  [phone] ASK FOR Marciano Mundt OR RENEE TO DISCUSS MEDICATIONS TO MAKE SURE THEY ARE CORRECT  Your physician recommends that you continue on your current medications as directed. Please refer to the Current Medication list given to you today.   *If you need a refill on your cardiac medications before your next appointment, please call your pharmacy*   Lab Work: NONE ORDERED  TODAY   If you have labs (blood work) drawn today and your tests are completely normal, you will receive your results only by: MyChart Message (if you have MyChart) OR A paper copy in the mail If you have any lab test that is abnormal or we need to change your treatment, we will call you to review the results.   Testing/Procedures: NONE ORDERED  TODAY   Follow-Up: At [facility], you and your health needs are our priority.  As part of our continuing mission to provide you with exceptional heart care, we have created designated Provider Care Teams.  These Care Teams include your primary Cardiologist (physician) and Advanced Practice Providers (APPs -  Physician Assistants and Nurse Practitioners) who all work together to provide you with the care you need, when you need it.  We recommend signing up for the patient portal called "MyChart".  Sign up information is provided on this After Visit Summary.  MyChart is used to connect with patients for Virtual Visits (Telemedicine).  Patients are able to view lab/test results, encounter notes, upcoming appointments, etc.  Non-urgent messages can be sent to your provider as well.   To learn more about what you can do with MyChart, go to [email or web address].    Your next appointment:   3 month(s)  The format for your next appointment:   In Person  Provider:   Nena Polio, NP  Your physician wants you to follow-up in: ONE YEAR with [facility]  You will receive a reminder letter in  the mail two months in advance. If you don't receive a letter, please call our office to schedule the follow-up appointment.    Other Instructions:

## 2021-09-08 ENCOUNTER — Telehealth: Payer: Self-pay | Admitting: *Deleted

## 2021-09-08 MED ORDER — NITROGLYCERIN 0.4 MG SL SUBL
0.4000 mg | SUBLINGUAL_TABLET | SUBLINGUAL | 2 refills | Status: DC | PRN
Start: 2021-09-08 — End: 2024-09-03

## 2021-09-08 NOTE — Telephone Encounter (Signed)
Spoke with patient to verify medications on his medication list.   Patient is currently taking all medications except one Clopidogrel (Plavix) Patient asked for a refill on nitroglycerin because he was out.

## 2021-09-16 ENCOUNTER — Other Ambulatory Visit: Payer: Self-pay | Admitting: *Deleted

## 2021-09-16 ENCOUNTER — Telehealth: Payer: Self-pay | Admitting: *Deleted

## 2021-09-16 DIAGNOSIS — Z79899 Other long term (current) drug therapy: Secondary | ICD-10-CM

## 2021-09-16 NOTE — Telephone Encounter (Signed)
Attempted to reach patient to go to do lab work on day he holds Digoxin .Pt didn't answer on home phone no vm and didn't answer cell number will try again.Lab orders put in for patient to got to [facility] for lab work due living in [place].

## 2021-09-22 NOTE — Addendum Note (Signed)
Addended by: Oleta Mouse on: 09/22/2021 08:16 AM   Modules accepted: Orders

## 2021-10-27 NOTE — Progress Notes (Signed)
Error - Erroneous Encounter 

## 2021-11-02 ENCOUNTER — Ambulatory Visit (INDEPENDENT_AMBULATORY_CARE_PROVIDER_SITE_OTHER): Payer: Medicare Other

## 2021-11-02 DIAGNOSIS — I480 Paroxysmal atrial fibrillation: Secondary | ICD-10-CM

## 2021-11-03 LAB — CUP PACEART REMOTE DEVICE CHECK
Battery Remaining Longevity: 59 mo
Battery Remaining Percentage: 54 %
Battery Voltage: 2.99 V
Brady Statistic AP VP Percent: 1 %
Brady Statistic AP VS Percent: 92 %
Brady Statistic AS VP Percent: 1 %
Brady Statistic AS VS Percent: 6.8 %
Brady Statistic RA Percent Paced: 92 %
Brady Statistic RV Percent Paced: 1.2 %
Date Time Interrogation Session: 20221205020022
Implantable Lead Implant Date: 20001120
Implantable Lead Implant Date: 20001120
Implantable Lead Location: 753859
Implantable Lead Location: 753860
Implantable Pulse Generator Implant Date: 20171220
Lead Channel Impedance Value: 250 Ohm
Lead Channel Impedance Value: 440 Ohm
Lead Channel Pacing Threshold Amplitude: 0.5 V
Lead Channel Pacing Threshold Amplitude: 1 V
Lead Channel Pacing Threshold Pulse Width: 0.5 ms
Lead Channel Pacing Threshold Pulse Width: 0.5 ms
Lead Channel Sensing Intrinsic Amplitude: 4.8 mV
Lead Channel Sensing Intrinsic Amplitude: 9.3 mV
Lead Channel Setting Pacing Amplitude: 1.5 V
Lead Channel Setting Pacing Amplitude: 2.5 V
Lead Channel Setting Pacing Pulse Width: 0.5 ms
Lead Channel Setting Sensing Sensitivity: 2 mV
Pulse Gen Model: 2272
Pulse Gen Serial Number: 7983565

## 2021-11-11 NOTE — Progress Notes (Signed)
Remote pacemaker transmission.   

## 2021-12-02 ENCOUNTER — Encounter: Payer: Self-pay | Admitting: Cardiology

## 2021-12-02 ENCOUNTER — Telehealth: Payer: Self-pay | Admitting: *Deleted

## 2021-12-02 NOTE — Telephone Encounter (Signed)
Spoke with Tresa Res PA who says eliquis 5 mg was sent to pharmacy incorrectly Advised that he would be coming for visit tomorrow and rx would get sent correctly.

## 2021-12-02 NOTE — Telephone Encounter (Addendum)
Medication reviewed with [facility] pharmacist technician  During review, it was discovered that Eliquis 5 mg daily was prescribed/refilled by Tresa Res PA (PCP office) filled 12/01/21 #30 Contacted [facility] to clarify why eliquis 5 mg prescribed daily  Left message at [facility] to clarify why eliquis 5 mg was prescribed daily after holding for several minutes, disconnected call and faxed over this message

## 2021-12-02 NOTE — Progress Notes (Signed)
Cardiology Office Note  Date: 12/03/2021   ID: [name], DOB Sep 16, 1941, MRN [id number]  PCP:  Golden Pop, FNP  Cardiologist:  [name], MD Electrophysiologist:  [name], MD   Chief Complaint  Patient presents with   Cardiac follow-up    History of Present Illness: Cameron GOULDER is a medically complex 81 y.o. male last seen in October 2022 by Ms. Collene Schlichter, I reviewed the note.  I reviewed extensive records going back to March 2022 and updated the chart.  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  He underwent PCI in March 2022 in the setting of inferior STEMI.  Also hospitalized at [facility] in April 2022 with hypoxic respiratory failure complicated by rapid atrial fibrillation and influenza with sepsis.  His most recent echocardiogram in July 2022 revealed LVEF 30 to 35% range.  He is here for a follow-up visit.  Tells me that he feels different since his heart attack, less patient, loses his temper more than he used to.  Still lives in the same home, takes care of 12 cats.  He does not report any angina or nitroglycerin use.  He has good days and bad days.  He has not been weighing every day, but states that when he does his weight has been stable.  Does not report any orthopnea or PND, no leg swelling.  He has a [employer]. Jude dual-chamber pacemaker in place that has been followed with by Dr. Graciela Husbands and in the device clinic.  Last interrogation from December demonstrated normal device function, less than 1% atrial fibrillation burden.  Has had no sense of palpitations.  I personally reviewed his follow-up ECG today which shows an atrial paced rhythm with left bundle branch block.  Plan to request recent lab work from PCP office.  We also discussed updating his echocardiogram.  Medications were reviewed in detail including call to pharmacy.  He has not been taking  Plavix since this past summer.  Was also prescribed Eliquis 5 mg once daily by his PCP, we have updated this to twice daily.  Past Medical History:  Diagnosis Date   Acute ST elevation myocardial infarction (STEMI) of inferior wall [facility])    March 2022   Asthma    Bronchitis    CAD (coronary artery disease)    DES to RCA with staged DES to mid LAD and DES to proximal to mid circumflex March 2022   Cardiomyopathy [facility])    Possibly tachycardia mediated, also history of COVID-19 and CAD   History of epididymitis    History of recurrent UTIs    History of ruptured appendix    Paroxysmal atrial fibrillation (HCC)    Peptic ulcer disease    Pneumonia due to COVID-19 virus    Presence of permanent cardiac pacemaker    Tachy-brady syndrome (HCC)    PPM (SJM) implanted by Dr Graciela Husbands 10/19/99, gen change 04/03/08    Past Surgical History:  Procedure Laterality Date   APPENDECTOMY     CARDIOVERSION [name] 02/05/2021   Procedure: CARDIOVERSION;  Surgeon: Jonelle Sidle, MD;  Location: [facility];  Service: Cardiovascular;  Laterality: [name];   CORONARY STENT INTERVENTION [name] 02/16/2021   Procedure: CORONARY STENT INTERVENTION;  Surgeon: Marykay Lex, MD;  Location: [facility] INVASIVE CV LAB;  Service: Cardiovascular;  Laterality: [name];   CORONARY/GRAFT ACUTE MI REVASCULARIZATION [name] 02/13/2021   Procedure: Coronary/Graft Acute MI  Revascularization;  Surgeon: Troy Sine, MD;  Location: [place] CV LAB;  Service: Cardiovascular;  Laterality: [name];   EP IMPLANTABLE DEVICE [name] 11/17/2016   Procedure: PPM Generator Changeout;  Surgeon: Evans Lance, MD;  Location: [place] CV LAB;  Service: Cardiovascular;  Laterality: [name];   INSERT / REPLACE / REMOVE PACEMAKER     LEFT HEART CATH [name] 02/16/2021   Procedure: Left Heart Cath;  Surgeon: Leonie Man, MD;  Location: [place] CV LAB;  Service: Cardiovascular;  Laterality: [name];   LEFT HEART CATH AND CORONARY ANGIOGRAPHY [name] 02/13/2021   Procedure:  LEFT HEART CATH AND CORONARY ANGIOGRAPHY;  Surgeon: Troy Sine, MD;  Location: [place] CV LAB;  Service: Cardiovascular;  Laterality: [name];   PACEMAKER PLACEMENT  03/2008   [employer]. Jude, [place]. 5826    Current Outpatient Medications  Medication Sig Dispense Refill   amiodarone (PACERONE) 200 MG tablet Take 200 mg by mouth daily.     atorvastatin (LIPITOR) 80 MG tablet Take 1 tablet (80 mg total) by mouth daily. 90 tablet 3   digoxin (LANOXIN) 0.125 MG tablet Take 0.125 mg by mouth daily.     furosemide (LASIX) 40 MG tablet Take 40 mg by mouth daily.     losartan (COZAAR) 25 MG tablet Take 0.5 tablets (12.5 mg total) by mouth daily. 45 tablet 3   Menthol-Zinc Oxide (CALMOSEPTINE) 0.44-20.6 % OINT Apply topically. As needed to groin area.     nitroGLYCERIN (NITROSTAT) 0.4 MG SL tablet Place 1 tablet (0.4 mg total) under the tongue every 5 (five) minutes as needed. 25 tablet 2   pantoprazole (PROTONIX) 40 MG tablet Take 1 tablet (40 mg total) by mouth daily. 90 tablet 3   ELIQUIS 5 MG TABS tablet Take 1 tablet (5 mg total) by mouth 2 (two) times daily. 60 tablet 6   No current facility-administered medications for this visit.   Allergies:  Bee venom, Corn-containing products, Citrullus vulgaris, Codeine, Invert sugar, Rice, and Vanilla   ROS: No syncope.  Physical Exam: VS:  BP 118/62    Ht 5\' 10"  (1.778 m)    Wt 180 lb 9.6 oz (81.9 kg)    BMI 25.91 kg/m , BMI Body mass index is 25.91 kg/m.  Wt Readings from Last 3 Encounters:  12/03/21 180 lb 9.6 oz (81.9 kg)  09/01/21 172 lb (78 kg)  07/20/21 173 lb 3.2 oz (78.6 kg)    General: Patient appears comfortable at rest. HEENT: Conjunctiva and lids normal, wearing a mask. Neck: Supple, no elevated JVP or carotid bruits, no thyromegaly. Lungs: Clear to auscultation, nonlabored breathing at rest. Cardiac: Regular rate and rhythm, no S3, 2/6 systolic murmur, no pericardial rub. Extremities: Trace ankle edema.  ECG:  An ECG dated  02/16/2021 was personally reviewed today and demonstrated:  Atrail fibrillation with occasional ventricular paced beat and left bundle branch block.  Recent Labwork: 01/08/2021: TSH 2.395 02/13/2021: ALT 30; AST 29 02/17/2021: BUN 16; Creatinine, Ser 1.14; Hemoglobin 14.5; Platelets 186; Potassium 4.6; Sodium 136     Component Value Date/Time   CHOL 112 02/13/2021 0425   TRIG 48 02/13/2021 0425   HDL 29 (L) 02/13/2021 0425   CHOLHDL 3.9 02/13/2021 0425   VLDL 10 02/13/2021 0425   LDLCALC 73 02/13/2021 0425   LDLDIRECT 147.9 08/30/2008 0842    Other Studies Reviewed Today:  Echocardiogram 06/18/2021:  1. Left ventricular ejection fraction, by estimation, is 30 to 35%. The  left ventricle has moderately decreased  function. The left ventricle  demonstrates global hypokinesis. Left ventricular diastolic parameters are  consistent with Grade I diastolic  dysfunction (impaired relaxation). Elevated left atrial pressure. The  average left ventricular global longitudinal strain is -8.9 %. The global  longitudinal strain is abnormal.   2. RV not well visualized, grossly appears normal in size and function. .  Right ventricular systolic function was not well visualized. The right  ventricular size is not well visualized. There is mildly elevated  pulmonary artery systolic pressure.   3. The mitral valve is normal in structure. Mild mitral valve  regurgitation. No evidence of mitral stenosis.   4. The aortic valve was not well visualized. There is mild calcification  of the aortic valve. There is mild thickening of the aortic valve. Aortic  valve regurgitation is not visualized. No aortic stenosis is present.   5. Aortic atherosclerosis is noted.   6. The inferior vena cava is normal in size with greater than 50%  respiratory variability, suggesting right atrial pressure of 3 mmHg.   Assessment and Plan:  1.  HFrEF with chronic systolic heart failure and mixed cardiomyopathy (has history of  CAD with inferior STEMI and revascularization, COVID-19, tachycardia mediated cardiomyopathy, influenza, and paroxysmal atrial fibrillation all as potential contributors).  Weight is up from October by 8 pounds, but reportedly stable by his home checks, no progressive peripheral edema, no orthopnea or PND.  Reports good urine output with current diuretic dosing.  He is currently on Lanoxin, losartan, and Lasix (had been on Entresto and Coreg as of March 2022 - therapy modified in the interim after stay at [facility]).  Requesting most recent lab work from PCP for review of renal function and potassium.  We will also update echocardiogram for reassessment of LVEF.  Anticipate further medication titration as tolerated.  2.  Paroxysmal atrial fibrillation with CHA2DS2-VASc score of 3.  He is currently on amiodarone for rhythm suppression and Eliquis for stroke prophylaxis.  Requesting interval lab work from PCP.  His last device interrogation indicated less than 1% atrial fibrillation burden.  3.  CAD status post inferior STEMI in March 2022 and status post DES to the RCA with staged DES of the mid LAD and DES to the proximal to mid circumflex.  He is no longer on Plavix.  No active angina reported.  Continue Lipitor and as needed nitroglycerin.  4.  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 Caryl Comes and the device clinic.  Most recent device interrogation indicated normal function.  Medication Adjustments/Labs and Tests Ordered: Current medicines are reviewed at length with the patient today.  Concerns regarding medicines are outlined above.   Tests Ordered: Orders Placed This Encounter  Procedures   CBC   Basic metabolic panel   EKG [id number]   ECHOCARDIOGRAM COMPLETE    Medication Changes: Meds ordered this encounter  Medications   ELIQUIS 5 MG TABS tablet    Sig: Take 1 tablet (5 mg total) by mouth 2 (two) times daily.    Dispense:  60 tablet    Refill:   6    Disposition:  Follow up  3 months.  Signed, Satira Sark, MD, [facility] 12/03/2021 12:37 PM    [place] [facility] [place], [place], [place] [address] Phone: [phone]; Fax: [phone]

## 2021-12-03 ENCOUNTER — Ambulatory Visit (INDEPENDENT_AMBULATORY_CARE_PROVIDER_SITE_OTHER): Payer: Medicare Other | Admitting: Cardiology

## 2021-12-03 ENCOUNTER — Encounter: Payer: Self-pay | Admitting: *Deleted

## 2021-12-03 ENCOUNTER — Encounter: Payer: Self-pay | Admitting: Cardiology

## 2021-12-03 ENCOUNTER — Ambulatory Visit: Payer: Medicare Other | Admitting: Family Medicine

## 2021-12-03 VITALS — BP 118/62 | Ht 70.0 in | Wt 180.6 lb

## 2021-12-03 DIAGNOSIS — I5020 Unspecified systolic (congestive) heart failure: Secondary | ICD-10-CM | POA: Diagnosis not present

## 2021-12-03 DIAGNOSIS — Z79899 Other long term (current) drug therapy: Secondary | ICD-10-CM | POA: Diagnosis not present

## 2021-12-03 DIAGNOSIS — I25119 Atherosclerotic heart disease of native coronary artery with unspecified angina pectoris: Secondary | ICD-10-CM

## 2021-12-03 DIAGNOSIS — I480 Paroxysmal atrial fibrillation: Secondary | ICD-10-CM | POA: Diagnosis not present

## 2021-12-03 MED ORDER — ELIQUIS 5 MG PO TABS: 5.0000 mg | ORAL_TABLET | Freq: Two times a day (BID) | ORAL | 6 refills | Status: DC

## 2021-12-03 NOTE — Patient Instructions (Addendum)
Medication Instructions:  Your physician recommends that you continue on your current medications as directed. Please refer to the Current Medication list given to you today.  Labwork: BMET & CBC in 3 months just before your next visit in April 2023.  [facility] or [facility] No appointment needed-walk in Non-fasting  Testing/Procedures: Your physician has requested that you have an echocardiogram. Echocardiography is a painless test that uses sound waves to create images of your heart. It provides your doctor with information about the size and shape of your heart and how well your hearts chambers and valves are working. This procedure takes approximately one hour. There are no restrictions for this procedure.  Follow-Up: Your physician recommends that you schedule a follow-up appointment in: 3 months  Any Other Special Instructions Will Be Listed Below (If Applicable).  If you need a refill on your cardiac medications before your next appointment, please call your pharmacy.

## 2022-01-07 ENCOUNTER — Other Ambulatory Visit: Payer: Medicare Other

## 2022-02-01 ENCOUNTER — Ambulatory Visit (INDEPENDENT_AMBULATORY_CARE_PROVIDER_SITE_OTHER): Payer: Medicare Other

## 2022-02-01 DIAGNOSIS — I480 Paroxysmal atrial fibrillation: Secondary | ICD-10-CM | POA: Diagnosis not present

## 2022-02-02 LAB — CUP PACEART REMOTE DEVICE CHECK
Battery Remaining Longevity: 56 mo
Battery Remaining Percentage: 52 %
Battery Voltage: 2.98 V
Brady Statistic AP VP Percent: 1 %
Brady Statistic AP VS Percent: 92 %
Brady Statistic AS VP Percent: 1 %
Brady Statistic AS VS Percent: 7 %
Brady Statistic RA Percent Paced: 92 %
Brady Statistic RV Percent Paced: 1 %
Date Time Interrogation Session: 20230306035817
Implantable Lead Implant Date: 20001120
Implantable Lead Implant Date: 20001120
Implantable Lead Location: 753859
Implantable Lead Location: 753860
Implantable Pulse Generator Implant Date: 20171220
Lead Channel Impedance Value: 240 Ohm
Lead Channel Impedance Value: 430 Ohm
Lead Channel Pacing Threshold Amplitude: 0.625 V
Lead Channel Pacing Threshold Amplitude: 1 V
Lead Channel Pacing Threshold Pulse Width: 0.5 ms
Lead Channel Pacing Threshold Pulse Width: 0.5 ms
Lead Channel Sensing Intrinsic Amplitude: 5 mV
Lead Channel Sensing Intrinsic Amplitude: 8.2 mV
Lead Channel Setting Pacing Amplitude: 1.625
Lead Channel Setting Pacing Amplitude: 2.5 V
Lead Channel Setting Pacing Pulse Width: 0.5 ms
Lead Channel Setting Sensing Sensitivity: 2 mV
Pulse Gen Model: 2272
Pulse Gen Serial Number: 7983565

## 2022-02-11 NOTE — Progress Notes (Signed)
Remote pacemaker transmission.   

## 2022-02-24 ENCOUNTER — Ambulatory Visit (INDEPENDENT_AMBULATORY_CARE_PROVIDER_SITE_OTHER): Payer: Medicare Other

## 2022-02-24 DIAGNOSIS — I25119 Atherosclerotic heart disease of native coronary artery with unspecified angina pectoris: Secondary | ICD-10-CM

## 2022-02-24 LAB — ECHOCARDIOGRAM COMPLETE
AR max vel: 2.01 cm2
AV Area VTI: 1.92 cm2
AV Area mean vel: 1.78 cm2
AV Mean grad: 8 mmHg
AV Peak grad: 13.1 mmHg
Ao pk vel: 1.81 m/s
Area-P 1/2: 2.24 cm2
S' Lateral: 3.59 cm

## 2022-02-25 ENCOUNTER — Other Ambulatory Visit: Payer: Medicare Other

## 2022-03-04 ENCOUNTER — Encounter: Payer: Self-pay | Admitting: Cardiology

## 2022-03-04 ENCOUNTER — Ambulatory Visit (INDEPENDENT_AMBULATORY_CARE_PROVIDER_SITE_OTHER): Payer: Medicare Other | Admitting: Cardiology

## 2022-03-04 VITALS — BP 104/64 | HR 72 | Ht 70.0 in | Wt 188.4 lb

## 2022-03-04 DIAGNOSIS — Z8679 Personal history of other diseases of the circulatory system: Secondary | ICD-10-CM | POA: Diagnosis not present

## 2022-03-04 DIAGNOSIS — I480 Paroxysmal atrial fibrillation: Secondary | ICD-10-CM

## 2022-03-04 DIAGNOSIS — I25119 Atherosclerotic heart disease of native coronary artery with unspecified angina pectoris: Secondary | ICD-10-CM | POA: Diagnosis not present

## 2022-03-04 NOTE — Patient Instructions (Addendum)
Medication Instructions:   Your physician recommends that you continue on your current medications as directed. Please refer to the Current Medication list given to you today.  Labwork:  none  Testing/Procedures:  none  Follow-Up:  Your physician recommends that you schedule a follow-up appointment in: 3 months.  Any Other Special Instructions Will Be Listed Below (If Applicable).  If you need a refill on your cardiac medications before your next appointment, please call your pharmacy. 

## 2022-03-04 NOTE — Progress Notes (Signed)
? ? ?Cardiology Office Note ? ?Date: 03/04/2022  ? ?ID: [name], DOB February 06, 1941, MRN [id number] ? ?PCP:  Ralph Leyden, FNP  ?Cardiologist:  Rozann Lesches, MD ?Electrophysiologist:  Virl Axe, MD  ? ?Chief Complaint  ?Patient presents with  ? Cardiac follow-up  ? ? ?History of Present Illness: ?[name] is a 81 y.o. male last seen in January.  He is here for a follow-up visit.  Reports recent trouble with a "chest cold" and also allergies.  He was prescribed doxycycline by PCP and also sent for chest x-ray, he has not yet heard results. ? ?St. Jude dual-chamber pacemaker in place with follow-up in the device clinic.  Last device interrogation revealed normal function with less than 1% atrial fibrillation burden. ? ?States that he had lab work with PCP, we are requesting the results.  I reviewed his medications which are outlined below and stable from a cardiac perspective.  He reports consistent use of diuretic, no bleeding problems on Eliquis. ? ?Follow-up echocardiogram in March showed improvement in LVEF to the range of 50 to 55%, report noted below.  We went over the results today. ? ?Past Medical History:  ?Diagnosis Date  ? Acute ST elevation myocardial infarction (STEMI) of inferior wall (HCC)   ? March 2022  ? Asthma   ? Bronchitis   ? CAD (coronary artery disease)   ? DES to RCA with staged DES to mid LAD and DES to proximal to mid circumflex March 2022  ? Cardiomyopathy ([place])   ? Possibly tachycardia mediated, also history of COVID-19 and CAD  ? History of epididymitis   ? History of recurrent UTIs   ? History of ruptured appendix   ? Paroxysmal atrial fibrillation (HCC)   ? Peptic ulcer disease   ? Pneumonia due to COVID-19 virus   ? Presence of permanent cardiac pacemaker   ? Tachy-brady syndrome ([place])   ? PPM (SJM) implanted by Dr Caryl Comes 10/19/99, gen change 04/03/08  ? ? ?Past Surgical History:  ?Procedure Laterality Date  ? APPENDECTOMY    ? CARDIOVERSION [name] 02/05/2021  ? Procedure:  CARDIOVERSION;  Surgeon: Satira Sark, MD;  Location: [facility];  Service: Cardiovascular;  Laterality: [name];  ? CORONARY STENT INTERVENTION [name] 02/16/2021  ? Procedure: CORONARY STENT INTERVENTION;  Surgeon: Leonie Man, MD;  Location: [place] [facility];  Service: Cardiovascular;  Laterality: [name];  ? CORONARY/GRAFT ACUTE MI REVASCULARIZATION [name] 02/13/2021  ? Procedure: Coronary/Graft Acute MI Revascularization;  Surgeon: Troy Sine, MD;  Location: [place] [facility];  Service: Cardiovascular;  Laterality: [name];  ? EP IMPLANTABLE DEVICE [name] 11/17/2016  ? Procedure: PPM Generator Changeout;  Surgeon: Evans Lance, MD;  Location: [place] [facility];  Service: Cardiovascular;  Laterality: [name];  ? INSERT / REPLACE / REMOVE PACEMAKER    ? LEFT HEART CATH [name] 02/16/2021  ? Procedure: Left Heart Cath;  Surgeon: Leonie Man, MD;  Location: [place] [facility];  Service: Cardiovascular;  Laterality: [name];  ? LEFT HEART CATH AND CORONARY ANGIOGRAPHY [name] 02/13/2021  ? Procedure: LEFT HEART CATH AND CORONARY ANGIOGRAPHY;  Surgeon: Troy Sine, MD;  Location: [place] [facility];  Service: Cardiovascular;  Laterality: [name];  ? PACEMAKER PLACEMENT  03/2008  ? St. Jude, [place]. [id number]  ? ? ?Current Outpatient Medications  ?Medication Sig Dispense Refill  ? amiodarone (PACERONE) 200 MG tablet Take 200 mg by mouth daily.    ? atorvastatin (LIPITOR) 80 MG tablet  Take 1 tablet (80 mg total) by mouth daily. 90 tablet 3  ? digoxin (LANOXIN) 0.125 MG tablet Take 0.125 mg by mouth daily.    ? doxycycline (VIBRAMYCIN) 100 MG capsule Take 100 mg by mouth 2 (two) times daily.    ? ELIQUIS 5 MG TABS tablet Take 1 tablet (5 mg total) by mouth 2 (two) times daily. 60 tablet 6  ? furosemide (LASIX) 40 MG tablet Take 40 mg by mouth daily.    ? losartan (COZAAR) 25 MG tablet Take 0.5 tablets (12.5 mg total) by mouth daily. 45 tablet 3  ? Menthol-Zinc Oxide (CALMOSEPTINE) 0.44-20.6 % OINT Apply topically. As needed to groin area.    ?  nitroGLYCERIN (NITROSTAT) 0.4 MG SL tablet Place 1 tablet (0.4 mg total) under the tongue every 5 (five) minutes as needed. 25 tablet 2  ? pantoprazole (PROTONIX) 40 MG tablet Take 1 tablet (40 mg total) by mouth daily. 90 tablet 3  ? ?No current facility-administered medications for this visit.  ? ?Allergies:  Bee venom, Corn-containing products, Citrullus vulgaris, Codeine, Invert sugar, Rice, and Vanilla  ? ?ROS: No syncope. ? ?Physical Exam: ?VS:  BP 104/64   Pulse 72   Ht 5\' 10"  (1.778 m)   Wt 188 lb 6.4 oz (85.5 kg)   SpO2 94%   BMI 27.03 kg/m? , BMI Body mass index is 27.03 kg/m?. ? ?Wt Readings from Last 3 Encounters:  ?03/04/22 188 lb 6.4 oz (85.5 kg)  ?12/03/21 180 lb 9.6 oz (81.9 kg)  ?09/01/21 172 lb (78 kg)  ?  ?General: Patient appears comfortable at rest. ?HEENT: Conjunctiva and lids normal. ?Neck: Supple, no elevated JVP. ?Lungs: Clear to auscultation, nonlabored breathing at rest. ?Cardiac: Regular rate and rhythm, no S3, 2/6 systolic murmur, no pericardial rub. ?Extremities: No pitting edema. ? ?ECG:  An ECG dated 12/03/2021 was personally reviewed today and demonstrated:  Atrial paced rhythm with left bundle branch block. ? ?Recent Labwork: ?   ?Component Value Date/Time  ? CHOL 112 02/13/2021 0425  ? TRIG 48 02/13/2021 0425  ? HDL 29 (L) 02/13/2021 0425  ? CHOLHDL 3.9 02/13/2021 0425  ? VLDL 10 02/13/2021 0425  ? LDLCALC 73 02/13/2021 0425  ? LDLDIRECT 147.9 08/30/2008 0842  ? ? ?Other Studies Reviewed Today: ? ?Echocardiogram 02/24/2022: ? 1. Left ventricular ejection fraction, by estimation, is 50 to 55%. The  ?left ventricle has low normal function. The left ventricle demonstrates  ?global hypokinesis. Left ventricular diastolic parameters are consistent  ?with Grade I diastolic dysfunction  ?(impaired relaxation). Mildly reduced global longitudinal strain of  ?-15.5%.  ? 2. Right ventricular systolic function is normal. The right ventricular  ?size is normal. There is mildly elevated  pulmonary artery systolic  ?pressure. The estimated right ventricular systolic pressure is 36.2 mmHg.  ? 3. The mitral valve is grossly normal. Trivial mitral valve  ?regurgitation.  ? 4. The aortic valve is tricuspid. Aortic valve regurgitation is not  ?visualized. Aortic valve sclerosis/calcification is present, without any  ?evidence of aortic stenosis. Aortic valve mean gradient measures 8.0 mmHg.  ? 5. Aortic dilatation noted. 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  ? ?Assessment and Plan: ? ?1.  HFrecEF, history of mixed cardiomyopathy (has history of CAD with inferior STEMI and revascularization, COVID-19, tachycardia mediated cardiomyopathy, influenza, and paroxysmal atrial fibrillation all as potential contributors) now with significant improvement in LVEF in the range of 50 to 55% by recent evaluation.  Plan is  to continue medical therapy and observation, requesting recent lab work from PCP.  Currently on Lanoxin, Cozaar, and Lasix.  Was taken off Coreg and Entresto previously as discussed in the prior note. ? ?2.  Paroxysmal atrial fibrillation with CHA2DS2-VASc score of 3.  He has had good rhythm control on amiodarone and remains on Eliquis for stroke prophylaxis without spontaneous bleeding problems. ? ?3.  CAD status post inferior STEMI in March 2022 status post DES to the RCA with staged DES of the mid LAD and DES of the proximal to mid circumflex.  Completed course of Plavix.  He reports no active angina at this time and continues on Lipitor. ? ?4.  St. Jude pacemaker in place with history of tachycardia-bradycardia syndrome.  He continues to follow in the EP clinic ? ?Medication Adjustments/Labs and Tests Ordered: ?Current medicines are reviewed at length with the patient today.  Concerns regarding medicines are outlined above.  ? ?Tests Ordered: ?No orders of the defined types were placed in this encounter. ? ? ?Medication Changes: ?No orders of the defined types were  placed in this encounter. ? ? ?Disposition:  Follow up  3 months. ? ?Signed, ?Satira Sark, MD, [facility] ?03/04/2022 2:42 PM    ?[place] at [facility] ?[place], [place], [place]

## 2022-03-25 ENCOUNTER — Other Ambulatory Visit: Payer: Medicare Other

## 2022-05-03 ENCOUNTER — Ambulatory Visit (INDEPENDENT_AMBULATORY_CARE_PROVIDER_SITE_OTHER): Payer: Medicare Other

## 2022-05-03 DIAGNOSIS — I480 Paroxysmal atrial fibrillation: Secondary | ICD-10-CM

## 2022-05-05 LAB — CUP PACEART REMOTE DEVICE CHECK
Battery Remaining Longevity: 53 mo
Battery Remaining Percentage: 50 %
Battery Voltage: 2.98 V
Brady Statistic AP VP Percent: 1 %
Brady Statistic AP VS Percent: 93 %
Brady Statistic AS VP Percent: 1 %
Brady Statistic AS VS Percent: 6.2 %
Brady Statistic RA Percent Paced: 92 %
Brady Statistic RV Percent Paced: 1.4 %
Date Time Interrogation Session: 20230605020014
Implantable Lead Implant Date: 20001120
Implantable Lead Implant Date: 20001120
Implantable Lead Location: 753859
Implantable Lead Location: 753860
Implantable Pulse Generator Implant Date: 20171220
Lead Channel Impedance Value: 240 Ohm
Lead Channel Impedance Value: 400 Ohm
Lead Channel Pacing Threshold Amplitude: 0.5 V
Lead Channel Pacing Threshold Amplitude: 1 V
Lead Channel Pacing Threshold Pulse Width: 0.5 ms
Lead Channel Pacing Threshold Pulse Width: 0.5 ms
Lead Channel Sensing Intrinsic Amplitude: 5 mV
Lead Channel Sensing Intrinsic Amplitude: 8.3 mV
Lead Channel Setting Pacing Amplitude: 1.5 V
Lead Channel Setting Pacing Amplitude: 2.5 V
Lead Channel Setting Pacing Pulse Width: 0.5 ms
Lead Channel Setting Sensing Sensitivity: 2 mV
Pulse Gen Model: 2272
Pulse Gen Serial Number: 7983565

## 2022-05-12 ENCOUNTER — Ambulatory Visit (INDEPENDENT_AMBULATORY_CARE_PROVIDER_SITE_OTHER): Payer: Medicare Other

## 2022-05-12 DIAGNOSIS — I428 Other cardiomyopathies: Secondary | ICD-10-CM

## 2022-05-13 ENCOUNTER — Telehealth: Payer: Self-pay

## 2022-05-13 NOTE — Telephone Encounter (Signed)
Remote received. Presenting rhythm AP/VS.

## 2022-05-13 NOTE — Telephone Encounter (Signed)
I spoke with [name] and assist him into sending a transmission. Transmission received.

## 2022-05-13 NOTE — Telephone Encounter (Signed)
-----   Message from Duke Salvia, MD sent at 05/12/2022  6:31 PM EDT ----- Remote reviewed. This remote is abnormal for persistent afib Can you have her transmit again and let me know waht it shows   Thanks SK

## 2022-05-14 ENCOUNTER — Telehealth: Payer: Self-pay

## 2022-05-14 NOTE — Telephone Encounter (Signed)
Patient is now back in normal rhythm.

## 2022-05-14 NOTE — Telephone Encounter (Signed)
-----   Message from Steven C Klein, MD sent at 05/12/2022  6:31 PM EDT ----- Remote reviewed. This remote is abnormal for persistent afib Can you have her transmit again and let me know waht it shows   Thanks SK    

## 2022-05-15 LAB — CUP PACEART REMOTE DEVICE CHECK
Battery Remaining Longevity: 53 mo
Battery Remaining Percentage: 49 %
Battery Voltage: 2.98 V
Brady Statistic AP VP Percent: 1 %
Brady Statistic AP VS Percent: 93 %
Brady Statistic AS VP Percent: 1 %
Brady Statistic AS VS Percent: 6.1 %
Brady Statistic RA Percent Paced: 92 %
Brady Statistic RV Percent Paced: 1.5 %
Date Time Interrogation Session: 20230614020014
Implantable Lead Implant Date: 20001120
Implantable Lead Implant Date: 20001120
Implantable Lead Location: 753859
Implantable Lead Location: 753860
Implantable Pulse Generator Implant Date: 20171220
Lead Channel Impedance Value: 240 Ohm
Lead Channel Impedance Value: 430 Ohm
Lead Channel Pacing Threshold Amplitude: 0.625 V
Lead Channel Pacing Threshold Amplitude: 1 V
Lead Channel Pacing Threshold Pulse Width: 0.5 ms
Lead Channel Pacing Threshold Pulse Width: 0.5 ms
Lead Channel Sensing Intrinsic Amplitude: 5 mV
Lead Channel Sensing Intrinsic Amplitude: 8.2 mV
Lead Channel Setting Pacing Amplitude: 1.625
Lead Channel Setting Pacing Amplitude: 2.5 V
Lead Channel Setting Pacing Pulse Width: 0.5 ms
Lead Channel Setting Sensing Sensitivity: 2 mV
Pulse Gen Model: 2272
Pulse Gen Serial Number: 7983565

## 2022-05-18 NOTE — Progress Notes (Signed)
Remote pacemaker transmission.   

## 2022-05-24 ENCOUNTER — Telehealth: Payer: Self-pay

## 2022-06-16 ENCOUNTER — Ambulatory Visit: Payer: Medicare Other | Admitting: Cardiology

## 2022-06-16 NOTE — Progress Notes (Deleted)
Cardiology Office Note  Date: 06/16/2022   ID: [name], DOB [birth year], MRN [id number]  PCP:  Ralph Leyden, FNP  Cardiologist:  Rozann Lesches, MD Electrophysiologist:  Virl Axe, MD   No chief complaint on file.   History of Present Illness: [name] is a 81 y.o. male last seen in April.  Saint Jude dual-chamber pacemaker in place with follow-up in the device clinic and with Dr. Caryl Comes.  Device check in June showed AF burden 1.5%.  Past Medical History:  Diagnosis Date   Acute ST elevation myocardial infarction (STEMI) of inferior wall [facility])    March 2022   Asthma    Bronchitis    CAD (coronary artery disease)    DES to RCA with staged DES to mid LAD and DES to proximal to mid circumflex March 2022   Cardiomyopathy [facility])    Possibly tachycardia mediated, also history of COVID-19 and CAD   History of epididymitis    History of recurrent UTIs    History of ruptured appendix    Paroxysmal atrial fibrillation ([place])    Peptic ulcer disease    Pneumonia due to COVID-19 virus    Presence of permanent cardiac pacemaker    Tachy-brady syndrome ([place])    PPM (SJM) implanted by Dr Caryl Comes 10/19/99, gen change 04/03/08    Past Surgical History:  Procedure Laterality Date   APPENDECTOMY     CARDIOVERSION [name] 02/05/2021   Procedure: CARDIOVERSION;  Surgeon: Satira Sark, MD;  Location: [facility];  Service: Cardiovascular;  Laterality: [name];   CORONARY STENT INTERVENTION [name] 02/16/2021   Procedure: CORONARY STENT INTERVENTION;  Surgeon: Leonie Man, MD;  Location: [place] [facility];  Service: Cardiovascular;  Laterality: [name];   CORONARY/GRAFT ACUTE MI REVASCULARIZATION [name] 02/13/2021   Procedure: Coronary/Graft Acute MI Revascularization;  Surgeon: Troy Sine, MD;  Location: [place] [facility];  Service: Cardiovascular;  Laterality: [name];   EP IMPLANTABLE DEVICE [name] 11/17/2016   Procedure: PPM Generator Changeout;  Surgeon: Evans Lance, MD;   Location: [place] [facility];  Service: Cardiovascular;  Laterality: [name];   INSERT / REPLACE / REMOVE PACEMAKER     LEFT HEART CATH [name] 02/16/2021   Procedure: Left Heart Cath;  Surgeon: Leonie Man, MD;  Location: [place] [facility];  Service: Cardiovascular;  Laterality: [name];   LEFT HEART CATH AND CORONARY ANGIOGRAPHY [name] 02/13/2021   Procedure: LEFT HEART CATH AND CORONARY ANGIOGRAPHY;  Surgeon: Troy Sine, MD;  Location: [place] [facility];  Service: Cardiovascular;  Laterality: [name];   PACEMAKER PLACEMENT  03/2008   St. Jude, [place]. 5826    Current Outpatient Medications  Medication Sig Dispense Refill   amiodarone (PACERONE) 200 MG tablet Take 200 mg by mouth daily.     atorvastatin (LIPITOR) 80 MG tablet Take 1 tablet (80 mg total) by mouth daily. 90 tablet 3   digoxin (LANOXIN) 0.125 MG tablet Take 0.125 mg by mouth daily.     doxycycline (VIBRAMYCIN) 100 MG capsule Take 100 mg by mouth 2 (two) times daily.     ELIQUIS 5 MG TABS tablet Take 1 tablet (5 mg total) by mouth 2 (two) times daily. 60 tablet 6   furosemide (LASIX) 40 MG tablet Take 40 mg by mouth daily.     losartan (COZAAR) 25 MG tablet Take 0.5 tablets (12.5 mg total) by mouth daily. 45 tablet 3   Menthol-Zinc Oxide (CALMOSEPTINE) 0.44-20.6 % OINT Apply topically. As  needed to groin area.     nitroGLYCERIN (NITROSTAT) 0.4 MG SL tablet Place 1 tablet (0.4 mg total) under the tongue every 5 (five) minutes as needed. 25 tablet 2   pantoprazole (PROTONIX) 40 MG tablet Take 1 tablet (40 mg total) by mouth daily. 90 tablet 3   No current facility-administered medications for this visit.   Allergies:  Bee venom, Corn-containing products, Citrullus vulgaris, Codeine, Invert sugar, Rice, and Vanilla   Social History: The patient  reports that he quit smoking about 44 years ago. His smoking use included cigarettes. He started smoking about 62 years ago. He has a 30.00 pack-year smoking history. He has never used smokeless  tobacco. He reports that he does not drink alcohol and does not use drugs.   Family History: The patient's family history includes Cancer in his father; Suicidality in his mother.   ROS:  Please see the history of present illness. Otherwise, complete review of systems is positive for {NONE DEFAULTED:18576}.  All other systems are reviewed and negative.   Physical Exam: VS:  There were no vitals taken for this visit., BMI There is no height or weight on file to calculate BMI.  Wt Readings from Last 3 Encounters:  03/04/22 188 lb 6.4 oz (85.5 kg)  12/03/21 180 lb 9.6 oz (81.9 kg)  09/01/21 172 lb (78 kg)    General: Patient appears comfortable at rest. HEENT: Conjunctiva and lids normal, oropharynx clear with moist mucosa. Neck: Supple, no elevated JVP or carotid bruits, no thyromegaly. Lungs: Clear to auscultation, nonlabored breathing at rest. Cardiac: Regular rate and rhythm, no S3 or significant systolic murmur, no pericardial rub. Abdomen: Soft, nontender, no hepatomegaly, bowel sounds present, no guarding or rebound. Extremities: No pitting edema, distal pulses 2+. Skin: Warm and dry. Musculoskeletal: No kyphosis. Neuropsychiatric: Alert and oriented x3, affect grossly appropriate.  ECG:  An ECG dated 12/03/2021 was personally reviewed today and demonstrated:  Dual chamber pacing.  Recent Labwork:    Component Value Date/Time   CHOL 112 02/13/2021 0425   TRIG 48 02/13/2021 0425   HDL 29 (L) 02/13/2021 0425   CHOLHDL 3.9 02/13/2021 0425   VLDL 10 02/13/2021 0425   LDLCALC 73 02/13/2021 0425   LDLDIRECT 147.9 08/30/2008 0842  August 2022: BUN 13, creatinine 1.14, potassium 4.2, AST 16, ALT 12, cholesterol 205, triglycerides 124, HDL 48, LDL 135, hemoglobin 13.9, platelets 204, TSH 2.1  Other Studies Reviewed Today:  Echocardiogram 02/24/2022:  1. Left ventricular ejection fraction, by estimation, is 50 to 55%. The  left ventricle has low normal function. The left ventricle  demonstrates  global hypokinesis. Left ventricular diastolic parameters are consistent  with Grade I diastolic dysfunction  (impaired relaxation). Mildly reduced global longitudinal strain of  -15.5%.   2. Right ventricular systolic function is normal. The right ventricular  size is normal. There is mildly elevated pulmonary artery systolic  pressure. The estimated right ventricular systolic pressure is 36.2 mmHg.   3. The mitral valve is grossly normal. Trivial mitral valve  regurgitation.   4. The aortic valve is tricuspid. Aortic valve regurgitation is not  visualized. Aortic valve sclerosis/calcification is present, without any  evidence of aortic stenosis. Aortic valve mean gradient measures 8.0 mmHg.   5. Aortic dilatation noted. There is mild dilatation of the ascending  aorta, measuring 38 mm.   Assessment and Plan:    Medication Adjustments/Labs and Tests Ordered: Current medicines are reviewed at length with the patient today.  Concerns regarding medicines are  outlined above.   Tests Ordered: No orders of the defined types were placed in this encounter.   Medication Changes: No orders of the defined types were placed in this encounter.   Disposition:  Follow up {follow up:15908}  Signed, Jonelle Sidle, MD, [facility] 06/16/2022 9:43 AM    [facility] at [facility] [address] [place], [place], [place] [address] Phone: [phone]; Fax: [phone]

## 2022-07-13 ENCOUNTER — Other Ambulatory Visit: Payer: Self-pay | Admitting: *Deleted

## 2022-07-13 MED ORDER — LOSARTAN POTASSIUM 25 MG PO TABS: 12.5000 mg | ORAL_TABLET | Freq: Every day | ORAL | 3 refills | Status: DC

## 2022-07-16 ENCOUNTER — Other Ambulatory Visit: Payer: Self-pay | Admitting: Cardiology

## 2022-07-16 DIAGNOSIS — I4891 Unspecified atrial fibrillation: Secondary | ICD-10-CM

## 2022-07-16 NOTE — Telephone Encounter (Signed)
Prescription refill request for Eliquis received.  Indication: afib  Last office visit: Mcd[place], 03/04/2022 Scr: 0.91, 04/10/2021 Age: 81 yo  Weight: 85.5 kg   Pt is on the correct dose of Eliquis.   Called and spoke to pt who stated that he is almost out of Eliquis and needs a refill. Informed pt that he would need updated blood work for future refills. Pt stated that he would go to [facility] and get labs with in a week. Sent in a month supply of Eliquis so pt does not run out.

## 2022-07-19 ENCOUNTER — Other Ambulatory Visit: Payer: Self-pay | Admitting: *Deleted

## 2022-07-19 MED ORDER — ATORVASTATIN CALCIUM 80 MG PO TABS: 80.0000 mg | ORAL_TABLET | Freq: Every day | ORAL | 3 refills | Status: DC

## 2022-08-03 ENCOUNTER — Ambulatory Visit (INDEPENDENT_AMBULATORY_CARE_PROVIDER_SITE_OTHER): Payer: Medicare Other

## 2022-08-03 DIAGNOSIS — I480 Paroxysmal atrial fibrillation: Secondary | ICD-10-CM

## 2022-08-03 DIAGNOSIS — I428 Other cardiomyopathies: Secondary | ICD-10-CM | POA: Diagnosis not present

## 2022-08-03 LAB — CUP PACEART REMOTE DEVICE CHECK
Battery Remaining Longevity: 50 mo
Battery Remaining Percentage: 47 %
Battery Voltage: 2.98 V
Brady Statistic AP VP Percent: 1.6 %
Brady Statistic AP VS Percent: 93 %
Brady Statistic AS VP Percent: 1 %
Brady Statistic AS VS Percent: 5.2 %
Brady Statistic RA Percent Paced: 93 %
Brady Statistic RV Percent Paced: 2.5 %
Date Time Interrogation Session: 20230904020015
Implantable Lead Implant Date: 20001120
Implantable Lead Implant Date: 20001120
Implantable Lead Location: 753859
Implantable Lead Location: 753860
Implantable Pulse Generator Implant Date: 20171220
Lead Channel Impedance Value: 240 Ohm
Lead Channel Impedance Value: 430 Ohm
Lead Channel Pacing Threshold Amplitude: 0.625 V
Lead Channel Pacing Threshold Amplitude: 1 V
Lead Channel Pacing Threshold Pulse Width: 0.5 ms
Lead Channel Pacing Threshold Pulse Width: 0.5 ms
Lead Channel Sensing Intrinsic Amplitude: 5 mV
Lead Channel Sensing Intrinsic Amplitude: 8.4 mV
Lead Channel Setting Pacing Amplitude: 1.625
Lead Channel Setting Pacing Amplitude: 2.5 V
Lead Channel Setting Pacing Pulse Width: 0.5 ms
Lead Channel Setting Sensing Sensitivity: 2 mV
Pulse Gen Model: 2272
Pulse Gen Serial Number: 7983565

## 2022-08-05 ENCOUNTER — Other Ambulatory Visit: Payer: Self-pay | Admitting: *Deleted

## 2022-08-05 MED ORDER — FUROSEMIDE 40 MG PO TABS: 40.0000 mg | ORAL_TABLET | Freq: Every day | ORAL | 0 refills | Status: DC

## 2022-08-16 ENCOUNTER — Other Ambulatory Visit: Payer: Self-pay | Admitting: Cardiology

## 2022-08-16 NOTE — Telephone Encounter (Signed)
Prescription refill request for Eliquis received.   Indication: afib  Last office visit: Mcdowell, 03/04/2022 Scr: 1.140on 06/23/21 Age: 81 yo  Weight: 85.5 kg    Pt is on the correct dose of Eliquis.    Pt did not get labs as previously requested.  Reminded pt again of need for updated labs.  He verbalized understanding.  Lab orders already placed.

## 2022-08-19 ENCOUNTER — Ambulatory Visit: Payer: Medicare Other | Attending: Cardiology | Admitting: Cardiology

## 2022-08-19 ENCOUNTER — Encounter: Payer: Self-pay | Admitting: Cardiology

## 2022-08-19 VITALS — BP 116/78 | HR 65 | Ht 70.5 in | Wt 195.0 lb

## 2022-08-19 DIAGNOSIS — Z950 Presence of cardiac pacemaker: Secondary | ICD-10-CM

## 2022-08-19 DIAGNOSIS — I25119 Atherosclerotic heart disease of native coronary artery with unspecified angina pectoris: Secondary | ICD-10-CM

## 2022-08-19 DIAGNOSIS — I495 Sick sinus syndrome: Secondary | ICD-10-CM | POA: Diagnosis not present

## 2022-08-19 DIAGNOSIS — I480 Paroxysmal atrial fibrillation: Secondary | ICD-10-CM

## 2022-08-19 DIAGNOSIS — Z8679 Personal history of other diseases of the circulatory system: Secondary | ICD-10-CM | POA: Diagnosis not present

## 2022-08-19 MED ORDER — ELIQUIS 5 MG PO TABS: ORAL_TABLET | ORAL | 0 refills | Status: DC

## 2022-08-19 MED ORDER — FUROSEMIDE 20 MG PO TABS: 20.0000 mg | ORAL_TABLET | Freq: Every day | ORAL | 3 refills | Status: DC

## 2022-08-19 NOTE — Patient Instructions (Addendum)
Medication Instructions:  Your physician has recommended you make the following change in your medication:  Stop digoxin Decrease furosemide to 20 mg daily. (Break your 40 mg tablets in half daily) Continue other medications the same  Labwork: none  Testing/Procedures: none  Follow-Up: Your physician recommends that you schedule a follow-up appointment in: 6 months  Any Other Special Instructions Will Be Listed Below (If Applicable).  If you need a refill on your cardiac medications before your next appointment, please call your pharmacy.

## 2022-08-19 NOTE — Progress Notes (Signed)
Cardiology Office Note  Date: 08/19/2022   ID: [name], DOB 10/23/1941, MRN [id number]  PCP:  Ralph Leyden, FNP  Cardiologist:  Rozann Lesches, MD Electrophysiologist:  Virl Axe, MD   Chief Complaint  Patient presents with   Cardiac follow-up    History of Present Illness: [name] is a 81 y.o. male last seen in April.  He is here for a follow-up visit.  Reports no angina and NYHA class II dyspnea with low-level activity.  Still enjoys taking care of several outdoor cats at his house.  St. Jude dual-chamber pacemaker in place with follow-up in the device clinic.  Recent device check indicated normal function with low AF burden under 2%.  He does not report any significant palpitations or syncope.  I reviewed his recent lab work as noted below.  We went over his medications and discussed stopping Lanoxin as well as reduction in Lasix to 20 mg daily.  Echocardiogram from March showed LVEF 50 to 55% range.  Past Medical History:  Diagnosis Date   Acute ST elevation myocardial infarction (STEMI) of inferior wall [facility])    March 2022   Asthma    Bronchitis    CAD (coronary artery disease)    DES to RCA with staged DES to mid LAD and DES to proximal to mid circumflex March 2022   Cardiomyopathy [facility])    Possibly tachycardia mediated, also history of COVID-19 and CAD   History of epididymitis    History of recurrent UTIs    History of ruptured appendix    Paroxysmal atrial fibrillation ([place])    Peptic ulcer disease    Pneumonia due to COVID-19 virus    Presence of permanent cardiac pacemaker    Tachy-brady syndrome ([place])    PPM (SJM) implanted by Dr Caryl Comes 10/19/99, gen change 04/03/08    Past Surgical History:  Procedure Laterality Date   APPENDECTOMY     CARDIOVERSION [name] 02/05/2021   Procedure: CARDIOVERSION;  Surgeon: Satira Sark, MD;  Location: [facility];  Service: Cardiovascular;  Laterality: [name];   CORONARY STENT INTERVENTION [name] 02/16/2021    Procedure: CORONARY STENT INTERVENTION;  Surgeon: Leonie Man, MD;  Location: [place] CV LAB;  Service: Cardiovascular;  Laterality: [name];   CORONARY/GRAFT ACUTE MI REVASCULARIZATION [name] 02/13/2021   Procedure: Coronary/Graft Acute MI Revascularization;  Surgeon: Troy Sine, MD;  Location: [place] [facility] LAB;  Service: Cardiovascular;  Laterality: [name];   EP IMPLANTABLE DEVICE [name] 11/17/2016   Procedure: PPM Generator Changeout;  Surgeon: Evans Lance, MD;  Location: [place] CV LAB;  Service: Cardiovascular;  Laterality: [name];   INSERT / REPLACE / REMOVE PACEMAKER     LEFT HEART CATH [name] 02/16/2021   Procedure: Left Heart Cath;  Surgeon: Leonie Man, MD;  Location: [place] [facility] LAB;  Service: Cardiovascular;  Laterality: [name];   LEFT HEART CATH AND CORONARY ANGIOGRAPHY [name] 02/13/2021   Procedure: LEFT HEART CATH AND CORONARY ANGIOGRAPHY;  Surgeon: Troy Sine, MD;  Location: [place] CV LAB;  Service: Cardiovascular;  Laterality: [name];   PACEMAKER PLACEMENT  03/2008   St. Jude, [place]. [id number]    Current Outpatient Medications  Medication Sig Dispense Refill   amiodarone (PACERONE) 200 MG tablet Take 200 mg by mouth daily.     atorvastatin (LIPITOR) 80 MG tablet Take 1 tablet (80 mg total) by mouth daily. 90 tablet 3   furosemide (LASIX) 20 MG tablet Take 1 tablet (20 mg  total) by mouth daily. 90 tablet 3   losartan (COZAAR) 25 MG tablet Take 0.5 tablets (12.5 mg total) by mouth daily. 45 tablet 3   Menthol-Zinc Oxide (CALMOSEPTINE) 0.44-20.6 % OINT Apply topically. As needed to groin area.     nitroGLYCERIN (NITROSTAT) 0.4 MG SL tablet Place 1 tablet (0.4 mg total) under the tongue every 5 (five) minutes as needed. 25 tablet 2   pantoprazole (PROTONIX) 40 MG tablet Take 1 tablet (40 mg total) by mouth daily. 90 tablet 3   ELIQUIS 5 MG TABS tablet TAKE 1 TABLET BY MOUTH TWICE DAILY 28 tablet 0   No current facility-administered medications for this visit.    Allergies:  Bee venom, Corn-containing products, Citrullus vulgaris, Codeine, Invert sugar, Rice, and Vanilla   ROS: No syncope.  No orthopnea or PND.  Physical Exam: VS:  BP 116/78 (BP Location: Left Arm, Patient Position: Sitting, Cuff Size: Normal)   Pulse 65   Ht 5' 10.5" (1.791 m)   Wt 195 lb (88.5 kg)   SpO2 98%   BMI 27.58 kg/m , BMI Body mass index is 27.58 kg/m.  Wt Readings from Last 3 Encounters:  08/19/22 195 lb (88.5 kg)  03/04/22 188 lb 6.4 oz (85.5 kg)  12/03/21 180 lb 9.6 oz (81.9 kg)    General: Patient appears comfortable at rest. HEENT: Conjunctiva and lids normal. Neck: Supple, no elevated JVP or carotid bruits, no thyromegaly. Lungs: Clear to auscultation, nonlabored breathing at rest. Cardiac: Regular rate and rhythm, no S3, 2/6 systolic murmur. Extremities: No pitting edema.  ECG:  An ECG dated 12/03/2021 was personally reviewed today and demonstrated:  Atrial pacing with left bundle branch block.  Recent Labwork:    Component Value Date/Time   CHOL 112 02/13/2021 0425   TRIG 48 02/13/2021 0425   HDL 29 (L) 02/13/2021 0425   CHOLHDL 3.9 02/13/2021 0425   VLDL 10 02/13/2021 0425   LDLCALC 73 02/13/2021 0425   LDLDIRECT 147.9 08/30/2008 0842  July 2022: BUN 13, creatinine 1.14, potassium 4.2, AST 16, ALT 12, cholesterol 205, triglycerides 124, HDL 48, LDL 135, hemoglobin 13.9, platelets 204 September 2023: Hemoglobin 14.3, platelets 221, BUN 30, creatinine 1.36, potassium 3.9  Other Studies Reviewed Today:  Echocardiogram 02/24/2022:  1. Left ventricular ejection fraction, by estimation, is 50 to 55%. The  left ventricle has low normal function. The left ventricle demonstrates  global hypokinesis. Left ventricular diastolic parameters are consistent  with Grade I diastolic dysfunction  (impaired relaxation). Mildly reduced global longitudinal strain of  -15.5%.   2. Right ventricular systolic function is normal. The right ventricular  size is  normal. There is mildly elevated pulmonary artery systolic  pressure. The estimated right ventricular systolic pressure is 48.5 mmHg.   3. The mitral valve is grossly normal. Trivial mitral valve  regurgitation.   4. The aortic valve is tricuspid. Aortic valve regurgitation is not  visualized. Aortic valve sclerosis/calcification is present, without any  evidence of aortic stenosis. Aortic valve mean gradient measures 8.0 mmHg.   5. Aortic dilatation noted. There is mild dilatation of the ascending  aorta, measuring 38 mm.   Assessment and Plan:  1.  HFrecEF with mixed cardiomyopathy, LVEF 50 to 55% by echocardiogram earlier this year.  Clinically stable with NYHA class II symptoms at low-level activity.  Weight is up but he shows no peripheral edema, reports better appetite in the last 6 months.  I reviewed his recent lab work.  Plan to stop Lanoxin at  this point and reduce Lasix to 20 mg daily.  Continue losartan at current dose.  2.  Paroxysmal atrial fibrillation with CHA2DS2-VASc score of 3.  He remains on Eliquis for stroke prophylaxis.  No spontaneous bleeding problems.  Had low rhythm burden by most recent device interrogation.  Currently not on AV nodal blocker.  He remains on amiodarone.  3.  CAD status post inferior STEMI in March 2022 status post DES to the RCA with staged DES to the mid LAD and DES in the proximal to mid circumflex.  No longer on antiplatelet agent with concurrent use of Eliquis having completed prior course of Plavix.  No active angina symptoms.  Continue Lipitor, as needed nitroglycerin available.  4.  St. Jude pacemaker in place with history of tachycardia-bradycardia syndrome.  He continues to follow in the EP clinic.  Medication Adjustments/Labs and Tests Ordered: Current medicines are reviewed at length with the patient today.  Concerns regarding medicines are outlined above.   Tests Ordered: No orders of the defined types were placed in this  encounter.   Medication Changes: Meds ordered this encounter  Medications   furosemide (LASIX) 20 MG tablet    Sig: Take 1 tablet (20 mg total) by mouth daily.    Dispense:  90 tablet    Refill:  3    08/19/2022 dose decrease   ELIQUIS 5 MG TABS tablet    Sig: TAKE 1 TABLET BY MOUTH TWICE DAILY    Dispense:  28 tablet    Refill:  0    Lot# [id number] Exp 02/2024    Disposition:  Follow up  6 months.  Signed, Satira Sark, MD, [facility] 08/19/2022 2:56 PM    [facility] [place], [place], [place] [address] Phone: [phone]; Fax: [phone]

## 2022-08-27 NOTE — Progress Notes (Signed)
Remote pacemaker transmission.   

## 2022-09-21 ENCOUNTER — Other Ambulatory Visit: Payer: Self-pay | Admitting: Cardiology

## 2022-09-21 NOTE — Telephone Encounter (Signed)
Prescription refill request for Eliquis received. Indication: AF Last office visit: 08/19/22  Myles Gip MD Scr: 1.36 on 08/18/22 Age: 81 Weight: 88.5kg  Based on above findings Eliquis 5mg  twice daily is the appropriate dose.  Refill approved.

## 2022-11-02 ENCOUNTER — Ambulatory Visit (INDEPENDENT_AMBULATORY_CARE_PROVIDER_SITE_OTHER): Payer: Medicare Other

## 2022-11-02 DIAGNOSIS — I495 Sick sinus syndrome: Secondary | ICD-10-CM

## 2022-11-02 LAB — CUP PACEART REMOTE DEVICE CHECK
Battery Remaining Longevity: 47 mo
Battery Remaining Percentage: 45 %
Battery Voltage: 2.98 V
Brady Statistic AP VP Percent: 2 %
Brady Statistic AP VS Percent: 93 %
Brady Statistic AS VP Percent: 1 %
Brady Statistic AS VS Percent: 5.2 %
Brady Statistic RA Percent Paced: 92 %
Brady Statistic RV Percent Paced: 3.2 %
Date Time Interrogation Session: 20231205020018
Implantable Lead Connection Status: 753985
Implantable Lead Connection Status: 753985
Implantable Lead Implant Date: 20001120
Implantable Lead Implant Date: 20001120
Implantable Lead Location: 753859
Implantable Lead Location: 753860
Implantable Pulse Generator Implant Date: 20171220
Lead Channel Impedance Value: 240 Ohm
Lead Channel Impedance Value: 430 Ohm
Lead Channel Pacing Threshold Amplitude: 0.625 V
Lead Channel Pacing Threshold Amplitude: 1 V
Lead Channel Pacing Threshold Pulse Width: 0.5 ms
Lead Channel Pacing Threshold Pulse Width: 0.5 ms
Lead Channel Sensing Intrinsic Amplitude: 5 mV
Lead Channel Sensing Intrinsic Amplitude: 8.1 mV
Lead Channel Setting Pacing Amplitude: 1.625
Lead Channel Setting Pacing Amplitude: 2.5 V
Lead Channel Setting Pacing Pulse Width: 0.5 ms
Lead Channel Setting Sensing Sensitivity: 2 mV
Pulse Gen Model: 2272
Pulse Gen Serial Number: 7983565

## 2022-12-01 NOTE — Progress Notes (Signed)
Remote pacemaker transmission.   

## 2023-01-06 ENCOUNTER — Encounter (HOSPITAL_COMMUNITY): Payer: Self-pay | Admitting: *Deleted

## 2023-01-19 ENCOUNTER — Other Ambulatory Visit: Payer: Self-pay | Admitting: Cardiology

## 2023-01-19 MED ORDER — FUROSEMIDE 20 MG PO TABS: 20.0000 mg | ORAL_TABLET | Freq: Every day | ORAL | 0 refills | Status: DC

## 2023-02-01 ENCOUNTER — Ambulatory Visit (INDEPENDENT_AMBULATORY_CARE_PROVIDER_SITE_OTHER): Payer: Medicare Other

## 2023-02-01 DIAGNOSIS — I495 Sick sinus syndrome: Secondary | ICD-10-CM

## 2023-02-01 LAB — CUP PACEART REMOTE DEVICE CHECK
Battery Remaining Longevity: 44 mo
Battery Remaining Percentage: 42 %
Battery Voltage: 2.98 V
Brady Statistic AP VP Percent: 1.8 %
Brady Statistic AP VS Percent: 93 %
Brady Statistic AS VP Percent: 1 %
Brady Statistic AS VS Percent: 4.9 %
Brady Statistic RA Percent Paced: 91 %
Brady Statistic RV Percent Paced: 3.2 %
Date Time Interrogation Session: 20240305020016
Implantable Lead Connection Status: 753985
Implantable Lead Connection Status: 753985
Implantable Lead Implant Date: 20001120
Implantable Lead Implant Date: 20001120
Implantable Lead Location: 753859
Implantable Lead Location: 753860
Implantable Pulse Generator Implant Date: 20171220
Lead Channel Impedance Value: 240 Ohm
Lead Channel Impedance Value: 430 Ohm
Lead Channel Pacing Threshold Amplitude: 0.625 V
Lead Channel Pacing Threshold Amplitude: 1 V
Lead Channel Pacing Threshold Pulse Width: 0.5 ms
Lead Channel Pacing Threshold Pulse Width: 0.5 ms
Lead Channel Sensing Intrinsic Amplitude: 5 mV
Lead Channel Sensing Intrinsic Amplitude: 9.7 mV
Lead Channel Setting Pacing Amplitude: 1.625
Lead Channel Setting Pacing Amplitude: 2.5 V
Lead Channel Setting Pacing Pulse Width: 0.5 ms
Lead Channel Setting Sensing Sensitivity: 2 mV
Pulse Gen Model: 2272
Pulse Gen Serial Number: 7983565

## 2023-03-11 NOTE — Progress Notes (Signed)
Remote pacemaker transmission.   

## 2023-04-13 ENCOUNTER — Other Ambulatory Visit: Payer: Self-pay | Admitting: Cardiology

## 2023-04-13 DIAGNOSIS — I480 Paroxysmal atrial fibrillation: Secondary | ICD-10-CM

## 2023-04-13 NOTE — Telephone Encounter (Signed)
Prescription refill request for Eliquis received. Indication: Afib  Last office visit: 08/19/22 Diona Browner)  Scr: 1.43 (10/07/22)  Age: 82 Weight: 88.5kg  Appropriate dose. Refill sent.

## 2023-05-03 ENCOUNTER — Ambulatory Visit (INDEPENDENT_AMBULATORY_CARE_PROVIDER_SITE_OTHER): Payer: Medicare Other

## 2023-05-03 DIAGNOSIS — I495 Sick sinus syndrome: Secondary | ICD-10-CM | POA: Diagnosis not present

## 2023-05-03 LAB — CUP PACEART REMOTE DEVICE CHECK
Battery Remaining Longevity: 41 mo
Battery Remaining Percentage: 40 %
Battery Voltage: 2.96 V
Brady Statistic AP VP Percent: 1.7 %
Brady Statistic AP VS Percent: 94 %
Brady Statistic AS VP Percent: 1 %
Brady Statistic AS VS Percent: 4.6 %
Brady Statistic RA Percent Paced: 90 %
Brady Statistic RV Percent Paced: 3.6 %
Date Time Interrogation Session: 20240604020013
Implantable Lead Connection Status: 753985
Implantable Lead Connection Status: 753985
Implantable Lead Implant Date: 20001120
Implantable Lead Implant Date: 20001120
Implantable Lead Location: 753859
Implantable Lead Location: 753860
Implantable Pulse Generator Implant Date: 20171220
Lead Channel Impedance Value: 240 Ohm
Lead Channel Impedance Value: 430 Ohm
Lead Channel Pacing Threshold Amplitude: 0.625 V
Lead Channel Pacing Threshold Amplitude: 1 V
Lead Channel Pacing Threshold Pulse Width: 0.5 ms
Lead Channel Pacing Threshold Pulse Width: 0.5 ms
Lead Channel Sensing Intrinsic Amplitude: 5 mV
Lead Channel Sensing Intrinsic Amplitude: 7.2 mV
Lead Channel Setting Pacing Amplitude: 1.625
Lead Channel Setting Pacing Amplitude: 2.5 V
Lead Channel Setting Pacing Pulse Width: 0.5 ms
Lead Channel Setting Sensing Sensitivity: 2 mV
Pulse Gen Model: 2272
Pulse Gen Serial Number: 7983565

## 2023-05-30 ENCOUNTER — Encounter: Payer: Self-pay | Admitting: *Deleted

## 2023-05-30 NOTE — Progress Notes (Signed)
Remote pacemaker transmission.   

## 2023-05-31 ENCOUNTER — Encounter: Payer: Self-pay | Admitting: Cardiology

## 2023-05-31 ENCOUNTER — Ambulatory Visit: Payer: Medicare Other | Attending: Cardiology | Admitting: Cardiology

## 2023-05-31 VITALS — BP 124/64 | HR 60 | Ht 70.5 in | Wt 217.0 lb

## 2023-05-31 DIAGNOSIS — R0602 Shortness of breath: Secondary | ICD-10-CM

## 2023-05-31 DIAGNOSIS — Z8679 Personal history of other diseases of the circulatory system: Secondary | ICD-10-CM

## 2023-05-31 DIAGNOSIS — I480 Paroxysmal atrial fibrillation: Secondary | ICD-10-CM | POA: Diagnosis not present

## 2023-05-31 DIAGNOSIS — I495 Sick sinus syndrome: Secondary | ICD-10-CM

## 2023-05-31 DIAGNOSIS — I25119 Atherosclerotic heart disease of native coronary artery with unspecified angina pectoris: Secondary | ICD-10-CM | POA: Diagnosis not present

## 2023-05-31 DIAGNOSIS — Z950 Presence of cardiac pacemaker: Secondary | ICD-10-CM

## 2023-05-31 NOTE — Patient Instructions (Addendum)
Medication Instructions:  Your physician recommends that you continue on your current medications as directed. Please refer to the Current Medication list given to you today.  Labwork: none  Testing/Procedures: Your physician has requested that you have an echocardiogram. Echocardiography is a painless test that uses sound waves to create images of your heart. It provides your doctor with information about the size and shape of your heart and how well your heart's chambers and valves are working. This procedure takes approximately one hour. There are no restrictions for this procedure. Please do NOT wear cologne, perfume, aftershave, or lotions (deodorant is allowed). Please arrive 15 minutes prior to your appointment time.  Follow-Up: Your physician recommends that you schedule a follow-up appointment in: pending  Any Other Special Instructions Will Be Listed Below (If Applicable).  If you need a refill on your cardiac medications before your next appointment, please call your pharmacy. 

## 2023-05-31 NOTE — Progress Notes (Signed)
Cardiology Office Note  Date: 05/31/2023   ID: NAME SEESE, DOB [birth year], MRN [id number]  History of Present Illness: [name] is a 82 y.o. male last seen in September 2023.  He is here for a follow-up visit.  Record review finds recent ER visit at [facility] in late June for evaluation of chest discomfort, also reported chest congestion and cough.  He ruled out for ACS and was given a prescription for Levaquin with suspicion of possible pulmonary process.  Chest x-ray reported low lung volumes with perihilar opacities, left greater than right.  ECG showed rate controlled atrial fibrillation.  States that he feels run down, chronically short of breath.  He has also gained a significant amount of weight since last year, but he attributes this to diet and inactivity.  No reported orthopnea or PND.  St. Jude dual-chamber pacemaker in place with follow-up by Dr. Graciela Husbands.  Recent device check in June revealed normal function with evidence of PAF and controlled ventricular rates.  Last echocardiogram was in March 2023 as noted below, LVEF 50 to 55%.  We called his pharmacy to verify current medication list.  Physical Exam: VS:  BP 124/64   Pulse 60   Ht 5' 10.5" (1.791 m)   Wt 217 lb (98.4 kg)   SpO2 97%   BMI 30.70 kg/m , BMI Body mass index is 30.7 kg/m.  Wt Readings from Last 3 Encounters:  05/31/23 217 lb (98.4 kg)  08/19/22 195 lb (88.5 kg)  03/04/22 188 lb 6.4 oz (85.5 kg)    General: Patient appears comfortable at rest. HEENT: Conjunctiva and lids normal. Neck: Supple, no elevated JVP or carotid bruits. Lungs: Decreased breath sounds without wheezing. Cardiac: Distant regular, 2/6 systolic murmur. Abdomen: Protuberant, bowel sounds present. Extremities: Mild lower leg edema.  ECG:  An ECG dated 05/29/2023 was personally reviewed today and demonstrated:  Atrial fibrillation with left bundle branch block.  Labwork:  July 2023: Cholesterol 130,  triglycerides 100, HDL 50, LDL 61 June 2024: High-sensitivity troponin I 16 and 17, hemoglobin 14.2, platelets 208, potassium 4.1, BUN 21, creatinine 1.25, AST 28, ALT 31, magnesium 2.2  Cameron Studies Reviewed Today:  Echocardiogram 02/24/2022:  1. Left ventricular ejection fraction, by estimation, is 50 to 55%. The  left ventricle has low normal function. The left ventricle demonstrates  global hypokinesis. Left ventricular diastolic parameters are consistent  with Grade I diastolic dysfunction  (impaired relaxation). Mildly reduced global longitudinal strain of  -15.5%.   2. Right ventricular systolic function is normal. The right ventricular  size is normal. There is mildly elevated pulmonary artery systolic  pressure. The estimated right ventricular systolic pressure is 36.2 mmHg.   3. The mitral valve is grossly normal. Trivial mitral valve  regurgitation.   4. The aortic valve is tricuspid. Aortic valve regurgitation is not  visualized. Aortic valve sclerosis/calcification is present, without any  evidence of aortic stenosis. Aortic valve mean gradient measures 8.0 mmHg.   5. Aortic dilatation noted. There is mild dilatation of the ascending  aorta, measuring 38 mm.   Assessment and Plan:  1.  CAD status post DES to the RCA with staged DES to the mid LAD and DES to the proximal to mid circumflex in March 2022.  He does not report any definite angina at this time.  Recent ER evaluation did not suggest ACS based on high-sensitivity troponin I levels.  Currently not on aspirin given use of Eliquis.  He is on  Lipitor.  LDL 61 in July of last year.  2.  History of cardiomyopathy, possibly tachycardia induced.  LVEF 50 to 55% by echocardiogram in March 2023.  Plan to update echocardiogram to ensure no decrease in LVEF particular in light of recent symptoms.  He is on Cozaar and Lasix.  3.  Paroxysmal atrial fibrillation with CHA2DS2-VASc score of 3.  Continues on amiodarone for rhythm  suppression, still has breakthrough episodes.  Continue Eliquis for stroke prophylaxis.  4.  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 Graciela Husbands.  Disposition:  Follow up  echocardiogram and determine further disposition.  Signed, Jonelle Sidle, M.D., F.A.C.C. [facility]

## 2023-06-08 ENCOUNTER — Ambulatory Visit: Payer: Medicare Other | Attending: Cardiology

## 2023-06-08 DIAGNOSIS — Z8679 Personal history of other diseases of the circulatory system: Secondary | ICD-10-CM | POA: Diagnosis not present

## 2023-06-08 DIAGNOSIS — R0602 Shortness of breath: Secondary | ICD-10-CM

## 2023-06-10 LAB — ECHOCARDIOGRAM COMPLETE
AR max vel: 1.96 cm2
AV Area VTI: 2.05 cm2
AV Area mean vel: 2.2 cm2
AV Mean grad: 5 mmHg
AV Peak grad: 9.9 mmHg
Ao pk vel: 1.57 m/s
Area-P 1/2: 2.69 cm2
Calc EF: 34.8 %
Est EF: 40
MV VTI: 2.14 cm2
S' Lateral: 3.9 cm
Single Plane A2C EF: 30.7 %
Single Plane A4C EF: 37 %

## 2023-06-14 ENCOUNTER — Telehealth: Payer: Self-pay | Admitting: *Deleted

## 2023-06-14 DIAGNOSIS — Z79899 Other long term (current) drug therapy: Secondary | ICD-10-CM

## 2023-06-14 DIAGNOSIS — I5022 Chronic systolic (congestive) heart failure: Secondary | ICD-10-CM

## 2023-06-14 NOTE — Telephone Encounter (Signed)
Patient informed and verbalized understanding of plan. Lab order faxed to [facility] Lab Copy sent to PCP

## 2023-06-14 NOTE — Telephone Encounter (Signed)
-----   Message from [name] sent at 06/10/2023  4:11 PM EDT ----- Results reviewed.  Please let him know that heart function has decreased compared to last assessment, LVEF now approximately 40%.  This had been low normal at 50 to 55% by echocardiogram in 2023.  Currently on low-dose Cozaar and Lasix.  Suggest adding Aldactone 12.5 mg daily and check BMET in 10 days.  Would also have him follow-up in the next 4 to 6 weeks.

## 2023-06-14 NOTE — Telephone Encounter (Signed)
Patient informed that spironolactone will not be sent due to reaction to corn-containing products; therefore, lab work isn't needed. Verbalized understanding.

## 2023-07-15 ENCOUNTER — Encounter: Payer: Self-pay | Admitting: Nurse Practitioner

## 2023-07-15 ENCOUNTER — Ambulatory Visit: Payer: Medicare Other | Attending: Nurse Practitioner | Admitting: Nurse Practitioner

## 2023-07-15 VITALS — BP 122/60 | HR 70 | Ht 69.0 in | Wt 209.8 lb

## 2023-07-15 DIAGNOSIS — R2990 Unspecified symptoms and signs involving the nervous system: Secondary | ICD-10-CM | POA: Diagnosis not present

## 2023-07-15 DIAGNOSIS — Z950 Presence of cardiac pacemaker: Secondary | ICD-10-CM

## 2023-07-15 DIAGNOSIS — I495 Sick sinus syndrome: Secondary | ICD-10-CM

## 2023-07-15 DIAGNOSIS — R519 Headache, unspecified: Secondary | ICD-10-CM

## 2023-07-15 DIAGNOSIS — R42 Dizziness and giddiness: Secondary | ICD-10-CM | POA: Diagnosis not present

## 2023-07-15 DIAGNOSIS — H538 Other visual disturbances: Secondary | ICD-10-CM | POA: Diagnosis not present

## 2023-07-15 DIAGNOSIS — I480 Paroxysmal atrial fibrillation: Secondary | ICD-10-CM

## 2023-07-15 DIAGNOSIS — I428 Other cardiomyopathies: Secondary | ICD-10-CM

## 2023-07-15 DIAGNOSIS — I5020 Unspecified systolic (congestive) heart failure: Secondary | ICD-10-CM

## 2023-07-15 DIAGNOSIS — I2510 Atherosclerotic heart disease of native coronary artery without angina pectoris: Secondary | ICD-10-CM

## 2023-07-15 NOTE — Progress Notes (Addendum)
Cardiology Office Note:  .   Date:  07/15/2023  ID:  [name], DOB 1941-03-29, MRN [id number] PCP: Cameron Pop, FNP   HeartCare Providers Cardiologist:  [name], MD Electrophysiologist:  [name], MD    History of Present Illness: .   [name] is a 82 y.o. male with a PMH of CAD, s/p DES to RCA with staged DES to mid LAD and DES to proximal to mid Cx in March 2022, CM (possibly tachycardia mediated), PAF, tachybradycardia syndrome, s/p PPM, and CKD, who presents today for 4 to 6-week follow-up.   ER visit at [facility] 04/2023 for chest discomfort.  Ruled out for ACS, given Rx for Levaquin due to suspicion for possible pulmonary process.  CXR reported low lung volumes, perihilar opacities, L >R.  EKG revealed rate controlled A-fib.  Last seen by [name] on May 31, 2023.  Reported chronic shortness of breath, reported feeling run down.  Patient noted weight gain, attributed this to diet and activity.  Denied any PND orthopnea.  Echocardiogram was updated-full report noted below.  In the interim, visited ED at [facility] with dizziness.  EKG showed rate controlled A-fib with nonspecific intraventricular block, LVH.  Records reveal that labs/imaging were all unremarkable.  Felt better after being given meclizine.  Today he presents for office follow-up.  He states he is not feeling well. A few days ago, had an episode of what sounded like possible TIA, said he felt "tunneled visioned," had blurry vision and felt what he thought was "mini strokes," lasted for a few seconds at home. Says he has "felt off" intermittently for the last few months. Admits to headache, "something in my head feels like it doesn't belong," dizziness, and epigastric pain, says he has a poor appetite and food doesn't taste good to him. Denies any anginal chest pain, shortness of breath, palpitations, syncope, presyncope, orthopnea, PND, swelling or significant weight changes,  acute bleeding, or claudication. Denies any recent sick contacts of fever, chills, nausea, or vomiting.   Studies Reviewed: .    Echo 05/2023:  1. Left ventricular ejection fraction, by estimation, is 40%. The left  ventricle has mildly decreased function. Left ventricular endocardial  border not optimally defined to evaluate regional wall motion. There is  mild left ventricular hypertrophy. Left  ventricular diastolic parameters are consistent with Grade I diastolic  dysfunction (impaired relaxation).   2. Right ventricular systolic function is normal. The right ventricular  size is normal. Tricuspid regurgitation signal is inadequate for assessing  PA pressure.   3. The mitral valve is normal in structure. Trivial mitral valve  regurgitation. No evidence of mitral stenosis.   4. The aortic valve was not well visualized. Aortic valve regurgitation  is not visualized. No aortic stenosis is present.   Comparison(s): Prior images reviewed side by side. Changes from prior  study are noted. LVEF worsened from 50-55% in 2023 to 40% now.  Physical Exam:   VS:  BP 122/60   Pulse 70   Ht 5\' 9"  (1.753 m)   Wt 209 lb 12.8 oz (95.2 kg)   SpO2 96%   BMI 30.98 kg/m    Wt Readings from Last 3 Encounters:  07/15/23 209 lb 12.8 oz (95.2 kg)  05/31/23 217 lb (98.4 kg)  08/19/22 195 lb (88.5 kg)    GEN: Well nourished, well developed in no acute distress NECK: No JVD; No carotid bruits CARDIAC: S1/S2, RRR, no murmurs, rubs, gallops RESPIRATORY:  Clear to auscultation without rales, wheezing or rhonchi  ABDOMEN: Soft, non-tender, non-distended EXTREMITIES:  No edema; No deformity   ASSESSMENT AND PLAN: .    Stroke-like symptoms, Headache, Dizziness, Blurry vision (right eye), TIA? Etiology unclear. Dizzienss does sound like possible vertigo, however admits to episode a few days ago this week of possible ? TIA episode, pt states he was wondering if he was having mini strokes. Discussed  options for management and recommended ED evaluation at [facility] for further workup. He verbalized understanding and stated he wound benefit of a CT scan of his head to r/o stroke or TIA. Offered EMS transportation, however pt refuses and denies any current symptoms and states he feels well to drive himself. Safety precautions discussed. Report given to ED physician at [facility] (Dr. Jarold Motto), who verbalized understanding of report.  HFrEF, NICM Stage C, NYHA class I-II symptoms. EF reduced to 40% from 50-55%. Felt to be possible NICM in etiology. Euvolemic and well compensated on exam. Weight is down from last OV. Current BP does not allow titration of GDMT and his recent symptoms (see #1). Recommended ED evaluation - see #1. Low sodium diet, fluid restriction <2L, and daily weights encouraged. Educated to contact our office for weight gain of 2 lbs overnight or 5 lbs in one week.  3. CAD, s/p DES to RCA with staged DES to mid LAD and DES to proximal to mid Cx in March 2022 Stable with no anginal symptoms. No indication for ischemic evaluation. Admits to epigastric pain, admits to poor appetite - see below. Recommended he f/u with PCP after ED evaluation. No medication changes at this time.   4. PAF, tachybradycardia syndrome, s/p PPM Denies any tachycardia or palpitations. Remote device check 04/2023 revealed normal device function. No medication changes at this time. Denies any bleeding issues, on appropriate dosage of Eliquis. Recommended ED evaluation as mentioned above.   Dispo: Follow-up with me/[facility] in 3-4 weeks after ED evaluation or sooner if needed. Recommended pt also f/u with PCP for next available appt. He verbalized understanding.   Signed, Sharlene Dory, NP

## 2023-07-15 NOTE — Patient Instructions (Addendum)
Medication Instructions:   Continue all current medications.   Labwork:  none  Testing/Procedures:  none  Follow-Up:  3-4 weeks   Any Other Special Instructions Will Be Listed Below (If Applicable).  SENDING TO [facility] ED See pcp next available  If you need a refill on your cardiac medications before your next appointment, please call your pharmacy.'

## 2023-07-18 ENCOUNTER — Other Ambulatory Visit: Payer: Self-pay | Admitting: Cardiology

## 2023-07-20 ENCOUNTER — Ambulatory Visit: Payer: Medicare Other | Attending: Cardiology | Admitting: Cardiology

## 2023-07-20 ENCOUNTER — Telehealth: Payer: Self-pay | Admitting: Nurse Practitioner

## 2023-07-20 ENCOUNTER — Encounter: Payer: Self-pay | Admitting: Cardiology

## 2023-07-20 VITALS — BP 110/64 | HR 50 | Ht 70.0 in | Wt 209.0 lb

## 2023-07-20 DIAGNOSIS — I25119 Atherosclerotic heart disease of native coronary artery with unspecified angina pectoris: Secondary | ICD-10-CM

## 2023-07-20 DIAGNOSIS — I495 Sick sinus syndrome: Secondary | ICD-10-CM | POA: Diagnosis not present

## 2023-07-20 DIAGNOSIS — I951 Orthostatic hypotension: Secondary | ICD-10-CM | POA: Diagnosis not present

## 2023-07-20 DIAGNOSIS — I480 Paroxysmal atrial fibrillation: Secondary | ICD-10-CM

## 2023-07-20 DIAGNOSIS — I5022 Chronic systolic (congestive) heart failure: Secondary | ICD-10-CM | POA: Diagnosis not present

## 2023-07-20 NOTE — Progress Notes (Signed)
Cardiology Office Note  Date: 07/20/2023   ID: Cameron, Thomas 25-Aug-1941, MRN [id number]  History of Present Illness: [name] is a 82 y.o. male seen this past Friday, August 16 by Ms. Philis Nettle NP, I reviewed the note.  He walked into the [place] office today without an appointment asking to discuss continued symptoms.  He tells me that at least over the last month or so he has felt lightheadedness, an unusual feeling in his head, particularly worse when he stands up.  This is not associated with any chest pain necessarily or sense of palpitations, and he has had no frank syncope.  He was encouraged to be seen in the ER at the time of his last visit on August 16 for evaluation of potential TIA symptoms, but he did not seek further evaluation at that time.  Records indicate ER visit at [facility] on July 17 for evaluation of dizziness.  Head CT without contrast at that time demonstrated no acute intracranial abnormalities with mild chronic atrophy and small vessel ischemic changes similar to prior imaging.  He had no evidence of ACS and rate controlled atrial fibrillation.  He was prescribed meclizine.  Today in the office orthostatics were obtained and found to be abnormal.  He has tended to run a low normal blood pressure limiting titration of GDMT in general.  Supine blood pressure 118/74 with heart rate 48, seated blood pressure 88/68 with heart rate 52, standing blood pressure 90/60 with heart rate 54, and standing blood pressure after 3 minutes 88/60 with heart rate 50.  He felt lightheaded with this maneuver.  St. Jude dual-chamber pacemaker in place with follow-up by Dr. Graciela Husbands.   We went over his medications, he did not bring in the bottles today.  States that he recently ran out of Lipitor and Cozaar.  I reviewed his interval echocardiogram as noted below.  He ultimately did not start Aldactone.  Physical Exam: VS:  BP 110/64   Pulse (!) 50   Ht 5\' 10"  (1.778 m)    Wt 209 lb (94.8 kg)   SpO2 97%   BMI 29.99 kg/m , BMI Body mass index is 29.99 kg/m.  Wt Readings from Last 3 Encounters:  07/20/23 209 lb (94.8 kg)  07/15/23 209 lb 12.8 oz (95.2 kg)  05/31/23 217 lb (98.4 kg)    General: Patient is in no acute distress.  Ambulating with a cane. HEENT: Conjunctiva and lids normal. Neck: Supple, no elevated JVP or carotid bruits. Lungs: Clear to auscultation, nonlabored breathing at rest. Cardiac: Regular rate and rhythm, no S3, 1/6 systolic murmur. Extremities: Mild ankle edema. Neuropsychiatric: Alert and oriented x3, no dysarthria or focal motor weakness.  ECG:  An ECG dated 05/29/2023 was personally reviewed today and demonstrated:  Atrial fibrillation with left bundle branch block and subsequent ventricular pacing evident.  Labwork:  July 2024: Hemoglobin 14.5, platelets 178, potassium 4.6, BUN 26, creatinine 1.53, AST 20, ALT 26, pro-BNP 1597, high-sensitivity troponin I 13 and 16  Other Studies Reviewed Today:  Echocardiogram 06/10/2023:  1. Left ventricular ejection fraction, by estimation, is 40%. The left  ventricle has mildly decreased function. Left ventricular endocardial  border not optimally defined to evaluate regional wall motion. There is  mild left ventricular hypertrophy. Left  ventricular diastolic parameters are consistent with Grade I diastolic  dysfunction (impaired relaxation).   2. Right ventricular systolic function is normal. The right ventricular  size is normal. Tricuspid regurgitation signal is inadequate  for assessing  PA pressure.   3. The mitral valve is normal in structure. Trivial mitral valve  regurgitation. No evidence of mitral stenosis.   4. The aortic valve was not well visualized. Aortic valve regurgitation  is not visualized. No aortic stenosis is present.   Assessment and Plan:  1.  Lightheadedness as discussed above, mainly orthostatic in nature and not associated with palpitations or syncope.   Orthostatic measurements today were abnormal with low normal blood pressure at baseline.  He had a head CT done at [facility] ER last month with similar symptoms, no acute findings at that point.  Recommend stopping Cozaar completely.  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  In that case mechanical maneuvers are really our only choice other than maintaining an adequately hydrated status.  I think it would be difficult for him to put on lower extremity compression stockings and therefore we have recommended an abdominal binder.  2.  CAD status post DES to the RCA with staged DES to the mid LAD and DES to the proximal to mid circumflex in March 2022.  He does not report any accelerating angina.  Cardiac enzymes were reassuring during ER evaluation last month at [facility]  Not on aspirin given use of Eliquis.  Refill Lipitor, as needed nitroglycerin available.   3.  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  LVEF approximately 40% by follow-up echocardiogram in July.  LVEF has declined since prior assessment at this point GDMT is limited by orthostasis.  Stopping Cozaar at this time.  He does have Lasix available although not using consistently.  Starting midodrine to support blood pressure.   4.  Paroxysmal atrial fibrillation with CHA2DS2-VASc score of 3.  Continues on amiodarone for rhythm suppression, still has breakthrough episodes evident by pacemaker interrogation.  Continues on Eliquis for stroke prophylaxis.   5.  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 Graciela Husbands.  Disposition:  Follow up  1 month.  Signed, Jonelle Sidle, M.D., F.A.C.C.  HeartCare at [facility]

## 2023-07-20 NOTE — Patient Instructions (Signed)
Medication Instructions:   STOP Losartan  Labwork:  None today  Testing/Procedures: None today  Follow-Up: 1 month  Any Other Special Instructions Will Be Listed Below (If Applicable).   Purchase abdominal binder from [place] , no prescription required  If you need a refill on your cardiac medications before your next appointment, please call your pharmacy.

## 2023-07-20 NOTE — Telephone Encounter (Signed)
Spoke to patient who stated that the day he saw provider, he went to the ED but did not stay. Pt is requesting provider order Head CT to r/o stroke/TIA.   Please advise.

## 2023-07-20 NOTE — Telephone Encounter (Signed)
Pt is requesting a callback regarding CT he was supposed to have done at [facility] but couldn't make it. He'd like to discuss further with a nurse. Please advise

## 2023-08-02 ENCOUNTER — Ambulatory Visit (INDEPENDENT_AMBULATORY_CARE_PROVIDER_SITE_OTHER): Payer: Medicare Other

## 2023-08-02 DIAGNOSIS — I495 Sick sinus syndrome: Secondary | ICD-10-CM | POA: Diagnosis not present

## 2023-08-02 DIAGNOSIS — I480 Paroxysmal atrial fibrillation: Secondary | ICD-10-CM

## 2023-08-03 LAB — CUP PACEART REMOTE DEVICE CHECK
Battery Remaining Longevity: 38 mo
Battery Remaining Percentage: 37 %
Battery Voltage: 2.96 V
Brady Statistic AP VP Percent: 1.9 %
Brady Statistic AP VS Percent: 93 %
Brady Statistic AS VP Percent: 1 %
Brady Statistic AS VS Percent: 4.6 %
Brady Statistic RA Percent Paced: 87 %
Brady Statistic RV Percent Paced: 4.8 %
Date Time Interrogation Session: 20240903020013
Implantable Lead Connection Status: 753985
Implantable Lead Connection Status: 753985
Implantable Lead Implant Date: 20001120
Implantable Lead Implant Date: 20001120
Implantable Lead Location: 753859
Implantable Lead Location: 753860
Implantable Pulse Generator Implant Date: 20171220
Lead Channel Impedance Value: 240 Ohm
Lead Channel Impedance Value: 400 Ohm
Lead Channel Pacing Threshold Amplitude: 0.625 V
Lead Channel Pacing Threshold Amplitude: 1 V
Lead Channel Pacing Threshold Pulse Width: 0.5 ms
Lead Channel Pacing Threshold Pulse Width: 0.5 ms
Lead Channel Sensing Intrinsic Amplitude: 5 mV
Lead Channel Sensing Intrinsic Amplitude: 7.7 mV
Lead Channel Setting Pacing Amplitude: 1.625
Lead Channel Setting Pacing Amplitude: 2.5 V
Lead Channel Setting Pacing Pulse Width: 0.5 ms
Lead Channel Setting Sensing Sensitivity: 2 mV
Pulse Gen Model: 2272
Pulse Gen Serial Number: 7983565

## 2023-08-15 NOTE — Progress Notes (Signed)
Remote pacemaker transmission.   

## 2023-08-22 ENCOUNTER — Ambulatory Visit: Payer: Medicare Other | Admitting: Nurse Practitioner

## 2023-08-24 ENCOUNTER — Ambulatory Visit: Payer: Medicare Other | Admitting: Student

## 2023-08-26 ENCOUNTER — Telehealth: Payer: Self-pay | Admitting: Cardiology

## 2023-08-26 NOTE — Telephone Encounter (Signed)
EMS arrived, placed patient on stretcher and left office in route to ED.

## 2023-08-26 NOTE — Telephone Encounter (Signed)
Pt c/o Shortness Of Breath: STAT if SOB developed within the last 24 hours or pt is noticeably SOB on the phone  1. Are you currently SOB (can you hear that pt is SOB on the phone)? Some 2. How long have you been experiencing SOB? Off and on for a day 3. Are you SOB when sitting or when up moving around? Both at times  4.  Are you currently experiencing any other symptoms? Tightness in chest states that he feels pressure in his chest area.

## 2023-08-26 NOTE — Telephone Encounter (Addendum)
Reports while shopping at [facility], he felt pressure in his chest rated 1/10 that continues with dizziness & SOB. Patient did drive himself to office. Observed more SOB while walking to room. Vitals taken BP 116/80 HR 70 & 98%. Reports he saw PCP today before going to [facility] and did not have these symptoms. Discussed with provider and recommended ED evaluation with transportation by EMS. Patient informed and verbalized understanding of plan.   EMS contacted for transport to ED

## 2023-09-02 ENCOUNTER — Telehealth: Payer: Self-pay | Admitting: Cardiology

## 2023-09-02 DIAGNOSIS — I480 Paroxysmal atrial fibrillation: Secondary | ICD-10-CM

## 2023-09-02 MED ORDER — ELIQUIS 5 MG PO TABS: ORAL_TABLET | ORAL | 0 refills | Status: DC

## 2023-09-02 MED ORDER — ELIQUIS 5 MG PO TABS: ORAL_TABLET | ORAL | Status: DC

## 2023-09-02 NOTE — Telephone Encounter (Signed)
[facility] contacted to verify donut hole cost for eliquis 5 mg. Patient was paying $45/month and co-pay is now $132.76. Will send to Anti-Coagulation team to approve eliquis dose.

## 2023-09-02 NOTE — Telephone Encounter (Signed)
Eliquis samples are available for pick up along with BMS-PAF that she is to fill out all patient sections and bring back to office with proof of income and prescription out of pocket expense for 2024 and Denial letter from LIS program.

## 2023-09-02 NOTE — Telephone Encounter (Signed)
Pt last saw Dr Diona Browner 07/20/23, last labs 08/26/23 Creat 1.37, age 82, weight 94.8kg, based on specified criteria pt is on appropriate dosage of Eliquis 5mg  BID for afib.  OK to give samples.

## 2023-09-02 NOTE — Telephone Encounter (Signed)
Patient walked in and, said he went to the pharmacy to get refill on eliquis and, he said that the copay was really high they told him he was in the donut hole. He is here asking for samples

## 2023-09-05 NOTE — Telephone Encounter (Signed)
Patient informed and verbalized understanding of plan. 

## 2023-09-19 NOTE — Progress Notes (Unsigned)
Cardiology Office Note:    Date:  09/20/2023   ID:  [name], DOB 1941/09/03, MRN [id number]  PCP:  Golden Pop, FNP   [facility] Providers Cardiologist:  [name], MD Electrophysiologist:  [name], MD { Click to update primary MD,subspecialty MD or APP then REFRESH:1}    Referring MD: Golden Pop, FNP   No chief complaint on file. ***  History of Present Illness:    [name] is a 82 y.o. male with a hx of ***  Past Medical History:  Diagnosis Date   Acute ST elevation myocardial infarction (STEMI) of inferior wall [facility])    March 2022   Asthma    Bronchitis    CAD (coronary artery disease)    DES to RCA with staged DES to mid LAD and DES to proximal to mid circumflex March 2022   Cardiomyopathy [facility])    Possibly tachycardia mediated, also history of COVID-19 and CAD   History of epididymitis    History of recurrent UTIs    History of ruptured appendix    Paroxysmal atrial fibrillation (HCC)    Peptic ulcer disease    Pneumonia due to COVID-19 virus    Presence of permanent cardiac pacemaker    Tachy-brady syndrome (HCC)    PPM (SJM) implanted by Dr Graciela Husbands 10/19/99, gen change 04/03/08    Past Surgical History:  Procedure Laterality Date   APPENDECTOMY     CARDIOVERSION [name] 02/05/2021   Procedure: CARDIOVERSION;  Surgeon: Jonelle Sidle, MD;  Location: [facility] ORS;  Service: Cardiovascular;  Laterality: [name];   CORONARY STENT INTERVENTION [name] 02/16/2021   Procedure: CORONARY STENT INTERVENTION;  Surgeon: Marykay Lex, MD;  Location: [facility] INVASIVE CV LAB;  Service: Cardiovascular;  Laterality: [name];   CORONARY/GRAFT ACUTE MI REVASCULARIZATION [name] 02/13/2021   Procedure: Coronary/Graft Acute MI Revascularization;  Surgeon: Lennette Bihari, MD;  Location: [facility] INVASIVE CV LAB;  Service: Cardiovascular;  Laterality: [name];   EP IMPLANTABLE DEVICE [name] 11/17/2016   Procedure: PPM Generator Changeout;  Surgeon: Marinus Maw, MD;   Location: [facility] INVASIVE CV LAB;  Service: Cardiovascular;  Laterality: [name];   INSERT / REPLACE / REMOVE PACEMAKER     LEFT HEART CATH [name] 02/16/2021   Procedure: Left Heart Cath;  Surgeon: Marykay Lex, MD;  Location: [facility] INVASIVE CV LAB;  Service: Cardiovascular;  Laterality: [name];   LEFT HEART CATH AND CORONARY ANGIOGRAPHY [name] 02/13/2021   Procedure: LEFT HEART CATH AND CORONARY ANGIOGRAPHY;  Surgeon: Lennette Bihari, MD;  Location: [facility] INVASIVE CV LAB;  Service: Cardiovascular;  Laterality: [name];   PACEMAKER PLACEMENT  03/2008   St. Jude, [place]. [id number]    Current Medications: No outpatient medications have been marked as taking for the 09/21/23 encounter (Appointment) with [name], PA.     Allergies:   Bee venom, Corn-containing products, Citrullus vulgaris, Codeine, Invert sugar, Rice, and Vanilla   Social History   Socioeconomic History   Marital status: Divorced    Spouse name: Not on file   Number of children: Not on file   Years of education: Not on file   Highest education level: Not on file  Occupational History   Not on file  Tobacco Use   Smoking status: Former    Current packs/day: 0.00    Average packs/day: 1.5 packs/day for 20.0 years (30.0 ttl pk-yrs)    Types: Cigarettes    Start date: 08/20/1959    Quit date: 11/30/1977  Years since quitting: 45.8    Passive exposure: Never   Smokeless tobacco: Never  Vaping Use   Vaping status: Never Used  Substance and Sexual Activity   Alcohol use: No    Alcohol/week: 0.0 standard drinks of alcohol   Drug use: No   Sexual activity: Not Currently  Other Topics Concern   Not on file  Social History Narrative   Not on file   Social Determinants of Health   Financial Resource Strain: Low Risk  (09/06/2023)   Received from [facility]   Overall Financial Resource Strain (CARDIA)    Difficulty of Paying Living Expenses: Not hard at all  Food Insecurity: No Food Insecurity (09/06/2023)   Received from [facility]   Hunger Vital Sign    Worried About Running Out of Food in the Last Year: Never true    Ran Out of Food in the Last Year: Never true  Transportation Needs: No Transportation Needs (09/06/2023)   Received from [facility] - Transportation    Lack of Transportation (Medical): No    Lack of Transportation (Non-Medical): No  Physical Activity: Not on file  Stress: Stress Concern Present (04/02/2021)   Received from [facility], [facility] - Occupational Stress Questionnaire    Feeling of Stress : Very much  Social Connections: Unknown (04/03/2022)   Received from [facility]   Social Network    Social Network: Not on file     Family History: The patient's ***family history includes Cancer in his father; Suicidality in his mother.  ROS:   Please see the history of present illness.    *** All other systems reviewed and are negative.  EKGs/Labs/Other Studies Reviewed:    The following studies were reviewed today: ***      Recent Labs: No results found for requested labs within last 365 days.  Recent Lipid Panel    Component Value Date/Time   CHOL 112 02/13/2021 0425   TRIG 48 02/13/2021 0425   HDL 29 (L) 02/13/2021 0425   CHOLHDL 3.9 02/13/2021 0425   VLDL 10 02/13/2021 0425   LDLCALC 73 02/13/2021 0425   LDLDIRECT 147.9 08/30/2008 0842     Risk Assessment/Calculations:   {Does this patient have ATRIAL FIBRILLATION?[phone]}  No BP recorded.  {Refresh Note OR Click here to enter BP  :1}***         Physical Exam:    VS:  There were no vitals taken for this visit.    Wt Readings from Last 3 Encounters:  07/20/23 209 lb (94.8 kg)  07/15/23 209 lb 12.8 oz (95.2 kg)  05/31/23 217 lb (98.4 kg)     GEN: *** Well nourished, well developed in no acute distress HEENT: Normal NECK: No JVD; No carotid bruits LYMPHATICS: No lymphadenopathy CARDIAC: ***RRR, no murmurs, rubs,  gallops RESPIRATORY:  Clear to auscultation without rales, wheezing or rhonchi  ABDOMEN: Soft, non-tender, non-distended MUSCULOSKELETAL:  No edema; No deformity  SKIN: Warm and dry NEUROLOGIC:  Alert and oriented x 3 PSYCHIATRIC:  Normal affect   ASSESSMENT:    No diagnosis found. PLAN:    In order of problems listed above:  ***      {Are you ordering a CV Procedure (e.g. stress test, cath, DCCV, TEE, etc)?   Press F2        :[id number]}    Medication Adjustments/Labs and Tests Ordered: Current medicines are reviewed at  length with the patient today.  Concerns regarding medicines are outlined above.  No orders of the defined types were placed in this encounter.  No orders of the defined types were placed in this encounter.   There are no Patient Instructions on file for this visit.   Signed, [name], [place]  09/20/2023 11:48 AM    [facility]

## 2023-09-20 NOTE — Progress Notes (Deleted)
Cardiology Office Note    Date:  09/20/2023  ID:  [name], DOB Apr 04, 1941, MRN [id number] PCP:  Cameron Pop, FNP  Cardiologist:  Cameron Dell, MD  Electrophysiologist:  Cameron Manges, MD   Chief Complaint: Hospital follow-up for dizziness  History of Present Illness: .    [name] is a 82 y.o. 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   In 04/2023 he presented to [facility] for chest discomfort.  He was ruled out for ACS, given prescription for Levaquin due to suspicion for possible pulmonary process.  Chest x-ray reported low lung volumes, perihilar opacities, left greater than right.  EKG revealed rate controlled A-fib.  He was seen in follow-up by Dr. Diona Thomas on May 31, 2023 he reported chronic shortness of breath and feeling rundown.  echocardiogram on 06/08/2023 indicated LVEF 40%, LV with mildly decreased function, mild LVH, grade 1 diastolic dysfunction. Later in the month he presented to the ED at [facility] with increased dizziness EKG showed rate controlled A-fib with nonspecific intraventricular block, LVH, labs and imaging were all unremarkable patient reportedly felt better after being taking meclizine.  He was seen by [name] on 07/15/2023 for follow-up.  He reported that he had not been feeling well noted a few days prior to visit he had an episode of what sounded like possible TIA with "tunneled vision", blurry vision and what he felt were "mini strokes" he reported feeling off intermittently the few months prior.  Given strokelike symptoms is recommended that he present to the ED for further workup, on review patient does not appear to present to the ED.  On 07/20/2023 he presented without an appointment to the [facility] office to discuss continued  symptoms with Dr. Diona Thomas.  In office orthostatics were obtained and found to be abnormal.  It was recommended that he stop Cozaar completely.  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  It was felt that mechanical maneuvers were only choice for treatment of orthostasis side from maintaining and adequately hydrated status.  It was recommended that he wear an abdominal binder.  On 09/05/2023 he was admitted to [facility] for syncope and collapse.  On chart review patient was riding an electric cart in [place] tried to reach for something and passed out.  He can woke up with 3 of 4 people surrounding him.  He reported having near syncopal events nearly daily the past 2 years.  Echo 09/06/2023 indicated LVEF of 40 to 45%, grade 1 diastolic dysfunction, RV mildly dilated he was discharged on 10/9 with treatment for UTI and with recommendation for follow-up with cardiology.  On 09/19/2023 he presented to [facility] ED with intermittent dizziness, high-sensitivity troponin was negative, proBNP was elevated at 1011.     Dizziness:  CAD: s/p DES to hte RCA with staged DES to the mid LAD and DES to the proximal to mid circumflex in March 2022.  Angina? 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  Most recent echocardiogram on 06/08/2023 indicated LVEF 40%, LV with mildly decreased function, mild LVH, grade  1 diastolic dysfunction.  Dr. Diona Thomas recommended initiating Aldactone 12.5 mg daily, never started by patient.  At last office visit Cozaar was discontinued in setting of orthostasis. GDMT limited by orthostasis.  Labwork independently reviewed: 09/19/23: hemoglobin 14.3, hematocrit 44.1, D-dimer negative, sodium 141,  potassium 4.6, creatinine 1.37, albumin 3.4, AST 23, ALT 30, magnesium 2.1, TSH 1.136  ROS: .    Please see the history of present illness. Otherwise, review of systems is positive for ***.  All other systems are reviewed and otherwise negative.  Studies Reviewed: Marland Kitchen    EKG:  EKG is ordered today, personally reviewed, demonstrating ***  CV Studies: Cardiac studies reviewed are outlined and summarized above. Otherwise please see EMR for full report.   Current Reported Medications:.    No outpatient medications have been marked as taking for the 09/21/23 encounter (Appointment) with Cameron Duster, PA.    Physical Exam:    VS:  There were no vitals taken for this visit.   Wt Readings from Last 3 Encounters:  07/20/23 209 lb (94.8 kg)  07/15/23 209 lb 12.8 oz (95.2 kg)  05/31/23 217 lb (98.4 kg)    GEN: Well nourished, well developed in no acute distress NECK: No JVD; No carotid bruits CARDIAC: ***RRR, no murmurs, rubs, gallops RESPIRATORY:  Clear to auscultation without rales, wheezing or rhonchi  ABDOMEN: Soft, non-tender, non-distended EXTREMITIES:  No edema; No acute deformity   Asessement and Plan:.     ***     Disposition: F/u with ***  Signed, Cameron Harbour, Thomas

## 2023-09-21 ENCOUNTER — Ambulatory Visit: Payer: Medicare Other | Admitting: Physician Assistant

## 2023-09-22 ENCOUNTER — Encounter: Payer: Self-pay | Admitting: Nurse Practitioner

## 2023-09-22 ENCOUNTER — Ambulatory Visit: Payer: Medicare Other | Attending: Physician Assistant | Admitting: Nurse Practitioner

## 2023-09-22 VITALS — BP 118/70 | HR 60 | Ht 70.0 in | Wt 211.8 lb

## 2023-09-22 DIAGNOSIS — Z950 Presence of cardiac pacemaker: Secondary | ICD-10-CM

## 2023-09-22 DIAGNOSIS — I951 Orthostatic hypotension: Secondary | ICD-10-CM

## 2023-09-22 DIAGNOSIS — I2510 Atherosclerotic heart disease of native coronary artery without angina pectoris: Secondary | ICD-10-CM

## 2023-09-22 DIAGNOSIS — I5022 Chronic systolic (congestive) heart failure: Secondary | ICD-10-CM

## 2023-09-22 DIAGNOSIS — R42 Dizziness and giddiness: Secondary | ICD-10-CM | POA: Diagnosis not present

## 2023-09-22 DIAGNOSIS — I4891 Unspecified atrial fibrillation: Secondary | ICD-10-CM | POA: Diagnosis not present

## 2023-09-22 DIAGNOSIS — I428 Other cardiomyopathies: Secondary | ICD-10-CM

## 2023-09-22 DIAGNOSIS — Z87898 Personal history of other specified conditions: Secondary | ICD-10-CM

## 2023-09-22 DIAGNOSIS — I480 Paroxysmal atrial fibrillation: Secondary | ICD-10-CM

## 2023-09-22 MED ORDER — COMPRESSION BANDAGES KIT: 1.0000 | PACK | Freq: Every day | 0 refills | Status: AC

## 2023-09-22 MED ORDER — COMPRESSION BANDAGES KIT: 1.0000 | PACK | Freq: Every day | 0 refills | Status: DC

## 2023-09-22 NOTE — Progress Notes (Signed)
Cardiology Office Note:  .   Date:  09/22/2023 ID:  [name], DOB [birth year], MRN [id number] PCP: Golden Pop, FNP  [facility] Providers Cardiologist:  [name], MD Electrophysiologist:  [name], MD    History of Present Illness: .   [name] is a 82 y.o. 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   ER visit at [facility] 04/2023 for chest discomfort.  Ruled out for ACS, given Rx for Levaquin due to suspicion for possible pulmonary process.  CXR reported low lung volumes, perihilar opacities, L >R.  EKG revealed rate controlled A-fib.  Last seen by [name] on May 31, 2023.  Reported chronic shortness of breath, reported feeling run down.  Patient noted weight gain, attributed this to diet and activity.  Denied any PND orthopnea.  Echocardiogram was updated-full report noted below.  In the interim, visited ED at [facility] with dizziness.  EKG showed rate controlled A-fib with nonspecific intraventricular block, LVH.  Records reveal that labs/imaging were all unremarkable.  Felt better after being given meclizine.  Last saw [name] on 07/20/2023. Walked into [place] office without an appointment to discuss his continued symptoms from 07/15/2023 visit - see progress note. Orthostatics were obtained and were abnormal and were positive. [name] recommended to stop Losartan completely. 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  It was recommended to treat with mechanical maneuvers and staying well-hydrated, abdominal binder was recommended.  ED visit at [facility] on  08/20/2023 for dizziness, pt was found to be dehydrated.  Was encouraged to remain well-hydrated and wear his abdominal binder.  ED visit at [facility] for chest pain on 08/26/2023.  Noted chest pressure while shopping at [facility].  Patient described symptoms of UTI.  UA revealed UTI. R/O ACS.  Was treated for UTI.  Admission at [facility] 09/05/2023 for syncope and collapse.  Was at [facility] going through [employer] and electric cart and went to reach for something, then passed out.  Was in an electric cart because patient stated he cannot walk long distances anymore.  Patient cannot recall prodrome symptoms.  ZIO monitor was arranged.  Was prescribed treatment for UTI. Echo report noted below.  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 CT head was negative for anything acute. Was recommended to have close follow-up with cardiology.   ED visit at [facility] on September 19, 2023 for postural dizziness. CC of pre-syncope with a preceding aura.  CT head was negative for anything acute.  Treated with IV fluids.  Was sent home later in stable condition.  Today he presents for follow-up.  Denies any recurrent syncopal episodes since discharge.  Says he finished his treatment for his UTI.  Wore the ZIO monitor for 1 week from [facility].  Says he has not been wearing his abdominal binder.  He is now off BP medication. Denies any chest pain, shortness of breath, palpitations, orthopnea, PND, swelling or  significant weight changes, acute bleeding, or claudication.  Does admit to some dizziness at times.  Studies Reviewed: Marland Kitchen    EKG: EKG Interpretation Date/Time:  Thursday September 22 2023 10:38:51 EDT Ventricular Rate:  64 PR Interval:  194 QRS Duration:  170 QT Interval:  494 QTC  Calculation: 509 R Axis:   -85  Text Interpretation: AV dual-paced rhythm When compared with ECG of 17-Feb-2021 07:27, Electronic ventricular pacemaker has replaced Atrial fibrillation Vent. rate has decreased BY  48 BPM Confirmed by Sharlene Dory [phone]) on 09/22/2023 10:57:23 AM   Echo 08/2023:  Summary 1. Technically difficult study. 2. The left ventricle is normal in size with mildly increased wall thickness. 3. The left ventricular systolic function is moderately decreased, LVEF is visually estimated at 40-45%. 4. There is grade I diastolic dysfunction (impaired relaxation). 5. The mitral valve leaflets are mildly thickened with normal leaflet mobility. 6. There is mild mitral valve regurgitation. 7. The aortic valve is probably trileaflet with mildly thickened leaflets with normal excursion. 8. The right ventricle is mildly dilated in size, with normal systolic function. 9. The aorta at the ascending aorta is mildly dilated. Left Ventricle The left ventricle is normal in size with mildly increased wall thickness. The left ventricular systolic function is moderately decreased, LVEF is visually estimated at 40-45%. There is grade I diastolic dysfunction (impaired relaxation). Right Ventricle The right ventricle is mildly dilated in size, with normal systolic function. Additional right ventricle findings: there is a device lead present. Left Atrium The left atrium is normal in size. Right Atrium The right atrium is normal in size. There is a device lead present. Aortic Valve The aortic valve is probably trileaflet with mildly thickened leaflets with normal excursion. There is no significant aortic regurgitation. There is no evidence of a significant transvalvular gradient. Mitral Valve The mitral valve leaflets are mildly thickened with normal leaflet mobility. There is equivocal mitral valve prolapse. There is mild mitral valve regurgitation. Tricuspid Valve The tricuspid valve leaflets are normal, with  normal leaflet mobility. There is no significant tricuspid regurgitation. The pulmonary systolic pressure cannot be estimated due to insufficient TR signal. Pulmonic Valve Pulmonary valve is not well visualized. There is mild pulmonic regurgitation. There is no evidence of a significant transvalvular gradient. Aorta The aorta at the ascending aorta is mildly dilated. Inferior Vena Cava The IVC is suboptimally visualized but probably suggests normal right atrial pressure. Pericardium/Pleural There is no pericardial effusion.  Echo 05/2023:  1. Left ventricular ejection fraction, by estimation, is 40%. The left  ventricle has mildly decreased function. Left ventricular endocardial  border not optimally defined to evaluate regional wall motion. There is  mild left ventricular hypertrophy. Left  ventricular diastolic parameters are consistent with Grade I diastolic  dysfunction (impaired relaxation).   2. Right ventricular systolic function is normal. The right ventricular  size is normal. Tricuspid regurgitation signal is inadequate for assessing  PA pressure.   3. The mitral valve is normal in structure. Trivial mitral valve  regurgitation. No evidence of mitral stenosis.   4. The aortic valve was not well visualized. Aortic valve regurgitation  is not visualized. No aortic stenosis is present.   Comparison(s): Prior images reviewed side by side. Changes from prior  study are noted. LVEF worsened from 50-55% in 2023 to 40% now.  Physical Exam:   VS:  BP 118/70   Pulse 60   Ht 5\' 10"  (1.778 m)   Wt 211 lb 12.8 oz (96.1 kg)  SpO2 96%   BMI 30.39 kg/m    Wt Readings from Last 3 Encounters:  09/22/23 211 lb 12.8 oz (96.1 kg)  07/20/23 209 lb (94.8 kg)  07/15/23 209 lb 12.8 oz (95.2 kg)    GEN: Well nourished, well developed in no acute distress NECK: No JVD; No carotid bruits CARDIAC: S1/S2, RRR, no murmurs, rubs, gallops RESPIRATORY:  Clear to auscultation without rales, wheezing or  rhonchi  ABDOMEN: Soft, non-tender, non-distended EXTREMITIES:  No edema; No deformity   ASSESSMENT AND PLAN: .    Orthostatic hypotension, history of syncope and collapse, dizziness Patient denies any recurrent syncope, however continues to note some dizziness.  Per review of previous documentation due to his corn allergy, patient is not a candidate for midodrine, Florinef, or pyridostigmine.  He has not been wearing his abdominal binder.  Encouraged him to wear this during the daytime, may take this off at nighttime.  Will write Rx for compression stockings.  Recommended him to stay well-hydrated.  He verbalized understanding.  Care and ED precautions discussed.  Will request ZIO monitor results from [facility] to be faxed to our office.  HFmrEF, NICM Stage C, NYHA class I-II symptoms. EF 40-45% 08/2023. Felt to be possible NICM in etiology. Euvolemic and well compensated on exam. Weight stable.  He is not a candidate at this current moment to be on BP medication due to his past history of syncope and collapse and orthostatic hypotension-see above. Daily weights encouraged. Educated to contact our office for weight gain of 2 lbs overnight or 5 lbs in one week.  Care and ED precautions discussed.  3. CAD, s/p DES to RCA with staged DES to mid LAD and DES to proximal to mid Cx in March 2022 Stable with no anginal symptoms. No indication for ischemic evaluation. No medication changes at this time.  Heart healthy diet recommended.  Care and ED precautions discussed.  4. PAF, tachybradycardia syndrome, s/p PPM Denies any tachycardia or palpitations. Remote device check 07/2023 revealed normal device function.  Has not seen Dr. Graciela Husbands in the office since 2021.  Will provide referral to EP as he is due for office evaluation.  No medication changes at this time. Denies any bleeding issues, on appropriate dosage of Eliquis.  Care and ED precautions discussed.   Dispo: Follow-up with me/APP in 4-6 weeks or sooner  if anything changes.  Signed, Sharlene Dory, NP

## 2023-09-22 NOTE — Patient Instructions (Addendum)
Medication Instructions:  Your physician recommends that you continue on your current medications as directed. Please refer to the Current Medication list given to you today.  Labwork: None   Testing/Procedures: None   Follow-Up: Your physician recommends that you schedule a follow-up appointment in: 4-6 weeks   Any Other Special Instructions Will Be Listed Below (If Applicable).  If you need a refill on your cardiac medications before your next appointment, please call your pharmacy.

## 2023-10-05 ENCOUNTER — Encounter: Payer: Self-pay | Admitting: Internal Medicine

## 2023-10-07 ENCOUNTER — Encounter: Payer: Self-pay | Admitting: Cardiovascular Disease

## 2023-10-07 ENCOUNTER — Ambulatory Visit: Payer: Medicare Other | Attending: Cardiovascular Disease | Admitting: Cardiovascular Disease

## 2023-10-07 VITALS — BP 122/78 | HR 63 | Ht 70.0 in | Wt 213.6 lb

## 2023-10-07 DIAGNOSIS — I480 Paroxysmal atrial fibrillation: Secondary | ICD-10-CM | POA: Diagnosis not present

## 2023-10-07 NOTE — Progress Notes (Signed)
Electrophysiology Office Note:    Date:  10/07/2023   ID:  REILY SCHEUNEMAN, DOB 27-Sep-1941, MRN [id number]  PCP:  Ignatius Specking, MD   [facility] Providers Cardiologist:  [name], MD Electrophysiologist:  Maurice Small, MD     Referring MD: Golden Pop, FNP   History of Present Illness:    [name] is a 82 y.o. 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     He has an Abbott dual-chamber pacemaker placed in 2017 (gen change). The original device and leads were placed in 2000 (both tendril 1388) the current device was placed in October 2022.  He was hospitalized in 2021 with heart failure exacerbation, COVID-positive at the time.  He also had atrial fibrillation with RVR.  His EF was 25 to 30%.  He was started on GDMT.     Echocardiogram performed July 2024 showed a decrease in his ejection fraction from 50 to 55% in March 2023 to 40%.  His device interrogation today indicates that he has V paced 9%, however he is in complete heart block today and device dependent.  Additionally, a monitor was placed in October 2024 that appears to show a high burden of V pacing.  Several pause episodes were detected on the monitor as well.  His device interrogation today (11/89/24) shows an episode of ventricular lead noise with appropriate reversion to DOO.  His RV lead impedance has been chronically low at 240 ohms      Today, he reports that he is doing very well  EKGs/Labs/Other Studies Reviewed Today:     Echocardiogram:  TTE June 10, 2023 EF 40%.  Mild LV hypertrophy.   Monitors:  Zio monitor -7 days -my interpretation Tober 2024 Rhythm is V-paced    EKG:   EKG Interpretation Date/Time:  Friday October 07 2023 10:07:11 EST Ventricular Rate:  63 PR Interval:  198 QRS  Duration:  184 QT Interval:  500 QTC Calculation: 511 R Axis:   -85  Text Interpretation: AV dual-paced rhythm When compared with ECG of 22-Sep-2023 10:38, No significant change was found Confirmed by [place] Pellant [phone]) on 10/07/2023 11:02:11 AM     Physical Exam:    VS:  BP 122/78 (BP Location: Left Arm)   Pulse 63   Ht 5\' 10"  (1.778 m)   Wt 213 lb 9.6 oz (96.9 kg)   SpO2 98%   BMI 30.65 kg/m     Wt Readings from Last 3 Encounters:  10/07/23 213 lb 9.6 oz (96.9 kg)  09/22/23 211 lb 12.8 oz (96.1 kg)  07/20/23 209 lb (94.8 kg)     GEN:  Well nourished, well developed in no acute distress CARDIAC: RRR, no murmurs, rubs, gallops The device site is normal -- no tenderness, edema, drainage, redness, threatened erosion.  RESPIRATORY:  Normal work of breathing MUSCULOSKELETAL: no edema    ASSESSMENT & PLAN:     Atrial fibrillation Was on amiodarone in the past, discontinued in July 2021 for pulmonary toxicity concerns He tolerates rate control medications poorly due to hypotension  Secondary hypercoagulable state Continue Eliquis 5 mg twice daily  Cardiomyopathy EF has declined, correlating with increased and RV pacing burden, which appears to be now close to 95%. I think he would benefit from CRT  Dysautonomia --orthostatic hypotension  No changes today  Complete heart block, Abbott dual-chamber pacemaker I am concerned that he may have an insulation breach in his RV lead with noise reversion episodes and low impedance We are going to decrease the sensitivity today to avoid inhibition of pacing After discussion with my extracting colleagues, due to the age of the leads, we will not plan for lead extraction. If his vessel remains patent on venogram, we will plan for placement of a LBB lead   I discussed the indication for the procedure and the logistics, risks, potential benefit, and after care. I specifically explained that risks include but are not limited to  infection, bleeding,damage to blood vessels, lung, and the heart -- but risk of prolonged hospitalization, need for surgery, or the event of stroke, heart attack, or death are low but not zero.     Signed, Maurice Small, MD  10/07/2023 11:10 AM    [facility]

## 2023-10-07 NOTE — Patient Instructions (Addendum)
Medication Instructions:  Your physician recommends that you continue on your current medications as directed. Please refer to the Current Medication list given to you today.   Labwork: none  Testing/Procedures: none  Follow-Up:  Your physician recommends that you schedule a follow-up appointment in: 1 month  Any Other Special Instructions Will Be Listed Below (If Applicable).  If you need a refill on your cardiac medications before your next appointment, please call your pharmacy.  

## 2023-10-11 ENCOUNTER — Telehealth: Payer: Self-pay | Admitting: Cardiology

## 2023-10-11 DIAGNOSIS — I480 Paroxysmal atrial fibrillation: Secondary | ICD-10-CM

## 2023-10-11 MED ORDER — ELIQUIS 5 MG PO TABS: ORAL_TABLET | ORAL | 0 refills | Status: DC

## 2023-10-11 NOTE — Telephone Encounter (Signed)
1 box of Eliquis given.  Per notes, patient was given patient assistance info in October the last time he picked up samples, but states he never got any papers.  Low income subsidy info & patient assistance info given & reviewed with patient.

## 2023-10-11 NOTE — Telephone Encounter (Signed)
Sample request for Eliquis received. Indication: AF Last office visit: 09/22/23  Shawnie Dapper NP Scr: 1.37 on 09/19/23  Epic Age: 82 Weight: 96.1kg  Based on above findings Eliquis 5mg  twice daily is the appropriate dose.  OK to provide samples if available.

## 2023-10-11 NOTE — Telephone Encounter (Signed)
Pt is needing samples of Eliquis- he's waiting in the lobby

## 2023-10-17 ENCOUNTER — Telehealth: Payer: Self-pay

## 2023-10-17 DIAGNOSIS — I480 Paroxysmal atrial fibrillation: Secondary | ICD-10-CM

## 2023-10-17 NOTE — Telephone Encounter (Signed)
Spoke with pt and went over instructions for PPM Gen Change and RV Lead placement. He is scheduled on 12/3 at 2:30 pm. He will go to [facility] to have labs done. He is aware to hold Eliquis x 2 days.   Pt has 3 years left on his PPM battery so I verified with Dr. Nelly Laurence and he confirmed that he will replace it at this time so he doesn't have to re-open the patient in 3 years to do so.

## 2023-10-26 ENCOUNTER — Other Ambulatory Visit (HOSPITAL_COMMUNITY)
Admission: RE | Admit: 2023-10-26 | Discharge: 2023-10-26 | Disposition: A | Discharge status: Home / Self Care | Payer: Medicare Other | Source: Ambulatory Visit | Attending: Cardiovascular Disease | Admitting: Cardiovascular Disease

## 2023-10-26 DIAGNOSIS — I480 Paroxysmal atrial fibrillation: Secondary | ICD-10-CM | POA: Insufficient documentation

## 2023-10-26 LAB — CBC
HCT: 43.6 % (ref 39.0–52.0)
Hemoglobin: 13.7 g/dL (ref 13.0–17.0)
MCH: 26.6 pg (ref 26.0–34.0)
MCHC: 31.4 g/dL (ref 30.0–36.0)
MCV: 84.7 fL (ref 80.0–100.0)
Platelets: 237 10*3/uL (ref 150–400)
RBC: 5.15 MIL/uL (ref 4.22–5.81)
RDW: 15.3 % (ref 11.5–15.5)
WBC: 7.7 10*3/uL (ref 4.0–10.5)
nRBC: 0 % (ref 0.0–0.2)

## 2023-10-26 LAB — BASIC METABOLIC PANEL WITH GFR
Anion gap: 7 (ref 5–15)
BUN: 20 mg/dL (ref 8–23)
CO2: 22 mmol/L (ref 22–32)
Calcium: 9.3 mg/dL (ref 8.9–10.3)
Chloride: 110 mmol/L (ref 98–111)
Creatinine, Ser: 0.82 mg/dL (ref 0.61–1.24)
GFR, Estimated: >60 mL/min
Glucose, Bld: 98 mg/dL (ref 70–99)
Potassium: 4.8 mmol/L (ref 3.5–5.1)
Sodium: 139 mmol/L (ref 135–145)

## 2023-10-28 LAB — CUP PACEART REMOTE DEVICE CHECK
Battery Remaining Longevity: 35 mo
Battery Remaining Percentage: 34 %
Battery Voltage: 2.95 V
Brady Statistic AP VP Percent: 96 %
Brady Statistic AP VS Percent: 1 %
Brady Statistic AS VP Percent: 4 %
Brady Statistic AS VS Percent: 1 %
Brady Statistic RA Percent Paced: 64 %
Brady Statistic RV Percent Paced: 99 %
Date Time Interrogation Session: 20241129040014
Implantable Lead Connection Status: 753985
Implantable Lead Connection Status: 753985
Implantable Lead Implant Date: 20001120
Implantable Lead Implant Date: 20001120
Implantable Lead Location: 753859
Implantable Lead Location: 753860
Implantable Pulse Generator Implant Date: 20171220
Lead Channel Impedance Value: 240 Ohm
Lead Channel Impedance Value: 430 Ohm
Lead Channel Pacing Threshold Amplitude: 0.625 V
Lead Channel Pacing Threshold Amplitude: 1.25 V
Lead Channel Pacing Threshold Pulse Width: 0.5 ms
Lead Channel Pacing Threshold Pulse Width: 0.5 ms
Lead Channel Sensing Intrinsic Amplitude: 4.6 mV
Lead Channel Sensing Intrinsic Amplitude: 5 mV
Lead Channel Setting Pacing Amplitude: 1.5 V
Lead Channel Setting Pacing Amplitude: 1.625
Lead Channel Setting Pacing Pulse Width: 0.5 ms
Lead Channel Setting Sensing Sensitivity: 12.5 mV
Pulse Gen Model: 2272
Pulse Gen Serial Number: 7983565

## 2023-10-31 NOTE — Telephone Encounter (Signed)
Would you please give [name] a call- he came into the [place] office stating he thought he cancelled his procedure for tomorrow. He's not sure who he spoke with.   He also states that he took an Eliquis this morning and that he has a chest cold.    Please call [phone]

## 2023-10-31 NOTE — Telephone Encounter (Signed)
I called and spoke with pt. He states that he called on Friday and spoke to someone and they were going to relay the message to [place] that he was sick and may need to cancel. We didn't get a message from anyone.  He sounds terrible, coughing and says he feels bad. He forgot to hold his Eliquis yesterday and was supposed to hold it x 2 days.   I messaged Dr. Nelly Laurence and he suggested we reschedule the pt since he is coughing frequently AND forgot to hold his Eliquis.   I tried to rescheduled the pt while I was on the phone but he didn't feel good so he ask if I could call him back in a week to get him rescheduled.   I suggested he call his PCP and be seen for his cough.

## 2023-11-01 ENCOUNTER — Encounter (HOSPITAL_COMMUNITY): Admission: RE | Payer: Self-pay | Source: Home / Self Care

## 2023-11-01 ENCOUNTER — Ambulatory Visit: Payer: Medicare Other | Admitting: Nurse Practitioner

## 2023-11-01 ENCOUNTER — Ambulatory Visit (INDEPENDENT_AMBULATORY_CARE_PROVIDER_SITE_OTHER): Payer: Medicare Other

## 2023-11-01 ENCOUNTER — Ambulatory Visit (HOSPITAL_COMMUNITY): Admission: RE | Admit: 2023-11-01 | Payer: Medicare Other | Source: Home / Self Care | Admitting: Cardiovascular Disease

## 2023-11-01 DIAGNOSIS — I428 Other cardiomyopathies: Secondary | ICD-10-CM | POA: Diagnosis not present

## 2023-11-01 SURGERY — PPM GENERATOR CHANGEOUT

## 2023-11-02 LAB — CUP PACEART REMOTE DEVICE CHECK
Battery Remaining Longevity: 35 mo
Battery Remaining Percentage: 34 %
Battery Voltage: 2.95 V
Brady Statistic AP VP Percent: 96 %
Brady Statistic AP VS Percent: 1 %
Brady Statistic AS VP Percent: 4.4 %
Brady Statistic AS VS Percent: 1 %
Brady Statistic RA Percent Paced: 59 %
Brady Statistic RV Percent Paced: 99 %
Date Time Interrogation Session: 20241203020016
Implantable Lead Connection Status: 753985
Implantable Lead Connection Status: 753985
Implantable Lead Implant Date: 20001120
Implantable Lead Implant Date: 20001120
Implantable Lead Location: 753859
Implantable Lead Location: 753860
Implantable Pulse Generator Implant Date: 20171220
Lead Channel Impedance Value: 240 Ohm
Lead Channel Impedance Value: 400 Ohm
Lead Channel Pacing Threshold Amplitude: 0.625 V
Lead Channel Pacing Threshold Amplitude: 1.125 V
Lead Channel Pacing Threshold Pulse Width: 0.5 ms
Lead Channel Pacing Threshold Pulse Width: 0.5 ms
Lead Channel Sensing Intrinsic Amplitude: 4.6 mV
Lead Channel Sensing Intrinsic Amplitude: 5 mV
Lead Channel Setting Pacing Amplitude: 1.375
Lead Channel Setting Pacing Amplitude: 1.625
Lead Channel Setting Pacing Pulse Width: 0.5 ms
Lead Channel Setting Sensing Sensitivity: 12.5 mV
Pulse Gen Model: 2272
Pulse Gen Serial Number: 7983565

## 2023-11-04 ENCOUNTER — Ambulatory Visit: Payer: Medicare Other | Attending: Cardiovascular Disease | Admitting: Cardiovascular Disease

## 2023-11-04 VITALS — BP 108/78 | HR 68 | Ht 70.0 in | Wt 214.2 lb

## 2023-11-04 DIAGNOSIS — I5022 Chronic systolic (congestive) heart failure: Secondary | ICD-10-CM

## 2023-11-04 NOTE — Patient Instructions (Signed)
Medication Instructions:  Continue all current medications.   Labwork: none  Testing/Procedures: none  Follow-Up: 6 months   Any Other Special Instructions Will Be Listed Below (If Applicable).   If you need a refill on your cardiac medications before your next appointment, please call your pharmacy.  

## 2023-11-04 NOTE — Progress Notes (Signed)
Electrophysiology Office Note:    Date:  11/04/2023   ID:  JAI TINKHAM, DOB March 12, 1941, MRN [id number]  PCP:  Ignatius Specking, MD   [facility] Providers Cardiologist:  [name], MD Electrophysiologist:  Maurice Small, MD     Referring MD: Ignatius Specking, MD   History of Present Illness:    [name] is a 82 y.o. 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     He has an Abbott dual-chamber pacemaker placed in 2017 (gen change). The original device and leads were placed in 2000 (both tendril 1388) the current device was placed in October 2022.  He was hospitalized in 2021 with heart failure exacerbation, COVID-positive at the time.  He also had atrial fibrillation with RVR.  His EF was 25 to 30%.  He was started on GDMT.     Echocardiogram performed July 2024 showed a decrease in his ejection fraction from 50 to 55% in March 2023 to 40%.  His device interrogation today indicates that he has V paced 9%, however he is in complete heart block today and device dependent.  Additionally, a monitor was placed in October 2024 that appears to show a high burden of V pacing.  Several pause episodes were detected on the monitor as well.  His device interrogation today (11/89/24) shows an episode of ventricular lead noise with appropriate reversion to DOO.  His RV lead impedance has been chronically low at 240 ohms      He was scheduled for RV lead revision but this was cancelled because he has an acute respiratory infection. Today, he feels sick. He has ongoing congestion, cough, drainage.   EKGs/Labs/Other Studies Reviewed Today:     Echocardiogram:  TTE June 10, 2023 EF 40%.  Mild LV hypertrophy.   Monitors:  Zio monitor -7 days -my interpretation Tober 2024 Rhythm is V-paced    EKG:    EKG Interpretation Date/Time:  Friday November 04 2023 13:13:01 EST Ventricular Rate:  62 PR Interval:  176 QRS Duration:  174 QT Interval:  508 QTC Calculation: 515 R Axis:   -83  Text Interpretation: AV dual-paced rhythm When compared with ECG of 07-Oct-2023 10:07, No significant change was found Confirmed by [place] Pellant [phone]) on 11/04/2023 1:37:24 PM     Physical Exam:    VS:  BP 108/78   Pulse 68   Ht 5\' 10"  (1.778 m)   Wt 214 lb 3.2 oz (97.2 kg)   SpO2 98%   BMI 30.73 kg/m     Wt Readings from Last 3 Encounters:  11/04/23 214 lb 3.2 oz (97.2 kg)  10/07/23 213 lb 9.6 oz (96.9 kg)  09/22/23 211 lb 12.8 oz (96.1 kg)     GEN:  Well nourished, well developed in no acute distress CARDIAC: RRR, no murmurs, rubs, gallops The device site is normal -- no tenderness, edema, drainage, redness, threatened erosion.  RESPIRATORY:  Normal work of breathing MUSCULOSKELETAL: no edema    ASSESSMENT & PLAN:    Respiratory infection He will follow-up with his PCP Will reschedule lead revision once he recovers from his acute illness  Atrial fibrillation Was on amiodarone in the past, discontinued in July 2021 for pulmonary toxicity concerns He tolerates rate control medications poorly due to hypotension  Secondary hypercoagulable state Continue Eliquis 5 mg  twice daily  Cardiomyopathy EF has declined, correlating with increased and RV pacing burden, which appears to be now close to 95%. I think he would benefit from CRT  Dysautonomia --orthostatic hypotension  No changes today  Complete heart block, Abbott dual-chamber pacemaker I am concerned that he may have an insulation breach in his RV lead with noise reversion episodes and low impedance We are going to decrease the sensitivity today to avoid inhibition of pacing After discussion with my extracting colleagues, due to the age of the leads, we will not plan for lead extraction. If his vessel remains patent on  venogram, we will plan for placement of a LBB lead   I discussed the indication for the procedure and the logistics, risks, potential benefit, and after care. I specifically explained that risks include but are not limited to infection, bleeding,damage to blood vessels, lung, and the heart -- but risk of prolonged hospitalization, need for surgery, or the event of stroke, heart attack, or death are low but not zero.     Signed, Maurice Small, MD  11/04/2023 1:39 PM    [facility]

## 2023-11-09 LAB — CUP PACEART INCLINIC DEVICE CHECK
Date Time Interrogation Session: 20241206151504
Implantable Lead Connection Status: 753985
Implantable Lead Connection Status: 753985
Implantable Lead Implant Date: 20001120
Implantable Lead Implant Date: 20001120
Implantable Lead Location: 753859
Implantable Lead Location: 753860
Implantable Pulse Generator Implant Date: 20171220
Pulse Gen Model: 2272
Pulse Gen Serial Number: 7983565

## 2023-11-14 ENCOUNTER — Other Ambulatory Visit: Payer: Self-pay | Admitting: Cardiology

## 2023-11-14 DIAGNOSIS — I480 Paroxysmal atrial fibrillation: Secondary | ICD-10-CM

## 2023-11-17 ENCOUNTER — Other Ambulatory Visit: Payer: Self-pay | Admitting: *Deleted

## 2023-11-17 DIAGNOSIS — I480 Paroxysmal atrial fibrillation: Secondary | ICD-10-CM

## 2023-11-17 MED ORDER — ELIQUIS 5 MG PO TABS: ORAL_TABLET | ORAL | 5 refills | Status: DC

## 2023-11-17 NOTE — Telephone Encounter (Signed)
Prescription refill request for Eliquis received. Indication: AF Last office visit: 11/04/23  A Mealor MD Scr: 0.82 on 10/26/23  Epic Age: 82 Weight: 97.2kg  Based on above findings Eliquis 5mg  twice daily is the appropriate dose.  Refill approved.

## 2023-12-02 NOTE — Telephone Encounter (Signed)
 I called pt and spoke with him about rescheduling his PPM Gen change and RV lead placement. He stated that he still doesn't feel well. He has a bad cough and a UTI and doesn't want to schedule anything at this time.   He said he would call us  back when he is ready to proceed with scheduling this.

## 2023-12-02 NOTE — Telephone Encounter (Signed)
 Spoke with patient, currently being treated with antibiotics from PCP for a UTI and a chest cold per patient. Patient states he has severe congestion in sinuses and chest but has started to be a productive cough. UTI has not improved but patient is following up with PCP. Patient not ready to reschedule procedure at this time, will contact us  after follow up with PCP and antibiotics are finished.

## 2023-12-06 ENCOUNTER — Telehealth: Payer: Self-pay | Admitting: Cardiovascular Disease

## 2023-12-06 DIAGNOSIS — I428 Other cardiomyopathies: Secondary | ICD-10-CM

## 2023-12-06 NOTE — Telephone Encounter (Signed)
 Pt walked in to [place] office and wants to reschedule GEN Change

## 2023-12-09 ENCOUNTER — Encounter: Payer: Medicare Other | Admitting: Cardiovascular Disease

## 2023-12-09 NOTE — Telephone Encounter (Signed)
 I called pt to get him rescheduled for his PPM gen change and RV lead placement. He says he needs to be home by dark and everything I had to offer him was afternoon appts.  I will have Dr. Marko RN look over his schedule with me and help pick a date and call him back next week.

## 2023-12-14 NOTE — Addendum Note (Signed)
 Addended by: Analleli Gierke on: 12/14/2023 10:31 AM   Modules accepted: Orders

## 2023-12-14 NOTE — Telephone Encounter (Signed)
 Pt is scheduled for PPM Gen Change with RV lead placement with Dr. Arlester Ladd at 1:30 pm. He was told to arrive at 11:00 am.   He will go to [facility] for labs on 11/16-17. He is aware to hold Eliquis  for 2 days with last dose being on Sunday 1/19.

## 2023-12-16 ENCOUNTER — Other Ambulatory Visit (HOSPITAL_COMMUNITY)
Admission: RE | Admit: 2023-12-16 | Discharge: 2023-12-16 | Disposition: A | Discharge status: Home / Self Care | Payer: Medicare Other | Source: Ambulatory Visit | Attending: Cardiovascular Disease | Admitting: Cardiovascular Disease

## 2023-12-16 ENCOUNTER — Telehealth: Payer: Self-pay

## 2023-12-16 ENCOUNTER — Other Ambulatory Visit: Payer: Self-pay

## 2023-12-16 DIAGNOSIS — I480 Paroxysmal atrial fibrillation: Secondary | ICD-10-CM | POA: Insufficient documentation

## 2023-12-16 DIAGNOSIS — I428 Other cardiomyopathies: Secondary | ICD-10-CM

## 2023-12-16 DIAGNOSIS — I5022 Chronic systolic (congestive) heart failure: Secondary | ICD-10-CM

## 2023-12-16 LAB — BASIC METABOLIC PANEL
Anion gap: 6 (ref 5–15)
BUN: 23 mg/dL (ref 8–23)
CO2: 20 mmol/L — ABNORMAL LOW (ref 22–32)
Calcium: 9.1 mg/dL (ref 8.9–10.3)
Chloride: 111 mmol/L (ref 98–111)
Creatinine, Ser: 1.1 mg/dL (ref 0.61–1.24)
GFR, Estimated: >60 mL/min (ref >60)
Glucose, Bld: 119 mg/dL — ABNORMAL HIGH (ref 70–99)
Potassium: 4.4 mmol/L (ref 3.5–5.1)
Sodium: 137 mmol/L (ref 135–145)

## 2023-12-16 LAB — CBC
HCT: 43.1 % (ref 39.0–52.0)
Hemoglobin: 13.7 g/dL (ref 13.0–17.0)
MCH: 26.9 pg (ref 26.0–34.0)
MCHC: 31.8 g/dL (ref 30.0–36.0)
MCV: 84.7 fL (ref 80.0–100.0)
Platelets: 240 10*3/uL (ref 150–400)
RBC: 5.09 MIL/uL (ref 4.22–5.81)
RDW: 17 % — ABNORMAL HIGH (ref 11.5–15.5)
WBC: 7.4 10*3/uL (ref 4.0–10.5)
nRBC: 0 % (ref 0.0–0.2)

## 2023-12-16 NOTE — Telephone Encounter (Signed)
Left message for patient to call back to review instructions for his procedure next week.

## 2023-12-20 NOTE — Pre-Procedure Instructions (Signed)
Instructed patient on the following items: Arrival time 1000 Nothing to eat or drink after midnight No meds AM of procedure Responsible person to drive you home and stay with you for 24 hrs Wash with special soap night before and morning of procedure If on anti-coagulant drug instructions Eliquis- last dose 1/19

## 2023-12-21 ENCOUNTER — Other Ambulatory Visit: Payer: Self-pay

## 2023-12-21 ENCOUNTER — Ambulatory Visit (HOSPITAL_COMMUNITY)
Admission: RE | Admit: 2023-12-21 | Discharge: 2023-12-21 | Disposition: A | Discharge status: Home / Self Care | Payer: Medicare Other | Attending: Cardiovascular Disease | Admitting: Cardiovascular Disease

## 2023-12-21 ENCOUNTER — Encounter (HOSPITAL_COMMUNITY)
Admission: RE | Disposition: A | Discharge status: Home / Self Care | Payer: Medicare Other | Source: Home / Self Care | Attending: Cardiovascular Disease

## 2023-12-21 ENCOUNTER — Ambulatory Visit (HOSPITAL_COMMUNITY): Payer: Medicare Other

## 2023-12-21 DIAGNOSIS — G901 Familial dysautonomia [Riley-Day]: Secondary | ICD-10-CM | POA: Insufficient documentation

## 2023-12-21 DIAGNOSIS — D6869 Other thrombophilia: Secondary | ICD-10-CM | POA: Insufficient documentation

## 2023-12-21 DIAGNOSIS — I5042 Chronic combined systolic (congestive) and diastolic (congestive) heart failure: Secondary | ICD-10-CM | POA: Insufficient documentation

## 2023-12-21 DIAGNOSIS — I495 Sick sinus syndrome: Secondary | ICD-10-CM | POA: Diagnosis not present

## 2023-12-21 DIAGNOSIS — T82110A Breakdown (mechanical) of cardiac electrode, initial encounter: Secondary | ICD-10-CM | POA: Insufficient documentation

## 2023-12-21 DIAGNOSIS — I252 Old myocardial infarction: Secondary | ICD-10-CM | POA: Insufficient documentation

## 2023-12-21 DIAGNOSIS — Z7901 Long term (current) use of anticoagulants: Secondary | ICD-10-CM | POA: Insufficient documentation

## 2023-12-21 DIAGNOSIS — I2510 Atherosclerotic heart disease of native coronary artery without angina pectoris: Secondary | ICD-10-CM | POA: Diagnosis not present

## 2023-12-21 DIAGNOSIS — Z955 Presence of coronary angioplasty implant and graft: Secondary | ICD-10-CM | POA: Diagnosis not present

## 2023-12-21 DIAGNOSIS — Y831 Surgical operation with implant of artificial internal device as the cause of abnormal reaction of the patient, or of later complication, without mention of misadventure at the time of the procedure: Secondary | ICD-10-CM | POA: Insufficient documentation

## 2023-12-21 DIAGNOSIS — I442 Atrioventricular block, complete: Secondary | ICD-10-CM | POA: Insufficient documentation

## 2023-12-21 DIAGNOSIS — I951 Orthostatic hypotension: Secondary | ICD-10-CM | POA: Insufficient documentation

## 2023-12-21 DIAGNOSIS — I428 Other cardiomyopathies: Secondary | ICD-10-CM | POA: Diagnosis not present

## 2023-12-21 DIAGNOSIS — I480 Paroxysmal atrial fibrillation: Secondary | ICD-10-CM

## 2023-12-21 HISTORY — PX: LEAD INSERTION: EP1212

## 2023-12-21 HISTORY — PX: PPM GENERATOR CHANGEOUT: EP1233

## 2023-12-21 SURGERY — PPM GENERATOR CHANGEOUT

## 2023-12-21 MED ORDER — MIDAZOLAM HCL 5 MG/5ML IJ SOLN
INTRAMUSCULAR | Status: AC
Start: 2023-12-21 — End: ?
  Filled 2023-12-21: qty 5

## 2023-12-21 MED ORDER — CEFAZOLIN SODIUM-DEXTROSE 2-4 GM/100ML-% IV SOLN
INTRAVENOUS | Status: AC
  Filled 2023-12-21: qty 100

## 2023-12-21 MED ORDER — POVIDONE-IODINE 10 % EX SWAB
2.0000 | Freq: Once | CUTANEOUS | Status: AC
  Administered 2023-12-21: 2 via TOPICAL

## 2023-12-21 MED ORDER — CHLORHEXIDINE GLUCONATE 4 % EX SOLN: 4.0000 | Freq: Once | CUTANEOUS | Status: DC

## 2023-12-21 MED ORDER — ONDANSETRON HCL 4 MG/2ML IJ SOLN: 4.0000 mg | Freq: Four times a day (QID) | INTRAMUSCULAR | Status: DC | PRN

## 2023-12-21 MED ORDER — HEPARIN (PORCINE) IN NACL 1000-0.9 UT/500ML-% IV SOLN
INTRAVENOUS | Status: DC | PRN
  Administered 2023-12-21: 500 mL

## 2023-12-21 MED ORDER — FENTANYL CITRATE (PF) 100 MCG/2ML IJ SOLN
INTRAMUSCULAR | Status: DC | PRN
  Administered 2023-12-21: 25 ug via INTRAVENOUS

## 2023-12-21 MED ORDER — IOHEXOL 350 MG/ML SOLN
INTRAVENOUS | Status: DC | PRN
  Administered 2023-12-21: 15 mL

## 2023-12-21 MED ORDER — FENTANYL CITRATE (PF) 100 MCG/2ML IJ SOLN
INTRAMUSCULAR | Status: AC
  Filled 2023-12-21: qty 2

## 2023-12-21 MED ORDER — SODIUM CHLORIDE 0.9 % IV SOLN
80.0000 mg | INTRAVENOUS | Status: AC
  Administered 2023-12-21: 80 mg

## 2023-12-21 MED ORDER — ELIQUIS 5 MG PO TABS: ORAL_TABLET | ORAL | Status: DC

## 2023-12-21 MED ORDER — VANCOMYCIN HCL IN DEXTROSE 1-5 GM/200ML-% IV SOLN
INTRAVENOUS | Status: AC
  Filled 2023-12-21: qty 200

## 2023-12-21 MED ORDER — SODIUM CHLORIDE 0.9 % IV SOLN: INTRAVENOUS | Status: DC

## 2023-12-21 MED ORDER — MIDAZOLAM HCL 5 MG/5ML IJ SOLN
INTRAMUSCULAR | Status: DC | PRN
  Administered 2023-12-21: 1 mg via INTRAVENOUS

## 2023-12-21 MED ORDER — VANCOMYCIN HCL IN DEXTROSE 1-5 GM/200ML-% IV SOLN
1000.0000 mg | INTRAVENOUS | Status: AC
  Administered 2023-12-21: 1000 mg via INTRAVENOUS

## 2023-12-21 MED ORDER — SODIUM CHLORIDE 0.9 % IV SOLN
INTRAVENOUS | Status: AC
  Filled 2023-12-21: qty 2

## 2023-12-21 MED ORDER — LIDOCAINE HCL (PF) 1 % IJ SOLN
INTRAMUSCULAR | Status: DC | PRN
  Administered 2023-12-21: 60 mL

## 2023-12-21 MED ORDER — LIDOCAINE HCL (PF) 1 % IJ SOLN
INTRAMUSCULAR | Status: AC
Start: 2023-12-21 — End: ?
  Filled 2023-12-21: qty 60

## 2023-12-21 SURGICAL SUPPLY — 16 items
CABLE SURGICAL S-101-97-12 (CABLE) ×1 IMPLANT
CATH CPS LOCATOR 3D MED (CATHETERS) IMPLANT
DEVICE DISSECT PLASMABLAD 3.0S (MISCELLANEOUS) IMPLANT
HELIX LOCKING TOOL (MISCELLANEOUS) ×1
LEAD ULTIPACE 65 LPA1231/65 (Lead) IMPLANT
PACEMAKER ASSURITY DR-RF (Pacemaker) IMPLANT
PAD DEFIB RADIO PHYSIO CONN (PAD) ×1 IMPLANT
PLASMABLADE 3.0S (MISCELLANEOUS) ×1
POUCH AIGIS-R ANTIBACT PPM (Mesh General) ×1 IMPLANT
POUCH AIGIS-R ANTIBACT PPM MED (Mesh General) IMPLANT
SHEATH 9FR PRELUDE SNAP 13 (SHEATH) IMPLANT
SLITTER AGILIS HISPRO (INSTRUMENTS) IMPLANT
TOOL HELIX LOCKING (MISCELLANEOUS) IMPLANT
TRAY PACEMAKER INSERTION (PACKS) ×1 IMPLANT
WIRE HI TORQ VERSACORE-J 145CM (WIRE) IMPLANT
WIRE MICRO SET SILHO 5FR 7 (SHEATH) IMPLANT

## 2023-12-21 NOTE — Progress Notes (Signed)
Spoke to pharmacist regarding allergy message with ancef and vancomycin (corn containing interaction).  Was instructed that she had received Vancomycin in 2016 and did ok so ok to use Vancomycin.

## 2023-12-21 NOTE — Discharge Instructions (Addendum)
After Your Pacemaker   You have a Abbott Pacemaker  ACTIVITY Do not lift your arm above shoulder height for 1 week after your procedure. After 7 days, you may progress as below.  You should remove your sling 24 hours after your procedure, unless otherwise instructed by your provider.     Wednesday December 28, 2023  Thursday December 29, 2023 Friday December 30, 2023 Saturday December 31, 2023   Do not lift, push, pull, or carry anything over 10 pounds with the affected arm until 6 weeks (Wednesday February 01, 2024 ) after your procedure.   You may drive AFTER your wound check, unless you have been told otherwise by your provider.   Ask your healthcare provider when you can go back to work   INCISION/Dressing  Eliquis, should be resumed. 12/25/2023  Remove outer bandage  tomorrow (12/22/2023) at 5:00 pm.Do not remove steri-strips or glue as below.   If a PRESSURE DRESSING (a bulky dressing that usually goes up over your shoulder) was applied or left in place, please follow instructions given by your provider on when to return to have this removed.   Monitor your Pacemaker site for redness, swelling, and drainage. Call the device clinic at [phone] if you experience these symptoms or fever/chills.  If your incision is sealed with Steri-strips or staples, you may shower 7 days after your procedure or when told by your provider. Do not remove the steri-strips or let the shower hit directly on your site. You may wash around your site with soap and water.    If you were discharged in a sling, please do not wear this during the day more than 48 hours after your surgery unless otherwise instructed. This may increase the risk of stiffness and soreness in your shoulder.   Avoid lotions, ointments, or perfumes over your incision until it is well-healed.  You may use a hot tub or a pool AFTER your wound check appointment if the incision is completely closed.  Pacemaker Alerts:  Some alerts are  vibratory and others beep. These are NOT emergencies. Please call our office to let us know. If this occurs at night or on weekends, it can wait until the next business day. Send a remote transmission.  If your device is capable of reading fluid status (for heart failure), you will be offered monthly monitoring to review this with you.   DEVICE MANAGEMENT Remote monitoring is used to monitor your pacemaker from home. This monitoring is scheduled every 91 days by our office. It allows [place] to keep an eye on the functioning of your device to ensure it is working properly. You will routinely see your Electrophysiologist annually (more often if necessary).   You should receive your ID card for your new device in 4-8 weeks. Keep this card with you at all times once received. Consider wearing a medical alert bracelet or necklace.  Your Pacemaker may be MRI compatible. This will be discussed at your next office visit/wound check.  You should avoid contact with strong electric or magnetic fields.   Do not use amateur (ham) radio equipment or electric (arc) welding torches. MP3 player headphones with magnets should not be used. Some devices are safe to use if held at least 12 inches (30 cm) from your Pacemaker. These include power tools, lawn mowers, and speakers. If you are unsure if something is safe to use, ask your health care provider.  When using your cell phone, hold it to the ear that is  on the opposite side from the Pacemaker. Do not leave your cell phone in a pocket over the Pacemaker.  You may safely use electric blankets, heating pads, computers, and microwave ovens.  Call the office right away if: You have chest pain. You feel more short of breath than you have felt before. You feel more light-headed than you have felt before. Your incision starts to open up.  This information is not intended to replace advice given to you by your health care provider. Make sure you discuss any questions you  have with your health care provider.

## 2023-12-21 NOTE — H&P (Signed)
Electrophysiology Office Note:    Date:  12/21/2023   ID:  [name], DOB Oct 07, 1941, MRN [id number]  PCP:  Ignatius Specking, MD   [facility] Providers Cardiologist:  [name], MD Electrophysiologist:  Maurice Small, MD     Referring MD: No ref. provider found   History of Present Illness:    [name] is a 83 y.o. 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     He has an Abbott dual-chamber pacemaker placed in 2017 (gen change). The original device and leads were placed in 2000 (both tendril 1388) the current device was placed in October 2022.  He was hospitalized in 2021 with heart failure exacerbation, COVID-positive at the time.  He also had atrial fibrillation with RVR.  His EF was 25 to 30%.  He was started on GDMT.     Echocardiogram performed July 2024 showed a decrease in his ejection fraction from 50 to 55% in March 2023 to 40%.  His device interrogation today indicates that he has V paced 9%, however he is in complete heart block today and device dependent.  Additionally, a monitor was placed in October 2024 that appears to show a high burden of V pacing.  Several pause episodes were detected on the monitor as well.  His device interrogation today (11/89/24) shows an episode of ventricular lead noise with appropriate reversion to DOO.  His RV lead impedance has been chronically low at 240 ohms      He was scheduled for RV lead revision but this was cancelled because he has an acute respiratory infection. He has recovered. He has not had any changes in his medical condition otherwise.  He has a ride home but nobody to stay with him tonight. He has good relationships with neighbors help is quickly accessible. He does not have anybody that will be able to stay with him in the  future. He is not willing to be admitted for observation afterward. I informed him that there is risk of late complications and it is much safer to have a companion helping to keep an eye on him, and if that is not available, to spend the night in the hospital. He insists that neither are possible and accepts the risk.   He has not had any changes in his medical condition otherwise.  EKGs/Labs/Other Studies Reviewed Today:     Echocardiogram:  TTE June 10, 2023 EF 40%.  Mild LV hypertrophy.   Monitors:  Zio monitor -7 days -my interpretation Tober 2024 Rhythm is V-paced    EKG:         Physical Exam:    VS:  BP 116/70   Pulse 68   Temp 98 F (36.7 C) (Oral)   Resp 18   Ht 5\' 10"  (1.778 m)   Wt 95.3 kg   SpO2 96%   BMI 30.13 kg/m     Wt Readings from Last 3 Encounters:  12/21/23 95.3 kg  11/04/23 97.2 kg  10/07/23 96.9 kg     GEN:  Well nourished, well developed in no acute distress CARDIAC: RRR, no murmurs, rubs, gallops The device site is normal -- no tenderness, edema, drainage, redness, threatened erosion.  RESPIRATORY:  Normal work of breathing MUSCULOSKELETAL: no edema    ASSESSMENT & PLAN:    Respiratory infection  He will follow-up with his PCP Will reschedule lead revision once he recovers from his acute illness  Atrial fibrillation Was on amiodarone in the past, discontinued in July 2021 for pulmonary toxicity concerns He tolerates rate control medications poorly due to hypotension  Secondary hypercoagulable state Continue Eliquis 5 mg twice daily  Cardiomyopathy EF has declined, correlating with increased and RV pacing burden, which appears to be now close to 95%. I think he would benefit from CRT  Dysautonomia --orthostatic hypotension  No changes today  Complete heart block, Abbott dual-chamber pacemaker I am concerned that he may have an insulation breach in his RV lead with noise reversion episodes and low impedance We are going  to decrease the sensitivity today to avoid inhibition of pacing After discussion with my extracting colleagues, due to the age of the leads, we will not plan for lead extraction. If his vessel remains patent on venogram, we will plan for placement of a LBB lead   I discussed the indication for the procedure and the logistics, risks, potential benefit, and after care. I specifically explained that risks include but are not limited to infection, bleeding,damage to blood vessels, lung, and the heart -- but risk of prolonged hospitalization, need for surgery, or the event of stroke, heart attack, or death are low but not zero.     Signed, Maurice Small, MD  12/21/2023 11:13 AM    [facility]

## 2023-12-22 ENCOUNTER — Encounter (HOSPITAL_COMMUNITY): Payer: Self-pay | Admitting: Cardiovascular Disease

## 2024-01-05 ENCOUNTER — Ambulatory Visit: Payer: Medicare Other

## 2024-01-06 ENCOUNTER — Ambulatory Visit: Payer: Medicare Other | Attending: Cardiovascular Disease

## 2024-01-06 DIAGNOSIS — I4819 Other persistent atrial fibrillation: Secondary | ICD-10-CM

## 2024-01-06 DIAGNOSIS — I428 Other cardiomyopathies: Secondary | ICD-10-CM

## 2024-01-06 LAB — CUP PACEART INCLINIC DEVICE CHECK
Battery Remaining Longevity: 102 mo
Battery Voltage: 3.04 V
Brady Statistic RA Percent Paced: 66 %
Brady Statistic RV Percent Paced: 99.97 %
Date Time Interrogation Session: 20250207172753
Implantable Lead Connection Status: 753985
Implantable Lead Connection Status: 753985
Implantable Lead Implant Date: 20001120
Implantable Lead Implant Date: 20250122
Implantable Lead Location: 753859
Implantable Lead Location: 753860
Implantable Pulse Generator Implant Date: 20250122
Lead Channel Impedance Value: 437.5 Ohm
Lead Channel Impedance Value: 525 Ohm
Lead Channel Pacing Threshold Amplitude: 0.75 V
Lead Channel Pacing Threshold Amplitude: 0.75 V
Lead Channel Pacing Threshold Amplitude: 1 V
Lead Channel Pacing Threshold Amplitude: 1 V
Lead Channel Pacing Threshold Pulse Width: 0.5 ms
Lead Channel Pacing Threshold Pulse Width: 0.5 ms
Lead Channel Pacing Threshold Pulse Width: 0.5 ms
Lead Channel Pacing Threshold Pulse Width: 0.5 ms
Lead Channel Sensing Intrinsic Amplitude: 5 mV
Lead Channel Setting Pacing Amplitude: 1.125
Lead Channel Setting Pacing Amplitude: 2.5 V
Lead Channel Setting Pacing Pulse Width: 0.5 ms
Lead Channel Setting Sensing Sensitivity: 4 mV
Pulse Gen Model: 2272
Pulse Gen Serial Number: 8233270

## 2024-01-06 NOTE — Patient Instructions (Addendum)
   After Your Pacemaker   Monitor your pacemaker site for redness, swelling, and drainage. Call the device clinic at [phone] if you experience these symptoms or fever/chills.  Your incision was closed with Steri-strips or staples:  You may shower 7 days after your procedure and wash your incision with soap and water. Avoid lotions, ointments, or perfumes over your incision until it is well-healed.  You may use a hot tub or a pool after your wound check appointment if the incision is completely closed.  Do not lift, push or pull greater than 10 pounds with the affected arm until 6 weeks (02-01-24) after your procedure. There are no other restrictions in arm movement after your wound check appointment.  Remote monitoring is used to monitor your pacemaker from home. This monitoring is scheduled every 91 days by our office. It allows us  to keep an eye on the functioning of your device to ensure it is working properly. You will routinely see your Electrophysiologist annually (more often if necessary).

## 2024-01-06 NOTE — Progress Notes (Signed)
 Normal Pacemaker wound check. Wound well healed. Thresholds, sensing, and impedances consistent with implant measurements and at 3.5V safety margin/auto capture until 3 month visit. 5 AMS episodes w/ overall duration 31%. Reviewed arm restrictions to continue for 6 weeks total post op.  Pt enrolled in remote follow-up. BV pacing 99%.

## 2024-01-23 ENCOUNTER — Telehealth: Payer: Self-pay | Admitting: Cardiology

## 2024-01-23 DIAGNOSIS — I480 Paroxysmal atrial fibrillation: Secondary | ICD-10-CM

## 2024-01-23 MED ORDER — ELIQUIS 5 MG PO TABS: ORAL_TABLET | ORAL | 1 refills | Status: DC

## 2024-01-23 NOTE — Telephone Encounter (Signed)
 Pt came in office today with letter from his insurance saying his Eliquis is on their list but it has limits and is RX is not within certain limits they require. It is stating they will give him a temporary supply and that he needs to talk with the prescriber about changing the RX. [phone] is the best number to reach the patient. I have made a copy of the letter and it is up front.

## 2024-01-23 NOTE — Telephone Encounter (Signed)
 Called and spoke with pt. Pt states he needs this refill send to [facility].  Prescription refill request for Eliquis received. Indication: Afib  Last office visit: 11/04/23 ([place])  Scr: 1.10 (12/16/23)  Age: 83 Weight: 95.3kg  Appropriate dose. Refill sent.

## 2024-01-31 ENCOUNTER — Ambulatory Visit: Admitting: Urology

## 2024-01-31 VITALS — BP 167/75 | HR 71

## 2024-01-31 DIAGNOSIS — Z87440 Personal history of urinary (tract) infections: Secondary | ICD-10-CM

## 2024-01-31 DIAGNOSIS — N3001 Acute cystitis with hematuria: Secondary | ICD-10-CM

## 2024-01-31 DIAGNOSIS — R399 Unspecified symptoms and signs involving the genitourinary system: Secondary | ICD-10-CM

## 2024-01-31 NOTE — Progress Notes (Signed)
 01/31/2024 4:10 PM   [name] 02-28-1941 [id number]  Referring provider: Ignatius Specking, MD [address] [place],  [place] [address]  No chief complaint on file.   HPI: 83 year old male without prior urologic history comes in today, walk-in patient requesting new appointment.  He states that he has had recurring urinary tract infections for several months.  He has been treated by his PCP in [place], Dr. Sherril Croon with antibiotics on at least 3-4 occasions.  Infections affect him by making him urinate more frequently and having dysuria.  Several episodes of gross hematuria as well.  When he is on antibiotics, his urinary symptoms improved.  However, following completion of antibiotics, symptoms recur.  No associated fever, hospitalizations for urinary infections, abdominal or lateralizing flank pain.  No prior history of kidney stones.  Overall, he feels like he usually empties well.  He does have bothersome LUTS with and without infections, IPSS 18/6.    PMH: Past Medical History:  Diagnosis Date   Acute ST elevation myocardial infarction (STEMI) of inferior wall [facility])    March 2022   Asthma    Bronchitis    CAD (coronary artery disease)    DES to RCA with staged DES to mid LAD and DES to proximal to mid circumflex March 2022   Cardiomyopathy [facility])    Possibly tachycardia mediated, also history of COVID-19 and CAD   History of epididymitis    History of recurrent UTIs    History of ruptured appendix    Paroxysmal atrial fibrillation (HCC)    Peptic ulcer disease    Pneumonia due to COVID-19 virus    Presence of permanent cardiac pacemaker    Tachy-brady syndrome (HCC)    PPM (SJM) implanted by Dr Graciela Husbands 10/19/99, gen change 04/03/08    Surgical History: Past Surgical History:  Procedure Laterality Date   APPENDECTOMY     CARDIOVERSION [name] 02/05/2021   Procedure: CARDIOVERSION;  Surgeon: Jonelle Sidle, MD;  Location: [facility];  Service: Cardiovascular;  Laterality: [name];    CORONARY STENT INTERVENTION [name] 02/16/2021   Procedure: CORONARY STENT INTERVENTION;  Surgeon: Marykay Lex, MD;  Location: [facility] INVASIVE CV LAB;  Service: Cardiovascular;  Laterality: [name];   CORONARY/GRAFT ACUTE MI REVASCULARIZATION [name] 02/13/2021   Procedure: Coronary/Graft Acute MI Revascularization;  Surgeon: Lennette Bihari, MD;  Location: [facility] INVASIVE CV LAB;  Service: Cardiovascular;  Laterality: [name];   EP IMPLANTABLE DEVICE [name] 11/17/2016   Procedure: PPM Generator Changeout;  Surgeon: Marinus Maw, MD;  Location: [facility] INVASIVE CV LAB;  Service: Cardiovascular;  Laterality: [name];   INSERT / REPLACE / REMOVE PACEMAKER     LEAD INSERTION [name] 12/21/2023   Procedure: LEAD INSERTION;  Surgeon: Maurice Small, MD;  Location: [facility] INVASIVE CV LAB;  Service: Cardiovascular;  Laterality: [name];   LEFT HEART CATH [name] 02/16/2021   Procedure: Left Heart Cath;  Surgeon: Marykay Lex, MD;  Location: [facility] INVASIVE CV LAB;  Service: Cardiovascular;  Laterality: [name];   LEFT HEART CATH AND CORONARY ANGIOGRAPHY [name] 02/13/2021   Procedure: LEFT HEART CATH AND CORONARY ANGIOGRAPHY;  Surgeon: Lennette Bihari, MD;  Location: [facility] INVASIVE CV LAB;  Service: Cardiovascular;  Laterality: [name];   PACEMAKER PLACEMENT  03/2008   [employer] [facility], [place]. 5826   PPM GENERATOR CHANGEOUT [name] 12/21/2023   Procedure: PPM GENERATOR CHANGEOUT;  Surgeon: Nelly Laurence, Roberts Gaudy, MD;  Location: [facility] INVASIVE CV LAB;  Service: Cardiovascular;  Laterality: [name];    Home Medications:  Allergies as of 01/31/2024       Reactions   Bee Venom Shortness Of Breath   BEE STINGS - dizziness    Corn-containing Products Anaphylaxis   Confirmed with pt that he avoids all corn containing products. SS 04/30/20 Patient states cooking spray is not an issue   Citrullus Vulgaris Other (See Comments)   (Watermelon)--GI Upset (intolerance)   Codeine Other (See Comments)   REACTION: muscle cramps   Invert Sugar Other (See Comments)   Unknown reaction   Rice  Other (See Comments)   Unknown reaction   Vanilla Diarrhea        Medication List        Accurate as of January 31, 2024  4:10 PM. If you have any questions, ask your nurse or doctor.          acetaminophen 500 MG tablet Commonly known as: TYLENOL Take 500-1,000 mg by mouth every 6 (six) hours as needed (pain.).   amiodarone 200 MG tablet Commonly known as: PACERONE Take 200 mg by mouth in the morning.   atorvastatin 80 MG tablet Commonly known as: LIPITOR Take 1 tablet by mouth once daily   Compression Bandages Kit 1 each by Does not apply route daily.   Eliquis 5 MG Tabs tablet Generic drug: apixaban TAKE 1 TABLET BY MOUTH TWICE DAILY   nitroGLYCERIN 0.4 MG SL tablet Commonly known as: Nitrostat Place 1 tablet (0.4 mg total) under the tongue every 5 (five) minutes as needed.   VITAMIN B-12 PO Take 1 tablet by mouth daily.        Allergies:  Allergies  Allergen Reactions   Bee Venom Shortness Of Breath    BEE STINGS - dizziness    Corn-Containing Products Anaphylaxis    Confirmed with pt that he avoids all corn containing products. SS 04/30/20 Patient states cooking spray is not an issue   Citrullus Vulgaris Other (See Comments)    (Watermelon)--GI Upset (intolerance)    Codeine Other (See Comments)    REACTION: muscle cramps   Invert Sugar Other (See Comments)    Unknown reaction   Rice Other (See Comments)    Unknown reaction   Vanilla Diarrhea    Family History: Family History  Problem Relation Age of Onset   Suicidality Mother    Cancer Father     Social History:  reports that he quit smoking about 46 years ago. His smoking use included cigarettes. He started smoking about 64 years ago. He has a 30 pack-year smoking history. He has never been exposed to tobacco smoke. He has never used smokeless tobacco. He reports that he does not drink alcohol and does not use drugs.  ROS: All other review of systems were reviewed and are negative except  what is noted above in HPI  Physical Exam: BP (!) 167/75   Pulse 71   Constitutional:  Alert and oriented, No acute distress. HEENT: [place] AT, moist mucus membranes.  Trachea midline, no masses. Cardiovascular: No clubbing, cyanosis, or edema. Respiratory: Normal respiratory effort, no increased work of breathing. GI: No inguinal hernias noted GU: Phallus is uncircumcised, somewhat buried.  Mild phimosis.  Scrotal skin normal.  Testicles atrophic bilaterally.  Normal anal sphincter tone.  Prostate 30 g, symmetric, nonnodular nontender.  Lymph: No cervical or inguinal lymphadenopathy. Skin: No rashes, bruises or suspicious lesions. Neurologic: Grossly intact, no focal deficits, moving all 4 extremities. Psychiatric: Normal mood and affect.  Laboratory Data: Lab Results  Component Value Date  WBC 7.4 12/16/2023   HGB 13.7 12/16/2023   HCT 43.1 12/16/2023   MCV 84.7 12/16/2023   PLT 240 12/16/2023    Lab Results  Component Value Date   CREATININE 1.10 12/16/2023    Lab Results  Component Value Date   PSA (H) 05/02/2009    43.20 (NOTE) Result repeated and verified. Test Methodology: [employer] PSA    No results found for: "TESTOSTERONE"  Lab Results  Component Value Date   HGBA1C 6.0 (H) 02/13/2021    Urinalysis    Component Value Date/Time   COLORURINE YELLOW 04/29/2009 0153   APPEARANCEUR CLEAR 04/29/2009 0153   LABSPEC 1.005 04/29/2009 0153   PHURINE 6.0 04/29/2009 0153   GLUCOSEU NEGATIVE 04/29/2009 0153   HGBUR SMALL (A) 04/29/2009 0153   BILIRUBINUR NEGATIVE 04/29/2009 0153   KETONESUR 15 (A) 04/29/2009 0153   PROTEINUR NEGATIVE 04/29/2009 0153   UROBILINOGEN 1.0 04/29/2009 0153   NITRITE NEGATIVE 04/29/2009 0153   LEUKOCYTESUR MODERATE (A) 04/29/2009 0153    Lab Results  Component Value Date   BACTERIA RARE 04/29/2009    Pertinent Imaging:  Has had prior CT abdomen and pelvis--images not available but readings revealed normal renal appearance  bilaterally, enlarged prostate with calcification.  No renal calculi.   Prior PSA 15 years ago was 43.2.  Bladder scan volume 63 mL Assessment  1. Acute cystitis with hematuria (Primary)- it appears he has UTI today.  No worries of upper tract abnormalities.  CT 4 years ago revealed prostatic enlargement but no bladder abnormalities.  2.  Gross hematuria, most likely from his cystitis  3.  Remote history of elevated PSA, 43.  That was 15 years ago.  He has a normal prostate exam now.  - Urinalysis, Routine w reflex microscopic - BLADDER SCAN AMB NON-IMAGING - Urine Culture  Plan: 1.  His urine was cultured.  No antibiotics administered yet  2.  I told him he can take AZO if he has dysuria  3.  Once cultures are back, we will send an appropriate antibiotic for approximately 10 days  4.  He will need follow-up after that to recheck urine.  5.  As I do not have any follow-up PSA on him, I will recheck PSA when he does not have a UTI. No follow-ups on file.  Chelsea Aus, MD  [facility] [place]

## 2024-01-31 NOTE — Progress Notes (Signed)
 Bladder Scan completed today.  Patient can void prior to the bladder scan. Bladder scan result: 68  Performed By: Alfonse Spruce. CMA  Additional notes-

## 2024-02-01 LAB — MICROSCOPIC EXAMINATION
RBC, Urine: >30 /HPF — AB (ref 0–2)
WBC, UA: >30 /HPF — AB (ref 0–5)

## 2024-02-01 LAB — URINALYSIS, ROUTINE W REFLEX MICROSCOPIC
Bilirubin, UA: NEGATIVE
Glucose, UA: NEGATIVE
Ketones, UA: NEGATIVE
Nitrite, UA: POSITIVE — AB
Specific Gravity, UA: 1.025 (ref 1.005–1.030)
Urobilinogen, Ur: 0.2 mg/dL (ref 0.2–1.0)
pH, UA: 6.5 (ref 5.0–7.5)

## 2024-02-03 ENCOUNTER — Other Ambulatory Visit: Payer: Self-pay | Admitting: Urology

## 2024-02-03 ENCOUNTER — Telehealth: Payer: Self-pay

## 2024-02-03 DIAGNOSIS — N3001 Acute cystitis with hematuria: Secondary | ICD-10-CM

## 2024-02-03 LAB — URINE CULTURE

## 2024-02-03 MED ORDER — AMOXICILLIN-POT CLAVULANATE 875-125 MG PO TABS: ORAL_TABLET | ORAL | 0 refills | Status: AC

## 2024-02-03 NOTE — Telephone Encounter (Signed)
 Called Pt to relay message from MD see prior telephone encounter

## 2024-02-03 NOTE — Telephone Encounter (Signed)
-----   Message from Bertram Millard Dahlstedt sent at 02/03/2024  7:11 AM EST ----- Please call pt--urine culture showed a bacteria that more than likely was resistant to prior abx--I sent in a new one ----- Message ----- From: Interface, [facility] Lab Results In Sent: 02/01/2024   5:38 AM EST To: Marcine Matar, MD

## 2024-02-06 ENCOUNTER — Telehealth: Payer: Self-pay | Admitting: Urology

## 2024-02-06 MED ORDER — FOSFOMYCIN TROMETHAMINE 3 G PO PACK: 3.0000 g | PACK | ORAL | 1 refills | Status: DC

## 2024-02-06 NOTE — Telephone Encounter (Signed)
 See other encounter.

## 2024-02-06 NOTE — Telephone Encounter (Signed)
 Patient states he does not trust the amoxicillin that was sent in because of the directions on the bottle.  He states he is not having a procedure.  I informed him to take the rx BID every 12 hours however, he states he will not take it he would like a different abx be sent.  Patient also requested to see another provider in office.  Follow up appt scheduled with Dr. Ronne Binning on 03/26.  Dr. Ronne Binning reviewed pt's last urine culture and gave verbal, to send in fosfoymcin 3 gram packet with instructions for patient to take first packed by mouth and take the second packet after 72 hours.  Patient aware of MD instructions.

## 2024-02-06 NOTE — Addendum Note (Signed)
 Addended by: Grier Rocher on: 02/06/2024 09:51 AM   Modules accepted: Orders

## 2024-02-06 NOTE — Telephone Encounter (Signed)
 Patient has questions about how to take his medication

## 2024-02-07 ENCOUNTER — Telehealth: Payer: Self-pay

## 2024-02-07 NOTE — Telephone Encounter (Signed)
 Medication prior authorization request received.  Completed PA request through cover my meds for drug Fosfomycin. KEY: Key: [id number]  Approved: Pending

## 2024-02-10 ENCOUNTER — Other Ambulatory Visit: Payer: Self-pay | Admitting: Cardiology

## 2024-02-10 MED ORDER — AMIODARONE HCL 200 MG PO TABS: 200.0000 mg | ORAL_TABLET | Freq: Every morning | ORAL | 1 refills | Status: DC

## 2024-02-22 ENCOUNTER — Ambulatory Visit: Admitting: Urology

## 2024-02-29 ENCOUNTER — Ambulatory Visit: Admitting: Urology

## 2024-03-12 ENCOUNTER — Other Ambulatory Visit: Payer: Self-pay | Admitting: Urology

## 2024-03-14 NOTE — Telephone Encounter (Signed)
 Patient stopped in to get a refill on fosfomycin (MONUROL) 3 g PACK  , he has an infection again , and the pharmacy will not give him another refill without new rx being called in .    [facility]- in [place]

## 2024-03-19 NOTE — Telephone Encounter (Signed)
 Called pt to schedule OV for possible UTI pt did not answer unable to lvm

## 2024-03-22 ENCOUNTER — Ambulatory Visit (INDEPENDENT_AMBULATORY_CARE_PROVIDER_SITE_OTHER)

## 2024-03-22 DIAGNOSIS — I428 Other cardiomyopathies: Secondary | ICD-10-CM

## 2024-03-22 LAB — CUP PACEART REMOTE DEVICE CHECK
Battery Remaining Longevity: 103 mo
Battery Remaining Percentage: 95.5 %
Battery Voltage: 3.01 V
Brady Statistic AP VP Percent: 97 %
Brady Statistic AP VS Percent: 1 %
Brady Statistic AS VP Percent: 2.6 %
Brady Statistic AS VS Percent: 1 %
Brady Statistic RA Percent Paced: 63 %
Brady Statistic RV Percent Paced: 99 %
Date Time Interrogation Session: 20250424020014
Implantable Lead Connection Status: 753985
Implantable Lead Connection Status: 753985
Implantable Lead Implant Date: 20001120
Implantable Lead Implant Date: 20250122
Implantable Lead Location: 753859
Implantable Lead Location: 753860
Implantable Pulse Generator Implant Date: 20250122
Lead Channel Impedance Value: 380 Ohm
Lead Channel Impedance Value: 540 Ohm
Lead Channel Pacing Threshold Amplitude: 0.75 V
Lead Channel Pacing Threshold Amplitude: 0.875 V
Lead Channel Pacing Threshold Pulse Width: 0.5 ms
Lead Channel Pacing Threshold Pulse Width: 0.5 ms
Lead Channel Sensing Intrinsic Amplitude: 5 mV
Lead Channel Sensing Intrinsic Amplitude: 6.4 mV
Lead Channel Setting Pacing Amplitude: 1.125
Lead Channel Setting Pacing Amplitude: 2.5 V
Lead Channel Setting Pacing Pulse Width: 0.5 ms
Lead Channel Setting Sensing Sensitivity: 4 mV
Pulse Gen Model: 2272
Pulse Gen Serial Number: 8233270

## 2024-03-30 ENCOUNTER — Ambulatory Visit: Payer: Medicare Other | Attending: Cardiovascular Disease | Admitting: Cardiovascular Disease

## 2024-04-18 ENCOUNTER — Other Ambulatory Visit: Payer: Self-pay | Admitting: Cardiology

## 2024-05-02 NOTE — Progress Notes (Signed)
 Remote pacemaker transmission.

## 2024-05-04 ENCOUNTER — Encounter: Payer: Medicare Other | Admitting: Cardiovascular Disease

## 2024-06-21 ENCOUNTER — Ambulatory Visit

## 2024-06-21 DIAGNOSIS — I428 Other cardiomyopathies: Secondary | ICD-10-CM | POA: Diagnosis not present

## 2024-06-22 LAB — CUP PACEART REMOTE DEVICE CHECK
Battery Remaining Longevity: 102 mo
Battery Remaining Percentage: 95.5 %
Battery Voltage: 3.01 V
Brady Statistic AP VP Percent: 96 %
Brady Statistic AP VS Percent: 1 %
Brady Statistic AS VP Percent: 2.5 %
Brady Statistic AS VS Percent: 1 %
Brady Statistic RA Percent Paced: 56 %
Brady Statistic RV Percent Paced: 99 %
Date Time Interrogation Session: 20250724053125
Implantable Lead Connection Status: 753985
Implantable Lead Connection Status: 753985
Implantable Lead Implant Date: 20001120
Implantable Lead Implant Date: 20250122
Implantable Lead Location: 753859
Implantable Lead Location: 753860
Implantable Pulse Generator Implant Date: 20250122
Lead Channel Impedance Value: 380 Ohm
Lead Channel Impedance Value: 510 Ohm
Lead Channel Pacing Threshold Amplitude: 0.75 V
Lead Channel Pacing Threshold Amplitude: 0.875 V
Lead Channel Pacing Threshold Pulse Width: 0.5 ms
Lead Channel Pacing Threshold Pulse Width: 0.5 ms
Lead Channel Sensing Intrinsic Amplitude: 12 mV
Lead Channel Sensing Intrinsic Amplitude: 5 mV
Lead Channel Setting Pacing Amplitude: 1.125
Lead Channel Setting Pacing Amplitude: 2.5 V
Lead Channel Setting Pacing Pulse Width: 0.5 ms
Lead Channel Setting Sensing Sensitivity: 4 mV
Pulse Gen Model: 2272
Pulse Gen Serial Number: 8233270

## 2024-06-24 ENCOUNTER — Ambulatory Visit: Payer: Self-pay | Admitting: Cardiovascular Disease

## 2024-08-01 ENCOUNTER — Other Ambulatory Visit: Payer: Self-pay | Admitting: Cardiology

## 2024-08-01 DIAGNOSIS — I480 Paroxysmal atrial fibrillation: Secondary | ICD-10-CM

## 2024-08-01 NOTE — Telephone Encounter (Signed)
 Pt last saw Dr Nancey 11/04/23, last labs 12/16/23 Creat 1.10, age 83, weight 95.3kg, based on specified criteria pt is on appropriate dosage of Eliquis  5mg  BID for afib.  Will refill rx.

## 2024-08-15 ENCOUNTER — Other Ambulatory Visit: Payer: Self-pay | Admitting: Cardiology

## 2024-08-21 ENCOUNTER — Other Ambulatory Visit: Payer: Self-pay | Admitting: Cardiology

## 2024-08-30 NOTE — Progress Notes (Signed)
 Remote PPM Transmission

## 2024-09-03 ENCOUNTER — Encounter: Payer: Self-pay | Admitting: Cardiology

## 2024-09-03 ENCOUNTER — Ambulatory Visit: Attending: Cardiology | Admitting: Cardiology

## 2024-09-03 ENCOUNTER — Other Ambulatory Visit (HOSPITAL_BASED_OUTPATIENT_CLINIC_OR_DEPARTMENT_OTHER): Payer: Self-pay

## 2024-09-03 VITALS — BP 131/84 | HR 69 | Ht 70.0 in | Wt 225.8 lb

## 2024-09-03 DIAGNOSIS — I480 Paroxysmal atrial fibrillation: Secondary | ICD-10-CM

## 2024-09-03 DIAGNOSIS — I4819 Other persistent atrial fibrillation: Secondary | ICD-10-CM | POA: Diagnosis not present

## 2024-09-03 DIAGNOSIS — Z950 Presence of cardiac pacemaker: Secondary | ICD-10-CM

## 2024-09-03 DIAGNOSIS — I25119 Atherosclerotic heart disease of native coronary artery with unspecified angina pectoris: Secondary | ICD-10-CM

## 2024-09-03 DIAGNOSIS — I5020 Unspecified systolic (congestive) heart failure: Secondary | ICD-10-CM

## 2024-09-03 MED ORDER — NITROGLYCERIN 0.4 MG SL SUBL
0.4000 mg | SUBLINGUAL_TABLET | SUBLINGUAL | 2 refills | Status: AC | PRN
  Filled 2024-09-03: qty 25, 8d supply, fill #0

## 2024-09-03 NOTE — Patient Instructions (Addendum)
 Medication Instructions:  Your physician recommends that you continue on your current medications as directed. Please refer to the Current Medication list given to you today.  Labwork: none  Testing/Procedures: Your physician has requested that you have an echocardiogram. Echocardiography is a painless test that uses sound waves to create images of your heart. It provides your doctor with information about the size and shape of your heart and how well your heart's chambers and valves are working. This procedure takes approximately one hour. There are no restrictions for this procedure. Please do NOT wear cologne, perfume, aftershave, or lotions (deodorant is allowed). Please arrive 15 minutes prior to your appointment time.  Please note: We ask at that you not bring children with you during ultrasound (echo/ vascular) testing. Due to room size and safety concerns, children are not allowed in the ultrasound rooms during exams. Our front office staff cannot provide observation of children in our lobby area while testing is being conducted. An adult accompanying a patient to their appointment will only be allowed in the ultrasound room at the discretion of the ultrasound technician under special circumstances. We apologize for any inconvenience.  Follow-Up: Your physician recommends that you schedule a follow-up appointment in: 3 months  Any Other Special Instructions Will Be Listed Below (If Applicable).  If you need a refill on your cardiac medications before your next appointment, please call your pharmacy.

## 2024-09-03 NOTE — Progress Notes (Signed)
 Cardiology Office Note  Date: 09/03/2024   ID: [name], DOB October 09, 1941, MRN [id number]  History of Present Illness: [name] is an 83 y.o. male last seen in October 2024 by Ms. [name] with more recent follow-up by [name], I reviewed notes.  He is here for a follow-up visit.  He reports no orthostatic lightheadedness, no palpitations or syncope.  No exertional chest pain with typical activities.  His weight is up in general, but he denies any orthopnea or leg swelling.  Does state that he uses a lot of salt in his food.  St. Jude dual-chamber pacemaker in place with follow-up by [name].  Device interrogation in July indicated 42% AF burden (previously 67% in April).  He underwent placement of new generator (Abbott Assurity MRI) with new RV lead back in January.  He is due now for a follow-up device interrogation.  We went over his medications, he continues on amiodarone  200 mg daily, Lipitor  80 mg daily, and Eliquis  5 mg twice daily.  We took him off low-dose Cozaar  last year in the setting of orthostasis and relatively low blood pressures at baseline.  Orthostatic measurements were made today as recorded in the chart and nondiagnostic.  Systolics went from the 140s to the 130s.  I reviewed his ECG today which shows a ventricular paced rhythm with underlying atrial fibrillation.  Physical Exam: VS:  BP (!) 148/82 (BP Location: Left Arm)   Pulse 70   Ht 5' 10 (1.778 m)   Wt 225 lb 12.8 oz (102.4 kg)   SpO2 95%   BMI 32.40 kg/m , BMI Body mass index is 32.4 kg/m.  Wt Readings from Last 3 Encounters:  09/03/24 225 lb 12.8 oz (102.4 kg)  12/21/23 210 lb (95.3 kg)  11/04/23 214 lb 3.2 oz (97.2 kg)    General: Patient appears comfortable at rest. HEENT: Conjunctiva and lids normal. Neck: Supple, no elevated JVP or carotid bruits. Lungs: Clear to auscultation, nonlabored breathing at rest. Cardiac: Regular rate and rhythm, no S3, 1/6 systolic  murmur. Extremities: Trace ankle edema.  ECG:  An ECG dated 12/21/2023 was personally reviewed today and demonstrated:  Ventricular pacing with underlying atypical atrial flutter.  Labwork: 12/16/2023: BUN 23; Creatinine, Ser 1.10; Hemoglobin 13.7; Platelets 240; Potassium 4.4; Sodium 137  August 2025: Hemoglobin 13.2, platelets 232, BUN 26, creatinine 1.4, GFR 51, potassium 4.4, AST 25, ALT 24, cholesterol 145, triglycerides 98, HDL 50, LDL 77, TSH 2.38  Other Studies Reviewed Today:  Echocardiogram 09/06/2023 [facility]): Summary   1. Technically difficult study.    2. The left ventricle is normal in size with mildly increased wall  thickness.   3. The left ventricular systolic function is moderately decreased, LVEF is  visually estimated at 40-45%.    4. There is grade I diastolic dysfunction (impaired relaxation).    5. The mitral valve leaflets are mildly thickened with normal leaflet  mobility.   6. There is mild mitral valve regurgitation.    7. The aortic valve is probably trileaflet with mildly thickened leaflets  with normal excursion.    8. The right ventricle is mildly dilated in size, with normal systolic  function.   9. The aorta at the ascending aorta is mildly dilated.   Chest x-ray 12/21/2023: IMPRESSION: 1. Left-sided pacing device as above.  No pneumothorax. 2. 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  Assessment and Plan:  1.  CAD status post DES to the RCA  with staged DES to the mid LAD and DES to the proximal to mid circumflex in March 2022.  He does not report any angina at current level of activity.  Not on aspirin  given use of Eliquis .  Continue Lipitor  80 mg daily and as needed nitroglycerin .   2.  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  LVEF 40 to 45% by echocardiogram in October 2024 at [facility].  GDMT limited, taken off low-dose Cozaar  last year in the setting of  orthostasis.  Blood pressure trend back up today and not frankly orthostatic.  Plan to update echocardiogram and we can adjust GDMT slowly over time as tolerated.   3.  Persistent atrial fibrillation with CHA2DS2-VASc score of 3.  He remains on amiodarone  200 mg daily and Eliquis  5 mg twice daily.  Has had fairly high rhythm burden, 47% by last device check in July.  He will have a follow-up device check with Dr. Cleotilde later this week.  Question whether eventually amiodarone  would be stopped if he otherwise has complete heart block and is pacing normally with no substantial symptoms or RVR.   4.  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.  For device check.  Disposition:  Follow up 3 months.  Signed, [name], M.D., F.A.C.C. [facility] at [facility]

## 2024-09-05 NOTE — Progress Notes (Unsigned)
  Electrophysiology Office Note:    Date:  09/05/2024   ID:  [name], DOB 1941/10/18, MRN [id number]  PCP:  Rosamond Leta NOVAK, MD   [facility] Providers Cardiologist:  Jayson Sierras, MD Electrophysiologist:  Eulas FORBES Furbish, MD     Referring MD: Rosamond Leta NOVAK, MD   History of Present Illness:    [name] is a 83 y.o. 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     He has an Abbott dual-chamber pacemaker placed in 2017 (gen change). The original device and leads were placed in 2000 (both tendril 1388) the current device was placed in October 2022.  He was hospitalized in 2021 with heart failure exacerbation, COVID-positive at the time.  He also had atrial fibrillation with RVR.  His EF was 25 to 30%.  He was started on GDMT.     Echocardiogram performed July 2024 showed a decrease in his ejection fraction from 50 to 55% in March 2023 to 40%.  At our last visit, he was noted to be in complete heart block and device dependent, additionally his ventricular lead was showing evidence of malfunction.  He underwent placement of a new RV lead in the left bundle branch area.  The old malfunctioning lead was capped.  A new generator was placed      He was scheduled for RV lead revision but this was cancelled because he has an acute respiratory infection. Today, he feels sick. He has ongoing congestion, cough, drainage.   EKGs/Labs/Other Studies Reviewed Today:     Echocardiogram:  TTE June 10, 2023 EF 40%.  Mild LV hypertrophy.   Monitors:  Zio monitor -7 days -my interpretation Angelique 2024 Rhythm is V-paced    EKG:         Physical Exam:    VS:  There were no vitals taken for this visit.    Wt Readings from Last 3 Encounters:  09/03/24 225 lb 12.8 oz (102.4 kg)  12/21/23 210 lb  (95.3 kg)  11/04/23 214 lb 3.2 oz (97.2 kg)     GEN:  Well nourished, well developed in no acute distress CARDIAC: RRR, no murmurs, rubs, gallops The device site is normal -- no tenderness, edema, drainage, redness, threatened erosion.  RESPIRATORY:  Normal work of breathing MUSCULOSKELETAL: no edema    ASSESSMENT & PLAN:    Respiratory infection He will follow-up with his PCP Will reschedule lead revision once he recovers from his acute illness  Atrial fibrillation Was on amiodarone  in the past, discontinued in July 2021 for pulmonary toxicity concerns He tolerates rate control medications poorly due to hypotension  Secondary hypercoagulable state Continue Eliquis  5 mg twice daily  Cardiomyopathy EF has declined, correlating with increased and RV pacing burden, which appears to be now close to 95%. Now with left bundle branch area lead, QRS about 120 ms  Dysautonomia --orthostatic hypotension  No changes today  Complete heart block, Abbott dual-chamber pacemaker He had a fracture in his RV lead The malfunctioning lead was abandoned and a new RV LBBAP lead placed    Signed, Eulas FORBES Furbish, MD  09/05/2024 6:45 PM    [facility]

## 2024-09-06 ENCOUNTER — Encounter: Payer: Self-pay | Admitting: Cardiovascular Disease

## 2024-09-06 ENCOUNTER — Ambulatory Visit: Attending: Cardiovascular Disease | Admitting: Cardiovascular Disease

## 2024-09-06 VITALS — BP 138/76 | HR 70 | Ht 71.0 in | Wt 226.8 lb

## 2024-09-06 DIAGNOSIS — I420 Dilated cardiomyopathy: Secondary | ICD-10-CM

## 2024-09-06 DIAGNOSIS — I442 Atrioventricular block, complete: Secondary | ICD-10-CM

## 2024-09-06 DIAGNOSIS — I4819 Other persistent atrial fibrillation: Secondary | ICD-10-CM | POA: Diagnosis not present

## 2024-09-06 LAB — CUP PACEART INCLINIC DEVICE CHECK
Battery Remaining Longevity: 100 mo
Battery Voltage: 3.01 V
Brady Statistic RA Percent Paced: 52 %
Brady Statistic RV Percent Paced: 99 %
Date Time Interrogation Session: 20251009100548
Implantable Lead Connection Status: 753985
Implantable Lead Connection Status: 753985
Implantable Lead Implant Date: 20001120
Implantable Lead Implant Date: 20250122
Implantable Lead Location: 753859
Implantable Lead Location: 753860
Implantable Pulse Generator Implant Date: 20250122
Lead Channel Impedance Value: 412.5 Ohm
Lead Channel Impedance Value: 475 Ohm
Lead Channel Pacing Threshold Amplitude: 0.75 V
Lead Channel Pacing Threshold Pulse Width: 0.5 ms
Lead Channel Sensing Intrinsic Amplitude: 12 mV
Lead Channel Sensing Intrinsic Amplitude: 3.1 mV
Lead Channel Setting Pacing Amplitude: 1 V
Lead Channel Setting Pacing Amplitude: 2.5 V
Lead Channel Setting Pacing Pulse Width: 0.5 ms
Lead Channel Setting Sensing Sensitivity: 4 mV
Pulse Gen Model: 2272
Pulse Gen Serial Number: 8233270

## 2024-09-06 NOTE — Patient Instructions (Signed)
 Medication Instructions:  Continue all current medications.   Labwork: none  Testing/Procedures: none  Follow-Up: 6 months   Any Other Special Instructions Will Be Listed Below (If Applicable).   If you need a refill on your cardiac medications before your next appointment, please call your pharmacy.

## 2024-09-13 ENCOUNTER — Ambulatory Visit: Payer: Self-pay | Admitting: Cardiovascular Disease

## 2024-09-17 ENCOUNTER — Ambulatory Visit: Attending: Cardiology

## 2024-09-17 ENCOUNTER — Other Ambulatory Visit (HOSPITAL_BASED_OUTPATIENT_CLINIC_OR_DEPARTMENT_OTHER): Payer: Self-pay

## 2024-09-17 DIAGNOSIS — I5020 Unspecified systolic (congestive) heart failure: Secondary | ICD-10-CM

## 2024-09-17 MED ORDER — PERFLUTREN LIPID MICROSPHERE
1.0000 mL | INTRAVENOUS | Status: AC | PRN
  Administered 2024-09-17: 5 mL via INTRAVENOUS

## 2024-09-18 LAB — ECHOCARDIOGRAM COMPLETE
AR max vel: 2.39 cm2
AV Area VTI: 2.31 cm2
AV Area mean vel: 2.19 cm2
AV Mean grad: 3.7 mmHg
AV Peak grad: 7.6 mmHg
Ao pk vel: 1.38 m/s
Area-P 1/2: 2.99 cm2
Calc EF: 35.7 %
S' Lateral: 3.7 cm
Single Plane A2C EF: 32.9 %
Single Plane A4C EF: 39 %

## 2024-09-19 ENCOUNTER — Ambulatory Visit: Payer: Self-pay | Admitting: Cardiology

## 2024-09-20 ENCOUNTER — Ambulatory Visit (INDEPENDENT_AMBULATORY_CARE_PROVIDER_SITE_OTHER)

## 2024-09-20 DIAGNOSIS — I428 Other cardiomyopathies: Secondary | ICD-10-CM

## 2024-09-20 LAB — CUP PACEART REMOTE DEVICE CHECK
Battery Remaining Longevity: 104 mo
Battery Remaining Percentage: 93 %
Battery Voltage: 3.01 V
Brady Statistic AP VP Percent: 96 %
Brady Statistic AP VS Percent: 1 %
Brady Statistic AS VP Percent: 4.4 %
Brady Statistic AS VS Percent: 1 %
Brady Statistic RA Percent Paced: 24 %
Brady Statistic RV Percent Paced: 99 %
Date Time Interrogation Session: 20251023021541
Implantable Lead Connection Status: 753985
Implantable Lead Connection Status: 753985
Implantable Lead Implant Date: 20001120
Implantable Lead Implant Date: 20250122
Implantable Lead Location: 753859
Implantable Lead Location: 753860
Implantable Pulse Generator Implant Date: 20250122
Lead Channel Impedance Value: 380 Ohm
Lead Channel Impedance Value: 450 Ohm
Lead Channel Pacing Threshold Amplitude: 0.75 V
Lead Channel Pacing Threshold Amplitude: 0.75 V
Lead Channel Pacing Threshold Pulse Width: 0.5 ms
Lead Channel Pacing Threshold Pulse Width: 0.5 ms
Lead Channel Sensing Intrinsic Amplitude: 12 mV
Lead Channel Sensing Intrinsic Amplitude: 5 mV
Lead Channel Setting Pacing Amplitude: 1 V
Lead Channel Setting Pacing Amplitude: 2.5 V
Lead Channel Setting Pacing Pulse Width: 0.5 ms
Lead Channel Setting Sensing Sensitivity: 4 mV
Pulse Gen Model: 2272
Pulse Gen Serial Number: 8233270

## 2024-09-21 NOTE — Progress Notes (Signed)
 Remote PPM Transmission

## 2024-09-25 ENCOUNTER — Telehealth: Payer: Self-pay

## 2024-09-25 NOTE — Telephone Encounter (Signed)
 Pt seen [name] on 10/9 and discussed Afib Ablation - he wanted to think about it. I called him to see if he has made a decision and he ask if I could call him back after the [place] is over.   I will call him back in a few weeks.

## 2024-10-03 ENCOUNTER — Ambulatory Visit: Payer: Self-pay | Admitting: Cardiovascular Disease

## 2024-10-10 NOTE — Telephone Encounter (Signed)
Attempted phone call to pt.  No answer and no voicemail. 

## 2024-11-10 ENCOUNTER — Other Ambulatory Visit: Payer: Self-pay | Admitting: Cardiology

## 2024-11-10 DIAGNOSIS — I480 Paroxysmal atrial fibrillation: Secondary | ICD-10-CM

## 2024-11-12 NOTE — Telephone Encounter (Signed)
 Pt last saw Dr Nancey 09/06/24, last labs 12/16/23 Creat 1.10, age 83, weight 102.9kg, based on specified criteria pt is on appropriate dosage of Eliquis  5mg  BID for afib.  Will refill rx.

## 2024-11-14 ENCOUNTER — Other Ambulatory Visit: Payer: Self-pay | Admitting: Cardiology

## 2024-11-16 ENCOUNTER — Telehealth: Payer: Self-pay

## 2024-11-16 DIAGNOSIS — I480 Paroxysmal atrial fibrillation: Secondary | ICD-10-CM

## 2024-11-16 NOTE — Telephone Encounter (Signed)
 Called pt and scheduled Afib Ablation with [name] on 1/30 at 11:30 am.  He will go to [facility] to have labs done.   I will mail his Instruction letter to him - address verified.

## 2024-12-03 DIAGNOSIS — I480 Paroxysmal atrial fibrillation: Secondary | ICD-10-CM

## 2024-12-10 ENCOUNTER — Other Ambulatory Visit (HOSPITAL_COMMUNITY): Payer: Self-pay

## 2024-12-10 ENCOUNTER — Other Ambulatory Visit (HOSPITAL_COMMUNITY)
Admission: RE | Admit: 2024-12-10 | Discharge: 2024-12-10 | Disposition: A | Discharge status: Home / Self Care | Source: Ambulatory Visit | Attending: Cardiovascular Disease | Admitting: Cardiovascular Disease

## 2024-12-10 DIAGNOSIS — I480 Paroxysmal atrial fibrillation: Secondary | ICD-10-CM | POA: Diagnosis present

## 2024-12-10 DIAGNOSIS — Z01812 Encounter for preprocedural laboratory examination: Secondary | ICD-10-CM

## 2024-12-10 DIAGNOSIS — Z01818 Encounter for other preprocedural examination: Secondary | ICD-10-CM | POA: Diagnosis present

## 2024-12-10 LAB — BASIC METABOLIC PANEL WITH GFR
Anion gap: 13 (ref 5–15)
BUN: 21 mg/dL (ref 8–23)
CO2: 21 mmol/L — ABNORMAL LOW (ref 22–32)
Calcium: 8.9 mg/dL (ref 8.9–10.3)
Chloride: 108 mmol/L (ref 98–111)
Creatinine, Ser: 1.23 mg/dL (ref 0.61–1.24)
GFR, Estimated: 59 mL/min — ABNORMAL LOW
Glucose, Bld: 91 mg/dL (ref 70–99)
Potassium: 4.5 mmol/L (ref 3.5–5.1)
Sodium: 142 mmol/L (ref 135–145)

## 2024-12-10 LAB — CBC
HCT: 41.7 % (ref 39.0–52.0)
Hemoglobin: 12.6 g/dL — ABNORMAL LOW (ref 13.0–17.0)
MCH: 24.1 pg — ABNORMAL LOW (ref 26.0–34.0)
MCHC: 30.2 g/dL (ref 30.0–36.0)
MCV: 79.9 fL — ABNORMAL LOW (ref 80.0–100.0)
Platelets: 254 K/uL (ref 150–400)
RBC: 5.22 MIL/uL (ref 4.22–5.81)
RDW: 16.8 % — ABNORMAL HIGH (ref 11.5–15.5)
WBC: 6.3 K/uL (ref 4.0–10.5)
nRBC: 0 % (ref 0.0–0.2)

## 2024-12-11 ENCOUNTER — Ambulatory Visit: Payer: Self-pay | Admitting: Cardiovascular Disease

## 2024-12-11 ENCOUNTER — Telehealth (HOSPITAL_COMMUNITY): Payer: Self-pay

## 2024-12-11 NOTE — Telephone Encounter (Signed)
 Attempted to reach patient to discuss upcoming procedure, no answer. Left VM for patient to return call.

## 2024-12-17 ENCOUNTER — Encounter: Payer: Self-pay | Admitting: Cardiology

## 2024-12-17 ENCOUNTER — Ambulatory Visit: Attending: Cardiology | Admitting: Cardiology

## 2024-12-17 ENCOUNTER — Other Ambulatory Visit (HOSPITAL_BASED_OUTPATIENT_CLINIC_OR_DEPARTMENT_OTHER): Payer: Self-pay

## 2024-12-17 VITALS — BP 148/85 | HR 70 | Ht 71.0 in | Wt 224.0 lb

## 2024-12-17 DIAGNOSIS — I5020 Unspecified systolic (congestive) heart failure: Secondary | ICD-10-CM

## 2024-12-17 DIAGNOSIS — I4819 Other persistent atrial fibrillation: Secondary | ICD-10-CM

## 2024-12-17 DIAGNOSIS — I25119 Atherosclerotic heart disease of native coronary artery with unspecified angina pectoris: Secondary | ICD-10-CM

## 2024-12-17 MED ORDER — VALSARTAN 80 MG PO TABS
40.0000 mg | ORAL_TABLET | Freq: Every day | ORAL | 3 refills | Status: AC
  Filled 2024-12-17: qty 45, 90d supply, fill #0

## 2024-12-17 MED ORDER — LOSARTAN POTASSIUM 25 MG PO TABS
12.5000 mg | ORAL_TABLET | Freq: Every day | ORAL | 3 refills | Status: DC
  Filled 2024-12-17: qty 45, 90d supply, fill #0

## 2024-12-17 NOTE — Patient Instructions (Addendum)
 Medication Instructions:   STOP Amiodarone    START Valsartan  40 mg  (1/2 tablet) every evening after dinner    Labwork: None today  Testing/Procedures: None today  Follow-Up: 3 months Dr.Mcdowell  Any Other Special Instructions Will Be Listed Below (If Applicable).  If you need a refill on your cardiac medications before your next appointment, please call your pharmacy.

## 2024-12-17 NOTE — Progress Notes (Addendum)
 "    Cardiology Office Note  Date: 12/17/2024   ID: [name], DOB 19-Oct-1941, MRN [id number]  History of Present Illness: [name] is an 84 y.o. male last seen in October 2025.  He is here for a routine visit.  He does not report any palpitations, no orthostatic lightheadedness or syncope.  Has an occasional brief chest discomfort that is nonexertional, atypical for angina.  He has not had to use any nitroglycerin .  St. Jude dual-chamber pacemaker in place with follow-up by [name].  Device interrogation in October 2025 revealed AF burden 77%.  He has been scheduled for atrial fibrillation ablation with [name] later this month and has further questions prior to proceeding.  Amiodarone  has not been effective for rhythm control.  He remains on Eliquis  for stroke prophylaxis, denies any spontaneous bleeding problems.  I did review his recent lab work which is noted below.  I reviewed his echocardiogram from October 2025, LVEF stable in the range of 40 to 45%.  I reviewed his ECG today which shows ventricular pacing with underlying atrial fibrillation.  Physical Exam: VS:  BP (!) 148/85 (BP Location: Left Arm, Cuff Size: Large)   Pulse 70   Ht 5' 11 (1.803 m)   Wt 224 lb (101.6 kg)   BMI 31.24 kg/m , BMI Body mass index is 31.24 kg/m.  Wt Readings from Last 3 Encounters:  12/17/24 224 lb (101.6 kg)  09/06/24 226 lb 12.8 oz (102.9 kg)  09/03/24 225 lb 12.8 oz (102.4 kg)    General: Patient appears comfortable at rest. HEENT: Conjunctiva and lids normal. Neck: Supple, no elevated JVP or carotid bruits. Lungs: Clear to auscultation, nonlabored breathing at rest. Cardiac: Regular rate and rhythm, no S3, 1/6 systolic murmur. Extremities: No pitting edema.  ECG:  An ECG dated 09/03/2024 was personally reviewed today and demonstrated:  Ventricular pacing with underlying atrial fibrillation.  Labwork: August 2025: Cholesterol 145, triglycerides 98, HDL 50, LDL 77, TSH  2.38 8/87/7973: BUN 21; Creatinine, Ser 1.23; Hemoglobin 12.6; Platelets 254; Potassium 4.5; Sodium 142   Other Studies Reviewed Today:  Echocardiogram 09/17/2024:  1. Left ventricular ejection fraction, by estimation, is 40 to 45%. The  left ventricle has mildly decreased function. The left ventricle  demonstrates global hypokinesis. There is moderate left ventricular  hypertrophy. Left ventricular diastolic  parameters are indeterminate.   2. Right ventricular systolic function is normal. The right ventricular  size is normal. There is normal pulmonary artery systolic pressure.   3. The mitral valve is normal in structure. No evidence of mitral valve  regurgitation. No evidence of mitral stenosis.   4. The tricuspid valve is abnormal.   5. The aortic valve is tricuspid. Aortic valve regurgitation is not  visualized. No aortic stenosis is present.   6. There is mild dilatation of the ascending aorta, measuring 41 mm.   7. The inferior vena cava is normal in size with greater than 50%  respiratory variability, suggesting right atrial pressure of 3 mmHg.   Assessment and Plan:  1.  CAD status post DES to the RCA with staged DES to the mid LAD and DES to the proximal to mid circumflex in March 2022.  No escalating angina or increasing nitroglycerin  use.  He is not on antiplatelet therapy given concurrent use of Eliquis .  Continue statin therapy.   2.  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  LVEF stable  in the range of 40 to 45% by follow-up echocardiogram in October 2025.  GDMT limited, we are going to try to get him back on ARB in the form of valsartan  40 mg (rather than Cozaar  since possible cross reaction with corn allergy) once in the evening given more recent blood pressure trends.  No diuretics at this time.   3.  Persistent atrial fibrillation with CHA2DS2-VASc score of 3.  He has had increasing  rhythm burden based on device interrogations and has failed amiodarone  for rhythm control strategy.  Tentatively scheduled for atrial fibrillation ablation with [name] on the 30th of this month, but has questions about this and would like to speak with the EP team if possible.  He continues on Eliquis  5 mg twice daily for stroke prophylaxis.  I plan to stop his amiodarone  at this point.  His chronic fatigue is likely multifactorial and not specifically related to atrial fibrillation, could possibly consider a strategy of rate rather than rhythm control.   4.  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.  Disposition:  Follow up 3 months.  Signed, [name], M.D., F.A.C.C. [facility] at [facility]

## 2024-12-17 NOTE — Addendum Note (Signed)
 Addended by: Alexandro Line A on: 12/17/2024 11:40 AM   Modules accepted: Orders

## 2024-12-18 ENCOUNTER — Telehealth: Payer: Self-pay | Admitting: Cardiovascular Disease

## 2024-12-18 NOTE — Telephone Encounter (Signed)
 Spoke with the patient who states that he would like to cancel his ablation with [name]. He states that it is too much for him right now. He saw Dr. Debera yesterday and discussed this with him. His amiodarone  was discontinued. Will send to [name] to advise on any further recommendations since he has decided not to proceed with an ablation.

## 2024-12-18 NOTE — Telephone Encounter (Signed)
 Pt would like to cancel 1/30 ablation. Please advise

## 2024-12-20 ENCOUNTER — Ambulatory Visit

## 2024-12-20 DIAGNOSIS — I428 Other cardiomyopathies: Secondary | ICD-10-CM

## 2024-12-20 LAB — CUP PACEART REMOTE DEVICE CHECK
Battery Remaining Longevity: 106 mo
Battery Remaining Percentage: 91 %
Battery Voltage: 3.01 V
Brady Statistic AP VP Percent: 95 %
Brady Statistic AP VS Percent: 1 %
Brady Statistic AS VP Percent: 5.4 %
Brady Statistic AS VS Percent: 1 %
Brady Statistic RA Percent Paced: 8.3 %
Brady Statistic RV Percent Paced: 99 %
Date Time Interrogation Session: 20260122020014
Implantable Lead Connection Status: 753985
Implantable Lead Connection Status: 753985
Implantable Lead Implant Date: 20001120
Implantable Lead Implant Date: 20250122
Implantable Lead Location: 753859
Implantable Lead Location: 753860
Implantable Pulse Generator Implant Date: 20250122
Lead Channel Impedance Value: 380 Ohm
Lead Channel Impedance Value: 450 Ohm
Lead Channel Pacing Threshold Amplitude: 0.625 V
Lead Channel Pacing Threshold Amplitude: 0.75 V
Lead Channel Pacing Threshold Pulse Width: 0.5 ms
Lead Channel Pacing Threshold Pulse Width: 0.5 ms
Lead Channel Sensing Intrinsic Amplitude: 12 mV
Lead Channel Sensing Intrinsic Amplitude: 5 mV
Lead Channel Setting Pacing Amplitude: 0.875
Lead Channel Setting Pacing Amplitude: 2.5 V
Lead Channel Setting Pacing Pulse Width: 0.5 ms
Lead Channel Setting Sensing Sensitivity: 4 mV
Pulse Gen Model: 2272
Pulse Gen Serial Number: 8233270

## 2024-12-22 NOTE — Progress Notes (Signed)
 Remote PPM Transmission

## 2024-12-25 ENCOUNTER — Ambulatory Visit: Payer: Self-pay | Admitting: Cardiovascular Disease

## 2024-12-28 ENCOUNTER — Encounter (HOSPITAL_COMMUNITY): Admission: RE | Payer: Self-pay | Source: Home / Self Care

## 2024-12-28 ENCOUNTER — Ambulatory Visit (HOSPITAL_COMMUNITY): Admission: RE | Admit: 2024-12-28 | Source: Home / Self Care | Admitting: Cardiovascular Disease

## 2024-12-28 SURGERY — ATRIAL FIBRILLATION ABLATION
Anesthesia: General

## 2025-03-13 ENCOUNTER — Ambulatory Visit: Admitting: Cardiology

## 2025-03-21 ENCOUNTER — Encounter

## 2025-06-20 ENCOUNTER — Encounter

## 2025-09-19 ENCOUNTER — Encounter
